# Patient Record
Sex: Male | Born: 1935 | Race: White | Hispanic: No | Smoking: Former smoker
Health system: Southern US, Community
[De-identification: ages and names within clinical notes are randomized; demographics above are authoritative.]

## PROBLEM LIST (undated history)

## (undated) DIAGNOSIS — G43909 Migraine, unspecified, not intractable, without status migrainosus: Secondary | ICD-10-CM

## (undated) DIAGNOSIS — J449 Chronic obstructive pulmonary disease, unspecified: Secondary | ICD-10-CM

## (undated) DIAGNOSIS — E785 Hyperlipidemia, unspecified: Secondary | ICD-10-CM

## (undated) DIAGNOSIS — R55 Syncope and collapse: Secondary | ICD-10-CM

## (undated) DIAGNOSIS — H9190 Unspecified hearing loss, unspecified ear: Secondary | ICD-10-CM

## (undated) DIAGNOSIS — I10 Essential (primary) hypertension: Secondary | ICD-10-CM

## (undated) DIAGNOSIS — J189 Pneumonia, unspecified organism: Secondary | ICD-10-CM

## (undated) DIAGNOSIS — R5383 Other fatigue: Secondary | ICD-10-CM

## (undated) DIAGNOSIS — F32A Depression, unspecified: Secondary | ICD-10-CM

## (undated) DIAGNOSIS — F329 Major depressive disorder, single episode, unspecified: Secondary | ICD-10-CM

## (undated) DIAGNOSIS — R002 Palpitations: Secondary | ICD-10-CM

## (undated) DIAGNOSIS — Z8719 Personal history of other diseases of the digestive system: Secondary | ICD-10-CM

## (undated) DIAGNOSIS — M549 Dorsalgia, unspecified: Secondary | ICD-10-CM

## (undated) DIAGNOSIS — I4819 Other persistent atrial fibrillation: Secondary | ICD-10-CM

## (undated) DIAGNOSIS — R569 Unspecified convulsions: Secondary | ICD-10-CM

## (undated) HISTORY — DX: Personal history of other diseases of the digestive system: Z87.19

## (undated) HISTORY — PX: OTHER SURGICAL HISTORY: SHX169

## (undated) HISTORY — PX: CATARACT EXTRACTION: SUR2

## (undated) HISTORY — PX: APPENDECTOMY: SHX54

## (undated) HISTORY — PX: HERNIA REPAIR: SHX51

## (undated) HISTORY — DX: Other persistent atrial fibrillation: I48.19

## (undated) HISTORY — PX: BACK SURGERY: SHX140

## (undated) HISTORY — DX: Other fatigue: R53.83

---

## 1974-01-08 DIAGNOSIS — Z8719 Personal history of other diseases of the digestive system: Secondary | ICD-10-CM

## 1974-01-08 HISTORY — DX: Personal history of other diseases of the digestive system: Z87.19

## 1999-10-12 ENCOUNTER — Encounter: Payer: Self-pay | Admitting: Emergency Medicine

## 1999-10-12 ENCOUNTER — Emergency Department (HOSPITAL_COMMUNITY): Admission: EM | Admit: 1999-10-12 | Discharge: 1999-10-12 | Payer: Self-pay | Admitting: Emergency Medicine

## 2002-01-22 ENCOUNTER — Emergency Department (HOSPITAL_COMMUNITY): Admission: EM | Admit: 2002-01-22 | Discharge: 2002-01-22 | Payer: Self-pay | Admitting: Emergency Medicine

## 2006-02-26 ENCOUNTER — Emergency Department (HOSPITAL_COMMUNITY): Admission: EM | Admit: 2006-02-26 | Discharge: 2006-02-26 | Payer: Self-pay | Admitting: Emergency Medicine

## 2006-06-22 ENCOUNTER — Emergency Department (HOSPITAL_COMMUNITY): Admission: EM | Admit: 2006-06-22 | Discharge: 2006-06-22 | Payer: Self-pay | Admitting: Emergency Medicine

## 2007-09-09 ENCOUNTER — Emergency Department: Payer: Self-pay | Admitting: Emergency Medicine

## 2009-05-15 ENCOUNTER — Emergency Department (HOSPITAL_COMMUNITY): Admission: EM | Admit: 2009-05-15 | Discharge: 2009-05-15 | Payer: Self-pay | Admitting: Emergency Medicine

## 2009-05-17 ENCOUNTER — Inpatient Hospital Stay (HOSPITAL_COMMUNITY): Admission: EM | Admit: 2009-05-17 | Discharge: 2009-05-19 | Payer: Self-pay | Admitting: Emergency Medicine

## 2010-03-28 LAB — CBC
HCT: 34.9 % — ABNORMAL LOW (ref 39.0–52.0)
Hemoglobin: 13.8 g/dL (ref 13.0–17.0)
Hemoglobin: 11.7 g/dL — ABNORMAL LOW (ref 13.0–17.0)
MCHC: 33.5 g/dL (ref 30.0–36.0)
MCHC: 33.8 g/dL (ref 30.0–36.0)
MCV: 90.1 fL (ref 78.0–100.0)
MCV: 90.5 fL (ref 78.0–100.0)
MCV: 90.8 fL (ref 78.0–100.0)
Platelets: 140 K/uL — ABNORMAL LOW (ref 150–400)
Platelets: 156 10*3/uL (ref 150–400)
Platelets: 157 10*3/uL (ref 150–400)
RBC: 3.86 MIL/uL — ABNORMAL LOW (ref 4.22–5.81)
RDW: 13.6 % (ref 11.5–15.5)
RDW: 13.7 % (ref 11.5–15.5)
RDW: 14.1 % (ref 11.5–15.5)
RDW: 14.4 % (ref 11.5–15.5)
WBC: 6.2 K/uL (ref 4.0–10.5)

## 2010-03-28 LAB — DIFFERENTIAL
Basophils Absolute: 0 10*3/uL (ref 0.0–0.1)
Basophils Absolute: 0 K/uL (ref 0.0–0.1)
Basophils Relative: 0 % (ref 0–1)
Basophils Relative: 0 % (ref 0–1)
Eosinophils Absolute: 0 10*3/uL (ref 0.0–0.7)
Eosinophils Absolute: 0 10*3/uL (ref 0.0–0.7)
Eosinophils Absolute: 0 K/uL (ref 0.0–0.7)
Eosinophils Relative: 0 % (ref 0–5)
Eosinophils Relative: 0 % (ref 0–5)
Lymphocytes Relative: 11 % — ABNORMAL LOW (ref 12–46)
Lymphocytes Relative: 12 % (ref 12–46)
Lymphocytes Relative: 17 % (ref 12–46)
Lymphs Abs: 0.4 10*3/uL — ABNORMAL LOW (ref 0.7–4.0)
Lymphs Abs: 1 K/uL (ref 0.7–4.0)
Monocytes Absolute: 0.5 K/uL (ref 0.1–1.0)
Monocytes Relative: 9 % (ref 3–12)
Monocytes Relative: 6 % (ref 3–12)
Monocytes Relative: 7 % (ref 3–12)
Neutro Abs: 4.7 K/uL (ref 1.7–7.7)
Neutrophils Relative %: 80 % — ABNORMAL HIGH (ref 43–77)
Neutrophils Relative %: 76 % (ref 43–77)

## 2010-03-28 LAB — BLOOD GAS, ARTERIAL
Acid-base deficit: 1.7 mmol/L (ref 0.0–2.0)
Drawn by: 103701
O2 Content: 2 L/min
O2 Saturation: 97.6 %
pO2, Arterial: 107 mmHg — ABNORMAL HIGH (ref 80.0–100.0)

## 2010-03-28 LAB — BASIC METABOLIC PANEL WITH GFR
BUN: 15 mg/dL (ref 6–23)
CO2: 24 meq/L (ref 19–32)
Calcium: 8 mg/dL — ABNORMAL LOW (ref 8.4–10.5)
Chloride: 102 meq/L (ref 96–112)
Creatinine, Ser: 1.05 mg/dL (ref 0.4–1.5)
GFR calc non Af Amer: >60 mL/min
Glucose, Bld: 104 mg/dL — ABNORMAL HIGH (ref 70–99)
Potassium: 3.9 meq/L (ref 3.5–5.1)
Sodium: 135 meq/L (ref 135–145)

## 2010-03-28 LAB — CSF CELL COUNT WITH DIFFERENTIAL
RBC Count, CSF: 39 /mm3 — ABNORMAL HIGH
RBC Count, CSF: 43 /mm3 — ABNORMAL HIGH
Tube #: 4
WBC, CSF: 0 /mm3 (ref 0–5)
WBC, CSF: 1 /mm3 (ref 0–5)

## 2010-03-28 LAB — URINALYSIS, ROUTINE W REFLEX MICROSCOPIC
Glucose, UA: 100 mg/dL — AB
Hgb urine dipstick: NEGATIVE
Hgb urine dipstick: NEGATIVE
Ketones, ur: NEGATIVE mg/dL
Leukocytes, UA: NEGATIVE
Nitrite: NEGATIVE
Protein, ur: NEGATIVE mg/dL
Specific Gravity, Urine: 1.015 (ref 1.005–1.030)
Urobilinogen, UA: 1 mg/dL (ref 0.0–1.0)
Urobilinogen, UA: 0.2 mg/dL (ref 0.0–1.0)

## 2010-03-28 LAB — TOXOPLASMA ANTIBODY-IGG, CSF: Toxoplasma Gondii IgG: <0.9

## 2010-03-28 LAB — PHENYTOIN LEVEL, TOTAL
Phenytoin Lvl: 10.3 ug/mL (ref 10.0–20.0)
Phenytoin Lvl: 15.5 ug/mL (ref 10.0–20.0)

## 2010-03-28 LAB — URINE CULTURE
Colony Count: NO GROWTH
Colony Count: NO GROWTH
Culture: NO GROWTH
Culture: NO GROWTH
Culture: NO GROWTH

## 2010-03-28 LAB — CULTURE, BLOOD (ROUTINE X 2)
Culture: NO GROWTH
Culture: NO GROWTH

## 2010-03-28 LAB — COMPREHENSIVE METABOLIC PANEL
ALT: 17 U/L (ref 0–53)
AST: 26 U/L (ref 0–37)
AST: 98 U/L — ABNORMAL HIGH (ref 0–37)
Albumin: 3 g/dL — ABNORMAL LOW (ref 3.5–5.2)
Alkaline Phosphatase: 113 U/L (ref 39–117)
Alkaline Phosphatase: 128 U/L — ABNORMAL HIGH (ref 39–117)
BUN: 9 mg/dL (ref 6–23)
BUN: 12 mg/dL (ref 6–23)
Calcium: 8.5 mg/dL (ref 8.4–10.5)
Creatinine, Ser: 1.12 mg/dL (ref 0.4–1.5)
Creatinine, Ser: 1.14 mg/dL (ref 0.4–1.5)
GFR calc Af Amer: >60 mL/min (ref >60)
GFR calc non Af Amer: >60 mL/min (ref >60)
Glucose, Bld: 122 mg/dL — ABNORMAL HIGH (ref 70–99)
Potassium: 3.6 mEq/L (ref 3.5–5.1)
Potassium: 3.7 mEq/L (ref 3.5–5.1)
Total Bilirubin: 0.3 mg/dL (ref 0.3–1.2)
Total Protein: 7.3 g/dL (ref 6.0–8.3)
Total Protein: 6.1 g/dL (ref 6.0–8.3)

## 2010-03-28 LAB — CRYPTOCOCCAL ANTIGEN, CSF: Crypto Ag: NEGATIVE

## 2010-03-28 LAB — LYME DISEASE DNA BY PCR(BORRELIA BURG): Lyme Disease(B.burgdorferi)PCR: NOT DETECTED

## 2010-03-28 LAB — HEPATITIS PANEL, ACUTE
HCV Ab: NEGATIVE
Hep A IgM: NEGATIVE
Hep B C IgM: NEGATIVE
Hepatitis B Surface Ag: NEGATIVE

## 2010-03-28 LAB — LACTIC ACID, PLASMA: Lactic Acid, Venous: 1.5 mmol/L (ref 0.5–2.2)

## 2010-03-28 LAB — PROTEIN AND GLUCOSE, CSF
Glucose, CSF: 90 mg/dL — ABNORMAL HIGH (ref 43–76)
Total  Protein, CSF: 24 mg/dL (ref 15–45)

## 2010-03-28 LAB — GRAM STAIN: Gram Stain: NONE SEEN

## 2010-03-28 LAB — ROCKY MTN SPOTTED FVR AB, IGM-BLOOD: RMSF IgM: 0.21 IV (ref 0.00–0.89)

## 2010-03-28 LAB — BASIC METABOLIC PANEL: GFR calc Af Amer: >60 mL/min (ref >60)

## 2010-10-26 LAB — POCT I-STAT CREATININE: Operator id: 151321

## 2010-10-26 LAB — I-STAT 8, (EC8 V) (CONVERTED LAB)
Bicarbonate: 23.3
Glucose, Bld: 135 — ABNORMAL HIGH
Potassium: 3.6
TCO2: 24
pCO2, Ven: 33.3 — ABNORMAL LOW
pH, Ven: 7.452 — ABNORMAL HIGH

## 2011-01-18 ENCOUNTER — Emergency Department (HOSPITAL_COMMUNITY)
Admission: EM | Admit: 2011-01-18 | Discharge: 2011-01-18 | Disposition: A | Discharge status: Home / Self Care | Payer: Medicare HMO | Attending: Emergency Medicine | Admitting: Emergency Medicine

## 2011-01-18 ENCOUNTER — Encounter (HOSPITAL_COMMUNITY): Payer: Self-pay | Admitting: Emergency Medicine

## 2011-01-18 DIAGNOSIS — I10 Essential (primary) hypertension: Secondary | ICD-10-CM | POA: Insufficient documentation

## 2011-01-18 DIAGNOSIS — Z79899 Other long term (current) drug therapy: Secondary | ICD-10-CM | POA: Insufficient documentation

## 2011-01-18 DIAGNOSIS — R569 Unspecified convulsions: Secondary | ICD-10-CM

## 2011-01-18 DIAGNOSIS — R10819 Abdominal tenderness, unspecified site: Secondary | ICD-10-CM | POA: Insufficient documentation

## 2011-01-18 DIAGNOSIS — G40909 Epilepsy, unspecified, not intractable, without status epilepticus: Secondary | ICD-10-CM | POA: Insufficient documentation

## 2011-01-18 DIAGNOSIS — Z9889 Other specified postprocedural states: Secondary | ICD-10-CM | POA: Insufficient documentation

## 2011-01-18 HISTORY — DX: Essential (primary) hypertension: I10

## 2011-01-18 HISTORY — DX: Chronic obstructive pulmonary disease, unspecified: J44.9

## 2011-01-18 HISTORY — DX: Hyperlipidemia, unspecified: E78.5

## 2011-01-18 HISTORY — DX: Unspecified convulsions: R56.9

## 2011-01-18 LAB — GLUCOSE, CAPILLARY

## 2011-01-18 LAB — PHENYTOIN LEVEL, TOTAL: Phenytoin Lvl: 4.4 ug/mL — ABNORMAL LOW (ref 10.0–20.0)

## 2011-01-18 LAB — CBC
Hemoglobin: 14 g/dL (ref 13.0–17.0)
MCH: 30.4 pg (ref 26.0–34.0)
MCV: 88.9 fL (ref 78.0–100.0)
RBC: 4.6 MIL/uL (ref 4.22–5.81)

## 2011-01-18 LAB — COMPREHENSIVE METABOLIC PANEL
Alkaline Phosphatase: 75 U/L (ref 39–117)
BUN: 12 mg/dL (ref 6–23)
GFR calc Af Amer: 70 mL/min — ABNORMAL LOW (ref >90)
Glucose, Bld: 154 mg/dL — ABNORMAL HIGH (ref 70–99)
Potassium: 3.5 mEq/L (ref 3.5–5.1)
Total Protein: 7.2 g/dL (ref 6.0–8.3)

## 2011-01-18 LAB — DIFFERENTIAL
Eosinophils Absolute: 0 10*3/uL (ref 0.0–0.7)
Eosinophils Relative: 0 % (ref 0–5)
Lymphs Abs: 1 10*3/uL (ref 0.7–4.0)
Monocytes Relative: 7 % (ref 3–12)
Neutrophils Relative %: 83 % — ABNORMAL HIGH (ref 43–77)

## 2011-01-18 MED ORDER — PHENYTOIN SODIUM EXTENDED 100 MG PO CAPS
300.0000 mg | ORAL_CAPSULE | Freq: Once | ORAL | Status: AC
  Administered 2011-01-18: 300 mg via ORAL
  Filled 2011-01-18: qty 3

## 2011-01-18 MED ORDER — ONDANSETRON HCL 4 MG PO TABS: 4.0000 mg | ORAL_TABLET | Freq: Four times a day (QID) | ORAL | Status: AC

## 2011-01-18 NOTE — ED Notes (Signed)
Per ems, that seizure was witnessed by family.  Patient gets stiff with whole body with no convulsions and lastest a minute

## 2011-01-18 NOTE — ED Provider Notes (Addendum)
History     CSN: [id number]  Arrival date & time 01/18/11  4098   First MD Initiated Contact with Patient 01/18/11 1942      Chief Complaint  Patient presents with  . Seizures    (Consider location/radiation/quality/duration/timing/severity/associated sxs/prior treatment) HPI Patient presented to the emergency room after a witnessed seizure by his family. Patient was recently in the hospital and had an elective hernia repair at another medical facility. He had done well with that and was discharged home. Patient has known history of seizure disorder for which she takes Dilantin. The patient was taking some is Phenergan for nausea so that he can take is Percocet tablet. Family noticed that he had his typical aura prior to seizures and then he fell to the ground and had a tonic seizure lasting about a minute. Patient has now recovered and is back to his normal self. He denies any headache. He denies any injuries from the seizure. He has mild tenderness associated with this hernia but he otherwise is feeling well now. Past Medical History  Diagnosis Date  . COPD (chronic obstructive pulmonary disease)   . Hyperlipemia   . Seizure   . Hypertension     Past Surgical History  Procedure Date  . Hernia repair     History reviewed. No pertinent family history.  History  Substance Use Topics  . Smoking status: Former Games developer  . Smokeless tobacco: Former Neurosurgeon  . Alcohol Use: No      Review of Systems  All other systems reviewed and are negative.    Allergies  Aspirin and Codeine  Home Medications   Current Outpatient Rx  Name Route Sig Dispense Refill  . DIAZEPAM 5 MG PO TABS Oral Take 5 mg by mouth every 8 (eight) hours as needed. ANXIETY    . DOCUSATE SODIUM 100 MG PO CAPS Oral Take 100 mg by mouth 2 (two) times daily.    [name] EZETIMIBE-SIMVASTATIN 10-80 MG PO TABS Oral Take 1 tablet by mouth at bedtime.    [name] HYDROCHLOROTHIAZIDE 25 MG PO TABS Oral Take 25 mg by mouth daily.     [name] MIRTAZAPINE 15 MG PO TABS Oral Take 15 mg by mouth at bedtime.    . OXYCODONE HCL 5 MG PO TABS Oral Take 5 mg by mouth every 4 (four) hours as needed. PAIN    . PHENYTOIN SODIUM EXTENDED 100 MG PO CAPS Oral Take 100 mg by mouth daily.    [name] PREGABALIN 75 MG PO CAPS Oral Take 75 mg by mouth 2 (two) times daily.    [name] TIOTROPIUM BROMIDE MONOHYDRATE 18 MCG IN CAPS Inhalation Place 18 mcg into inhaler and inhale daily.    [name] ONDANSETRON HCL 4 MG PO TABS Oral Take 1 tablet (4 mg total) by mouth every 6 (six) hours. 12 tablet 0    BP 151/65  Pulse 83  Temp(Src) 99.8 F (37.7 C) (Oral)  Resp 20  SpO2 96%  Physical Exam  Nursing note and vitals reviewed. Constitutional: He appears well-developed and well-nourished. No distress.  HENT:  Head: Normocephalic and atraumatic.  Right Ear: External ear normal.  Left Ear: External ear normal.  Eyes: Conjunctivae are normal. Right eye exhibits no discharge. Left eye exhibits no discharge. No scleral icterus.  Neck: Neck supple. No tracheal deviation present.  Cardiovascular: Normal rate, regular rhythm and intact distal pulses.   Pulmonary/Chest: Effort normal and breath sounds normal. No stridor. No respiratory distress. He has no wheezes. He has no  rales.  Abdominal: Soft. Bowel sounds are normal. He exhibits no distension. There is tenderness (tenderness around his right inguinal incision, no erythema no discharge). There is no rebound and no guarding.  Musculoskeletal: He exhibits no edema and no tenderness.  Neurological: He is alert. He has normal strength. No sensory deficit. Cranial nerve deficit:  no gross defecits noted. He exhibits normal muscle tone. He displays no seizure activity. Coordination normal.  Skin: Skin is warm and dry. No rash noted.  Psychiatric: He has a normal mood and affect.    ED Course  Procedures (including critical care time)  Medications  hydrochlorothiazide (HYDRODIURIL) 25 MG tablet (not administered)    ezetimibe-simvastatin (VYTORIN) 10-80 MG per tablet (not administered)  phenytoin (DILANTIN) 100 MG ER capsule (not administered)  oxyCODONE (OXY IR/ROXICODONE) 5 MG immediate release tablet (not administered)  pregabalin (LYRICA) 75 MG capsule (not administered)  mirtazapine (REMERON) 15 MG tablet (not administered)  diazepam (VALIUM) 5 MG tablet (not administered)  docusate sodium (COLACE) 100 MG capsule (not administered)  tiotropium (SPIRIVA) 18 MCG inhalation capsule (not administered)  ondansetron (ZOFRAN) 4 MG tablet (not administered)  phenytoin (DILANTIN) ER capsule 300 mg (not administered)    Labs Reviewed  CBC - Abnormal; Notable for the following:    WBC 11.0 (*)    All other components within normal limits  DIFFERENTIAL - Abnormal; Notable for the following:    Neutrophils Relative 83 (*)    Neutro Abs 9.1 (*)    Lymphocytes Relative 10 (*)    All other components within normal limits  COMPREHENSIVE METABOLIC PANEL - Abnormal; Notable for the following:    Glucose, Bld 154 (*)    Albumin 3.3 (*)    GFR calc non Af Amer 61 (*)    GFR calc Af Amer 70 (*)    All other components within normal limits  PHENYTOIN LEVEL, TOTAL - Abnormal; Notable for the following:    Phenytoin Lvl 4.4 (*)    All other components within normal limits  GLUCOSE, CAPILLARY - Abnormal; Notable for the following:    Glucose-Capillary 124 (*)    All other components within normal limits  POCT CBG MONITORING   No results found.   1. Seizure       MDM  Patient has known seizure disorder. His Dilantin level is slightly subtherapeutic. I will give additional dose of Dilantin here in the emergency department. Patient is feeling better now and would like to go home. His family is here with them and they will monitor him closely.  I will also have him discontinue his Phenergan and provided a prescription for Zofran the Phenergan could have been a contributing factor to  seizure.        Celene Kras, MD 01/18/11 1610  Celene Kras, MD 01/18/11 [phone]

## 2011-01-18 NOTE — ED Notes (Signed)
[id number]<AV> Expected date:01/18/11<BR> Expected time: 6:58 PM<BR> Means of arrival:Ambulance<BR> Comments:<BR> EMS 70 GC - seizure

## 2011-01-18 NOTE — ED Notes (Signed)
Per ems, the patient took at home prior to  Seizure - oxycodone and phenergan and then took a valium after the seizure this evening.

## 2011-01-18 NOTE — ED Notes (Signed)
Patient stable upon discharge.  Patient stable at this time

## 2011-05-17 ENCOUNTER — Emergency Department (HOSPITAL_COMMUNITY): Payer: Medicare HMO

## 2011-05-17 ENCOUNTER — Emergency Department (HOSPITAL_COMMUNITY)
Admission: EM | Admit: 2011-05-17 | Discharge: 2011-05-17 | Disposition: A | Discharge status: Home / Self Care | Payer: Medicare HMO | Attending: Emergency Medicine | Admitting: Emergency Medicine

## 2011-05-17 ENCOUNTER — Encounter (HOSPITAL_COMMUNITY): Payer: Self-pay | Admitting: Emergency Medicine

## 2011-05-17 DIAGNOSIS — E876 Hypokalemia: Secondary | ICD-10-CM | POA: Insufficient documentation

## 2011-05-17 DIAGNOSIS — R569 Unspecified convulsions: Secondary | ICD-10-CM

## 2011-05-17 DIAGNOSIS — Z79899 Other long term (current) drug therapy: Secondary | ICD-10-CM | POA: Insufficient documentation

## 2011-05-17 DIAGNOSIS — R112 Nausea with vomiting, unspecified: Secondary | ICD-10-CM | POA: Insufficient documentation

## 2011-05-17 DIAGNOSIS — E785 Hyperlipidemia, unspecified: Secondary | ICD-10-CM | POA: Insufficient documentation

## 2011-05-17 DIAGNOSIS — R51 Headache: Secondary | ICD-10-CM | POA: Insufficient documentation

## 2011-05-17 DIAGNOSIS — G40909 Epilepsy, unspecified, not intractable, without status epilepticus: Secondary | ICD-10-CM | POA: Insufficient documentation

## 2011-05-17 DIAGNOSIS — I10 Essential (primary) hypertension: Secondary | ICD-10-CM | POA: Insufficient documentation

## 2011-05-17 LAB — BASIC METABOLIC PANEL
BUN: 20 mg/dL (ref 6–23)
CO2: 24 mEq/L (ref 19–32)
Chloride: 101 mEq/L (ref 96–112)
Creatinine, Ser: 1.16 mg/dL (ref 0.50–1.35)
Glucose, Bld: 113 mg/dL — ABNORMAL HIGH (ref 70–99)

## 2011-05-17 LAB — CBC
HCT: 42.5 % (ref 39.0–52.0)
MCH: 30.9 pg (ref 26.0–34.0)
MCHC: 34.8 g/dL (ref 30.0–36.0)
MCV: 88.7 fL (ref 78.0–100.0)
RDW: 13.6 % (ref 11.5–15.5)

## 2011-05-17 MED ORDER — LOPERAMIDE HCL 2 MG PO CAPS
2.0000 mg | ORAL_CAPSULE | Freq: Once | ORAL | Status: AC
  Administered 2011-05-17: 2 mg via ORAL
  Filled 2011-05-17: qty 1

## 2011-05-17 MED ORDER — ONDANSETRON HCL 4 MG/2ML IJ SOLN
INTRAMUSCULAR | Status: AC
  Administered 2011-05-17: 02:00:00
  Filled 2011-05-17: qty 2

## 2011-05-17 MED ORDER — POTASSIUM CHLORIDE 20 MEQ/15ML (10%) PO LIQD
ORAL | Status: AC
  Administered 2011-05-17: 40 meq
  Filled 2011-05-17: qty 30

## 2011-05-17 MED ORDER — POTASSIUM CHLORIDE CRYS ER 20 MEQ PO TBCR: 40.0000 meq | EXTENDED_RELEASE_TABLET | Freq: Once | ORAL | Status: DC

## 2011-05-17 MED ORDER — PHENYTOIN SODIUM EXTENDED 100 MG PO CAPS
300.0000 mg | ORAL_CAPSULE | Freq: Once | ORAL | Status: AC
  Administered 2011-05-17: 300 mg via ORAL
  Filled 2011-05-17: qty 3

## 2011-05-17 MED ORDER — SODIUM CHLORIDE 0.9 % IV BOLUS (SEPSIS)
1000.0000 mL | Freq: Once | INTRAVENOUS | Status: AC
  Administered 2011-05-17: 1000 mL via INTRAVENOUS

## 2011-05-17 MED ORDER — LORAZEPAM 2 MG/ML IJ SOLN
2.0000 mg | Freq: Once | INTRAMUSCULAR | Status: AC
  Administered 2011-05-17: 2 mg via INTRAVENOUS

## 2011-05-17 MED ORDER — LORAZEPAM 2 MG/ML IJ SOLN
2.0000 mg | Freq: Once | INTRAMUSCULAR | Status: DC
  Filled 2011-05-17: qty 1

## 2011-05-17 NOTE — ED Notes (Signed)
[id number]<AV> Expected date:05/17/11<BR> Expected time: 1:50 AM<BR> Means of arrival:Ambulance<BR> Comments:<BR> Vomiting, seizures

## 2011-05-17 NOTE — ED Notes (Signed)
Pt was in bed, woke up feeling bad and had 3 seizures. Seizures were reportedly full body for approx. 10 sec each. Pt was not post ictyl. N/V/D on scene. 4mg  of Zofran given in truck. 250cc NSL. LFA 20g IV. Hx of seizures and same like activity per wife. CBG 133.

## 2011-05-17 NOTE — Discharge Instructions (Signed)

## 2011-05-17 NOTE — ED Notes (Signed)
Pt states his eyes have pressure behind them and his head hurts which is not normal for him. They also stated that sometimes when he "over does it" or is sick before hand he is prone to seizing.

## 2011-05-17 NOTE — ED Provider Notes (Signed)
History     CSN: [id number]  Arrival date & time 05/17/11  0217   First MD Initiated Contact with Patient 05/17/11 [phone]      Chief Complaint  Patient presents with  . Seizures  . Emesis  . Diarrhea    (Consider location/radiation/quality/duration/timing/severity/associated sxs/prior treatment) HPI The patient presents with concerns over ongoing seizure activity.  He notes that he has been in his usual state of health.  Approximately 4 hours ago the patient woke up with nausea, vomiting.  He subsequently experienced the first of 6 seizures.  Typically the patient's program does consist of nausea, but he's capable of taking preemptive benzodiazepine.  Types with case, the patient was by mouth intolerance.  Seizures have been witnessed by multiple family members.  Per EMS the patient had seizure activity in route.  On arrival the patient had another seizure-like activity.  On my exam the patient denies confusion, disorientation, does endorse new, atypical pain in his head.  The pain is focally about the posterior orbital areas, throbbing, worse with any motion.  No concurrent nausea with head motion. Notably, the patient has been in his usual state of health, compliant with all medications, and he has no other ongoing medical concerns. Past Medical History  Diagnosis Date  . COPD (chronic obstructive pulmonary disease)   . Hyperlipemia   . Seizure   . Hypertension     Past Surgical History  Procedure Date  . Hernia repair     History reviewed. No pertinent family history.  History  Substance Use Topics  . Smoking status: Former Games developer  . Smokeless tobacco: Former Neurosurgeon  . Alcohol Use: No      Review of Systems  Gastrointestinal: Positive for vomiting. Negative for diarrhea.    Allergies  Aspirin; Codeine; and Oxycodone  Home Medications   Current Outpatient Rx  Name Route Sig Dispense Refill  . AMLODIPINE BESYLATE 10 MG PO TABS Oral Take 10 mg by mouth daily.    [name]  DIAZEPAM 5 MG PO TABS Oral Take 5 mg by mouth every 8 (eight) hours as needed. ANXIETY    . DOCUSATE SODIUM 100 MG PO CAPS Oral Take 100 mg by mouth 2 (two) times daily as needed. FOR STOOL SOFTNER    . EZETIMIBE-SIMVASTATIN 10-80 MG PO TABS Oral Take 1 tablet by mouth every evening.     [name] HYDROCHLOROTHIAZIDE 25 MG PO TABS Oral Take 25 mg by mouth daily.    [name] MIRTAZAPINE 15 MG PO TABS Oral Take 15 mg by mouth at bedtime.    [name] PREGABALIN 75 MG PO CAPS Oral Take 75 mg by mouth 2 (two) times daily.    [name] TIOTROPIUM BROMIDE MONOHYDRATE 18 MCG IN CAPS Inhalation Place 18 mcg into inhaler and inhale daily.      BP 134/65  Pulse 80  Temp(Src) 97.5 F (36.4 C) (Oral)  Resp 14  SpO2 100%  Physical Exam  Nursing note and vitals reviewed. Constitutional: He is oriented to person, place, and time. He appears well-developed. No distress.  HENT:  Head: Normocephalic and atraumatic.  Eyes: Conjunctivae and EOM are normal.  Cardiovascular: Normal rate and regular rhythm.   Pulmonary/Chest: Effort normal. No stridor. No respiratory distress.  Abdominal: He exhibits no distension.  Musculoskeletal: He exhibits no edema.  Neurological: He is alert and oriented to person, place, and time.  Skin: Skin is warm and dry.  Psychiatric: He has a normal mood and affect.    ED Course  Procedures (  including critical care time)  Labs Reviewed  CBC - Abnormal; Notable for the following:    WBC 14.6 (*)    All other components within normal limits  PHENYTOIN LEVEL, TOTAL  BASIC METABOLIC PANEL   No results found.   No diagnosis found.  Cardiac 80 sinus rhythm normal Pulse oximetry 100% [place] abnormal  6:50 AM The patient notes that he is feeling significantly improved.  No further seizure activity. MDM  This elderly male with history of seizures now presents following multiple witnessed seizures, and several episodes of vomiting and diarrhea.  On my initial exam the patient is not post ictal, is  comfortable, though tired appearing.  The patient's labs are notable for demonstration of hypokalemia and a subtherapeutic Dilantin level.  I discussed all findings with the patient and his family.  Following provision of potassium supplements, the patient noted he felt significantly better.  The patient was provided Ativan and Dilantin loading dose.  Given the otherwise reassuring evaluation, the patient's coaptation family members, he was discharged in stable condition with explicit instructions to take his medication as directed, time I will recheck properly and to follow up with his primary care physician as soon as possible.     Gerhard Munch, MD 05/17/11 [phone]

## 2011-05-17 NOTE — ED Notes (Signed)
Patient transported to CT 

## 2013-11-23 ENCOUNTER — Emergency Department: Payer: Self-pay | Admitting: Emergency Medicine

## 2013-11-23 LAB — BASIC METABOLIC PANEL
ANION GAP: 8 (ref 7–16)
BUN: 17 mg/dL (ref 7–18)
Calcium, Total: 8.4 mg/dL — ABNORMAL LOW (ref 8.5–10.1)
Chloride: 101 mmol/L (ref 98–107)
Co2: 32 mmol/L (ref 21–32)
Creatinine: 1.16 mg/dL (ref 0.60–1.30)
EGFR (African American): >60
GLUCOSE: 94 mg/dL (ref 65–99)
Osmolality: 283 (ref 275–301)
Potassium: 3.2 mmol/L — ABNORMAL LOW (ref 3.5–5.1)
Sodium: 141 mmol/L (ref 136–145)

## 2013-11-23 LAB — CBC
HCT: 45.4 % (ref 40.0–52.0)
HGB: 15.3 g/dL (ref 13.0–18.0)
MCH: 31.8 pg (ref 26.0–34.0)
MCHC: 33.7 g/dL (ref 32.0–36.0)
MCV: 95 fL (ref 80–100)
Platelet: 298 10*3/uL (ref 150–440)
RBC: 4.8 10*6/uL (ref 4.40–5.90)
RDW: 14.9 % — AB (ref 11.5–14.5)
WBC: 7.1 10*3/uL (ref 3.8–10.6)

## 2014-04-12 ENCOUNTER — Encounter (HOSPITAL_COMMUNITY): Payer: Self-pay | Admitting: Emergency Medicine

## 2014-04-12 ENCOUNTER — Emergency Department (HOSPITAL_COMMUNITY): Payer: Commercial Managed Care - HMO

## 2014-04-12 ENCOUNTER — Emergency Department (HOSPITAL_COMMUNITY)
Admission: EM | Admit: 2014-04-12 | Discharge: 2014-04-13 | Disposition: A | Discharge status: Home / Self Care | Payer: Commercial Managed Care - HMO | Attending: Emergency Medicine | Admitting: Emergency Medicine

## 2014-04-12 DIAGNOSIS — S20212A Contusion of left front wall of thorax, initial encounter: Secondary | ICD-10-CM

## 2014-04-12 DIAGNOSIS — G40909 Epilepsy, unspecified, not intractable, without status epilepticus: Secondary | ICD-10-CM | POA: Insufficient documentation

## 2014-04-12 DIAGNOSIS — S3991XA Unspecified injury of abdomen, initial encounter: Secondary | ICD-10-CM | POA: Diagnosis not present

## 2014-04-12 DIAGNOSIS — S21102A Unspecified open wound of left front wall of thorax without penetration into thoracic cavity, initial encounter: Secondary | ICD-10-CM

## 2014-04-12 DIAGNOSIS — E785 Hyperlipidemia, unspecified: Secondary | ICD-10-CM | POA: Insufficient documentation

## 2014-04-12 DIAGNOSIS — Z79899 Other long term (current) drug therapy: Secondary | ICD-10-CM | POA: Diagnosis not present

## 2014-04-12 DIAGNOSIS — Z87891 Personal history of nicotine dependence: Secondary | ICD-10-CM | POA: Insufficient documentation

## 2014-04-12 DIAGNOSIS — W208XXA Other cause of strike by thrown, projected or falling object, initial encounter: Secondary | ICD-10-CM | POA: Diagnosis not present

## 2014-04-12 DIAGNOSIS — J441 Chronic obstructive pulmonary disease with (acute) exacerbation: Secondary | ICD-10-CM | POA: Diagnosis not present

## 2014-04-12 DIAGNOSIS — Y9289 Other specified places as the place of occurrence of the external cause: Secondary | ICD-10-CM | POA: Diagnosis not present

## 2014-04-12 DIAGNOSIS — Y9389 Activity, other specified: Secondary | ICD-10-CM | POA: Diagnosis not present

## 2014-04-12 DIAGNOSIS — Y998 Other external cause status: Secondary | ICD-10-CM | POA: Diagnosis not present

## 2014-04-12 DIAGNOSIS — S29001A Unspecified injury of muscle and tendon of front wall of thorax, initial encounter: Secondary | ICD-10-CM | POA: Diagnosis present

## 2014-04-12 DIAGNOSIS — I10 Essential (primary) hypertension: Secondary | ICD-10-CM | POA: Insufficient documentation

## 2014-04-12 LAB — CBC WITH DIFFERENTIAL/PLATELET
Basophils Absolute: 0.1 K/uL (ref 0.0–0.1)
Basophils Relative: 2 % — ABNORMAL HIGH (ref 0–1)
Eosinophils Absolute: 0.1 K/uL (ref 0.0–0.7)
Eosinophils Relative: 1 % (ref 0–5)
HCT: 38.6 % — ABNORMAL LOW (ref 39.0–52.0)
Hemoglobin: 13.4 g/dL (ref 13.0–17.0)
Lymphocytes Relative: 34 % (ref 12–46)
Lymphs Abs: 1.7 K/uL (ref 0.7–4.0)
MCH: 31.4 pg (ref 26.0–34.0)
MCHC: 34.7 g/dL (ref 30.0–36.0)
MCV: 90.4 fL (ref 78.0–100.0)
Monocytes Absolute: 0.5 K/uL (ref 0.1–1.0)
Monocytes Relative: 10 % (ref 3–12)
Neutro Abs: 2.6 K/uL (ref 1.7–7.7)
Neutrophils Relative %: 53 % (ref 43–77)
Platelets: 268 K/uL (ref 150–400)
RBC: 4.27 MIL/uL (ref 4.22–5.81)
RDW: 13.6 % (ref 11.5–15.5)
WBC: 4.9 K/uL (ref 4.0–10.5)

## 2014-04-12 LAB — I-STAT CHEM 8, ED
BUN: 19 mg/dL (ref 6–23)
Calcium, Ion: 1.11 mmol/L — ABNORMAL LOW (ref 1.13–1.30)
Chloride: 98 mmol/L (ref 96–112)
Creatinine, Ser: 1.2 mg/dL (ref 0.50–1.35)
Glucose, Bld: 125 mg/dL — ABNORMAL HIGH (ref 70–99)
HCT: 42 % (ref 39.0–52.0)
Hemoglobin: 14.3 g/dL (ref 13.0–17.0)
Potassium: 3.3 mmol/L — ABNORMAL LOW (ref 3.5–5.1)
Sodium: 139 mmol/L (ref 135–145)
TCO2: 24 mmol/L (ref 0–100)

## 2014-04-12 MED ORDER — FENTANYL CITRATE 0.05 MG/ML IJ SOLN
50.0000 ug | Freq: Once | INTRAMUSCULAR | Status: AC
  Administered 2014-04-12: 50 ug via INTRAVENOUS
  Filled 2014-04-12: qty 2

## 2014-04-12 MED ORDER — IOHEXOL 300 MG/ML  SOLN: 25.0000 mL | INTRAMUSCULAR | Status: AC

## 2014-04-12 NOTE — ED Provider Notes (Signed)
CSN: [id number]     Arrival date & time 04/12/14  2031 History   First MD Initiated Contact with Patient 04/12/14 2036     Chief Complaint  Patient presents with  . Abdominal Injury     (Consider location/radiation/quality/duration/timing/severity/associated sxs/prior Treatment) HPI Zachary Peterson is a 81 y.o. male with history of COPD, seizure, hypertension, states he was piling was earlier today when top of the pile of rolled down and hit him on the back. Patient states approximately 200 pounds of what hit him on the back. He states it knocked him down he fell forward onto laws ovoid, injuring his left ribs. Patient states that he is having severe pain in the left upper abdomen and left lower ribs. States he is having pain with breathing. Denies any nausea or vomiting. Denies head injury. Denies any trouble urinating. Denies any pain in the pelvis or his legs. Patient is ambulatory.  Past Medical History  Diagnosis Date  . COPD (chronic obstructive pulmonary disease)   . Hyperlipemia   . Seizure   . Hypertension    Past Surgical History  Procedure Laterality Date  . Hernia repair     History reviewed. No pertinent family history. History  Substance Use Topics  . Smoking status: Former Games developer  . Smokeless tobacco: Former Neurosurgeon  . Alcohol Use: No    Review of Systems  Respiratory: Positive for chest tightness and shortness of breath.   Cardiovascular: Positive for chest pain.  Gastrointestinal: Positive for abdominal pain. Negative for nausea and vomiting.  Musculoskeletal: Positive for back pain.  All other systems reviewed and are negative.     Allergies  Aspirin; Codeine; and Oxycodone  Home Medications   Prior to Admission medications   Medication Sig Start Date End Date Taking? Authorizing Provider  amLODipine (NORVASC) 10 MG tablet Take 10 mg by mouth daily.    Historical Provider, MD  diazepam (VALIUM) 5 MG tablet Take 5 mg by mouth every 8 (eight) hours as  needed. ANXIETY    Historical Provider, MD  docusate sodium (COLACE) 100 MG capsule Take 100 mg by mouth 2 (two) times daily as needed. FOR STOOL SOFTNER    Historical Provider, MD  ezetimibe-simvastatin (VYTORIN) 10-80 MG per tablet Take 1 tablet by mouth every evening.     Historical Provider, MD  hydrochlorothiazide (HYDRODIURIL) 25 MG tablet Take 25 mg by mouth daily.    Historical Provider, MD  mirtazapine (REMERON) 15 MG tablet Take 15 mg by mouth at bedtime.    Historical Provider, MD  pregabalin (LYRICA) 75 MG capsule Take 75 mg by mouth 2 (two) times daily.    Historical Provider, MD  tiotropium (SPIRIVA) 18 MCG inhalation capsule Place 18 mcg into inhaler and inhale daily.    Historical Provider, MD   BP 119/63 mmHg  Pulse 76  Temp(Src) 98.5 F (36.9 C)  Resp 15  SpO2 95% Physical Exam  Constitutional: He is oriented to person, place, and time. He appears well-developed and well-nourished. No distress.  HENT:  Head: Normocephalic and atraumatic.  Eyes: Conjunctivae are normal.  Neck: Neck supple.  No midline tenderness to palpation  Cardiovascular: Normal rate, regular rhythm and normal heart sounds.   Pulmonary/Chest: Effort normal. No respiratory distress. He has no wheezes. He has no rales. He exhibits tenderness.  Tender to palpation of the left lower ribs over anterior chest.  Abdominal: Soft. Bowel sounds are normal. He exhibits no distension. There is no tenderness. There is no rebound.  Tenderness to the left upper quadrant, no guarding, no bruising.  Musculoskeletal: He exhibits no edema.  Neurological: He is alert and oriented to person, place, and time. No cranial nerve deficit. Coordination normal.  Skin: Skin is warm and dry.  Nursing note and vitals reviewed.   ED Course  Procedures (including critical care time) Labs Review Labs Reviewed  CBC WITH DIFFERENTIAL/PLATELET - Abnormal; Notable for the following:    HCT 38.6 (*)    Basophils Relative 2 (*)     All other components within normal limits  I-STAT CHEM 8, ED - Abnormal; Notable for the following:    Potassium 3.3 (*)    Glucose, Bld 125 (*)    Calcium, Ion 1.11 (*)    All other components within normal limits    Imaging Review Ct Chest W Contrast  04/13/2014   CLINICAL DATA:  Abdominal injury. Patient was chopping wood outside is home and a stack of wood fell over and landed on the patient. Left chest and abdomen pain.  EXAM: CT CHEST, ABDOMEN, AND PELVIS WITH CONTRAST  TECHNIQUE: Multidetector CT imaging of the chest, abdomen and pelvis was performed following the standard protocol during bolus administration of intravenous contrast.  CONTRAST:  100mL OMNIPAQUE IOHEXOL 300 MG/ML  SOLN  COMPARISON:  None.  FINDINGS: CT CHEST FINDINGS  Normal heart size. Normal caliber thoracic aorta. Great vessel origins are patent. Calcification in the aorta and Coronary arteries. Central pulmonary arteries are well opacified without evidence of significant central pulmonary embolus. Esophagus is decompressed. Small esophageal hiatal hernia. No significant lymphadenopathy in the chest.  Emphysematous changes and scattered fibrosis in the lungs. No focal airspace disease or consolidation. Few scattered calcified granulomas. Mild scarring in the lung apices. No pneumothorax. No pleural effusions. Airways appear patent. There is a nodule in the superior segment of the right lower lung measuring 4 mm in diameter. If the patient is at high risk for bronchogenic carcinoma, follow-up chest CT at 1 year is recommended. If the patient is at low risk, no follow-up is needed. This recommendation follows the consensus statement: Guidelines for Management of Small Pulmonary Nodules Detected on CT Scans: A Statement from the Fleischner Society as published in Radiology 2005; 237:395-400.  CT ABDOMEN AND PELVIS FINDINGS  Mild diffuse fatty infiltration in the liver. Cholelithiasis with multiple stones in the gallbladder. No  gallbladder wall thickening or edema. No bile duct dilatation. The spleen, pancreas, adrenal glands, kidneys, inferior vena cava, and retroperitoneal lymph nodes are unremarkable. Calcification of abdominal aorta without aneurysm. Stomach is not abnormally distended. The second-third portion of the duodenum demonstrates heterogeneous appearance with suggestion of irregular wall thickening. Appearance may indicate duodenal hematoma. Small bowel are unremarkable. Stool-filled colon without abnormal distention or wall thickening. No free or loculated pelvic fluid collections. No abnormal mesenteric fluid collections.  Pelvis: Bladder is distended without wall thickening. Prostate gland is diffusely enlarged with calcification. No free or loculated pelvic fluid collections. No pelvic mass or lymphadenopathy. Appendix is not identified. Postoperative changes consistent with hernia repairs.  Bones: Normal alignment of the thoracic and lumbar spine. Degenerative changes in the lumbar region with degenerative disc disease at L2-3, L3-4, and L4-5 levels. Schmorl's noted L3 superior endplate. Posterior elements appear intact. No acute displaced rib fractures identified. Sternum is nondepressed. Sacrum, pelvis, and hips appear intact.  IMPRESSION: Chest: No acute posttraumatic changes. Emphysematous changes in the lungs. 4 mm nodule in the superior segment right lower lung.  Abdomen: Diffuse fatty infiltration of the  liver. Distension and heterogeneous appearance of the second/ third portion the duodenum suggesting possible duodenal hematoma. No bowel obstruction or mesenteric injury. No evidence of solid organ injury or bowel perforation.   Electronically Signed   By: Burman Nieves M.D.   On: 04/13/2014 00:57   Ct Abdomen Pelvis W Contrast  04/13/2014   CLINICAL DATA:  Abdominal injury. Patient was chopping wood outside is home and a stack of wood fell over and landed on the patient. Left chest and abdomen pain.  EXAM:  CT CHEST, ABDOMEN, AND PELVIS WITH CONTRAST  TECHNIQUE: Multidetector CT imaging of the chest, abdomen and pelvis was performed following the standard protocol during bolus administration of intravenous contrast.  CONTRAST:  OMNIPAQUE IOHEXOL 300 MG/ML  SOLN  COMPARISON:  None.  FINDINGS: CT CHEST FINDINGS  Normal heart size. Normal caliber thoracic aorta. Great vessel origins are patent. Calcification in the aorta and Coronary arteries. Central pulmonary arteries are well opacified without evidence of significant central pulmonary embolus. Esophagus is decompressed. Small esophageal hiatal hernia. No significant lymphadenopathy in the chest.  Emphysematous changes and scattered fibrosis in the lungs. No focal airspace disease or consolidation. Few scattered calcified granulomas. Mild scarring in the lung apices. No pneumothorax. No pleural effusions. Airways appear patent. There is a nodule in the superior segment of the right lower lung measuring 4 mm in diameter. If the patient is at high risk for bronchogenic carcinoma, follow-up chest CT at 1 year is recommended. If the patient is at low risk, no follow-up is needed. This recommendation follows the consensus statement: Guidelines for Management of Small Pulmonary Nodules Detected on CT Scans: A Statement from the Fleischner Society as published in Radiology 2005; 237:395-400.  CT ABDOMEN AND PELVIS FINDINGS  Mild diffuse fatty infiltration in the liver. Cholelithiasis with multiple stones in the gallbladder. No gallbladder wall thickening or edema. No bile duct dilatation. The spleen, pancreas, adrenal glands, kidneys, inferior vena cava, and retroperitoneal lymph nodes are unremarkable. Calcification of abdominal aorta without aneurysm. Stomach is not abnormally distended. The second-third portion of the duodenum demonstrates heterogeneous appearance with suggestion of irregular wall thickening. Appearance may indicate duodenal hematoma. Small bowel are  unremarkable. Stool-filled colon without abnormal distention or wall thickening. No free or loculated pelvic fluid collections. No abnormal mesenteric fluid collections.  Pelvis: Bladder is distended without wall thickening. Prostate gland is diffusely enlarged with calcification. No free or loculated pelvic fluid collections. No pelvic mass or lymphadenopathy. Appendix is not identified. Postoperative changes consistent with hernia repairs.  Bones: Normal alignment of the thoracic and lumbar spine. Degenerative changes in the lumbar region with degenerative disc disease at L2-3, L3-4, and L4-5 levels. Schmorl's noted L3 superior endplate. Posterior elements appear intact. No acute displaced rib fractures identified. Sternum is nondepressed. Sacrum, pelvis, and hips appear intact.  IMPRESSION: Chest: No acute posttraumatic changes. Emphysematous changes in the lungs. 4 mm nodule in the superior segment right lower lung.  Abdomen: Diffuse fatty infiltration of the liver. Distension and heterogeneous appearance of the second/ third portion the duodenum suggesting possible duodenal hematoma. No bowel obstruction or mesenteric injury. No evidence of solid organ injury or bowel perforation.   Electronically Signed   By: Burman Nieves M.D.   On: 04/13/2014 00:57   Dg Chest Portable 1 View  04/12/2014   CLINICAL DATA:  Fall with left-sided chest pain.  Abdominal injury.  EXAM: PORTABLE CHEST - 1 VIEW  COMPARISON:  05/17/2009  FINDINGS: Increased markings at the left base,  suspect mild atelectasis. There is hyperinflation and apical emphysematous change with symmetric biapical pleural thickening. No appreciable fracture. When accounting for skin folds on the left, no pneumothorax. Normal heart size and aortic contours.  IMPRESSION: 1. Mild atelectasis at the left base. 2. COPD.   Electronically Signed   By: Marnee Spring M.D.   On: 04/12/2014 21:35     EKG Interpretation   Date/Time:  Monday April 12 2014  20:44:24 EDT Ventricular Rate:  81 PR Interval:  201 QRS Duration: 108 QT Interval:  401 QTC Calculation: 465 R Axis:   49 Text Interpretation:  Sinus rhythm RSR' in V1 or V2, right VCD or RVH No  significant change since last tracing Confirmed by HARRISON  MD, FORREST  ([id number]) on 04/12/2014 9:03:53 PM      MDM   Final diagnoses:  Rib contusion, left, initial encounter     patient is here after he was hit in the back left of hilum with and after landing on a ball and hitting left ribs. Patient complaining of severe pain to the left lower ribs. Mild tenderness to the left upper abdomen and exam. Patient received 100 mcg of fentanyl per ems, feeling better.   10:30 PM Delay in CT, due to how busy ED is. Pt and family informed. Pt's labs unremarkable. VS normal. Pt stable. Hungry.   2:05 AM Pt's CT showing possible deudenal hematoma. Pt reassessed. No abdominal tenderness on exam at this time. Discussed with Dr. Derrell Lolling, on call for trauma, who advised if pt not having any vomiting, and no severe abdominal pain with given mechanism of injury, most likely not hematoma. I offered pt and his family admission for observation and pain management however pt refused wanting to go home. He was able to ambulate in the hallway with minimal assist although somewhat drowsy. Pt's family at bedside, agree with plan to d/c and will take him home where he will be with his wife. Return precautions carefully discussed including to return immediately if worsening abdominal pain, shortness of breath, nausea, vomiting. Home with norco, zofran. Close follow up with pcp.   Filed Vitals:   04/12/14 2345 04/13/14 0000 04/13/14 0145 04/13/14 0151  BP: 123/62 109/89 126/65   Pulse: 64 63  75  Temp:      Resp:    12  SpO2: 96% 98%  93%     Jaynie Crumble, PA-C 04/13/14 0211  Purvis Sheffield, MD 04/14/14 0005

## 2014-04-12 NOTE — ED Notes (Signed)
Per EMS: Pt from home attempted to get wood from wood pile and the pile fell over onto him.  Pt tried to step to side and fell, c/o left upper quadrant pain and having a harder time breathing.  Pt reports left leg is tender.  Lung sounds clear, EMS clears [facility]. Pt alert and oriented.

## 2014-04-12 NOTE — ED Notes (Signed)
Called CT to notify that pt was ready to go to Ct.

## 2014-04-13 ENCOUNTER — Encounter (HOSPITAL_COMMUNITY): Payer: Self-pay

## 2014-04-13 ENCOUNTER — Emergency Department (HOSPITAL_COMMUNITY): Payer: Commercial Managed Care - HMO

## 2014-04-13 MED ORDER — HYDROCODONE-ACETAMINOPHEN 5-325 MG PO TABS: 1.0000 | ORAL_TABLET | Freq: Four times a day (QID) | ORAL | Status: DC | PRN

## 2014-04-13 MED ORDER — ONDANSETRON 8 MG PO TBDP: 8.0000 mg | ORAL_TABLET | Freq: Three times a day (TID) | ORAL | Status: AC | PRN

## 2014-04-13 MED ORDER — FENTANYL CITRATE 0.05 MG/ML IJ SOLN
50.0000 ug | Freq: Once | INTRAMUSCULAR | Status: AC
  Administered 2014-04-13: 50 ug via INTRAVENOUS
  Filled 2014-04-13: qty 2

## 2014-04-13 MED ORDER — IOHEXOL 300 MG/ML  SOLN
100.0000 mL | Freq: Once | INTRAMUSCULAR | Status: AC | PRN
  Administered 2014-04-13: 100 mL via INTRAVENOUS

## 2014-04-13 NOTE — Discharge Instructions (Signed)
Take tylenol for pain, norco as prescribed as needed for severe pain. Do not take both at the same time since norco contains tylenol. Take zofran for nausea. Ice your ribs. Use spirometer every 2-3 hours throughout the day. If you develop any worsening abdominal pain, nausea, vomiting, any new concerning symptom, return to ER.    Blunt Chest Trauma Blunt chest trauma is an injury caused by a blow to the chest. These chest injuries can be very painful. Blunt chest trauma often results in bruised or broken (fractured) ribs. Most cases of bruised and fractured ribs from blunt chest traumas get better after 1 to 3 weeks of rest and pain medicine. Often, the soft tissue in the chest wall is also injured, causing pain and bruising. Internal organs, such as the heart and lungs, may also be injured. Blunt chest trauma can lead to serious medical problems. This injury requires immediate medical care. CAUSES   Motor vehicle collisions.  Falls.  Physical violence.  Sports injuries. SYMPTOMS   Chest pain. The pain may be worse when you move or breathe deeply.  Shortness of breath.  Lightheadedness.  Bruising.  Tenderness.  Swelling. DIAGNOSIS  Your caregiver will do a physical exam. X-rays may be taken to look for fractures. However, minor rib fractures may not show up on X-rays until a few days after the injury. If a more serious injury is suspected, further imaging tests may be done. This may include ultrasounds, computed tomography (CT) scans, or magnetic resonance imaging (MRI). TREATMENT  Treatment depends on the severity of your injury. Your caregiver may prescribe pain medicines and deep breathing exercises. HOME CARE INSTRUCTIONS  Limit your activities until you can move around without much pain.  Do not do any strenuous work until your injury is healed.  Put ice on the injured area.  Put ice in a plastic bag.  Place a towel between your skin and the bag.  Leave the ice on for  15-20 minutes, 03-04 times a day.  You may wear a rib belt as directed by your caregiver to reduce pain.  Practice deep breathing as directed by your caregiver to keep your lungs clear.  Only take over-the-counter or prescription medicines for pain, fever, or discomfort as directed by your caregiver. SEEK IMMEDIATE MEDICAL CARE IF:   You have increasing pain or shortness of breath.  You cough up blood.  You have nausea, vomiting, or abdominal pain.  You have a fever.  You feel dizzy, weak, or you faint. MAKE SURE YOU:  Understand these instructions.  Will watch your condition.  Will get help right away if you are not doing well or get worse. Document Released: 02/02/2004 Document Revised: 03/19/2011 Document Reviewed: 10/11/2010 [facility] Patient Information 2015 [place], [place]. This information is not intended to replace advice given to you by your health care provider. Make sure you discuss any questions you have with your health care provider.

## 2014-04-13 NOTE — ED Notes (Signed)
Pt taken to CT.

## 2014-04-13 NOTE — ED Notes (Signed)
Pt made aware to return if symptoms worsen or if any life threatening symptoms occur.   

## 2015-12-22 ENCOUNTER — Ambulatory Visit: Payer: Medicare HMO | Attending: Unknown Physician Specialty | Admitting: Physical Therapy

## 2015-12-22 ENCOUNTER — Encounter: Payer: Self-pay | Admitting: Physical Therapy

## 2015-12-22 DIAGNOSIS — M6281 Muscle weakness (generalized): Secondary | ICD-10-CM | POA: Diagnosis present

## 2015-12-22 DIAGNOSIS — M5440 Lumbago with sciatica, unspecified side: Secondary | ICD-10-CM | POA: Insufficient documentation

## 2015-12-22 DIAGNOSIS — R262 Difficulty in walking, not elsewhere classified: Secondary | ICD-10-CM

## 2015-12-22 NOTE — Therapy (Signed)
[facility] [address] [place], [place], [address] Phone: [phone]   Fax:  [phone]  Physical Therapy Evaluation  Patient Details  Name: Zachary RavelSamuel D Peterson MRN: [id number] Date of Birth: [birth year] Referring Provider: Linus SalmonsMILLER, JOSEPH K  Encounter Date: 12/22/2015      PT End of Session - 12/22/15 1120    Visit Number 1   Number of Visits 17   Date for PT Re-Evaluation 02/16/16   Authorization Type g codes   Authorization Time Period 1/10   PT Start Time 1100   PT Stop Time 1200   PT Time Calculation (min) 60 min   Activity Tolerance Patient limited by pain   Behavior During Therapy [facility] for tasks assessed/performed      Past Medical History:  Diagnosis Date  . COPD (chronic obstructive pulmonary disease) (HCC)   . Hyperlipemia   . Hypertension   . Seizure [facility](HCC)     Past Surgical History:  Procedure Laterality Date  . HERNIA REPAIR      There were no vitals filed for this visit.       Subjective Assessment - 12/22/15 1106    Subjective Patient is having back pain for 3 weeks. He does not know what he did to make it hurt. Now the pain is going down his right leg to his ankle   Patient is accompained by: Family member   Pertinent History back surgery 1980's, , high blood pressure, seizures, RA,chronic low backpain   Limitations Sitting;Lifting;Standing;Walking   How long can you sit comfortably? all day   How long can you stand comfortably? a few minutes   How long can you walk comfortably? 5 minutes   Diagnostic tests x ray   Patient Stated Goals to walk better   Currently in Pain? Yes   Pain Score 10-Worst pain ever   Pain Location Back   Pain Orientation Right   Pain Descriptors / Indicators Constant   Pain Type Chronic pain   Pain Radiating Towards RLE ankle   Pain Onset 1 to 4 weeks ago   Pain Frequency Constant   Aggravating Factors  movement, walking, standing   Effect of Pain on Daily  Activities unable to perform walking   Multiple Pain Sites Yes            [facility] Assessment - 12/22/15 0001      Assessment   Medical Diagnosis Lumbar Radiculopathy   Referring Provider Royston SinnerMILLER, JOSEPH K   Onset Date/Surgical Date 12/21/15   Hand Dominance Right   Prior Therapy 1 year ago     Precautions   Precautions None     Restrictions   Weight Bearing Restrictions No     Balance Screen   Has the patient fallen in the past 6 months No   Has the patient had a decrease in activity level because of a fear of falling?  Yes   Is the patient reluctant to leave their home because of a fear of falling?  No     Home Environment   Living Environment Private residence   Available Help at Discharge Family   Type of Home House   Home Access Stairs to enter   Entrance Stairs-Number of Steps 6   Entrance Stairs-Rails Can reach both   Home Layout One level   Home Equipment [place] - single point     Prior Function   Level of Independence Needs assistance with ADLs;Independent with household mobility without  device   Vocation Retired   Leisure outside work     Copy[employer]ognition   Overall Cognitive Status Within [employer] OneFunctional Limits for tasks assessed   Attention Focused       PAIN:10/10 to low back and RLE down to RLE  POSTURE: flexed posture   PROM/AROM:  STRENGTH:  Graded on a 0-5 scale Muscle Group Left Right                          Hip Flex 2/5 2/5  Hip Abd 2/5 2/5  Hip Add 2/5 2/5  Hip Ext 0/5 0/5  Hip IR/ER    Knee Flex 3/5 3/5  Knee Ext 3/5 3/5  Ankle DF    Ankle PF     SENSATION: numbness in RLE  SPECIAL TESTS:= spring test L1- L5 , + RLE prone flex test, + crossed SLR   FUNCTIONAL MOBILITY: guarded and poor mobility   BALANCE:unable to tandem stand and unable to single leg stand  GAIT: Poor posture with flexed trunk and slow gait speed  OUTCOME MEASURES: TEST Outcome Interpretation  5 times sit<>stand 47.35sec >60 yo, >15 sec indicates increased  risk for falls  10 meter walk test    .56            m/s <1.0 m/s indicates increased risk for falls; limited community ambulator                    Patient was recommended to continue MH to decrease pain.                      PT Education - 12/22/15 1118    Education provided Yes   Education Details plan of care   Person(s) Educated Patient   Methods Explanation   Comprehension Verbalized understanding             PT Long Term Goals - 12/22/15 1124      PT LONG TERM GOAL #1   Title Patient will be independent in home exercise program to improve strength/mobility for better functional independence with ADLs.   Time 8   Period Weeks   Status New     PT LONG TERM GOAL #2   Title Patient (> seconds indicating an increased LE strength and improved balance.82 years old) will complete five times sit to stand test in < 15    Time 8   Period Weeks   Status New     PT LONG TERM GOAL #3   Title Patient will be able to perform household work/ chores without increase in symptoms.   Time 8   Period Weeks   Status New     PT LONG TERM GOAL #4   Title Patient will report a worst pain of 3/10 on VAS in             to improve tolerance with ADLs and reduced symptoms with activities.    Time 8   Period Weeks   Status New               Plan - 12/22/15 1121    Clinical Impression Statement Patient has low back pain and RLE pain down to his right ankle. He has difficulty waking, standing, sitting and mobility with transfers due to back pain 10/10. He has tenderness to palpation in paraspinal muscles, and right glut muscle   Rehab Potential Fair   Clinical Impairments Affecting Rehab Potential  age, back surgery x 3, steps   PT Frequency 2x / week   PT Duration 8 weeks   PT Treatment/Interventions Moist Heat;Electrical Stimulation;Gait training;Therapeutic activities;Therapeutic exercise;Manual techniques;Dry needling   PT Next Visit Plan manual therapy,  e-stim, heat   Consulted and Agree with Plan of Care Patient;Family member/caregiver      Patient will benefit from skilled therapeutic intervention in order to improve the following deficits and impairments:  Abnormal gait, Pain, Impaired flexibility, Decreased strength, Decreased mobility, Difficulty walking, Impaired sensation, Decreased range of motion  Visit Diagnosis: Acute right-sided low back pain with sciatica, sciatica laterality unspecified  Difficulty in walking, not elsewhere classified  Muscle weakness (generalized)      G-Codes - January 01, 2016 1127    Functional Assessment Tool Used 5 x sit to stand, 10 MW   Functional Limitation Mobility: Walking and moving around   Mobility: Walking and Moving Around Current Status [phone]) At least 80 percent but less than 100 percent impaired, limited or restricted   Mobility: Walking and Moving Around Goal Status [phone]) At least 60 percent but less than 80 percent impaired, limited or restricted       Problem List There are no active problems to display for this patient.  Ezekiel Ina, PT, DPT [place], [place] Bruns S 2016/01/01, 12:03 PM  [place] [facility] MAIN [facility] SERVICES [address] [place], [place], [address] Phone: [phone]   Fax:  [phone]  Name: Zachary Peterson MRN: [id number] Date of Birth: 1931/11/19

## 2015-12-26 ENCOUNTER — Ambulatory Visit: Payer: Medicare HMO

## 2015-12-26 DIAGNOSIS — M5440 Lumbago with sciatica, unspecified side: Secondary | ICD-10-CM | POA: Diagnosis not present

## 2015-12-26 DIAGNOSIS — M6281 Muscle weakness (generalized): Secondary | ICD-10-CM

## 2015-12-26 DIAGNOSIS — R262 Difficulty in walking, not elsewhere classified: Secondary | ICD-10-CM

## 2015-12-26 NOTE — Therapy (Signed)
[place] [facility] [address] [place], [place], [address] Phone: [phone]   Fax:  [phone]  Physical Therapy Treatment  Patient Details  Name: Zachary Peterson MRN: [id number] Date of Birth: 05/17/1931 Referring Provider: Linus SalmonsMILLER, JOSEPH K  Encounter Date: 12/26/2015      PT End of Session - 12/26/15 0959    Visit Number 2   Number of Visits 17   Date for PT Re-Evaluation 02/16/16   Authorization Type g codes   Authorization Time Period 2/10   PT Start Time 0900   PT Stop Time 0948   PT Time Calculation (min) 48 min   Activity Tolerance Patient limited by pain   Behavior During Therapy [facility] for tasks assessed/performed      Past Medical History:  Diagnosis Date  . COPD (chronic obstructive pulmonary disease) (HCC)   . Hyperlipemia   . Hypertension   . Seizure [facility](HCC)     Past Surgical History:  Procedure Laterality Date  . HERNIA REPAIR      There were no vitals filed for this visit.      Subjective Assessment - 12/26/15 0954    Subjective Patient reports he had decreased low back pain yesterday but increased reports increased today. Patient states the pain is intense in his ankle currently.    Patient is accompained by: Family member   Pertinent History back surgery 1980's, , high blood pressure, seizures, RA,chronic low backpain   Limitations Sitting;Lifting;Standing;Walking   How long can you sit comfortably? all day   How long can you stand comfortably? a few minutes   How long can you walk comfortably? 5 minutes   Diagnostic tests x ray   Patient Stated Goals to walk better   Currently in Pain? Yes   Pain Score 10-Worst pain ever   Pain Location Back   Pain Orientation Right   Pain Descriptors / Indicators Constant   Pain Type Chronic pain   Pain Onset 1 to 4 weeks ago   Pain Frequency Constant      TREATMENT: Manual Therapy: STM to the R glute max, piriformis, and glute med area to decrease  muscle spasms and TrP in the area. Grade I-II mobs to L3-S1 to decrease lumbar pain and spasms (Increased pain along L5 segment versus the remaining segments) - 2 x 30 secs each segment  Therapeutic Exercise: Prone lying on pillow under pelvis first 25 min of therapy session - progressed to no pillow under hips to further improve lumbar extension (patient demonstrates no increase in pain afterwards) Prone on elbows - 2 x 8 min while performing manual therapy Hip extension with knee bent and manual assistance from PT - 2 x  10 on R LE only Hip IR/ER in prone with manual assistance from PT - 2 min x 2  Education: Educated heavily on importance of avoiding flexion based postures, performing walking throughout the day (at least 5 min at a time), and spending time on stomach to improve lumbar extension and subsequent decrease in pain.         PT Education - 12/26/15 0958    Education provided Yes   Education Details Educated on neurological physiology/anatomy   Person(s) Educated Patient   Methods Explanation   Comprehension Verbalized understanding             PT Long Term Goals - 12/22/15 1124      PT LONG TERM GOAL #1   Title Patient will be independent  in home exercise program to improve strength/mobility for better functional independence with ADLs.   Time 8   Period Weeks   Status New     PT LONG TERM GOAL #2   Title Patient (> seconds indicating an increased LE strength and improved balance.82 years old) will complete five times sit to stand test in < 15    Time 8   Period Weeks   Status New     PT LONG TERM GOAL #3   Title Patient will be able to perform household work/ chores without increase in symptoms.   Time 8   Period Weeks   Status New     PT LONG TERM GOAL #4   Title Patient will report a worst pain of 3/10 on VAS in             to improve tolerance with ADLs and reduced symptoms with activities.    Time 8   Period Weeks   Status New                Plan - 12/26/15 0959    Clinical Impression Statement Patient demonstated decrease in ankle/glute/back pain after performing exercises in prone indicating improved lumbar AROM and motor control. Performed mobility based exercises to improve lumbar extension to decrease pain. Patient continues to have pain at end of sesion but is significantly decreased. Patient demonstrates improved ease with ambulation at end of session. Patient will benefit from further skilled therapy focused on improving back pain and strengthening to decrease fall risk.    Rehab Potential Fair   Clinical Impairments Affecting Rehab Potential age, back surgery x 3, steps   PT Frequency 2x / week   PT Duration 8 weeks   PT Treatment/Interventions Moist Heat;Electrical Stimulation;Gait training;Therapeutic activities;Therapeutic exercise;Manual techniques;Dry needling   PT Next Visit Plan manual therapy, e-stim, heat   Consulted and Agree with Plan of Care Patient;Family member/caregiver      Patient will benefit from skilled therapeutic intervention in order to improve the following deficits and impairments:  Abnormal gait, Pain, Impaired flexibility, Decreased strength, Decreased mobility, Difficulty walking, Impaired sensation, Decreased range of motion  Visit Diagnosis: Acute right-sided low back pain with sciatica, sciatica laterality unspecified  Difficulty in walking, not elsewhere classified  Muscle weakness (generalized)     Problem List There are no active problems to display for this patient.   Myrene GalasWesley Pete Schnitzer, PT DPT 12/26/2015, 10:12 AM  [place] [facility] MA[facility] [address] [place], [place], [address] Phone: [phone]   Fax:  [phone]  Name: Zachary Peterson MRN: [id number] Date of Birth: 07/11/1931

## 2015-12-28 ENCOUNTER — Encounter: Payer: Self-pay | Admitting: Physical Therapy

## 2015-12-28 ENCOUNTER — Ambulatory Visit: Payer: Medicare HMO | Admitting: Physical Therapy

## 2015-12-28 VITALS — BP 148/63 | HR 93

## 2015-12-28 DIAGNOSIS — M6281 Muscle weakness (generalized): Secondary | ICD-10-CM

## 2015-12-28 DIAGNOSIS — R262 Difficulty in walking, not elsewhere classified: Secondary | ICD-10-CM

## 2015-12-28 DIAGNOSIS — M5440 Lumbago with sciatica, unspecified side: Secondary | ICD-10-CM

## 2015-12-28 NOTE — Therapy (Signed)
[facility] [address] , [place], [address] Phone: [phone]   Fax:  [phone]  Physical Therapy Treatment  Patient Details  Name: Zachary Peterson MRN: [id number] Date of Birth: 1931-03-15 Referring Provider: Linus SalmonsMILLER, JOSEPH K  Encounter Date: 12/28/2015      PT End of Session - 12/28/15 1430    Visit Number 3   Number of Visits 17   Date for PT Re-Evaluation 02/16/16   Authorization Type g codes   Authorization Time Period 3/10   PT Start Time 1431   PT Stop Time 1515   PT Time Calculation (min) 44 min   Activity Tolerance Patient limited by pain   Behavior During Therapy [facility] for tasks assessed/performed      Past Medical History:  Diagnosis Date  . COPD (chronic obstructive pulmonary disease) (HCC)   . Hyperlipemia   . Hypertension   . Seizure [facility](HCC)     Past Surgical History:  Procedure Laterality Date  . HERNIA REPAIR      Vitals:   12/28/15 1435  BP: (!) 148/63  Pulse: 93  SpO2: 97%        Subjective Assessment - 12/28/15 1435    Subjective Pt reports he had some relief the day after his last session but is having pain down his RLE down to his R ankle today.  He has been trying to be more conscious of avoiding flexion activities.     Patient is accompained by: Family member   Pertinent History back surgery 1980's, , high blood pressure, seizures, RA,chronic low backpain   Limitations Sitting;Lifting;Standing;Walking   How long can you sit comfortably? all day   How long can you stand comfortably? a few minutes   How long can you walk comfortably? 5 minutes   Diagnostic tests x ray   Patient Stated Goals to walk better   Currently in Pain? Yes   Pain Score 10-Worst pain ever   Pain Location Leg   Pain Orientation Right   Pain Descriptors / Indicators Aching;Constant   Pain Type Chronic pain   Pain Onset 1 to 4 weeks ago   Multiple Pain Sites No        TREATMENT    Therapeutic Activity prior to Therapeutic Exercise and Manual Therapy:  Taught pt log roll for in and OOB which pt demonstrated with min guard assist and cues for technique. (5 mins)   Manual Therapy:  Pt prone. STM to the R glute max, piriformis, glute med, Bil lumbar paraspinals area to decrease muscle spasms and TrP in the area. Grade I-II mobs to L3-S1 30 sec each segment to decrease lumbar pain and spasms (Increased pain along L5 segment versus the remaining segments)   Therapeutic Exercise:  Pt in prone position from start of session today which pt was able to tolerate.  Prone on elbows x4 mins (pain decreased to 5/10 and pain only down to R knee with this exercise compared to "at least a 10" and down to the R ankle at the start of the session.  +Bil Slump Test but more symptomatic on RLE. Sciatic nerve glide in sitting, pt with upright posture and gentle DF/PF x10  Seated clams with RTB around knees 3x10  Manually resisted seated R hip IR/ER x10 each side  Sit<>stand x10 without use of UEs, pt demonstrated good eccentric control.               PT Education - 12/28/15 1430  Education provided Yes   Education Details Exercise technique, pertinent pathophysiology/anatomy   Person(s) Educated Patient   Methods Demonstration;Tactile cues;Verbal cues;Explanation   Comprehension Verbalized understanding;Returned demonstration;Verbal cues required;Tactile cues required;Need further instruction             PT Long Term Goals - 12/22/15 1124      PT LONG TERM GOAL #1   Title Patient will be independent in home exercise program to improve strength/mobility for better functional independence with ADLs.   Time 8   Period Weeks   Status New     PT LONG TERM GOAL #2   Title Patient (> seconds indicating an increased LE strength and improved balance.82 years old) will complete five times sit to stand test in < 15    Time 8   Period Weeks   Status New     PT LONG TERM  GOAL #3   Title Patient will be able to perform household work/ chores without increase in symptoms.   Time 8   Period Weeks   Status New     PT LONG TERM GOAL #4   Title Patient will report a worst pain of 3/10 on VAS in             to improve tolerance with ADLs and reduced symptoms with activities.    Time 8   Period Weeks   Status New               Plan - 12/28/15 1506    Clinical Impression Statement Pt presents with "more than 10/10" pain down RLE.  Following prone extension exercise and STM pt's pain reduces to 5/10 and only radiates down to knee as compared to down to ankle at start of session.  Following therapeutic exercise, pain down to 3/10.  Pt was educated in log roll technique to protect his back with in/OOB at home.  Pt will benefit from continued skilled PT interventions for improved mobility, strength, balance, and reduced pain.   Rehab Potential Fair   Clinical Impairments Affecting Rehab Potential age, back surgery x 3, steps   PT Frequency 2x / week   PT Duration 8 weeks   PT Treatment/Interventions Moist Heat;Electrical Stimulation;Gait training;Therapeutic activities;Therapeutic exercise;Manual techniques;Dry needling   PT Next Visit Plan manual therapy, e-stim, heat   Consulted and Agree with Plan of Care Patient;Family member/caregiver      Patient will benefit from skilled therapeutic intervention in order to improve the following deficits and impairments:  Abnormal gait, Pain, Impaired flexibility, Decreased strength, Decreased mobility, Difficulty walking, Impaired sensation, Decreased range of motion  Visit Diagnosis: Acute right-sided low back pain with sciatica, sciatica laterality unspecified  Difficulty in walking, not elsewhere classified  Muscle weakness (generalized)     Problem List There are no active problems to display for this patient.   Encarnacion ChuAshley Abashian PT, DPT 12/28/2015, 3:19 PM  [place] [facility] [address] [place], [place], [address] Phone: [phone]   Fax:  [phone]  Name: Zachary Peterson MRN: [id number] Date of Birth: 24-Dec-1931

## 2016-01-04 ENCOUNTER — Encounter: Payer: Self-pay | Admitting: Physical Therapy

## 2016-01-04 ENCOUNTER — Ambulatory Visit: Payer: Medicare HMO

## 2016-01-04 VITALS — BP 154/76 | HR 85

## 2016-01-04 DIAGNOSIS — R262 Difficulty in walking, not elsewhere classified: Secondary | ICD-10-CM

## 2016-01-04 DIAGNOSIS — M5440 Lumbago with sciatica, unspecified side: Secondary | ICD-10-CM

## 2016-01-04 DIAGNOSIS — M6281 Muscle weakness (generalized): Secondary | ICD-10-CM

## 2016-01-04 NOTE — Therapy (Signed)
[place] [facility] [address] [place], [place], [address] Phone: [phone]   Fax:  [phone]  Physical Therapy Treatment  Patient Details  Name: Zachary RavelSamuel D Gopal MRN: [id number] Date of Birth: [birth year] Referring Provider: Linus SalmonsMILLER, JOSEPH K  Encounter Date: 01/04/2016      PT End of Session - 01/04/16 1429    Visit Number 4   Number of Visits 17   Date for PT Re-Evaluation 02/16/16   Authorization Type g codes   Authorization Time Period 4/10   PT Start Time 1400   PT Stop Time 1430   PT Time Calculation (min) 30 min   Activity Tolerance Patient limited by pain   Behavior During Therapy [facility] for tasks assessed/performed      Past Medical History:  Diagnosis Date  . COPD (chronic obstructive pulmonary disease) (HCC)   . Hyperlipemia   . Hypertension   . Seizure [facility](HCC)     Past Surgical History:  Procedure Laterality Date  . HERNIA REPAIR      Vitals:   01/04/16 1405  BP: (!) 154/76  Pulse: 85  SpO2: 100%        Subjective Assessment - 01/04/16 1358    Subjective Pt reports that he is not having any improvement in his back pain. He states that he had approximately 1 hour of relief after last therapy session but otherwise his back pain is the same. He states that he finds it difficult to ambulate at home due to the pain. Reports that laying on his stomach makes his back pain worse with increasind pain down the RLE.   Patient is accompained by: Family member   Pertinent History back surgery 1980's, , high blood pressure, seizures, RA,chronic low backpain   Limitations Sitting;Lifting;Standing;Walking   How long can you sit comfortably? all day   How long can you stand comfortably? a few minutes   How long can you walk comfortably? 5 minutes   Diagnostic tests x ray   Patient Stated Goals to walk better   Currently in Pain? Yes   Pain Score 10-Worst pain ever   Pain Location Leg   Pain Orientation Right    Pain Descriptors / Indicators Aching   Pain Type Chronic pain   Pain Radiating Towards RLE to the ankle   Pain Onset 1 to 4 weeks ago   Pain Frequency Constant   Aggravating Factors  walking, laying on stomach, movement   Effect of Pain on Daily Activities Unable to walk   Multiple Pain Sites No            TREATMENT   Manual Therapy:  Long axis R hip distraction with belt 20s hold, 20 second rest x 5; R hip inferior mobilizations at 90 hip flexion, belt assist, grade III, 30s/bout x 4 bouts; R sciatic nerve glides in SLR position with cervical flexion/extension and ankle DF/PF x 10; L sidelying STM to the R glute max, piriformis, glute med,  MHP applied to R lateral and posterior hip x 5 minutes in L sidelying position (no charge);                      PT Education - 01/04/16 1429    Education provided Yes   Education Details Plan of care   Person(s) Educated Patient   Methods Explanation   Comprehension Verbalized understanding             PT Long Term Goals -  12/22/15 1124      PT LONG TERM GOAL #1   Title Patient will be independent in home exercise program to improve strength/mobility for better functional independence with ADLs.   Time 8   Period Weeks   Status New     PT LONG TERM GOAL #2   Title Patient (> seconds indicating an increased LE strength and improved balance.82 years old) will complete five times sit to stand test in < 15    Time 8   Period Weeks   Status New     PT LONG TERM GOAL #3   Title Patient will be able to perform household work/ chores without increase in symptoms.   Time 8   Period Weeks   Status New     PT LONG TERM GOAL #4   Title Patient will report a worst pain of 3/10 on VAS in             to improve tolerance with ADLs and reduced symptoms with activities.    Time 8   Period Weeks   Status New               Plan - 01/04/16 1429    Clinical Impression Statement Pt presents with very  high levels of pain. Reports that prone positioning peripheralizes his symptoms. However during prior sessions pt demonstrates centralization of symptoms with prone activities. Todays session focused on pain-relieving measures. Pt reports reduction of pain from 10/10 initially to 5/10 at end of session. Pt encouraged to continue HEP. If he cannot find more sustained pain-relieving benefit from therapy after a few more sessions he may be appropriate for discharge.    Rehab Potential Fair   Clinical Impairments Affecting Rehab Potential age, back surgery x 3, steps   PT Frequency 2x / week   PT Duration 8 weeks   PT Treatment/Interventions Moist Heat;Electrical Stimulation;Gait training;Therapeutic activities;Therapeutic exercise;Manual techniques;Dry needling   PT Next Visit Plan manual therapy, e-stim, heat   PT Home Exercise Plan Continue as prescribed   Consulted and Agree with Plan of Care Patient;Family member/caregiver      Patient will benefit from skilled therapeutic intervention in order to improve the following deficits and impairments:  Abnormal gait, Pain, Impaired flexibility, Decreased strength, Decreased mobility, Difficulty walking, Impaired sensation, Decreased range of motion  Visit Diagnosis: Acute right-sided low back pain with sciatica, sciatica laterality unspecified  Difficulty in walking, not elsewhere classified  Muscle weakness (generalized)     Problem List There are no active problems to display for this patient.  Lynnea MaizesJason D Adelina Collard PT, DPT   Camielle Sizer 01/04/2016, 4:12 PM  [place] [facility] [address] [place], [place], [address] Phone: [phone]   Fax:  [phone]  Name: Zachary RavelSamuel D Ly MRN: [id number] Date of Birth: 1931/06/14

## 2016-01-10 ENCOUNTER — Ambulatory Visit: Payer: Medicare HMO | Attending: Unknown Physician Specialty | Admitting: Physical Therapy

## 2016-01-10 ENCOUNTER — Encounter: Payer: Self-pay | Admitting: Physical Therapy

## 2016-01-10 DIAGNOSIS — M6281 Muscle weakness (generalized): Secondary | ICD-10-CM | POA: Diagnosis present

## 2016-01-10 DIAGNOSIS — R262 Difficulty in walking, not elsewhere classified: Secondary | ICD-10-CM | POA: Insufficient documentation

## 2016-01-10 DIAGNOSIS — M5440 Lumbago with sciatica, unspecified side: Secondary | ICD-10-CM

## 2016-01-10 NOTE — Therapy (Addendum)
[place] MAIN [facility] SERVICES [address] [place], [place], [address] Phone: [phone]   Fax:  [phone]  Physical Therapy Treatment/DC Summary  Patient Details  Name: Zachary Peterson MRN: [id number] Date of Birth: 08/07/1931 Referring Provider: Jacques Navy  Encounter Date: 01/10/2016      PT End of Session - 01/10/16 1131    Visit Number 5   Number of Visits 17   Date for PT Re-Evaluation February 28, 2016   Authorization Type g codes   Authorization Time Period 4/10   PT Start Time 1130   PT Stop Time 1215   PT Time Calculation (min) 45 min   Activity Tolerance Patient limited by pain   Behavior During Therapy [facility] for tasks assessed/performed      Past Medical History:  Diagnosis Date  . COPD (chronic obstructive pulmonary disease) ([place])   . Hyperlipemia   . Hypertension   . Seizure [facility])     Past Surgical History:  Procedure Laterality Date  . HERNIA REPAIR      There were no vitals filed for this visit.      Subjective Assessment - 01/10/16 1132    Subjective Pt reports that he felt better for 3-4 minutes after last therapy session but otherwise his back pain is the same. Patient went to a chiropracter and she said she could not help him. Patient says that he is having 6/10 pain in his right leg and right ankle.    Patient is accompained by: Family member   Pertinent History back surgery 1980's, , high blood pressure, seizures, RA,chronic low backpain   Limitations Sitting;Lifting;Standing;Walking   How long can you sit comfortably? all day   How long can you stand comfortably? a few minutes   How long can you walk comfortably? 5 minutes   Diagnostic tests x ray   Patient Stated Goals to walk better   Currently in Pain? Yes   Pain Score 6    Pain Location Ankle   Pain Orientation Right   Pain Descriptors / Indicators Aching   Pain Type Chronic pain   Pain Onset 1 to 4 weeks ago   Pain Frequency Constant   Aggravating Factors  walking,    Effect of Pain on Daily Activities unable to walk      Therapeutic exercise: Prone press up x 6 reps hooklying marching x 15 x 2 hooklying ER/abd with RTB x 15 hooklying TA x 10 sec x 10 Nu-step x 5 mins with MH MH during exercises in prone and supine . Patient reports pain 6/10 prior to therapy and  6 /10 following therapy. Patient is not able to decrease his pain with functional mobility or with transfers or ambulation. Discussed DC  from PT due to high pain levels and no changes following therapy during this session or other sessions. Patient was recommended to go back to the MD due to his high pain levels not being able to change from skilled PT interventions.                            PT Education - 01/10/16 1148    Education provided Yes   Education Details use of heat and ice   Person(s) Educated Patient   Methods Explanation   Comprehension Verbalized understanding             PT Long Term Goals - 01/10/16 1211      PT  LONG TERM GOAL #1   Title Patient will be independent in home exercise program to improve strength/mobility for better functional independence with ADLs.   Time 8   Period Weeks   Status Not Met     PT LONG TERM GOAL #2   Title Patient (> seconds indicating an increased LE strength and improved balance.83 years old) will complete five times sit to stand test in < 15    Time 8   Period Weeks   Status Not Met     PT LONG TERM GOAL #3   Title Patient will be able to perform household work/ chores without increase in symptoms.   Time 8   Period Weeks   Status Not Met     PT LONG TERM GOAL #4   Title Patient will report a worst pain of 3/10 on VAS in  low back and leg to improve tolerance with ADLs and reduced symptoms with activities.    Time 8   Period Weeks   Status Partially Met               Plan - 01-18-16 1150    Clinical Impression Statement Patient presents with 6/10  right leg and right ankle pain today. He is able to perform 6 prone press ups, hooklying marching, hooklying hip abd/ER with no reports of relief during therapy.    Rehab Potential Fair   Clinical Impairments Affecting Rehab Potential age, back surgery x 3, steps   PT Frequency 2x / week   PT Duration 8 weeks   PT Treatment/Interventions Moist Heat;Electrical Stimulation;Gait training;Therapeutic activities;Therapeutic exercise;Manual techniques;Dry needling   PT Next Visit Plan manual therapy, e-stim, heat   PT Home Exercise Plan Continue as prescribed   Consulted and Agree with Plan of Care Patient;Family member/caregiver      Patient will benefit from skilled therapeutic intervention in order to improve the following deficits and impairments:  Abnormal gait, Pain, Impaired flexibility, Decreased strength, Decreased mobility, Difficulty walking, Impaired sensation, Decreased range of motion  Visit Diagnosis: Acute right-sided low back pain with sciatica, sciatica laterality unspecified  Difficulty in walking, not elsewhere classified  Muscle weakness (generalized)       G-Codes - Jan 18, 2016 1211    Functional Assessment Tool Used clinical judgement   Functional Limitation Mobility: Walking and moving around   Mobility: Walking and Moving Around Current Status [phone]) At least 80 percent but less than 100 percent impaired, limited or restricted   Mobility: Walking and Moving Around Goal Status [phone]) At least 80 percent but less than 100 percent impaired, limited or restricted   Mobility: Walking and Moving Around Discharge Status [phone]) At least 80 percent but less than 100 percent impaired, limited or restricted      Problem List There are no active problems to display for this patient.  Alanson Puls, PT, DPT [place], Minette Headland S 01/18/2016, 5:40 PM  [place] MAIN [facility] SERVICES [address] [place], [place], [address] Phone:  [phone]   Fax:  [phone]  Name: JATIN NAUMANN MRN: [id number] Date of Birth: 1931/10/04

## 2016-01-12 ENCOUNTER — Encounter: Payer: Commercial Managed Care - HMO | Admitting: Physical Therapy

## 2016-01-17 ENCOUNTER — Encounter: Payer: Commercial Managed Care - HMO | Admitting: Physical Therapy

## 2016-01-18 ENCOUNTER — Other Ambulatory Visit: Payer: Self-pay | Admitting: Unknown Physician Specialty

## 2016-01-18 DIAGNOSIS — M5416 Radiculopathy, lumbar region: Secondary | ICD-10-CM

## 2016-01-19 ENCOUNTER — Ambulatory Visit: Payer: Medicare HMO

## 2016-01-24 ENCOUNTER — Encounter: Payer: Commercial Managed Care - HMO | Admitting: Physical Therapy

## 2016-01-26 ENCOUNTER — Encounter: Payer: Commercial Managed Care - HMO | Admitting: Physical Therapy

## 2016-01-26 ENCOUNTER — Ambulatory Visit: Payer: Medicare HMO

## 2016-01-30 ENCOUNTER — Encounter: Payer: Commercial Managed Care - HMO | Admitting: Physical Therapy

## 2016-02-01 ENCOUNTER — Encounter: Payer: Commercial Managed Care - HMO | Admitting: Physical Therapy

## 2016-02-04 ENCOUNTER — Ambulatory Visit
Admission: RE | Admit: 2016-02-04 | Discharge: 2016-02-04 | Disposition: A | Discharge status: Home / Self Care | Payer: Medicare HMO | Source: Ambulatory Visit | Attending: Unknown Physician Specialty | Admitting: Unknown Physician Specialty

## 2016-02-04 DIAGNOSIS — M5416 Radiculopathy, lumbar region: Secondary | ICD-10-CM | POA: Insufficient documentation

## 2016-02-04 DIAGNOSIS — M48061 Spinal stenosis, lumbar region without neurogenic claudication: Secondary | ICD-10-CM | POA: Diagnosis not present

## 2016-02-04 DIAGNOSIS — G031 Chronic meningitis: Secondary | ICD-10-CM | POA: Diagnosis not present

## 2016-02-04 DIAGNOSIS — M2578 Osteophyte, vertebrae: Secondary | ICD-10-CM | POA: Insufficient documentation

## 2016-02-04 DIAGNOSIS — M5126 Other intervertebral disc displacement, lumbar region: Secondary | ICD-10-CM | POA: Diagnosis not present

## 2016-02-04 DIAGNOSIS — M4807 Spinal stenosis, lumbosacral region: Secondary | ICD-10-CM | POA: Diagnosis not present

## 2016-02-04 DIAGNOSIS — M5136 Other intervertebral disc degeneration, lumbar region: Secondary | ICD-10-CM | POA: Diagnosis not present

## 2016-02-04 DIAGNOSIS — M5127 Other intervertebral disc displacement, lumbosacral region: Secondary | ICD-10-CM | POA: Diagnosis not present

## 2016-02-04 DIAGNOSIS — R600 Localized edema: Secondary | ICD-10-CM | POA: Diagnosis not present

## 2016-02-04 DIAGNOSIS — M8938 Hypertrophy of bone, other site: Secondary | ICD-10-CM | POA: Diagnosis not present

## 2016-02-04 LAB — POCT I-STAT CREATININE: Creatinine, Ser: 1.1 mg/dL (ref 0.61–1.24)

## 2016-02-04 MED ORDER — GADOBENATE DIMEGLUMINE 529 MG/ML IV SOLN
13.0000 mL | Freq: Once | INTRAVENOUS | Status: AC | PRN
  Administered 2016-02-04: 13 mL via INTRAVENOUS

## 2016-02-06 ENCOUNTER — Encounter: Payer: Commercial Managed Care - HMO | Admitting: Physical Therapy

## 2016-02-08 ENCOUNTER — Ambulatory Visit: Payer: Commercial Managed Care - HMO | Admitting: Physical Therapy

## 2016-02-20 ENCOUNTER — Inpatient Hospital Stay
Admission: EM | Admit: 2016-02-20 | Discharge: 2016-02-24 | DRG: 101 | Disposition: A | Discharge status: Home Health | Payer: Medicare HMO | Attending: Internal Medicine | Admitting: Internal Medicine

## 2016-02-20 DIAGNOSIS — E876 Hypokalemia: Secondary | ICD-10-CM | POA: Diagnosis present

## 2016-02-20 DIAGNOSIS — F329 Major depressive disorder, single episode, unspecified: Secondary | ICD-10-CM | POA: Diagnosis present

## 2016-02-20 DIAGNOSIS — N179 Acute kidney failure, unspecified: Secondary | ICD-10-CM | POA: Diagnosis present

## 2016-02-20 DIAGNOSIS — M6281 Muscle weakness (generalized): Secondary | ICD-10-CM

## 2016-02-20 DIAGNOSIS — E785 Hyperlipidemia, unspecified: Secondary | ICD-10-CM | POA: Diagnosis present

## 2016-02-20 DIAGNOSIS — I481 Persistent atrial fibrillation: Secondary | ICD-10-CM | POA: Diagnosis present

## 2016-02-20 DIAGNOSIS — G40409 Other generalized epilepsy and epileptic syndromes, not intractable, without status epilepticus: Principal | ICD-10-CM | POA: Diagnosis present

## 2016-02-20 DIAGNOSIS — I2489 Other forms of acute ischemic heart disease: Secondary | ICD-10-CM

## 2016-02-20 DIAGNOSIS — Z87891 Personal history of nicotine dependence: Secondary | ICD-10-CM | POA: Diagnosis not present

## 2016-02-20 DIAGNOSIS — K219 Gastro-esophageal reflux disease without esophagitis: Secondary | ICD-10-CM | POA: Diagnosis present

## 2016-02-20 DIAGNOSIS — R197 Diarrhea, unspecified: Secondary | ICD-10-CM

## 2016-02-20 DIAGNOSIS — I248 Other forms of acute ischemic heart disease: Secondary | ICD-10-CM | POA: Diagnosis present

## 2016-02-20 DIAGNOSIS — I10 Essential (primary) hypertension: Secondary | ICD-10-CM | POA: Diagnosis present

## 2016-02-20 DIAGNOSIS — A09 Infectious gastroenteritis and colitis, unspecified: Secondary | ICD-10-CM | POA: Diagnosis not present

## 2016-02-20 DIAGNOSIS — I4891 Unspecified atrial fibrillation: Secondary | ICD-10-CM | POA: Diagnosis present

## 2016-02-20 DIAGNOSIS — R262 Difficulty in walking, not elsewhere classified: Secondary | ICD-10-CM

## 2016-02-20 DIAGNOSIS — Z7951 Long term (current) use of inhaled steroids: Secondary | ICD-10-CM

## 2016-02-20 DIAGNOSIS — R569 Unspecified convulsions: Secondary | ICD-10-CM

## 2016-02-20 DIAGNOSIS — J449 Chronic obstructive pulmonary disease, unspecified: Secondary | ICD-10-CM | POA: Diagnosis present

## 2016-02-20 DIAGNOSIS — R7989 Other specified abnormal findings of blood chemistry: Secondary | ICD-10-CM

## 2016-02-20 DIAGNOSIS — R55 Syncope and collapse: Secondary | ICD-10-CM

## 2016-02-20 DIAGNOSIS — I959 Hypotension, unspecified: Secondary | ICD-10-CM | POA: Diagnosis present

## 2016-02-20 DIAGNOSIS — R778 Other specified abnormalities of plasma proteins: Secondary | ICD-10-CM

## 2016-02-20 DIAGNOSIS — A0811 Acute gastroenteropathy due to Norwalk agent: Secondary | ICD-10-CM | POA: Diagnosis present

## 2016-02-20 DIAGNOSIS — R079 Chest pain, unspecified: Secondary | ICD-10-CM | POA: Diagnosis not present

## 2016-02-20 DIAGNOSIS — A084 Viral intestinal infection, unspecified: Secondary | ICD-10-CM | POA: Diagnosis present

## 2016-02-20 DIAGNOSIS — M545 Low back pain: Secondary | ICD-10-CM | POA: Diagnosis present

## 2016-02-20 DIAGNOSIS — Z79899 Other long term (current) drug therapy: Secondary | ICD-10-CM | POA: Diagnosis not present

## 2016-02-20 HISTORY — DX: Depression, unspecified: F32.A

## 2016-02-20 HISTORY — DX: Migraine, unspecified, not intractable, without status migrainosus: G43.909

## 2016-02-20 HISTORY — DX: Palpitations: R00.2

## 2016-02-20 HISTORY — DX: Major depressive disorder, single episode, unspecified: F32.9

## 2016-02-20 HISTORY — DX: Unspecified hearing loss, unspecified ear: H91.90

## 2016-02-20 HISTORY — DX: Syncope and collapse: R55

## 2016-02-20 HISTORY — DX: Dorsalgia, unspecified: M54.9

## 2016-02-20 LAB — CBC WITH DIFFERENTIAL/PLATELET
BASOS ABS: 0 10*3/uL (ref 0–0.1)
Basophils Relative: 0 %
Eosinophils Absolute: 0 10*3/uL (ref 0–0.7)
Eosinophils Relative: 0 %
HEMATOCRIT: 43.7 % (ref 40.0–52.0)
Hemoglobin: 15.1 g/dL (ref 13.0–18.0)
Lymphocytes Relative: 2 %
Lymphs Abs: 0.2 10*3/uL — ABNORMAL LOW (ref 1.0–3.6)
MCH: 32.2 pg (ref 26.0–34.0)
MCHC: 34.4 g/dL (ref 32.0–36.0)
MCV: 93.5 fL (ref 80.0–100.0)
MONO ABS: 0.3 10*3/uL (ref 0.2–1.0)
MONOS PCT: 3 %
NEUTROS PCT: 95 %
Neutro Abs: 10.2 10*3/uL — ABNORMAL HIGH (ref 1.4–6.5)
Platelets: 243 10*3/uL (ref 150–440)
RBC: 4.68 MIL/uL (ref 4.40–5.90)
RDW: 14 % (ref 11.5–14.5)
WBC: 10.7 10*3/uL — AB (ref 3.8–10.6)

## 2016-02-20 LAB — BASIC METABOLIC PANEL
ANION GAP: 11 (ref 5–15)
BUN: 23 mg/dL — ABNORMAL HIGH (ref 6–20)
CHLORIDE: 99 mmol/L — AB (ref 101–111)
CO2: 27 mmol/L (ref 22–32)
Calcium: 8.5 mg/dL — ABNORMAL LOW (ref 8.9–10.3)
Creatinine, Ser: 1.47 mg/dL — ABNORMAL HIGH (ref 0.61–1.24)
GFR calc Af Amer: 49 mL/min — ABNORMAL LOW (ref >60)
GFR calc non Af Amer: 43 mL/min — ABNORMAL LOW (ref >60)
Glucose, Bld: 128 mg/dL — ABNORMAL HIGH (ref 65–99)
Potassium: 3.4 mmol/L — ABNORMAL LOW (ref 3.5–5.1)
Sodium: 137 mmol/L (ref 135–145)

## 2016-02-20 LAB — TROPONIN I: Troponin I: 0.7 ng/mL (ref <0.03)

## 2016-02-20 LAB — PROTIME-INR
INR: 1.18
PROTHROMBIN TIME: 15.1 s (ref 11.4–15.2)

## 2016-02-20 LAB — PHENYTOIN LEVEL, TOTAL: Phenytoin Lvl: 11.9 ug/mL (ref 10.0–20.0)

## 2016-02-20 LAB — APTT: aPTT: 155 seconds — ABNORMAL HIGH (ref 24–36)

## 2016-02-20 LAB — TSH: TSH: 1.34 u[IU]/mL (ref 0.350–4.500)

## 2016-02-20 LAB — MAGNESIUM: Magnesium: 2.1 mg/dL (ref 1.7–2.4)

## 2016-02-20 MED ORDER — MIRTAZAPINE 15 MG PO TABS
15.0000 mg | ORAL_TABLET | Freq: Every day | ORAL | Status: DC
  Administered 2016-02-20 – 2016-02-23 (×4): 15 mg via ORAL
  Filled 2016-02-20 (×4): qty 1

## 2016-02-20 MED ORDER — CALCIUM CARBONATE ANTACID 500 MG PO CHEW
400.0000 mg | CHEWABLE_TABLET | Freq: Once | ORAL | Status: AC
  Administered 2016-02-20: 400 mg via ORAL
  Filled 2016-02-20: qty 2

## 2016-02-20 MED ORDER — TIOTROPIUM BROMIDE MONOHYDRATE 18 MCG IN CAPS
18.0000 ug | ORAL_CAPSULE | Freq: Every day | RESPIRATORY_TRACT | Status: DC
  Administered 2016-02-20 – 2016-02-24 (×5): 18 ug via RESPIRATORY_TRACT
  Filled 2016-02-20 (×2): qty 5

## 2016-02-20 MED ORDER — AMIODARONE LOAD VIA INFUSION
150.0000 mg | Freq: Once | INTRAVENOUS | Status: AC
  Administered 2016-02-20: 150 mg via INTRAVENOUS
  Filled 2016-02-20: qty 83.34

## 2016-02-20 MED ORDER — PHENYTOIN SODIUM EXTENDED 100 MG PO CAPS
100.0000 mg | ORAL_CAPSULE | Freq: Three times a day (TID) | ORAL | Status: DC
  Administered 2016-02-20 – 2016-02-24 (×11): 100 mg via ORAL
  Filled 2016-02-20 (×11): qty 1

## 2016-02-20 MED ORDER — SODIUM CHLORIDE 0.9 % IV BOLUS (SEPSIS)
1000.0000 mL | INTRAVENOUS | Status: AC
  Administered 2016-02-20: 1000 mL via INTRAVENOUS

## 2016-02-20 MED ORDER — DIGOXIN 0.25 MG/ML IJ SOLN
0.2500 mg | Freq: Once | INTRAMUSCULAR | Status: AC
  Administered 2016-02-20: 0.25 mg via INTRAVENOUS
  Filled 2016-02-20: qty 2

## 2016-02-20 MED ORDER — SODIUM CHLORIDE 0.9% FLUSH
3.0000 mL | Freq: Two times a day (BID) | INTRAVENOUS | Status: DC
  Administered 2016-02-21 – 2016-02-24 (×7): 3 mL via INTRAVENOUS

## 2016-02-20 MED ORDER — AMIODARONE HCL IN DEXTROSE 360-4.14 MG/200ML-% IV SOLN
60.0000 mg/h | INTRAVENOUS | Status: DC
  Administered 2016-02-20 (×2): 60 mg/h via INTRAVENOUS
  Filled 2016-02-20: qty 200

## 2016-02-20 MED ORDER — HEPARIN SODIUM (PORCINE) 5000 UNIT/ML IJ SOLN
4000.0000 [IU] | Freq: Once | INTRAMUSCULAR | Status: AC
  Administered 2016-02-20: 4000 [IU] via INTRAVENOUS
  Filled 2016-02-20: qty 1

## 2016-02-20 MED ORDER — IBUPROFEN 400 MG PO TABS: 200.0000 mg | ORAL_TABLET | Freq: Four times a day (QID) | ORAL | Status: DC | PRN

## 2016-02-20 MED ORDER — PREGABALIN 25 MG PO CAPS
75.0000 mg | ORAL_CAPSULE | Freq: Two times a day (BID) | ORAL | Status: DC
  Administered 2016-02-20 – 2016-02-24 (×8): 75 mg via ORAL
  Filled 2016-02-20 (×8): qty 1

## 2016-02-20 MED ORDER — DIAZEPAM 5 MG PO TABS
5.0000 mg | ORAL_TABLET | Freq: Two times a day (BID) | ORAL | Status: DC
  Administered 2016-02-20 – 2016-02-24 (×8): 5 mg via ORAL
  Filled 2016-02-20 (×8): qty 1

## 2016-02-20 MED ORDER — SODIUM CHLORIDE 0.9 % IV SOLN
Freq: Once | INTRAVENOUS | Status: AC
  Administered 2016-02-20: 20:00:00 via INTRAVENOUS

## 2016-02-20 MED ORDER — HEPARIN (PORCINE) IN NACL 100-0.45 UNIT/ML-% IJ SOLN
950.0000 [IU]/h | INTRAMUSCULAR | Status: DC
  Administered 2016-02-20: 800 [IU]/h via INTRAVENOUS
  Administered 2016-02-21: 950 [IU]/h via INTRAVENOUS
  Filled 2016-02-20 (×2): qty 250

## 2016-02-20 MED ORDER — ASPIRIN 81 MG PO CHEW
324.0000 mg | CHEWABLE_TABLET | Freq: Once | ORAL | Status: AC
  Administered 2016-02-20: 324 mg via ORAL
  Filled 2016-02-20: qty 4

## 2016-02-20 MED ORDER — ACETAMINOPHEN 325 MG PO TABS: 650.0000 mg | ORAL_TABLET | Freq: Four times a day (QID) | ORAL | Status: DC | PRN

## 2016-02-20 MED ORDER — ONDANSETRON 8 MG PO TBDP
8.0000 mg | ORAL_TABLET | Freq: Three times a day (TID) | ORAL | Status: DC | PRN
  Filled 2016-02-20: qty 1

## 2016-02-20 MED ORDER — ONDANSETRON HCL 4 MG/2ML IJ SOLN
4.0000 mg | Freq: Four times a day (QID) | INTRAMUSCULAR | Status: DC | PRN
  Administered 2016-02-21: 4 mg via INTRAVENOUS
  Filled 2016-02-20: qty 2

## 2016-02-20 MED ORDER — AMIODARONE HCL IN DEXTROSE 360-4.14 MG/200ML-% IV SOLN
30.0000 mg/h | INTRAVENOUS | Status: DC
  Administered 2016-02-21: 30 mg/h via INTRAVENOUS
  Filled 2016-02-20 (×4): qty 200

## 2016-02-20 MED ORDER — SODIUM CHLORIDE 0.9 % IV BOLUS (SEPSIS)
500.0000 mL | INTRAVENOUS | Status: AC
  Administered 2016-02-20: 500 mL via INTRAVENOUS

## 2016-02-20 MED ORDER — POLYVINYL ALCOHOL 1.4 % OP SOLN
1.0000 [drp] | Freq: Three times a day (TID) | OPHTHALMIC | Status: DC | PRN
  Filled 2016-02-20: qty 15

## 2016-02-20 MED ORDER — ATORVASTATIN CALCIUM 20 MG PO TABS
40.0000 mg | ORAL_TABLET | Freq: Every day | ORAL | Status: DC
  Administered 2016-02-21 – 2016-02-23 (×3): 40 mg via ORAL
  Filled 2016-02-20 (×3): qty 2

## 2016-02-20 MED ORDER — POTASSIUM CHLORIDE IN NACL 40-0.9 MEQ/L-% IV SOLN
INTRAVENOUS | Status: DC
  Administered 2016-02-20 – 2016-02-21 (×3): 100 mL/h via INTRAVENOUS
  Administered 2016-02-22: 125 mL/h via INTRAVENOUS
  Filled 2016-02-20 (×6): qty 1000

## 2016-02-20 MED ORDER — HYDROCODONE-ACETAMINOPHEN 5-325 MG PO TABS: 1.0000 | ORAL_TABLET | Freq: Four times a day (QID) | ORAL | Status: DC | PRN

## 2016-02-20 MED ORDER — EZETIMIBE-SIMVASTATIN 10-80 MG PO TABS
1.0000 | ORAL_TABLET | Freq: Every evening | ORAL | Status: DC
  Filled 2016-02-20: qty 1

## 2016-02-20 MED ORDER — ACETAMINOPHEN 650 MG RE SUPP: 650.0000 mg | Freq: Four times a day (QID) | RECTAL | Status: DC | PRN

## 2016-02-20 MED ORDER — ONDANSETRON HCL 4 MG PO TABS: 4.0000 mg | ORAL_TABLET | Freq: Four times a day (QID) | ORAL | Status: DC | PRN

## 2016-02-20 MED ORDER — ASPIRIN EC 325 MG PO TBEC: 325.0000 mg | DELAYED_RELEASE_TABLET | Freq: Every day | ORAL | Status: DC

## 2016-02-20 MED ORDER — ONDANSETRON HCL 4 MG/2ML IJ SOLN
4.0000 mg | INTRAMUSCULAR | Status: AC
  Administered 2016-02-20: 4 mg via INTRAVENOUS
  Filled 2016-02-20: qty 2

## 2016-02-20 MED ORDER — PHENYTOIN SODIUM EXTENDED 100 MG PO CAPS
200.0000 mg | ORAL_CAPSULE | ORAL | Status: AC
  Administered 2016-02-20: 200 mg via ORAL
  Filled 2016-02-20: qty 2

## 2016-02-20 NOTE — ED Notes (Signed)
Iv meds gvien. Pt alert.  No chest pain or sob.  sinustach on monitor.  Family with pt   Iv fluids infusing.

## 2016-02-20 NOTE — ED Notes (Signed)
Resumed care from amber rn.  Pt alert.  Sinus tach on monitor.  Pt alert.  Denies pain.  Pt eating crackers and ginger ale.  siderails up x 2.

## 2016-02-20 NOTE — ED Triage Notes (Signed)
Pt arrived via ems for c/o seizure - pt has history of seizures and today while standing in the kitchen fell and had a "grand mal" seizure - hit head causing abrasion to front of forehead and right side of head - pt is A&O x4 but incontinent which is normal for this pt after having a seizure - pt running afib on monitor with history of the same 

## 2016-02-20 NOTE — ED Notes (Signed)
Report called to floor nurse alyss rn

## 2016-02-20 NOTE — ED Provider Notes (Addendum)
[facility] Emergency Department Provider Note  ____________________________________________   First MD Initiated Contact with Patient 02/20/16 1536     (approximate)  I have reviewed the triage vital signs and the nursing notes.   HISTORY  Chief Complaint Seizures    HPI Zachary Peterson is a 83 y.o. male with a history of seizure disorder on Dilantin who presents by EMS for a seizure in the setting of acute onset diarrhea and vomiting.  The patient reports that he has 2 family members (his wife and his grandson) who have been ill recently with vomiting and diarrhea.  His wife just got over it several days ago.  He felt in his normal state of health until this morning when he was having some severe acute onset cramping and he had to run to the bathroom and had a large volume loose stool.  He had 4 more of them over the course of the morning and then he had a seizure which was reportedly generalized and consistent with his usual generalized tonic-clonic seizures.  It was brief although he did hit the front of his forehead on an object in the bedroom.  He was incontinent of urine but that is normal after his seizures and he has had a seizure disorder for more than 30 years.  He did not take his dose of Dilantin this morning (which is typically Dilantin ER 200 mg by mouth).  He states he feels better now on his stomach is no longer hurting.  He is, however, tachycardic in the 120s and atrial fibrillation which is also chronic for him.  He does not take any anticoagulation.He is alert and oriented at this time and in no acute distress and just complaining of feeling dry and like he needs something to drink.  His symptoms were acute in onset, severe, and nothing in particular made it better nor worse.   Past Medical History:  Diagnosis Date  . Atrial fibrillation (HCC)   . COPD (chronic obstructive pulmonary disease) (HCC)   . Depression   . Hearing loss   .  Hyperlipemia   . Hypertension   . Migraine   . Seizure [facility])     Patient Active Problem List   Diagnosis Date Noted  . Non-ST elevation MI (NSTEMI) (HCC) 02/20/2016    Past Surgical History:  Procedure Laterality Date  . APPENDECTOMY    . BACK SURGERY    . CATARACT EXTRACTION    . hemmoridectomy    . HERNIA REPAIR      Prior to Admission medications   Medication Sig Start Date End Date Taking? Authorizing Provider  amLODipine (NORVASC) 10 MG tablet Take 10 mg by mouth daily.   Yes Historical Provider, MD  diazepam (VALIUM) 5 MG tablet Take 5 mg by mouth 2 (two) times daily. ANXIETY   Yes Historical Provider, MD  DILANTIN 100 MG ER capsule Take 100 mg by mouth 3 (three) times daily. 04/12/14  Yes Historical Provider, MD  ezetimibe-simvastatin (VYTORIN) 10-80 MG per tablet Take 1 tablet by mouth every evening.    Yes Historical Provider, MD  fluticasone (FLONASE) 50 MCG/ACT nasal spray Place 1 spray into both nostrils daily.   Yes Historical Provider, MD  hydrochlorothiazide (HYDRODIURIL) 25 MG tablet Take 25 mg by mouth daily.   Yes Historical Provider, MD  hydroxypropyl methylcellulose / hypromellose (ISOPTO TEARS / GONIOVISC) 2.5 % ophthalmic solution Place 1 drop into both eyes 3 (three) times daily as needed for dry eyes.  Yes Historical Provider, MD  ibuprofen (ADVIL,MOTRIN) 200 MG tablet Take 200 mg by mouth every 6 (six) hours as needed for headache or moderate pain.    Yes Historical Provider, MD  mirtazapine (REMERON) 15 MG tablet Take 15 mg by mouth at bedtime.   Yes Historical Provider, MD  ondansetron (ZOFRAN ODT) 8 MG disintegrating tablet Take 1 tablet (8 mg total) by mouth every 8 (eight) hours as needed for nausea or vomiting. 04/13/14  Yes Tatyana Kirichenko, PA-C  pregabalin (LYRICA) 75 MG capsule Take 75 mg by mouth 2 (two) times daily.   Yes Historical Provider, MD  tiotropium (SPIRIVA) 18 MCG inhalation capsule Place 18 mcg into inhaler and inhale daily.   Yes  Historical Provider, MD  HYDROcodone-acetaminophen (NORCO) 5-325 MG per tablet Take 1 tablet by mouth every 6 (six) hours as needed for moderate pain. 04/13/14   Tatyana Kirichenko, PA-C    Allergies Aspirin; Codeine; and Oxycodone  Family History  Problem Relation Age of Onset  . Heart disease Mother   . Hypertension Father     Social History Social History  Substance Use Topics  . Smoking status: Former Games developer  . Smokeless tobacco: Former Neurosurgeon  . Alcohol use No    Review of Systems Constitutional: No fever/chills Eyes: No visual changes. ENT: No sore throat. Cardiovascular: Denies chest pain. Respiratory: Denies shortness of breath. Gastrointestinal: Acute onset copious loose diarrhea 5 episodes, one episode of emesis after his seizure, and some abdominal cramping which has improved. Genitourinary: Negative for dysuria. Musculoskeletal: Negative for back pain. Skin: Negative for rash. Neurological: Negative for headaches, focal weakness or numbness.  Generalized tonic-clonic seizure similar to prior episodes.  10-point ROS otherwise negative.  ____________________________________________   PHYSICAL EXAM:  VITAL SIGNS: ED Triage Vitals  Enc Vitals Group     BP 02/20/16 1506 119/63     Pulse Rate 02/20/16 1506 (!) 115     Resp 02/20/16 1508 18     Temp 02/20/16 1511 97.6 F (36.4 C)     Temp Source 02/20/16 1511 Oral     SpO2 02/20/16 1506 96 %     Weight 02/20/16 1511 149 lb (67.6 kg)     Height 02/20/16 1511 5\' 10"  (1.778 m)     Head Circumference --      Peak Flow --      Pain Score 02/20/16 1512 0     Pain Loc --      Pain Edu? --      Excl. in GC? --     Constitutional: Alert and oriented. Well appearing and in no acute distress. Eyes: Conjunctivae are normal. PERRL. EOMI. Head: Superficial laceration and contusion to the front of his forehead but very minimal and not requiring intervention Nose: No congestion/rhinnorhea. Mouth/Throat: Mucous  membranes are moist. Neck: No stridor.  No meningeal signs.  No cervical spine tenderness to palpation. Cardiovascular: Normal rate, regular rhythm. Good peripheral circulation. Grossly normal heart sounds. Respiratory: Normal respiratory effort.  No retractions. Lungs CTAB. Gastrointestinal: Soft and nontender. No distention.  Musculoskeletal: No lower extremity tenderness nor edema. No gross deformities of extremities. Neurologic:  Normal speech and language. No gross focal neurologic deficits are appreciated.  Skin:  Skin is warm, dry and intact. No rash noted. Psychiatric: Mood and affect are normal. Speech and behavior are normal.  ____________________________________________   LABS (all labs ordered are listed, but only abnormal results are displayed)  Labs Reviewed  BASIC METABOLIC PANEL - Abnormal; Notable for the  following:       Result Value   Potassium 3.4 (*)    Chloride 99 (*)    Glucose, Bld 128 (*)    BUN 23 (*)    Creatinine, Ser 1.47 (*)    Calcium 8.5 (*)    GFR calc non Af Amer 43 (*)    GFR calc Af Amer 49 (*)    All other components within normal limits  CBC WITH DIFFERENTIAL/PLATELET - Abnormal; Notable for the following:    WBC 10.7 (*)    Neutro Abs 10.2 (*)    Lymphs Abs 0.2 (*)    All other components within normal limits  TROPONIN I - Abnormal; Notable for the following:    Troponin I 0.70 (*)    All other components within normal limits  PHENYTOIN LEVEL, TOTAL  MAGNESIUM  HEPARIN LEVEL (UNFRACTIONATED)  CBC  PROTIME-INR  APTT   ____________________________________________  EKG  ED ECG REPORT #1 I, Nicholas Ossa, the attending physician, personally viewed and interpreted this ECG.  Date: 02/20/2016 EKG Time: 18:03 Rate: 130 Rhythm: sinus tachycardia, RSR' QRS Axis: normal Intervals: prolonged QTc at 611 ms ST/T Wave abnormalities: Non-specific ST segment / T-wave changes, but no evidence of acute ischemia.  Abnormalities most notable  in inferior leads Conduction Disturbances: none Narrative Interpretation: Similar to prior EKG from 04/12/2014, but with some ST changes in inferior leads which may indicated acute ischemia.  Does not meet STEMI criteria.  ED ECG REPORT I, Tamlyn Sides, the attending physician, personally viewed and interpreted this ECG.  Date: 02/20/2016 EKG Time: 18:26 Rate: 127 Rhythm: normal sinus rhythm QRS Axis: normal Intervals: prolonged QTc at 527 ms ST/T Wave abnormalities: Non-specific ST segment / T-wave changes, but no evidence of acute ischemia. Conduction Disturbances: none Narrative Interpretation: does not meet STEMI criteria   ____________________________________________  RADIOLOGY   No results found.  ____________________________________________   PROCEDURES  Procedure(s) performed:   .Critical Care Performed by: Loleta Rose Authorized by: Loleta Rose   Critical care provider statement:    Critical care time (minutes):  45   Critical care time was exclusive of:  Separately billable procedures and treating other patients   Critical care was necessary to treat or prevent imminent or life-threatening deterioration of the following conditions:  Circulatory failure and cardiac failure   Critical care was time spent personally by me on the following activities:  Development of treatment plan with patient or surrogate, discussions with consultants, evaluation of patient's response to treatment, examination of patient, obtaining history from patient or surrogate, ordering and performing treatments and interventions, ordering and review of laboratory studies, ordering and review of radiographic studies, pulse oximetry, re-evaluation of patient's condition and review of old charts      Critical Care performed: Yes, see critical care procedure note(s) ____________________________________________   INITIAL IMPRESSION / ASSESSMENT AND PLAN / ED COURSE  Pertinent labs &  imaging results that were available during my care of the patient were reviewed by me and considered in my medical decision making (see chart for details).  Well-appearing in spite of his acute GI illness and seizure.  He is alert and oriented and in no acute distress.  I will allow him to eat and drink and given 1 L of fluids given his tachycardia and his GI volume losses.  I will check a phenytoin level, electrolytes, and monitor him.  He most likely has a viral gastroenteritis which lowered his seizure threshold.  I am giving him his usual  Dilantin dose as long as he can tolerate oral intake and we will reassess.   Clinical Course as of Feb 19 2018  Mon Feb 20, 2016  1751 The patient says that he feels better and wants to go home.  In the last almost 3 hours he has not had anymore vomiting or diarrhea.  He was able to tolerate by mouth intake.  Unfortunately his heart rate remains at about 130.  I am giving another 500mL bolus (no hx of CHF) and will reassess.  Discussed with family.  He is adamant that he feels fine and wants to go home, but I am concerned about his heart rate.  Ordering EKG.  [CF]  1849 EKGs were non-specific but somewhat concerning mostly in the inferior leads.  I added on a troponin and the initial trop was 0.7.  Still no chest pain/SOB.  BP down to about 95/65 now, but patient is still asymptomatic.  Will admit for further management after starting heparin and giving ASA.  Discussed with family.  [CF]  [id number] Discussed with Dr. Allena KatzPatel the hospitalist who will admit.  PAging Dr. Kirke CorinArida (on call for cath lab) to review EKG, just in case.  [CF]  1902 Dr. Kirke CorinArida reviewed the EKG and agrees that this is not a STEMI.  He agrees with my plan for admission, heparin, aspirin, and cycling the enzymes.  [CF]  2018 Reportedly the patient's granddaughter who is a nurse at another facility was very upset on the phone and did not feel like the patient was being adequately treated.  I went back to  the room and spent an extended period of time talking to the family who was present at bedside and explained again all of our thought processes, treatment modalities, the fact that Dr. Kirke CorinArida had already been consulted by phone, the concept of demand ischemia versus ACS as a cause of troponin elevation, etc.  The family members who are present or more comfortable at that point and the hospitalist is aware as well.  [CF]    Clinical Course User Index [CF] Loleta Roseory Branko Steeves, MD    ____________________________________________  FINAL CLINICAL IMPRESSION(S) / ED DIAGNOSES  Final diagnoses:  Diarrhea, unspecified type  Seizure (HCC)  Demand ischemia of myocardium (HCC)  Elevated troponin I level  NSTEMI   MEDICATIONS GIVEN DURING THIS VISIT:  Medications  heparin ADULT infusion 100 units/mL (25000 units/25150mL sodium chloride 0.45%) (800 Units/hr Intravenous New Bag/Given 02/20/16 1932)  atorvastatin (LIPITOR) tablet 40 mg (not administered)  aspirin EC tablet 325 mg (not administered)  amiodarone (NEXTERONE) 1.8 mg/mL load via infusion 150 mg (150 mg Intravenous Bolus from Bag 02/20/16 2004)    Followed by  amiodarone (NEXTERONE PREMIX) 360-4.14 MG/200ML-% (1.8 mg/mL) IV infusion (60 mg/hr Intravenous New Bag/Given 02/20/16 2004)    Followed by  amiodarone (NEXTERONE PREMIX) 360-4.14 MG/200ML-% (1.8 mg/mL) IV infusion (not administered)  sodium chloride 0.9 % bolus 1,000 mL (0 mLs Intravenous Stopped 02/20/16 1704)  phenytoin (DILANTIN) ER capsule 200 mg (200 mg Oral Given 02/20/16 1606)  sodium chloride 0.9 % bolus 500 mL (0 mLs Intravenous Stopped 02/20/16 2010)  ondansetron (ZOFRAN) injection 4 mg (4 mg Intravenous Given 02/20/16 1812)  heparin injection 4,000 Units (4,000 Units Intravenous Given 02/20/16 1857)  aspirin chewable tablet 324 mg (324 mg Oral Given 02/20/16 1858)  0.9 %  sodium chloride infusion ( Intravenous New Bag/Given 02/20/16 2007)  digoxin (LANOXIN) 0.25 MG/ML injection 0.25 mg  (0.25 mg Intravenous Given 02/20/16 2003)  NEW OUTPATIENT MEDICATIONS STARTED DURING THIS VISIT:  New Prescriptions   No medications on file    Modified Medications   No medications on file    Discontinued Medications   PREDNISONE (DELTASONE) 5 MG TABLET    Take 5 mg by mouth daily.     Note:  This document was prepared using Dragon voice recognition software and may include unintentional dictation errors.    Loleta Rose, MD 02/20/16 Mikle Bosworth    Loleta Rose, MD 02/20/16 2020

## 2016-02-20 NOTE — ED Notes (Signed)
primedoc in with pt and family 

## 2016-02-20 NOTE — Consult Note (Signed)
ANTICOAGULATION CONSULT NOTE - Initial Consult  Pharmacy Consult for heparin drip Indication: chest pain/ACS  Allergies  Allergen Reactions  . Aspirin Other (See Comments)    CAUSES ULCERS  . Codeine Nausea And Vomiting  . Oxycodone Nausea And Vomiting    SEIZURES    Patient Measurements: Height: 5\' 10"  (177.8 cm) Weight: 149 lb (67.6 kg) IBW/kg (Calculated) : 73 Heparin Dosing Weight: 67.6kg  Vital Signs: Temp: 97.6 F (36.4 C) (02/12 1511) Temp Source: Oral (02/12 1511) BP: 97/48 (02/12 1900) Pulse Rate: 132 (02/12 1900)  Labs:  Recent Labs  02/20/16 1518  HGB 15.1  HCT 43.7  PLT 243  CREATININE 1.47*  TROPONINI 0.70*    Estimated Creatinine Clearance: 37 mL/min (by C-G formula based on SCr of 1.47 mg/dL (H)).   Medical History: Past Medical History:  Diagnosis Date  . COPD (chronic obstructive pulmonary disease) (HCC)   . Hyperlipemia   . Hypertension   . Seizure (HCC)     Medications:  Scheduled:    Assessment: Pt is a 83 year old male with a PMH of seizures, Afib not on anticoagulation who presents after having a seizure after acute onset of vomiting and diarrhea. Pt first troponin resulted at 0.70. Pharmacy consulted to dose heparin drip.  Goal of Therapy:  Heparin level 0.3-0.7 units/ml Monitor platelets by anticoagulation protocol: Yes   Plan:  Give 4000 units bolus x 1 Start heparin infusion at 800 units/hr Check anti-Xa level in 8 hours and daily while on heparin Continue to monitor H&H and platelets  Melissa D Maccia, Pharm.D, BCPS Clinical Pharmacist  02/20/2016,7:06 PM

## 2016-02-20 NOTE — ED Notes (Signed)
Repeat ekg done.  No chest pain or sob.  Iv fluids infusing.  Pt alert.  Family with pt. Skin warm and dry

## 2016-02-20 NOTE — ED Notes (Signed)
Pt arrived via ems for c/o seizure - pt has history of seizures and today while standing in the kitchen fell and had a "grand mal" seizure - hit head causing abrasion to front of forehead and right side of head - pt is A&O x4 but incontinent which is normal for this pt after having a seizure - pt running afib on monitor with history of the same

## 2016-02-20 NOTE — ED Notes (Signed)
Lab called with troponin of 0.70  Dr York Ceriseforbach aware.

## 2016-02-20 NOTE — Progress Notes (Signed)
Patient's nurse in the ER states that she talk to her granddaughter who apparently is a Engineer, civil (consulting)nurse at [employer]. She demands the patient be placed in the stepdown unit. She also demands at the cardiologist's comment evaluate her grandfather right away. Patient currently does not meet criteria for stepdown unit. He does have elevated heart rate borderline blood pressure he will be started on amiodarone drip to slow down his heart rate. I have explained to the family was at bedside that if his condition worsens we would not hesitate to transfer him to the ICU. I also discussed the case with the cardiologist Dr. Kirke CorinArida who states that this is not a stemi and doesn't emergently need to evaluate the patient unless his condition changes. We will let him know. He will evaluate the patient first thing in the morning.

## 2016-02-20 NOTE — H&P (Signed)
[employer] [facility] - [place] [facility]   PATIENT NAME: Zachary Peterson    MR#:  [id number]  DATE OF BIRTH:  July 26, 1931  DATE OF ADMISSION:  02/20/2016  PRIMARY CARE PHYSICIAN: Ignacia Palma., MD   REQUESTING/REFERRING PHYSICIAN: [place]  CHIEF COMPLAINT:   Chief Complaint  Patient presents with  . Seizures    HISTORY OF PRESENT ILLNESS: Zachary Peterson  is a 83 y.o. male with a known history of  Seizure disorder, COPD, depression, essential hypertension, hyperlipidemia, history of migraines as well as remote history of atrial fibrillation with has had issues with his back hurting and has not been able to ambulate much. Who started feeling nauseous and throwing up since this morning. Patient subsequently had a episode of seizure witnessed by family. He was brought to the emergency room. Patient on arrival was noted to be tachycardic. He was given IV fluid boluses heart rate continues to be elevated his nausea vomiting and diarrhea have resolved since. However his heart rate continues to be elevated. Patient also noticed to have troponin was elevated at 0.70. He is denying any chest pains or palpitations. No shortness of breath. States that his abdomen feels weird. PAST MEDICAL HISTORY:   Past Medical History:  Diagnosis Date  . Atrial fibrillation (HCC)   . COPD (chronic obstructive pulmonary disease) (HCC)   . Depression   . Hearing loss   . Hyperlipemia   . Hypertension   . Migraine   . Seizure (HCC)     PAST SURGICAL HISTORY:  Past Surgical History:  Procedure Laterality Date  . APPENDECTOMY    . BACK SURGERY    . CATARACT EXTRACTION    . hemmoridectomy    . HERNIA REPAIR      SOCIAL HISTORY:  Social History  Substance Use Topics  . Smoking status: Former Games developer  . Smokeless tobacco: Former Neurosurgeon  . Alcohol use No    FAMILY HISTORY:  Family History  Problem Relation Age of Onset  . Heart disease Mother   . Hypertension Father     DRUG ALLERGIES:   Allergies  Allergen Reactions  . Aspirin Other (See Comments)    CAUSES ULCERS  . Codeine Nausea And Vomiting  . Oxycodone Nausea And Vomiting    SEIZURES    REVIEW OF SYSTEMS:   CONSTITUTIONAL: No fever, fatigue or weakness.  EYES: No blurred or double vision.  EARS, NOSE, AND THROAT: No tinnitus or ear pain.  RESPIRATORY: No cough, shortness of breath, wheezing or hemoptysis.  CARDIOVASCULAR: No chest pain, orthopnea, edema.  GASTROINTESTINAL:Positive nausea, vomiting, and diarrhea earlier or abdominal pain.  GENITOURINARY: No dysuria, hematuria.  ENDOCRINE: No polyuria, nocturia,  HEMATOLOGY: No anemia, easy bruising or bleeding SKIN: No rash or lesion. MUSCULOSKELETAL: No joint pain or arthritis.   NEUROLOGIC: No tingling, numbness, weakness.  PSYCHIATRY: No anxiety or depression.   MEDICATIONS AT HOME:  Prior to Admission medications   Medication Sig Start Date End Date Taking? Authorizing Provider  amLODipine (NORVASC) 10 MG tablet Take 10 mg by mouth daily.   Yes Historical Provider, MD  diazepam (VALIUM) 5 MG tablet Take 5 mg by mouth 2 (two) times daily. ANXIETY   Yes Historical Provider, MD  DILANTIN 100 MG ER capsule Take 100 mg by mouth 3 (three) times daily. 04/12/14  Yes Historical Provider, MD  ezetimibe-simvastatin (VYTORIN) 10-80 MG per tablet Take 1 tablet by mouth every evening.    Yes Historical Provider, MD  fluticasone (FLONASE) 50 MCG/ACT nasal  spray Place 1 spray into both nostrils daily.   Yes Historical Provider, MD  hydrochlorothiazide (HYDRODIURIL) 25 MG tablet Take 25 mg by mouth daily.   Yes Historical Provider, MD  hydroxypropyl methylcellulose / hypromellose (ISOPTO TEARS / GONIOVISC) 2.5 % ophthalmic solution Place 1 drop into both eyes 3 (three) times daily as needed for dry eyes.   Yes Historical Provider, MD  ibuprofen (ADVIL,MOTRIN) 200 MG tablet Take 200 mg by mouth every 6 (six) hours as needed for headache or moderate pain.    Yes Historical  Provider, MD  mirtazapine (REMERON) 15 MG tablet Take 15 mg by mouth at bedtime.   Yes Historical Provider, MD  ondansetron (ZOFRAN ODT) 8 MG disintegrating tablet Take 1 tablet (8 mg total) by mouth every 8 (eight) hours as needed for nausea or vomiting. 04/13/14  Yes Tatyana Kirichenko, PA-C  pregabalin (LYRICA) 75 MG capsule Take 75 mg by mouth 2 (two) times daily.   Yes Historical Provider, MD  tiotropium (SPIRIVA) 18 MCG inhalation capsule Place 18 mcg into inhaler and inhale daily.   Yes Historical Provider, MD  HYDROcodone-acetaminophen (NORCO) 5-325 MG per tablet Take 1 tablet by mouth every 6 (six) hours as needed for moderate pain. 04/13/14   Tatyana Kirichenko, PA-C      PHYSICAL EXAMINATION:   VITAL SIGNS: Blood pressure (!) 97/48, pulse (!) 132, temperature 97.6 F (36.4 C), temperature source Oral, resp. rate 20, height 5\' 10"  (1.778 m), weight 149 lb (67.6 kg), SpO2 (!) 88 %.  GENERAL:  83 y.o.-year-old patient lying in the bed with no acute distress.  EYES: Pupils equal, round, reactive to light and accommodation. No scleral icterus. Extraocular muscles intact.  HEENT: Head atraumatic, normocephalic. Oropharynx and nasopharynx clear.  NECK:  Supple, no jugular venous distention. No thyroid enlargement, no tenderness.  LUNGS: Normal breath sounds bilaterally, no wheezing, rales,rhonchi or crepitation. No use of accessory muscles of respiration.  CARDIOVASCULAR: Tachycardic normal. No murmurs, rubs, or gallops.  ABDOMEN: Soft, nontender, nondistended. Bowel sounds present. No organomegaly or mass.  EXTREMITIES: No pedal edema, cyanosis, or clubbing.  NEUROLOGIC: Cranial nerves II through XII are intact. Muscle strength 5/5 in all extremities. Sensation intact. Gait not checked.  PSYCHIATRIC: The patient is alert and oriented x 3.  SKIN: No obvious rash, lesion, or ulcer.   LABORATORY PANEL:   CBC  Recent Labs Lab 02/20/16 1518  WBC 10.7*  HGB 15.1  HCT 43.7  PLT 243   MCV 93.5  MCH 32.2  MCHC 34.4  RDW 14.0  LYMPHSABS 0.2*  MONOABS 0.3  EOSABS 0.0  BASOSABS 0.0   ------------------------------------------------------------------------------------------------------------------  Chemistries   Recent Labs Lab 02/20/16 1518 02/20/16 1857  NA 137  --   K 3.4*  --   CL 99*  --   CO2 27  --   GLUCOSE 128*  --   BUN 23*  --   CREATININE 1.47*  --   CALCIUM 8.5*  --   MG  --  2.1   ------------------------------------------------------------------------------------------------------------------ estimated creatinine clearance is 37 mL/min (by C-G formula based on SCr of 1.47 mg/dL (H)). ------------------------------------------------------------------------------------------------------------------ No results for input(s): TSH, T4TOTAL, T3FREE, THYROIDAB in the last 72 hours.  Invalid input(s): FREET3   Coagulation profile No results for input(s): INR, PROTIME in the last 168 hours. ------------------------------------------------------------------------------------------------------------------- No results for input(s): DDIMER in the last 72 hours. -------------------------------------------------------------------------------------------------------------------  Cardiac Enzymes  Recent Labs Lab 02/20/16 1518  TROPONINI 0.70*   ------------------------------------------------------------------------------------------------------------------ Invalid input(s): POCBNP  ---------------------------------------------------------------------------------------------------------------  Urinalysis  Component Value Date/Time   COLORURINE YELLOW 05/17/2009 1257   APPEARANCEUR CLEAR 05/17/2009 1257   LABSPEC 1.015 05/17/2009 1257   PHURINE 6.0 05/17/2009 1257   GLUCOSEU NEGATIVE 05/17/2009 1257   HGBUR NEGATIVE 05/17/2009 1257   BILIRUBINUR NEGATIVE 05/17/2009 1257   KETONESUR NEGATIVE 05/17/2009 1257   PROTEINUR 30 (A) 05/17/2009  1257   UROBILINOGEN 0.2 05/17/2009 1257   NITRITE NEGATIVE 05/17/2009 1257   LEUKOCYTESUR NEGATIVE 05/17/2009 1257     RADIOLOGY: No results found.  EKG: Orders placed or performed during the hospital encounter of 02/20/16  . ED EKG  . ED EKG  . EKG 12-Lead  . EKG 12-Lead  . EKG 12-Lead  . EKG 12-Lead  . EKG 12-Lead  . EKG 12-Lead    IMPRESSION AND PLAN: Patient is a 83 year old white male presenting with gastroenteritis but now noted to have a troponin of 0.70  1. Suspected non-ST MI Emergency room physician has already spoken to Dr. Kirke CorinArida of cardiology he recommends treating patient with IV heparin they will see the patient in the morning Obtain echocardiogram of the heart  2. Elevated heart rate with history of atrial fibrillation appears to be very regular however could be atrial fibrillation I will give patient a dose of digoxin and for now I will start patient on amiodarone drip  3. Seizure disorder with seizure Dilantin is currently therapeutic I will give him extra dose of Dilantin tonight  4. Hypokalemia we'll replace potassium and his fluids  5. Hypotension will aggressively hydrate him. Hold blood pressure medications  All the records are reviewed and case discussed with ED provider. Management plans discussed with the patient, family and they are in agreement.  CODE STATUS: Code Status History    This patient does not have a recorded code status. Please follow your organizational policy for patients in this situation.       TOTAL TIME TAKING CARE OF THIS PATIENT:55 minutes.    Auburn BilberryPATEL, Mavery Milling M.D on 02/20/2016 at 7:26 PM  Between 7am to 6pm - Pager - [phone]  After 6pm go to [email or web address] - password [email or web address] [facility]  [facility]  Office  [phone]  CC: Primary care physician; Ignacia PalmaBECK,MARK C., MD

## 2016-02-20 NOTE — ED Notes (Addendum)
meds given with heart rate of 123.  No chest pain or sob.  Skin warm and dry.  2nd iv started.  md at bedside to talk with family about concerns.  Family had concerns about cardiologist not seeing pt tonight in er and pt going to telemetry floor instead of ccu.   Dr Jeanene Erbforback and primedoc aware.  Dr York Ceriseforbach spoke with pt and family about those concerns.

## 2016-02-21 ENCOUNTER — Inpatient Hospital Stay (HOSPITAL_COMMUNITY)
Admit: 2016-02-21 | Discharge: 2016-02-21 | Disposition: A | Discharge status: Home / Self Care | Payer: Medicare HMO | Attending: Internal Medicine | Admitting: Internal Medicine

## 2016-02-21 ENCOUNTER — Encounter: Payer: Self-pay | Admitting: Physician Assistant

## 2016-02-21 DIAGNOSIS — R079 Chest pain, unspecified: Secondary | ICD-10-CM

## 2016-02-21 DIAGNOSIS — I10 Essential (primary) hypertension: Secondary | ICD-10-CM

## 2016-02-21 DIAGNOSIS — I4891 Unspecified atrial fibrillation: Secondary | ICD-10-CM

## 2016-02-21 DIAGNOSIS — I248 Other forms of acute ischemic heart disease: Secondary | ICD-10-CM

## 2016-02-21 DIAGNOSIS — E876 Hypokalemia: Secondary | ICD-10-CM

## 2016-02-21 DIAGNOSIS — R569 Unspecified convulsions: Secondary | ICD-10-CM

## 2016-02-21 DIAGNOSIS — A084 Viral intestinal infection, unspecified: Secondary | ICD-10-CM | POA: Diagnosis present

## 2016-02-21 LAB — BASIC METABOLIC PANEL
ANION GAP: 6 (ref 5–15)
BUN: 15 mg/dL (ref 6–20)
CHLORIDE: 105 mmol/L (ref 101–111)
CO2: 26 mmol/L (ref 22–32)
Calcium: 7.6 mg/dL — ABNORMAL LOW (ref 8.9–10.3)
Creatinine, Ser: 1.21 mg/dL (ref 0.61–1.24)
GFR calc non Af Amer: 54 mL/min — ABNORMAL LOW (ref >60)
Glucose, Bld: 102 mg/dL — ABNORMAL HIGH (ref 65–99)
POTASSIUM: 3.2 mmol/L — AB (ref 3.5–5.1)
Sodium: 137 mmol/L (ref 135–145)

## 2016-02-21 LAB — GASTROINTESTINAL PANEL BY PCR, STOOL (REPLACES STOOL CULTURE)
ADENOVIRUS F40/41: NOT DETECTED
Astrovirus: NOT DETECTED
CRYPTOSPORIDIUM: NOT DETECTED
Campylobacter species: NOT DETECTED
Cyclospora cayetanensis: NOT DETECTED
ENTEROAGGREGATIVE E COLI (EAEC): NOT DETECTED
ENTEROPATHOGENIC E COLI (EPEC): NOT DETECTED
Entamoeba histolytica: NOT DETECTED
Enterotoxigenic E coli (ETEC): NOT DETECTED
GIARDIA LAMBLIA: NOT DETECTED
NOROVIRUS GI/GII: DETECTED — AB
Plesimonas shigelloides: NOT DETECTED
ROTAVIRUS A: NOT DETECTED
SALMONELLA SPECIES: NOT DETECTED
SHIGELLA/ENTEROINVASIVE E COLI (EIEC): NOT DETECTED
Sapovirus (I, II, IV, and V): NOT DETECTED
Shiga like toxin producing E coli (STEC): NOT DETECTED
Vibrio cholerae: NOT DETECTED
Vibrio species: NOT DETECTED
YERSINIA ENTEROCOLITICA: NOT DETECTED

## 2016-02-21 LAB — URINALYSIS, COMPLETE (UACMP) WITH MICROSCOPIC
BILIRUBIN URINE: NEGATIVE
Bacteria, UA: NONE SEEN
Glucose, UA: NEGATIVE mg/dL
Ketones, ur: NEGATIVE mg/dL
LEUKOCYTES UA: NEGATIVE
NITRITE: NEGATIVE
PH: 5 (ref 5.0–8.0)
Protein, ur: NEGATIVE mg/dL
RBC / HPF: NONE SEEN RBC/hpf (ref 0–5)
SPECIFIC GRAVITY, URINE: 1.008 (ref 1.005–1.030)
Squamous Epithelial / LPF: NONE SEEN
WBC, UA: NONE SEEN WBC/hpf (ref 0–5)

## 2016-02-21 LAB — ECHOCARDIOGRAM COMPLETE
Height: 67 in
WEIGHTICAEL: 2305.6 [oz_av]

## 2016-02-21 LAB — CBC
HCT: 39.1 % — ABNORMAL LOW (ref 40.0–52.0)
Hemoglobin: 13.7 g/dL (ref 13.0–18.0)
MCH: 32.5 pg (ref 26.0–34.0)
MCHC: 35 g/dL (ref 32.0–36.0)
MCV: 93 fL (ref 80.0–100.0)
Platelets: 211 10*3/uL (ref 150–440)
RBC: 4.2 MIL/uL — ABNORMAL LOW (ref 4.40–5.90)
RDW: 14.2 % (ref 11.5–14.5)
WBC: 7 10*3/uL (ref 3.8–10.6)

## 2016-02-21 LAB — DIGOXIN LEVEL: DIGOXIN LVL: 0.4 ng/mL — AB (ref 0.8–2.0)

## 2016-02-21 LAB — TROPONIN I
TROPONIN I: 0.03 ng/mL — AB (ref <0.03)
Troponin I: 0.03 ng/mL (ref <0.03)
Troponin I: 0.04 ng/mL (ref <0.03)

## 2016-02-21 LAB — C DIFFICILE QUICK SCREEN W PCR REFLEX
C DIFFICILE (CDIFF) INTERP: NOT DETECTED
C Diff antigen: NEGATIVE
C Diff toxin: NEGATIVE

## 2016-02-21 LAB — HEPARIN LEVEL (UNFRACTIONATED)
HEPARIN UNFRACTIONATED: 0.34 [IU]/mL (ref 0.30–0.70)
Heparin Unfractionated: 0.24 IU/mL — ABNORMAL LOW (ref 0.30–0.70)
Heparin Unfractionated: 0.36 IU/mL (ref 0.30–0.70)

## 2016-02-21 LAB — MAGNESIUM: Magnesium: 1.8 mg/dL (ref 1.7–2.4)

## 2016-02-21 MED ORDER — CALCIUM CARBONATE ANTACID 500 MG PO CHEW
1.0000 | CHEWABLE_TABLET | Freq: Three times a day (TID) | ORAL | Status: DC | PRN
  Filled 2016-02-21: qty 1

## 2016-02-21 MED ORDER — CALCIUM CARBONATE ANTACID 500 MG PO CHEW: 1.0000 | CHEWABLE_TABLET | Freq: Three times a day (TID) | ORAL | Status: DC

## 2016-02-21 MED ORDER — AMIODARONE HCL 200 MG PO TABS
400.0000 mg | ORAL_TABLET | Freq: Two times a day (BID) | ORAL | Status: DC
  Administered 2016-02-21: 400 mg via ORAL
  Filled 2016-02-21: qty 2

## 2016-02-21 MED ORDER — LOPERAMIDE HCL 2 MG PO CAPS
2.0000 mg | ORAL_CAPSULE | ORAL | Status: DC | PRN
  Administered 2016-02-21 – 2016-02-22 (×3): 2 mg via ORAL
  Filled 2016-02-21 (×3): qty 1

## 2016-02-21 MED ORDER — ASPIRIN EC 81 MG PO TBEC
81.0000 mg | DELAYED_RELEASE_TABLET | Freq: Every day | ORAL | Status: DC
  Administered 2016-02-21 – 2016-02-24 (×4): 81 mg via ORAL
  Filled 2016-02-21 (×4): qty 1

## 2016-02-21 MED ORDER — METOPROLOL TARTRATE 25 MG PO TABS
12.5000 mg | ORAL_TABLET | Freq: Two times a day (BID) | ORAL | Status: DC
  Filled 2016-02-21: qty 1

## 2016-02-21 MED ORDER — AMIODARONE HCL 200 MG PO TABS: 400.0000 mg | ORAL_TABLET | Freq: Two times a day (BID) | ORAL | Status: DC

## 2016-02-21 MED ORDER — HEPARIN BOLUS VIA INFUSION
1000.0000 [IU] | Freq: Once | INTRAVENOUS | Status: AC
  Administered 2016-02-21: 1000 [IU] via INTRAVENOUS
  Filled 2016-02-21: qty 1000

## 2016-02-21 MED ORDER — METOPROLOL TARTRATE 25 MG PO TABS
12.5000 mg | ORAL_TABLET | Freq: Four times a day (QID) | ORAL | Status: DC
  Administered 2016-02-21 (×2): 12.5 mg via ORAL
  Filled 2016-02-21 (×3): qty 1

## 2016-02-21 MED ORDER — POTASSIUM CHLORIDE CRYS ER 20 MEQ PO TBCR
40.0000 meq | EXTENDED_RELEASE_TABLET | ORAL | Status: AC
  Administered 2016-02-21 (×2): 40 meq via ORAL
  Filled 2016-02-21 (×2): qty 2

## 2016-02-21 NOTE — Evaluation (Signed)
Physical Therapy Evaluation Patient Details Name: Zachary Peterson MRN: [id number] DOB: 10-21-1931 Today's Date: 02/21/2016   History of Present Illness  Pt is a in 83 year old man with history of seizure disorder, found to be in atrial fibrillation with rapid ventricular response with mild troponin elevation in the setting of seizure, diarrhea, and dysuria/urinary frequency. EKG also demonstrates atrial fibrillation with rapid ventricular response, RSR' in V1, and nonspecific ST/T changes. Patient's minimal troponin elevation with lack of chest pain and shortness of breath likely represents supply-demand mismatch in the setting of his tachycardia and acute illness.   Pt/family also report pt has a history of back problems with resulting pain and RLE weakness and has a surgical consult within the next week or so.     Clinical Impression  Pt presents with deficits in transfers, gait, balance, and activity tolerance.  Pt Ind with bed mobility tasks.  Pt CGA with transfers but steady and confident.  Pt fatigued quickly during amb but was able to amb 2 x 25' with RW and CGA with SpO2 dropping from 96% at baseline to 95% and HR increasing from 95 bpm at baseline to 115 bpm.  Pt will benefit from PT services to address above deficits for decreased caregiver assistance upon discharge.      Follow Up Recommendations Home health PT    Equipment Recommendations  Rolling walker with 5" wheels    Recommendations for Other Services       Precautions / Restrictions Precautions Precautions: Fall Restrictions Weight Bearing Restrictions: No      Mobility  Bed Mobility Overal bed mobility: Independent                Transfers Overall transfer level: Needs assistance   Transfers: Sit to/from Stand Sit to Stand: Min guard         General transfer comment: Pt steady with transfers without LOB, CGA secondary to recent seizure activity  Ambulation/Gait Ambulation/Gait assistance: Min  guard Ambulation Distance (Feet): 25 Feet Assistive device: Rolling walker (2 wheeled) Gait Pattern/deviations: Decreased step length - right;Decreased step length - left;Trunk flexed   Gait velocity interpretation: Below normal speed for age/gender    Stairs            Wheelchair Mobility    Modified Rankin (Stroke Patients Only)       Balance Overall balance assessment: Needs assistance Sitting-balance support: No upper extremity supported Sitting balance-Leahy Scale: Good     Standing balance support: Bilateral upper extremity supported Standing balance-Leahy Scale: Good Standing balance comment: Static balance training with feet apart and together with combinations of eyes open/closed and head still/head turns with min instability with eyes closed.  Dynamic standing balance training with feet apart with reaching outside BOS with good stability.                             Pertinent Vitals/Pain Pain Assessment: No/denies pain    Home Living Family/patient expects to be discharged to:: Private residence Living Arrangements: Spouse/significant other Available Help at Discharge: Family;Available 24 hours/day Type of Home: House Home Access: Stairs to enter Entrance Stairs-Rails: Right;Left   Home Layout: One level Home Equipment: Cane - single point      Prior Function Level of Independence: Independent         Comments: Pt Ind with amb without AD in home and with SPC in community, Ind with ADLs     Hand Dominance  Dominant Hand: Right    Extremity/Trunk Assessment        Lower Extremity Assessment Lower Extremity Assessment: Generalized weakness       Communication   Communication: No difficulties  Cognition Arousal/Alertness: Awake/alert Behavior During Therapy: WFL for tasks assessed/performed Overall Cognitive Status: Within Functional Limits for tasks assessed                      General Comments       Exercises Other Exercises Other Exercises: Static balance training with feet apart and together with combinations of eyes open/closed and head still/head turns. Other Exercises: Marching in place 2 x 10 Other Exercises: Dynamic standing balance training with feet apart and reaching outside BOS   Assessment/Plan    PT Assessment Patient needs continued PT services  PT Problem List Decreased activity tolerance;Decreased balance          PT Treatment Interventions DME instruction;Gait training;Stair training;Functional mobility training;Neuromuscular re-education;Balance training;Therapeutic exercise;Therapeutic activities;Patient/family education    PT Goals (Current goals can be found in the Care Plan section)  Acute Rehab PT Goals Patient Stated Goal: To be able to walk outside PT Goal Formulation: With patient Time For Goal Achievement: 03/05/16 Potential to Achieve Goals: Good    Frequency Min 2X/week   Barriers to discharge        Co-evaluation               End of Session Equipment Utilized During Treatment: Gait belt Activity Tolerance: Patient tolerated treatment well Patient left: in bed;with bed alarm set;with family/visitor present;with call bell/phone within reach           Time: [id number] PT Time Calculation (min) (ACUTE ONLY): 30 min   Charges:   PT Evaluation $PT Eval Low Complexity: 1 Procedure PT Treatments $Therapeutic Exercise: 8-22 mins   PT G Codes:        Elly Modena. Scott Kaidyn Javid PT, DPT 02/21/16, 3:43 PM

## 2016-02-21 NOTE — Care Management Important Message (Signed)
Important Message  Patient Details  Name: Derrick RavelSamuel D Goedecke MRN: [id number] Date of Birth: 1931-04-11   Medicare Important Message Given:  Yes  Initial signed IM printed from Epic and given to patient.   Eber HongGreene, Tamirah George R, RN 02/21/2016, 2:26 PM

## 2016-02-21 NOTE — Plan of Care (Signed)
Problem: Safety: Goal: Ability to remain free from injury will improve Outcome: Progressing Bed alarm on when family not present. Side rails up x2.

## 2016-02-21 NOTE — Consult Note (Signed)
ANTICOAGULATION CONSULT NOTE - FOLLOW UP  Pharmacy Consult for heparin drip Indication: chest pain/ACS  Allergies  Allergen Reactions  . Aspirin Other (See Comments)    CAUSES ULCERS  . Codeine Nausea And Vomiting  . Oxycodone Nausea And Vomiting    SEIZURES    Patient Measurements: Height: 5\' 7"  (170.2 cm) Weight: 144 lb 1.6 oz (65.4 kg) IBW/kg (Calculated) : 66.1 Heparin Dosing Weight: 67.6kg  Vital Signs: Temp: 98 F (36.7 C) (02/13 1119) Temp Source: Oral (02/13 1119) BP: 118/75 (02/13 1119) Pulse Rate: 135 (02/13 1119)  Labs:  Recent Labs  02/20/16 1518 02/20/16 2013 02/20/16 2306 02/21/16 0340 02/21/16 1047 02/21/16 1314  HGB 15.1  --   --  13.7  --   --   HCT 43.7  --   --  39.1*  --   --   PLT 243  --   --  211  --   --   APTT  --  155*  --   --   --   --   LABPROT  --  15.1  --   --   --   --   INR  --  1.18  --   --   --   --   HEPARINUNFRC  --   --   --  0.24*  --  0.34  CREATININE 1.47*  --   --  1.21  --   --   TROPONINI 0.70*  --  0.03* 0.04* 0.03*  --     Estimated Creatinine Clearance: 43.5 mL/min (by C-G formula based on SCr of 1.21 mg/dL).   Medical History: Past Medical History:  Diagnosis Date  . Back pain   . COPD (chronic obstructive pulmonary disease) (HCC)   . Depression   . Hearing loss   . Hyperlipemia   . Hypertension   . Migraine   . Palpitations   . Seizure (HCC)   . Syncope    a. related to seizure disorder    Medications:  Scheduled:  . aspirin EC  81 mg Oral Daily  . atorvastatin  40 mg Oral q1800  . diazepam  5 mg Oral BID  . metoprolol tartrate  12.5 mg Oral QID  . mirtazapine  15 mg Oral QHS  . phenytoin  100 mg Oral TID  . potassium chloride  40 mEq Oral Q4H  . pregabalin  75 mg Oral BID  . sodium chloride flush  3 mL Intravenous Q12H  . tiotropium  18 mcg Inhalation Daily    Assessment: Pt is a 83 year old male with a PMH of seizures, Afib not on anticoagulation who presents after having a seizure  after acute onset of vomiting and diarrhea. Pt first troponin resulted at 0.70. Pharmacy consulted to dose heparin drip.  Goal of Therapy:  Heparin level 0.3-0.7 units/ml Monitor platelets by anticoagulation protocol: Yes   Plan:  Give 4000 units bolus x 1 Start heparin infusion at 800 units/hr Check anti-Xa level in 8 hours and daily while on heparin Continue to monitor H&H and platelets   2/13 0340 HL subtherapeutic x 1. 1000 units IV x 1 bolus and increase rate to 950 units/hr. Recheck HL in 8 hours.  2/13 Heparin level @ 0.34. Will continue heparin gtt and will recheck HL @ 21:00.   Demetrius Charityeldrin D. Vivek Grealish, PharmD  Clinical Pharmacist 02/21/2016,2:52 PM

## 2016-02-21 NOTE — Consult Note (Signed)
Cardiology Consultation Note  Patient ID: Zachary Peterson, MRN: [id number], DOB/AGE: 1931/07/04 83 y.o. Admit date: 02/20/2016   Date of Consult: 02/21/2016 Primary Physician: Ignacia Palma., MD Primary Cardiologist: New to [facility] - consult by End Requesting Physician: Dr. Allena Katz, MD  Chief Complaint: Seizure Reason for Consult: New onset Afib with RVR  HPI: 83 y.o. male with h/o seizure disorder with syncope, prior GI bleeding 2/2 ulcer, palpitations with patient and family reported PAF though without prior documented evidence of arrhythmia noted in Epic, HTN, HLD, migraine disorder, COPD 2/2 prior tobacco abuse, and chronic back pain who presented to [facility] on 2/12 s/p seizure and was noted to be in new onset Afib with RVR.  Patient and his granddaughter report a prior history of Afib though he has not seen a cardiologist for this. He reports seeing a cardiologist many years ago in [facility] for an echo, otherwise no prior cardiac evaluations. Prior to his seizure on 2/12 he last had a seizure 1 year ago. Over the past couple of days the patient has been dealing with viral gastroenteritis with nausea, vomiting, and watery diarrhea. He had a large watery BM on 2/12 followed by a witnessed seizure upon returning back to his family. No preceding chest pain, SOB, palpitations, or dizziness. Upon the patient's arrival to [facility] they were found to have an initial troponin of 0.70-->0.03-->0.04, potassium 3.4-->3.2 s/p IV repletion, WBC 10.7-->7.0, hgb 15.1, plt 243, Mg++ 2.1, TSH normal. No CT head or CXR was performed in the ED. He was noted to be in new onset Afib with RVR with heart rates in the 130's bpm. He was started on IV amiodarone and given a digoxin load. Heart rates improved to the low 100's with intermittent episodes into the 120's bpm. He has remained in Afib and is currently asymptomatic. He reports to me never having chest pain or SOB with the above. Echo is pending.   Past Medical History:    Diagnosis Date  . Back pain   . COPD (chronic obstructive pulmonary disease) (HCC)   . Depression   . Hearing loss   . Hyperlipemia   . Hypertension   . Migraine   . Palpitations   . Seizure (HCC)   . Syncope    a. related to seizure disorder      Most Recent Cardiac Studies: none   Surgical History:  Past Surgical History:  Procedure Laterality Date  . APPENDECTOMY    . BACK SURGERY    . CATARACT EXTRACTION    . hemmoridectomy    . HERNIA REPAIR       Home Meds: Prior to Admission medications   Medication Sig Start Date End Date Taking? Authorizing Provider  amLODipine (NORVASC) 10 MG tablet Take 10 mg by mouth daily.   Yes Historical Provider, MD  diazepam (VALIUM) 5 MG tablet Take 5 mg by mouth 2 (two) times daily. ANXIETY   Yes Historical Provider, MD  DILANTIN 100 MG ER capsule Take 100 mg by mouth 3 (three) times daily. 04/12/14  Yes Historical Provider, MD  ezetimibe-simvastatin (VYTORIN) 10-80 MG per tablet Take 1 tablet by mouth every evening.    Yes Historical Provider, MD  fluticasone (FLONASE) 50 MCG/ACT nasal spray Place 1 spray into both nostrils daily.   Yes Historical Provider, MD  hydrochlorothiazide (HYDRODIURIL) 25 MG tablet Take 25 mg by mouth daily.   Yes Historical Provider, MD  hydroxypropyl methylcellulose / hypromellose (ISOPTO TEARS / GONIOVISC) 2.5 % ophthalmic solution Place  1 drop into both eyes 3 (three) times daily as needed for dry eyes.   Yes Historical Provider, MD  ibuprofen (ADVIL,MOTRIN) 200 MG tablet Take 200 mg by mouth every 6 (six) hours as needed for headache or moderate pain.    Yes Historical Provider, MD  mirtazapine (REMERON) 15 MG tablet Take 15 mg by mouth at bedtime.   Yes Historical Provider, MD  ondansetron (ZOFRAN ODT) 8 MG disintegrating tablet Take 1 tablet (8 mg total) by mouth every 8 (eight) hours as needed for nausea or vomiting. 04/13/14  Yes Tatyana Kirichenko, PA-C  pregabalin (LYRICA) 75 MG capsule Take 75 mg by mouth  2 (two) times daily.   Yes Historical Provider, MD  tiotropium (SPIRIVA) 18 MCG inhalation capsule Place 18 mcg into inhaler and inhale daily.   Yes Historical Provider, MD  HYDROcodone-acetaminophen (NORCO) 5-325 MG per tablet Take 1 tablet by mouth every 6 (six) hours as needed for moderate pain. 04/13/14   Jaynie Crumble, PA-C    Inpatient Medications:  . aspirin EC  81 mg Oral Daily  . atorvastatin  40 mg Oral q1800  . calcium carbonate  1 tablet Oral TID  . diazepam  5 mg Oral BID  . metoprolol tartrate  12.5 mg Oral QID  . mirtazapine  15 mg Oral QHS  . phenytoin  100 mg Oral TID  . potassium chloride  40 mEq Oral Q4H  . pregabalin  75 mg Oral BID  . sodium chloride flush  3 mL Intravenous Q12H  . tiotropium  18 mcg Inhalation Daily   . 0.9 % NaCl with KCl 40 mEq / L 100 mL/hr (02/21/16 0940)  . heparin 950 Units/hr (02/21/16 0525)    Allergies:  Allergies  Allergen Reactions  . Aspirin Other (See Comments)    CAUSES ULCERS  . Codeine Nausea And Vomiting  . Oxycodone Nausea And Vomiting    SEIZURES    Social History   Social History  . Marital status: Married    Spouse name: N/A  . Number of children: N/A  . Years of education: N/A   Occupational History  . Not on file.   Social History Main Topics  . Smoking status: Former Games developer  . Smokeless tobacco: Former Neurosurgeon  . Alcohol use No  . Drug use: No  . Sexual activity: No   Other Topics Concern  . Not on file   Social History Narrative  . No narrative on file     Family History  Problem Relation Age of Onset  . Heart disease Mother   . Hypertension Father      Review of Systems: Review of Systems  Constitutional: Positive for malaise/fatigue. Negative for chills, diaphoresis, fever and weight loss.  HENT: Negative for congestion.   Eyes: Negative for discharge and redness.  Respiratory: Negative for cough, hemoptysis, sputum production, shortness of breath and wheezing.   Cardiovascular:  Negative for chest pain, palpitations, orthopnea, claudication, leg swelling and PND.  Gastrointestinal: Positive for abdominal pain, diarrhea, nausea and vomiting. Negative for blood in stool, constipation, heartburn and melena.  Genitourinary: Negative for hematuria.  Musculoskeletal: Negative for falls and myalgias.  Skin: Negative for rash.  Neurological: Positive for seizures and weakness. Negative for dizziness, tingling, tremors, sensory change, speech change, focal weakness, loss of consciousness and headaches.  Endo/Heme/Allergies: Does not bruise/bleed easily.  Psychiatric/Behavioral: Negative for substance abuse. The patient is not nervous/anxious.   All other systems reviewed and are negative.   Labs:  Recent  Labs  02/20/16 1518 02/20/16 2306 02/21/16 0340  TROPONINI 0.70* 0.03* 0.04*   Lab Results  Component Value Date   WBC 7.0 02/21/2016   HGB 13.7 02/21/2016   HCT 39.1 (L) 02/21/2016   MCV 93.0 02/21/2016   PLT 211 02/21/2016     Recent Labs Lab 02/21/16 0340  NA 137  K 3.2*  CL 105  CO2 26  BUN 15  CREATININE 1.21  CALCIUM 7.6*  GLUCOSE 102*   No results found for: CHOL, HDL, LDLCALC, TRIG No results found for: DDIMER  Radiology/Studies:  None performed at time of admission.   EKG: Interpreted by me showed: Afib with RVR, 105 bpm, poor R wave progression, nonspecific st/t changes Telemetry: Interpreted by me showed: Afib with RVR, low 100's to 120's bpm  Weights: Filed Weights   02/20/16 1511 02/20/16 2108  Weight: 149 lb (67.6 kg) 144 lb 1.6 oz (65.4 kg)     Physical Exam: Blood pressure 136/71, pulse (!) 101, temperature 98.6 F (37 C), temperature source Oral, resp. rate 18, height 5\' 7"  (1.702 m), weight 144 lb 1.6 oz (65.4 kg), SpO2 91 %. Body mass index is 22.57 kg/m. General: Frail appearing, in no acute distress. Head: Normocephalic, atraumatic, sclera non-icteric, no xanthomas, nares are without discharge.  Neck: Negative for  carotid bruits. JVD not elevated. Lungs: Clear bilaterally to auscultation without wheezes, rales, or rhonchi. Breathing is unlabored. Heart: Tachycardic, irregularly irregular with S1 S2. No murmurs, rubs, or gallops appreciated. Abdomen: Soft, non-tender, non-distended with normoactive bowel sounds. No hepatomegaly. No rebound/guarding. No obvious abdominal masses. Msk:  Strength and tone appear normal for age. Extremities: No clubbing or cyanosis. No edema. Distal pedal pulses are 2+ and equal bilaterally. Neuro: Alert and oriented X 3. No facial asymmetry. No focal deficit. Moves all extremities spontaneously. Psych:  Responds to questions appropriately with a normal affect.    Assessment and Plan:  Principal Problem:   Seizure (HCC) Active Problems:   Atrial fibrillation with RVR (HCC)   Viral gastroenteritis   Hypokalemia   Demand ischemia (HCC)   Essential hypertension    1. New onset Afib with RVR: -Patient and his granddaughter report a prior history of Afib that he has never seen a cardiologist for. There are no prior EKG and rhythm strips in Epic or Care Everywhere to review this -Remains with modest rate control in the low 100's bpm -Add Lopressor 12.5 mg qid for added rate control -Discontinue IV amiodarone infusion as his tachycardia is persisting -PO amiodarone -IV heparin per pharmacy -Titrate rate control to a goal of < 100 bpm -Unlikely a long term, full-dose anticoagulation candidate given prior history of GI with ulcer and seizure disorder complicated by syncope -CHADS2VASc at least 3 (HTN, age x 2) -Replete potassium to a goal of 4.0 -Magnesium and TSH ok -Check echo -No CXR -Given reported prior episodes of Afib would benefit from outpatient cardiac monitoring   2. Seizure disorder: -Known associated syncope -Consider CT head -[facility]Per IM  3. Hypokalemia: -Likely in the setting of his recent viral gastroenteritis  -Replete to 4.0  4.  Elevated troponin: -No symptoms of angina -Likely supply demand ischemia in the setting of seizure, Afib with RVR, and hypokalemia  -This is not a NSTEMI -Echo as above  5. Viral gastroenteritis: -Appears resolved  -Per IM  6. HTN: -Stable to soft    Signed, Eula ListenRyan Bethanie Bloxom, PA-C [facility] Pager: [phone] 02/21/2016, 11:10 AM

## 2016-02-21 NOTE — Progress Notes (Signed)
OT Cancellation Note  Patient Details Name: Zachary RavelSamuel D Salvo MRN: [id number] DOB: 05-04-1931   Cancelled Treatment:    Reason Eval/Treat Not Completed: Patient declined, no reason specified. Order received, chart reviewed. Upon entry into pt room, pt's family requesting additional blanket. OT retrieved blanket and pt then politely declining due to fatigue. Will re-attempt OT evaluation tomorrow morning as pt is available.   Eliezer BottomJamie L Stiller, OTR/L 02/21/2016, 3:35 PM

## 2016-02-21 NOTE — Progress Notes (Addendum)
Paged cardiology about patient's BP, currently 94/56. Metoprolol is scheduled four times a day currently. MD, Duke Salviaandolph stated to change metoprolol to bid instead. Patient is asymptomatic with BP. Heart rate currently in the 80's afib.

## 2016-02-21 NOTE — Progress Notes (Signed)
[employer] - [place] at [facility]   PATIENT NAME: Zachary Peterson    MR#:  [id number]  DATE OF BIRTH:  [birth year]  SUBJECTIVE:  CHIEF COMPLAINT:   Chief Complaint  Patient presents with  . Seizures   - admitted with seizure, new onset afib with rvr- off amiodarone drip -  Heat rte is better controlled.Change to oral amiodarone.  REVIEW OF SYSTEMS:  Review of Systems  Constitutional: Negative for chills and fever.  HENT: Positive for hearing loss. Negative for ear discharge, ear pain and nosebleeds.   Eyes: Negative for blurred vision.  Respiratory: Negative for cough, shortness of breath and wheezing.   Cardiovascular: Positive for palpitations. Negative for chest pain and leg swelling.  Gastrointestinal: Negative for abdominal pain, constipation, diarrhea, nausea and vomiting.  Genitourinary: Positive for dysuria.  Musculoskeletal: Positive for back pain and myalgias.  Neurological: Positive for seizures and weakness. Negative for dizziness, speech change, focal weakness and headaches.    DRUG ALLERGIES:   Allergies  Allergen Reactions  . Aspirin Other (See Comments)    CAUSES ULCERS  . Codeine Nausea And Vomiting  . Oxycodone Nausea And Vomiting    SEIZURES    VITALS:  Blood pressure 118/75, pulse (!) 135, temperature 98 F (36.7 C), temperature source Oral, resp. rate 18, height 5\' 7"  (1.702 m), weight 65.4 kg (144 lb 1.6 oz), SpO2 95 %.  PHYSICAL EXAMINATION:  Physical Exam  GENERAL:  83 y.o.-year-old patient lying in the bed with no acute distress.  EYES: Pupils equal, round, reactive to light and accommodation. No scleral icterus. Extraocular muscles intact.  HEENT: Head atraumatic, normocephalic. Oropharynx and nasopharynx clear.  NECK:  Supple, no jugular venous distention. No thyroid enlargement, no tenderness.  LUNGS: Normal breath sounds bilaterally, no wheezing, rales,rhonchi or crepitation. No use of accessory muscles of respiration.   Decreased bibasilar breath sounds CARDIOVASCULAR: S1, S2  Rapid rate and irregular rhythm. No murmurs, rubs, or gallops.  ABDOMEN: Soft, nontender, nondistended. Bowel sounds present. No organomegaly or mass.  EXTREMITIES: No pedal edema, cyanosis, or clubbing.  NEUROLOGIC: Cranial nerves II through XII are intact. Muscle strength 5/5 in all extremities. Sensation intact. Gait not checked.  Globally weak appearing PSYCHIATRIC: The patient is alert and oriented x 3.  SKIN: No obvious rash, lesion, or ulcer.    LABORATORY PANEL:   CBC  Recent Labs Lab 02/21/16 0340  WBC 7.0  HGB 13.7  HCT 39.1*  PLT 211   ------------------------------------------------------------------------------------------------------------------  Chemistries   Recent Labs Lab 02/20/16 1857 02/21/16 0340  NA  --  137  K  --  3.2*  CL  --  105  CO2  --  26  GLUCOSE  --  102*  BUN  --  15  CREATININE  --  1.21  CALCIUM  --  7.6*  MG 2.1  --    ------------------------------------------------------------------------------------------------------------------  Cardiac Enzymes  Recent Labs Lab 02/21/16 1047  TROPONINI 0.03*   ------------------------------------------------------------------------------------------------------------------  RADIOLOGY:  No results found.  EKG:   Orders placed or performed during the hospital encounter of 02/20/16  . EKG 12-Lead  . EKG 12-Lead  . EKG 12-Lead  . EKG 12-Lead  . EKG 12-Lead  . EKG 12-Lead  . EKG 12-Lead  . EKG 12-Lead  . EKG 12-Lead  . EKG 12-Lead    ASSESSMENT AND PLAN:    83 year old male with past medial history significant for seizure disorder, GERD with prior history of  peptic ulcers, remote history  of atrial fibrillation, COPD, Hypertension admitted after a seizure episode and gastroenteritis symptoms. Noted to be in Afib with rvr  #1 Afib with rvr- remote history, new onset now - likely triggered by seizure and  gastroenteritis - replace potassium, check mag level - Appreciate cards consult - on oral amiodarone now, off the amiodarone drip - started on low dose metoprolol - on Heparin now for anticoagulation, f/u ECHO - not a candidate for long term anticoagulation due to bleeding peptic ulcers and seizure diosrder  #2 Elevated troponin- likely demand ischemia from afib and elevated heart rate - decreased, f/u ECHO - on heparin drip  #3 Hypokalemia- replaced, check mag level  #4 Dysuria- check UA and culture  #5 [facility]ure disorder- [facility], continue phenytoin, also uses valium prn for aura  #6 Low back pain-scheduled to f/u with orthopedic surgery next week MRI done as outpt with significant disc disease - PT consult requested  #7 DVT prophylaxis- on heparin gtt for now    All the records are reviewed and case discussed with Care Management/Social Workerr. Management plans discussed with the patient, family and they are in agreement.  CODE STATUS: Full Code  TOTAL TIME TAKING CARE OF THIS PATIENT: 37 minutes.   POSSIBLE D/C IN 1-2 DAYS, DEPENDING ON CLINICAL CONDITION.   Eshaan Titzer M.D on 02/21/2016 at 1:20 PM  Between 7am to 6pm - Pager - [phone]  After 6pm go to [email or web address] - password [email or web address]  [facility] [place] [facility]  Office  [phone]  CC: Primary care physician; Ignacia PalmaBECK,MARK C., MD

## 2016-02-21 NOTE — Plan of Care (Signed)
Problem: Cardiac: Goal: Ability to achieve and maintain adequate cardiopulmonary perfusion will improve Outcome: Progressing Atrial fib continues with controlled rates noted on telemetry.

## 2016-02-21 NOTE — Progress Notes (Signed)
Patient's GI panel negative for C-diff but positive for Norovirus. Notified MD Willis and IV fluid rate updated; no new orders received. Nursing staff will continue to monitor for any changes in patient status. Lamonte RicherKara A Evona Westra, RN

## 2016-02-21 NOTE — Progress Notes (Signed)
ANTICOAGULATION CONSULT NOTE - Initial Consult  Pharmacy Consult for Heparin  Indication: chest pain/ACS  Allergies  Allergen Reactions  . Aspirin Other (See Comments)    CAUSES ULCERS  . Codeine Nausea And Vomiting  . Oxycodone Nausea And Vomiting    SEIZURES    Patient Measurements: Height: 5\' 7"  (170.2 cm) Weight: 144 lb 1.6 oz (65.4 kg) IBW/kg (Calculated) : 66.1 Heparin Dosing Weight: 67.6 kg   Vital Signs: Temp: 98.5 F (36.9 C) (02/13 1942) Temp Source: Oral (02/13 1942) BP: 98/56 (02/13 1942) Pulse Rate: 92 (02/13 1942)  Labs:  Recent Labs  02/20/16 1518 02/20/16 2013 02/20/16 2306 02/21/16 0340 02/21/16 1047 02/21/16 1314 02/21/16 2123  HGB 15.1  --   --  13.7  --   --   --   HCT 43.7  --   --  39.1*  --   --   --   PLT 243  --   --  211  --   --   --   APTT  --  155*  --   --   --   --   --   LABPROT  --  15.1  --   --   --   --   --   INR  --  1.18  --   --   --   --   --   HEPARINUNFRC  --   --   --  0.24*  --  0.34 0.36  CREATININE 1.47*  --   --  1.21  --   --   --   TROPONINI 0.70*  --  0.03* 0.04* 0.03*  --   --     Estimated Creatinine Clearance: 43.5 mL/min (by C-G formula based on SCr of 1.21 mg/dL).   Medical History: Past Medical History:  Diagnosis Date  . Back pain   . COPD (chronic obstructive pulmonary disease) (HCC)   . Depression   . Hearing loss   . Hyperlipemia   . Hypertension   . Migraine   . Palpitations   . Seizure (HCC)   . Syncope    a. related to seizure disorder    Medications:  Prescriptions Prior to Admission  Medication Sig Dispense Refill Last Dose  . amLODipine (NORVASC) 10 MG tablet Take 10 mg by mouth daily.   02/20/2016 at Unknown time  . diazepam (VALIUM) 5 MG tablet Take 5 mg by mouth 2 (two) times daily. ANXIETY   prn at prn  . DILANTIN 100 MG ER capsule Take 100 mg by mouth 3 (three) times daily.   02/19/2016 at Unknown time  . ezetimibe-simvastatin (VYTORIN) 10-80 MG per tablet Take 1 tablet by  mouth every evening.    02/19/2016 at Unknown time  . fluticasone (FLONASE) 50 MCG/ACT nasal spray Place 1 spray into both nostrils daily.   02/20/2016 at Unknown time  . hydrochlorothiazide (HYDRODIURIL) 25 MG tablet Take 25 mg by mouth daily.   02/20/2016 at Unknown time  . hydroxypropyl methylcellulose / hypromellose (ISOPTO TEARS / GONIOVISC) 2.5 % ophthalmic solution Place 1 drop into both eyes 3 (three) times daily as needed for dry eyes.   prn at prn  . ibuprofen (ADVIL,MOTRIN) 200 MG tablet Take 200 mg by mouth every 6 (six) hours as needed for headache or moderate pain.    prn at prn  . mirtazapine (REMERON) 15 MG tablet Take 15 mg by mouth at bedtime.   02/19/2016 at Unknown time  . ondansetron [facility](ZOFRAN  ODT) 8 MG disintegrating tablet Take 1 tablet (8 mg total) by mouth every 8 (eight) hours as needed for nausea or vomiting. 15 tablet 0 prn at prn  . pregabalin (LYRICA) 75 MG capsule Take 75 mg by mouth 2 (two) times daily.   02/20/2016 at Unknown time  . tiotropium (SPIRIVA) 18 MCG inhalation capsule Place 18 mcg into inhaler and inhale daily.   02/20/2016 at Unknown time  . HYDROcodone-acetaminophen (NORCO) 5-325 MG per tablet Take 1 tablet by mouth every 6 (six) hours as needed for moderate pain. 15 tablet 0 prn at prn    Assessment: Pharmacy consulted to dose heparin in this 83 year old male with ACS.  CrCl = 43.5 ml/min  Goal of Therapy:  Heparin level 0.3-0.7 units/ml Monitor platelets by anticoagulation protocol: Yes   Plan:  2/13 0340 HL subtherapeutic x 1. 1000 units IV x 1 bolus and increase rate to 950 units/hr. Recheck HL in 8 hours.  2/13 Heparin level @ 0.34. Will continue heparin gtt and will recheck HL @ 21:00.   2/13: HL @ 21:23 = 0.36 Will continue this pt on current rate of 950 units/hr and recheck HL on 2/14 with AM labs.     Neiva Maenza D 02/21/2016,10:00 PM

## 2016-02-21 NOTE — Progress Notes (Signed)
*  PRELIMINARY RESULTS* Echocardiogram 2D Echocardiogram has been performed.  Cristela BlueHege, Jhordan Kinter 02/21/2016, 2:14 PM

## 2016-02-21 NOTE — Progress Notes (Signed)
Patient's family reports that patient has had two episodes of diarrhea today. Patient had diarrhea before his seizure when he came in, but has not had the diarrhea for 24 hours, but it has started again. MD ordered to check cdiff and gi panel, and if they are negative can call for some immodium.

## 2016-02-21 NOTE — Consult Note (Signed)
ANTICOAGULATION CONSULT NOTE - Initial Consult  Pharmacy Consult for heparin drip Indication: chest pain/ACS  Allergies  Allergen Reactions  . Aspirin Other (See Comments)    CAUSES ULCERS  . Codeine Nausea And Vomiting  . Oxycodone Nausea And Vomiting    SEIZURES    Patient Measurements: Height: 5\' 7"  (170.2 cm) Weight: 144 lb 1.6 oz (65.4 kg) IBW/kg (Calculated) : 66.1 Heparin Dosing Weight: 67.6kg  Vital Signs: Temp: 98.6 F (37 C) (02/13 0348) Temp Source: Oral (02/13 0348) BP: 101/51 (02/13 0348) Pulse Rate: 91 (02/13 0348)  Labs:  Recent Labs  02/20/16 1518 02/20/16 2013 02/20/16 2306 02/21/16 0340  HGB 15.1  --   --  13.7  HCT 43.7  --   --  39.1*  PLT 243  --   --  211  APTT  --  155*  --   --   LABPROT  --  15.1  --   --   INR  --  1.18  --   --   HEPARINUNFRC  --   --   --  0.24*  CREATININE 1.47*  --   --  1.21  TROPONINI 0.70*  --  0.03* 0.04*    Estimated Creatinine Clearance: 43.5 mL/min (by C-G formula based on SCr of 1.21 mg/dL).   Medical History: Past Medical History:  Diagnosis Date  . Atrial fibrillation (HCC)   . COPD (chronic obstructive pulmonary disease) (HCC)   . Depression   . Hearing loss   . Hyperlipemia   . Hypertension   . Migraine   . Seizure (HCC)     Medications:  Scheduled:  . aspirin EC  325 mg Oral Daily  . atorvastatin  40 mg Oral q1800  . diazepam  5 mg Oral BID  . heparin  1,000 Units Intravenous Once  . mirtazapine  15 mg Oral QHS  . phenytoin  100 mg Oral TID  . pregabalin  75 mg Oral BID  . sodium chloride flush  3 mL Intravenous Q12H  . tiotropium  18 mcg Inhalation Daily    Assessment: Pt is a 83 year old male with a PMH of seizures, Afib not on anticoagulation who presents after having a seizure after acute onset of vomiting and diarrhea. Pt first troponin resulted at 0.70. Pharmacy consulted to dose heparin drip.  Goal of Therapy:  Heparin level 0.3-0.7 units/ml Monitor platelets by  anticoagulation protocol: Yes   Plan:  Give 4000 units bolus x 1 Start heparin infusion at 800 units/hr Check anti-Xa level in 8 hours and daily while on heparin Continue to monitor H&H and platelets   2/13 0340 HL subtherapeutic x 1. 1000 units IV x 1 bolus and increase rate to 950 units/hr. Recheck HL in 8 hours.  Jax Kentner A. [place], [place]arm.D., BCPS Clinical Pharmacist 02/21/2016,5:15 AM

## 2016-02-21 NOTE — Care Management Note (Signed)
Case Management Note  Patient Details  Name: Derrick RavelSamuel D Vetsch MRN: [id number] Date of Birth: 02-05-1931            Action/Plan: Presents from home for seizure.  Found to be in atrial fib and placed on amiodarone infusion.  Independent in all adls, is experiencing some difficulty with ambulation,  denies issues accessing medical care, obtaining medications or with transportation.  Current with  PCP.  Current 02 requirement is acute.  Discussed need to wean / home 02 assessment and PT consult during progression.  Lives with his wife and uses cane or walker at times.  Has chronic back issue that is affecting activity.  Anticipate discharge home.  PT and OT consults pending   Expected Discharge Date:                  Expected Discharge Plan:     In-House Referral:     Discharge planning Services  CM Consult  Post Acute Care Choice:    Choice offered to:     DME Arranged:    DME Agency:     HH Arranged:    HH Agency:     Status of Service:  In process, will continue to follow  If discussed at Long Length of Stay Meetings, dates discussed:    Additional Comments:  Eber HongGreene, Yemaya Barnier R, RN 02/21/2016, 9:46 AM

## 2016-02-22 DIAGNOSIS — A084 Viral intestinal infection, unspecified: Secondary | ICD-10-CM

## 2016-02-22 DIAGNOSIS — R197 Diarrhea, unspecified: Secondary | ICD-10-CM

## 2016-02-22 LAB — BASIC METABOLIC PANEL
ANION GAP: 5 (ref 5–15)
BUN: 11 mg/dL (ref 6–20)
CALCIUM: 7.8 mg/dL — AB (ref 8.9–10.3)
CO2: 25 mmol/L (ref 22–32)
Chloride: 111 mmol/L (ref 101–111)
Creatinine, Ser: 0.97 mg/dL (ref 0.61–1.24)
GLUCOSE: 89 mg/dL (ref 65–99)
POTASSIUM: 4 mmol/L (ref 3.5–5.1)
SODIUM: 141 mmol/L (ref 135–145)

## 2016-02-22 LAB — URINE CULTURE: Culture: NO GROWTH

## 2016-02-22 LAB — MAGNESIUM: MAGNESIUM: 1.9 mg/dL (ref 1.7–2.4)

## 2016-02-22 LAB — HEPARIN LEVEL (UNFRACTIONATED): Heparin Unfractionated: <0.1 IU/mL — ABNORMAL LOW (ref 0.30–0.70)

## 2016-02-22 MED ORDER — DILTIAZEM HCL 30 MG PO TABS
30.0000 mg | ORAL_TABLET | Freq: Three times a day (TID) | ORAL | Status: DC
  Administered 2016-02-22 – 2016-02-23 (×3): 30 mg via ORAL
  Filled 2016-02-22 (×3): qty 1

## 2016-02-22 MED ORDER — DIGOXIN 0.25 MG/ML IJ SOLN
0.2500 mg | Freq: Once | INTRAMUSCULAR | Status: AC
  Administered 2016-02-22: 0.25 mg via INTRAVENOUS
  Filled 2016-02-22: qty 1

## 2016-02-22 MED ORDER — METOPROLOL TARTRATE 25 MG PO TABS
25.0000 mg | ORAL_TABLET | Freq: Two times a day (BID) | ORAL | Status: DC
  Administered 2016-02-22 – 2016-02-24 (×5): 25 mg via ORAL
  Filled 2016-02-22 (×5): qty 1

## 2016-02-22 MED ORDER — SODIUM CHLORIDE 0.9 % IV SOLN
INTRAVENOUS | Status: AC
  Administered 2016-02-22: 11:00:00 via INTRAVENOUS

## 2016-02-22 NOTE — Progress Notes (Signed)
Occupational Therapy Treatment Patient Details Name: Zachary RavelSamuel D Peterson MRN: [id number] DOB: 12-Jun-1931 Today's Date: 02/22/2016    History of present illness Pt. is an 83 y.o. male who was [facility] with Seizure Disorder, A-Fib with rapid ventricular response, Diarrhea, and urinary frequency.    OT comments  Pt. Is an 83 y.o. Male who [facility] with Seizure Disorder. Pt. Presents with weakness, decreased activity tolerance, and limited functional mobility which hinders his ability to complete ADL and IADL functioning. Pt. Could benefit from skilled OT services for ADL training, A/E training, adaptive utensils/weights, proximal stability for tremor control strategies, functional mobility, and pt. Education about home modification, and DME. Pt. Plans to return home with wife. Pt. Wife to assist with IADL tasks as needed upon discharge.     Follow Up Recommendations  No OT follow up    Equipment Recommendations       Recommendations for Other Services      Precautions / Restrictions Precautions Precautions: Fall Restrictions Weight Bearing Restrictions: No                                             ADL Overall ADL's : Needs assistance/impaired Eating/Feeding: Set up   Grooming: Set up               Lower Body Dressing: Min guard               Functional mobility during ADLs: Min guard        Vision                     Perception     Praxis      Cognition   Behavior During Therapy: WFL for tasks assessed/performed Overall Cognitive Status: Within Functional Limits for tasks assessed                       Extremity/Trunk Assessment  Upper Extremity Assessment Upper Extremity Assessment: Generalized weakness (Tremors)            Exercises     Shoulder Instructions       General Comments      Pertinent Vitals/ Pain       Pain Assessment: No/denies pain  Home Living Family/patient expects  to be discharged to:: Private residence Living Arrangements: Spouse/significant other Available Help at Discharge: Family;Available 24 hours/day Type of Home: House Home Access: Stairs to enter   Entrance Stairs-Rails: Right;Left Home Layout: One level     Bathroom Shower/Tub: Tub/shower unit         Home Equipment: Cane - single point          Prior Functioning/Environment Level of Independence: Independent        Comments: Independent with ADLs, and IADLs, uses a pillbox for medication management.   Frequency  Min 1X/week        Progress Toward Goals  OT Goals(current goals can now be found in the care plan section)     Acute Rehab OT Goals Patient Stated Goal: to return home, regain independence OT Goal Formulation: With patient Potential to Achieve Goals: Good  Plan      Co-evaluation                 End of Session     Activity Tolerance Patient tolerated treatment well  Patient Left in bed;with call bell/phone within reach;with bed alarm set   Nurse Communication          Time: 1610-9604 OT Time Calculation (min): 17 min  Charges: OT General Charges $OT Visit: 1 Procedure OT Evaluation $OT Eval Moderate Complexity: 1 Procedure  Olegario Messier, MS, OTR/L 02/22/2016, 3:07 PM

## 2016-02-22 NOTE — Care Management (Signed)
Patient is declining home health physical therapy.  Feels his deficits "or reason they think I need it" is due to his chronic bulgding disc problems that will be addressed by neurosurgeon when he recovers from this episode of illness.

## 2016-02-22 NOTE — Progress Notes (Signed)
[employer] [facility] [place] [facility]   PATIENT NAME: Zachary Peterson    MR#:  [id number]  DATE OF BIRTH:  [birth year]  SUBJECTIVE:  CHIEF COMPLAINT:   Chief Complaint  Patient presents with  . Seizures   - remains in afib, rate better controlled, on heparin drip - diarrhea yesterday- positive for norovirus, now diarrhea and GI symptoms resolved,  REVIEW OF SYSTEMS:  Review of Systems  Constitutional: Negative for chills and fever.  HENT: Positive for hearing loss. Negative for ear discharge, ear pain and nosebleeds.   Eyes: Negative for blurred vision.  Respiratory: Negative for cough, shortness of breath and wheezing.   Cardiovascular: Negative for chest pain, palpitations and leg swelling.  Gastrointestinal: Negative for abdominal pain, constipation, diarrhea, nausea and vomiting.  Genitourinary: Negative for dysuria.  Musculoskeletal: Positive for back pain. Negative for myalgias.  Neurological: Negative for dizziness, speech change, focal weakness, seizures, weakness and headaches.    DRUG ALLERGIES:   Allergies  Allergen Reactions  . Aspirin Other (See Comments)    CAUSES ULCERS  . Codeine Nausea And Vomiting  . Oxycodone Nausea And Vomiting    SEIZURES    VITALS:  Blood pressure 104/66, pulse 91, temperature 98.5 F (36.9 C), temperature source Oral, resp. rate 19, height 5\' 7"  (1.702 m), weight 65.4 kg (144 lb 1.6 oz), SpO2 96 %.  PHYSICAL EXAMINATION:  Physical Exam  GENERAL:  83 y.o.-year-old patient lying in the bed with no acute distress.  EYES: Pupils equal, round, reactive to light and accommodation. No scleral icterus. Extraocular muscles intact.  HEENT: Head atraumatic, normocephalic. Oropharynx and nasopharynx clear.  NECK:  Supple, no jugular venous distention. No thyroid enlargement, no tenderness.  LUNGS: Normal breath sounds bilaterally, no wheezing, rales,rhonchi or crepitation. No use of accessory muscles of respiration.   Decreased bibasilar breath sounds CARDIOVASCULAR: S1, S2  Normal rate and irregular rhythm. No murmurs, rubs, or gallops.  ABDOMEN: Soft, nontender, nondistended. Bowel sounds present. No organomegaly or mass.  EXTREMITIES: No pedal edema, cyanosis, or clubbing.  NEUROLOGIC: Cranial nerves II through XII are intact. Muscle strength 5/5 in all extremities. Sensation intact. Gait not checked.  Globally weak appearing PSYCHIATRIC: The patient is alert and oriented x 3.  SKIN: No obvious rash, lesion, or ulcer.    LABORATORY PANEL:   CBC  Recent Labs Lab 02/21/16 0340  WBC 7.0  HGB 13.7  HCT 39.1*  PLT 211   ------------------------------------------------------------------------------------------------------------------  Chemistries   Recent Labs Lab 02/22/16 0353  NA 141  K 4.0  CL 111  CO2 25  GLUCOSE 89  BUN 11  CREATININE 0.97  CALCIUM 7.8*  MG 1.9   ------------------------------------------------------------------------------------------------------------------  Cardiac Enzymes  Recent Labs Lab 02/21/16 1047  TROPONINI 0.03*   ------------------------------------------------------------------------------------------------------------------  RADIOLOGY:  No results found.  EKG:   Orders placed or performed during the hospital encounter of 02/20/16  . EKG 12-Lead  . EKG 12-Lead  . EKG 12-Lead  . EKG 12-Lead  . EKG 12-Lead  . EKG 12-Lead  . EKG 12-Lead  . EKG 12-Lead  . EKG 12-Lead  . EKG 12-Lead    ASSESSMENT AND PLAN:    83 year old male with past medial history significant for seizure disorder, GERD with prior history of  peptic ulcers, remote history of atrial fibrillation, COPD, Hypertension admitted after a seizure episode and gastroenteritis symptoms. Noted to be in Afib with rvr  #1 Afib with rvr- remote history, new onset now, likely has paroxysmal Afib -  likely triggered by seizure and gastroenteritis - Appreciate cards consult - off  the amiodarone drip, no need for oral amiodarone as duration of afib not known, and he remains in the rhythm - on low dose metoprolol- adjust dose - on Heparin now for anticoagulation, ECHO normal- d/c heparin today - not a candidate for long term anticoagulation due to bleeding peptic ulcers and seizure diosrder  #2 Elevated troponin- likely demand ischemia from afib and elevated heart rate - ECHO normal with normal EF 55% - on heparin drip  #3 Hypokalemia- replaced, Mag level improved as well  #4 Dysuria- resolved with fluids, UA and cultures negative  #5 Seizure disorder- followed by [facility] neurology, continue phenytoin, also uses valium prn for aura  #6 Low back pain-scheduled to f/u with orthopedic surgery next week MRI done as outpt with significant disc disease - PT consult requested  #7 Acute norovirus gastroenteritis-diarrhea resolving. Continue IV fluid support until later today  #8 DVT prophylaxis- on heparin gtt for now   Anticipate discharge tomorrow  All the records are reviewed and case discussed with Care Management/Social Workerr. Management plans discussed with the patient, family and they are in agreement.  CODE STATUS: Full Code  TOTAL TIME TAKING CARE OF THIS PATIENT: 34 minutes.   POSSIBLE D/C TOMORROW, DEPENDING ON CLINICAL CONDITION.   Enid Baas M.D on 02/22/2016 at 12:38 PM  Between 7am to 6pm - Pager - [phone]  After 6pm go to [email or web address] - password [email or web address]  [facility] [place] [facility]  Office  [phone]  CC: Primary care physician; Ignacia Palma., MD

## 2016-02-22 NOTE — Progress Notes (Signed)
Patient Name: Zachary Peterson Date of Encounter: 02/22/2016  Primary Cardiologist: New to [facility] - consult by End  Hospital Problem List     Principal Problem:   Seizure [facility]) Active Problems:   Atrial fibrillation with RVR (HCC)   Viral gastroenteritis   Hypokalemia   Demand ischemia (HCC)   Essential hypertension     Subjective   Still with diarrhea. Stool studies positive for Norovirus. Rough night with IV coming out. No chest pain or SOB. No palpitations. Remains in Afib rate controlled at rest and with RVR with movement. Echo showed normal EF.   Inpatient Medications    Scheduled Meds: . aspirin EC  81 mg Oral Daily  . atorvastatin  40 mg Oral q1800  . diazepam  5 mg Oral BID  . metoprolol tartrate  12.5 mg Oral BID  . mirtazapine  15 mg Oral QHS  . phenytoin  100 mg Oral TID  . pregabalin  75 mg Oral BID  . sodium chloride flush  3 mL Intravenous Q12H  . tiotropium  18 mcg Inhalation Daily   Continuous Infusions: . 0.9 % NaCl with KCl 40 mEq / L 125 mL/hr (02/22/16 0453)  . heparin 950 Units/hr (02/22/16 0453)   PRN Meds: acetaminophen **OR** acetaminophen, calcium carbonate, HYDROcodone-acetaminophen, ibuprofen, loperamide, ondansetron **OR** ondansetron (ZOFRAN) IV, ondansetron, polyvinyl alcohol   Vital Signs    Vitals:   02/21/16 1532 02/21/16 1819 02/21/16 1942 02/22/16 0403  BP:  (!) 94/56 (!) 98/56 120/77  Pulse: 95 88 92 (!) 121  Resp:   (!) 1 16  Temp:  99.2 F (37.3 C) 98.5 F (36.9 C) 98.1 F (36.7 C)  TempSrc:  Oral Oral Oral  SpO2: 96%  97% 97%  Weight:      Height:        Intake/Output Summary (Last 24 hours) at 02/22/16 0746 Last data filed at 02/21/16 1830  Gross per 24 hour  Intake          1259.25 ml  Output              500 ml  Net           759.25 ml   Filed Weights   02/20/16 1511 02/20/16 2108  Weight: 149 lb (67.6 kg) 144 lb 1.6 oz (65.4 kg)    Physical Exam    GEN: Frail appearing, in no acute distress.  HEENT:  Grossly normal.  Neck: Supple, no JVD, carotid bruits, or masses. Cardiac: Irregularly irregular, no murmurs, rubs, or gallops. No clubbing, cyanosis, edema.  Radials/DP/PT 2+ and equal bilaterally.  Respiratory:  Respirations regular and unlabored, clear to auscultation bilaterally. GI: Soft, nontender, nondistended, BS + x 4. MS: no deformity or atrophy. Skin: warm and dry, no rash. Neuro:  Strength and sensation are intact. Psych: AAOx3.  Normal affect.  Labs    CBC  Recent Labs  02/20/16 1518 02/21/16 0340  WBC 10.7* 7.0  NEUTROABS 10.2*  --   HGB 15.1 13.7  HCT 43.7 39.1*  MCV 93.5 93.0  PLT 243 211   Basic Metabolic Panel  Recent Labs  02/21/16 0340 02/21/16 1047 02/22/16 0353  NA 137  --  141  K 3.2*  --  4.0  CL 105  --  111  CO2 26  --  25  GLUCOSE 102*  --  89  BUN 15  --  11  CREATININE 1.21  --  0.97  CALCIUM 7.6*  --  7.8*  MG  --  1.8 1.9   Liver Function Tests No results for input(s): AST, ALT, ALKPHOS, BILITOT, PROT, ALBUMIN in the last 72 hours. No results for input(s): LIPASE, AMYLASE in the last 72 hours. Cardiac Enzymes  Recent Labs  02/20/16 2306 02/21/16 0340 02/21/16 1047  TROPONINI 0.03* 0.04* 0.03*   BNP Invalid input(s): POCBNP D-Dimer No results for input(s): DDIMER in the last 72 hours. Hemoglobin A1C No results for input(s): HGBA1C in the last 72 hours. Fasting Lipid Panel No results for input(s): CHOL, HDL, LDLCALC, TRIG, CHOLHDL, LDLDIRECT in the last 72 hours. Thyroid Function Tests  Recent Labs  02/20/16 1518  TSH 1.340    Telemetry    Afib, 90's to 120's bpm - Personally Reviewed  ECG    n/a - Personally Reviewed  Radiology    No results found.  Cardiac Studies   Echo 02/21/16: Study Conclusions  - Left ventricle: The cavity size was normal. Wall thickness was   normal. Systolic function was normal. The estimated ejection   fraction was in the range of 55% to 65%. - Mitral valve: There was mild  regurgitation. - Right ventricle: The cavity size was at the upper limits of   normal. Systolic function was normal. - Pulmonary arteries: Pulmonary artery systolic pressure is likely   at least mildly elevated (RVSP = 30 mmHg plus central venous   pressure).  Patient Profile     83 y.o. male with history of seizure disorder with syncope, prior GI bleeding 2/2 ulcer, palpitations with patient and family reported PAF though without prior documented evidence of arrhythmia noted in Epic, HTN, HLD, migraine disorder, COPD 2/2 prior tobacco abuse, and chronic back pain who [facility] s/p seizure and was noted to be in new onset Afib with RVR.  Assessment & Plan    1. New onset Afib with RVR: -Patient and his granddaughter report a prior history of Afib that he has never seen a cardiologist for. There are no prior EKG and rhythm strips in Epic or Care Everywhere to review this -Remains with modest rate control in the low 90's bpm at rest and tachycardic into the 120's with movement -Lopressor 12.5 mg qid with hypotension-->changed to 12.5 mg bid -Increase Lopressor to 25 mg bid for added rate control -Amiodarone held 2/2 uncertain duration in Afib -IV heparin per pharmacy -Titrate rate control to a goal of < 110 bpm -Unlikely a long term, full-dose anticoagulation candidate given prior history of GI with ulcer and seizure disorder complicated by syncope -CHADS2VASc at least 3 (HTN, age x 2) -Potassium repleted -Magnesium and TSH ok -Echo with normal EF -No CXR -Given reported prior episodes of Afib would benefit from outpatient cardiac monitoring   2. Seizure disorder: -Known associated syncope -Consider CT head -[facility]Per IM  3. Hypokalemia: -Likely in the setting of his recent viral gastroenteritis  -Replete to 4.0  4. Elevated troponin: -No symptoms of angina -Likely supply demand ischemia in the setting of seizure, Afib with RVR, and hypokalemia    -This is not a NSTEMI -Echo as above  5. Viral gastroenteritis/Norovirus: -Still with diarrhea -Per IM  6. HTN: -Stable to soft   Signed, Carola FrostRyan Dunn, PA-C [facility] Pager: [phone] 02/22/2016, 7:46 AM   Attending Note Patient seen and examined, agree with detailed note above,  Patient presentation and plan discussed on rounds.   Heart rate continues to run fast, ventricular rate 120 Appears otherwise comfortable on exam  Normal ejection fraction on echocardiogram Tolerating low-dose metoprolol twice a day though some hypotension Still having diarrhea  On clinical exam lungs relatively clear to auscultation, heart sounds rapid irregular, no murmurs appreciated, abdomen soft nontender, no significant lower extremity edema  --- Atrial fibrillation, persistent with rapid rate Recommend we continue low-dose beta blocker twice a day, give digoxin 0.25 mg IV push 1 and consider low-dose 0.125 on subsequent days, Placed order for diltiazem 30 mg every 8 hours with hold parameters If unable to slow his rate, may need to restart amiodarone, could use oral -Last resort would be TEE cardioversion    Greater than 50% was spent in counseling and coordination of care with patient Total encounter time 25 minutes or more   Signed: Dossie Arbour  M.D., Ph.D. [facility]

## 2016-02-22 NOTE — Progress Notes (Signed)
OT Cancellation Note  Patient Details Name: Derrick RavelSamuel D Hino MRN: [id number] DOB: December 09, 1931   Cancelled Treatment:    Reason Eval/Treat Not Completed: Patient at procedure or test/ unavailable (MD with pt. Will attempt at a later time.)  Olegario MessierElaine Jamica Woodyard, MS, OTR/L 02/22/2016, 11:03 AM

## 2016-02-22 NOTE — Progress Notes (Signed)
ANTICOAGULATION CONSULT NOTE - Initial Consult  Pharmacy Consult for Heparin  Indication: chest pain/ACS  Allergies  Allergen Reactions  . Aspirin Other (See Comments)    CAUSES ULCERS  . Codeine Nausea And Vomiting  . Oxycodone Nausea And Vomiting    SEIZURES    Patient Measurements: Height: 5\' 7"  (170.2 cm) Weight: 144 lb 1.6 oz (65.4 kg) IBW/kg (Calculated) : 66.1 Heparin Dosing Weight: 67.6 kg   Vital Signs: Temp: 98.1 F (36.7 C) (02/14 0403) Temp Source: Oral (02/14 0403) BP: 120/77 (02/14 0403) Pulse Rate: 121 (02/14 0403)  Labs:  Recent Labs  02/20/16 1518 02/20/16 2013 02/20/16 2306  02/21/16 0340 02/21/16 1047 02/21/16 1314 02/21/16 2123 02/22/16 0353  HGB 15.1  --   --   --  13.7  --   --   --   --   HCT 43.7  --   --   --  39.1*  --   --   --   --   PLT 243  --   --   --  211  --   --   --   --   APTT  --  155*  --   --   --   --   --   --   --   LABPROT  --  15.1  --   --   --   --   --   --   --   INR  --  1.18  --   --   --   --   --   --   --   HEPARINUNFRC  --   --   --   < > 0.24*  --  0.34 0.36 <0.10*  CREATININE 1.47*  --   --   --  1.21  --   --   --  0.97  TROPONINI 0.70*  --  0.03*  --  0.04* 0.03*  --   --   --   < > = values in this interval not displayed.  Estimated Creatinine Clearance: 54.3 mL/min (by C-G formula based on SCr of 0.97 mg/dL).   Medical History: Past Medical History:  Diagnosis Date  . Back pain   . COPD (chronic obstructive pulmonary disease) (HCC)   . Depression   . Hearing loss   . Hyperlipemia   . Hypertension   . Migraine   . Palpitations   . Seizure (HCC)   . Syncope    a. related to seizure disorder    Medications:  Prescriptions Prior to Admission  Medication Sig Dispense Refill Last Dose  . amLODipine (NORVASC) 10 MG tablet Take 10 mg by mouth daily.   02/20/2016 at Unknown time  . diazepam (VALIUM) 5 MG tablet Take 5 mg by mouth 2 (two) times daily. ANXIETY   prn at prn  . DILANTIN 100 MG ER  capsule Take 100 mg by mouth 3 (three) times daily.   02/19/2016 at Unknown time  . ezetimibe-simvastatin (VYTORIN) 10-80 MG per tablet Take 1 tablet by mouth every evening.    02/19/2016 at Unknown time  . fluticasone (FLONASE) 50 MCG/ACT nasal spray Place 1 spray into both nostrils daily.   02/20/2016 at Unknown time  . hydrochlorothiazide (HYDRODIURIL) 25 MG tablet Take 25 mg by mouth daily.   02/20/2016 at Unknown time  . hydroxypropyl methylcellulose / hypromellose (ISOPTO TEARS / GONIOVISC) 2.5 % ophthalmic solution Place 1 drop into both eyes 3 (three) times daily as needed  for dry eyes.   prn at prn  . ibuprofen (ADVIL,MOTRIN) 200 MG tablet Take 200 mg by mouth every 6 (six) hours as needed for headache or moderate pain.    prn at prn  . mirtazapine (REMERON) 15 MG tablet Take 15 mg by mouth at bedtime.   02/19/2016 at Unknown time  . ondansetron (ZOFRAN ODT) 8 MG disintegrating tablet Take 1 tablet (8 mg total) by mouth every 8 (eight) hours as needed for nausea or vomiting. 15 tablet 0 prn at prn  . pregabalin (LYRICA) 75 MG capsule Take 75 mg by mouth 2 (two) times daily.   02/20/2016 at Unknown time  . tiotropium (SPIRIVA) 18 MCG inhalation capsule Place 18 mcg into inhaler and inhale daily.   02/20/2016 at Unknown time  . HYDROcodone-acetaminophen (NORCO) 5-325 MG per tablet Take 1 tablet by mouth every 6 (six) hours as needed for moderate pain. 15 tablet 0 prn at prn    Assessment: Pharmacy consulted to dose heparin in this 83 year old male with ACS.  CrCl = 43.5 ml/min  Goal of Therapy:  Heparin level 0.3-0.7 units/ml Monitor platelets by anticoagulation protocol: Yes   Plan:  2/13 0340 HL subtherapeutic x 1. 1000 units IV x 1 bolus and increase rate to 950 units/hr. Recheck HL in 8 hours.  2/13 Heparin level @ 0.34. Will continue heparin gtt and will recheck HL @ 21:00.   2/13: HL @ 21:23 = 0.36 Will continue this pt on current rate of 950 units/hr and recheck HL on 2/14 with AM  labs.   2/14 0353 HL subtherapeutic. According to documentation in [facility], IV access was lost from approximately 0250 to 0453. Heparin not been infusing for at least an hour when level was drawn. Drip was restarted at 0453, will not increase or bolus at this time. Recheck HL 8 hours after restarting infusion (13:00 on 02/22/16).   Carola Frost, Pharm.D., BCPS Clinical Pharmacist 02/22/2016,6:34 AM

## 2016-02-23 ENCOUNTER — Inpatient Hospital Stay: Payer: Medicare HMO

## 2016-02-23 DIAGNOSIS — R262 Difficulty in walking, not elsewhere classified: Secondary | ICD-10-CM

## 2016-02-23 DIAGNOSIS — M6281 Muscle weakness (generalized): Secondary | ICD-10-CM

## 2016-02-23 DIAGNOSIS — A09 Infectious gastroenteritis and colitis, unspecified: Secondary | ICD-10-CM

## 2016-02-23 LAB — CBC
HCT: 33.1 % — ABNORMAL LOW (ref 40.0–52.0)
HEMOGLOBIN: 11.4 g/dL — AB (ref 13.0–18.0)
MCH: 32.5 pg (ref 26.0–34.0)
MCHC: 34.5 g/dL (ref 32.0–36.0)
MCV: 94 fL (ref 80.0–100.0)
PLATELETS: 197 10*3/uL (ref 150–440)
RBC: 3.52 MIL/uL — AB (ref 4.40–5.90)
RDW: 14.7 % — ABNORMAL HIGH (ref 11.5–14.5)
WBC: 5.2 10*3/uL (ref 3.8–10.6)

## 2016-02-23 LAB — BASIC METABOLIC PANEL
ANION GAP: 4 — AB (ref 5–15)
BUN: 10 mg/dL (ref 6–20)
CHLORIDE: 111 mmol/L (ref 101–111)
CO2: 25 mmol/L (ref 22–32)
Calcium: 7.3 mg/dL — ABNORMAL LOW (ref 8.9–10.3)
Creatinine, Ser: 0.94 mg/dL (ref 0.61–1.24)
GFR calc Af Amer: >60 mL/min (ref >60)
Glucose, Bld: 93 mg/dL (ref 65–99)
POTASSIUM: 4 mmol/L (ref 3.5–5.1)
SODIUM: 140 mmol/L (ref 135–145)

## 2016-02-23 MED ORDER — DILTIAZEM HCL ER COATED BEADS 120 MG PO CP24
120.0000 mg | ORAL_CAPSULE | Freq: Every day | ORAL | Status: DC
  Administered 2016-02-23 – 2016-02-24 (×2): 120 mg via ORAL
  Filled 2016-02-23 (×2): qty 1

## 2016-02-23 MED ORDER — ENOXAPARIN SODIUM 40 MG/0.4ML ~~LOC~~ SOLN
40.0000 mg | SUBCUTANEOUS | Status: DC
Start: 2016-02-23 — End: 2016-02-24
  Administered 2016-02-23: 40 mg via SUBCUTANEOUS
  Filled 2016-02-23: qty 0.4

## 2016-02-23 NOTE — Progress Notes (Signed)
Patient and family are frustrated that he is not being discharged today.  Explained that medications are being changed with the intent of getting him back in SR prior to discharge.  He converts to SR, then later returns to A-fib.

## 2016-02-23 NOTE — Care Management (Signed)
patient concerns about continued stay and his hospital bill.  Discussed the concerns regarding heart rate and that he meets medical necessity for continued stay.  Wife says out of hearing of patient that she thinks patient has some dementia.  he gets argumentative and a little confused at home sometimes.  Discussed home health RN and teleheath with patient and his wife.  At present, they are in agreement.  [facility] is not able to staff the [place] area at present. [facility] has telehealth- checking with that agency

## 2016-02-23 NOTE — Progress Notes (Signed)
[employer] [facility] [facility] [place] [facility]   PATIENT NAME: Zachary IvanoffSamuel Moncrieffe    MR#:  [id number]  DATE OF BIRTH:  11/14/1931  SUBJECTIVE:  CHIEF COMPLAINT:   Chief Complaint  Patient presents with  . Seizures   - remains in afib, rate better controlled, off heparin drip, - diarrhea improving, generalized weakness. Wife and daughter bedside  REVIEW OF SYSTEMS:  Review of Systems  Constitutional: Negative for chills and fever.  HENT: Positive for hearing loss. Negative for ear discharge, ear pain and nosebleeds.   Eyes: Negative for blurred vision.  Respiratory: Negative for cough, shortness of breath and wheezing.   Cardiovascular: Negative for chest pain, palpitations and leg swelling.  Gastrointestinal: Negative for abdominal pain, constipation, diarrhea, nausea and vomiting.  Genitourinary: Negative for dysuria.  Musculoskeletal: Positive for back pain. Negative for myalgias.  Neurological: Negative for dizziness, speech change, focal weakness, seizures, weakness and headaches.    DRUG ALLERGIES:   Allergies  Allergen Reactions  . Aspirin Other (See Comments)    CAUSES ULCERS  . Codeine Nausea And Vomiting  . Oxycodone Nausea And Vomiting    SEIZURES    VITALS:  Blood pressure (!) 98/50, pulse 95, temperature 99.4 F (37.4 C), temperature source Oral, resp. rate 18, height 5\' 7"  (1.702 m), weight 65.4 kg (144 lb 1.6 oz), SpO2 95 %.  PHYSICAL EXAMINATION:  Physical Exam  GENERAL:  83 y.o.-year-old patient lying in the bed with no acute distress.  EYES: Pupils equal, round, reactive to light and accommodation. No scleral icterus. Extraocular muscles intact.  HEENT: Head atraumatic, normocephalic. Oropharynx and nasopharynx clear.  NECK:  Supple, no jugular venous distention. No thyroid enlargement, no tenderness.  LUNGS: Normal breath sounds bilaterally, no wheezing, rales,rhonchi or crepitation. No use of accessory muscles of respiration.  Decreased  bibasilar breath sounds CARDIOVASCULAR: Irregularly irregular  Normal rate and irregular rhythm. No murmurs, rubs, or gallops.  ABDOMEN: Soft, nontender, nondistended. Bowel sounds present. No organomegaly or mass.  EXTREMITIES: No pedal edema, cyanosis, or clubbing.  NEUROLOGIC: Cranial nerves II through XII are intact. Muscle strength 5/5 in all extremities. Sensation intact. Gait not checked.  Globally weak appearing PSYCHIATRIC: The patient is alert and oriented x 3.  SKIN: No obvious rash, lesion, or ulcer.    LABORATORY PANEL:   CBC  Recent Labs Lab 02/23/16 0328  WBC 5.2  HGB 11.4*  HCT 33.1*  PLT 197   ------------------------------------------------------------------------------------------------------------------  Chemistries   Recent Labs Lab 02/22/16 0353 02/23/16 0328  NA 141 140  K 4.0 4.0  CL 111 111  CO2 25 25  GLUCOSE 89 93  BUN 11 10  CREATININE 0.97 0.94  CALCIUM 7.8* 7.3*  MG 1.9  --    ------------------------------------------------------------------------------------------------------------------  Cardiac Enzymes  Recent Labs Lab 02/21/16 1047  TROPONINI 0.03*   ------------------------------------------------------------------------------------------------------------------  RADIOLOGY:  No results found.  EKG:   Orders placed or performed during the hospital encounter of 02/20/16  . EKG 12-Lead  . EKG 12-Lead  . EKG 12-Lead  . EKG 12-Lead  . EKG 12-Lead  . EKG 12-Lead  . EKG 12-Lead  . EKG 12-Lead  . EKG 12-Lead  . EKG 12-Lead    ASSESSMENT AND PLAN:    83 year old male with past medial history significant for seizure disorder, GERD with prior history of  peptic ulcers, remote history of atrial fibrillation, COPD, Hypertension admitted after a seizure episode and gastroenteritis symptoms. Noted to be in Afib with rvr  #1 Afib  with rvr- remote history, new onset now, likely has paroxysmal Afib - likely triggered by  seizure and gastroenteritis - Appreciate cards consult - off the amiodarone drip, no need for oral amiodarone as duration of afib not known, and he remains in the rhythm -Change to short-acting to long-acting Cardizem 120 milligrams once daily -Metoprolol 25 mg by mouth twice a day - on low dose metoprolol- adjust dose - on Heparin now for anticoagulation, ECHO normal- d/c heparin today - not a candidate for long term anticoagulation due to bleeding peptic ulcers and seizure diosrder  #2 Elevated troponin- likely demand ischemia from afib and elevated heart rate - ECHO normal with normal EF 55% - off heparin drip secondary to peptic ulcer disease and history of seizures cardiology might consider starting him on anticoagulants in the near future  #3 Hypokalemia- replaced, Mag level improved as well  #4 Dysuria- resolved with fluids, UA and cultures negative  #5 Seizure disorder- followed by [facility] neurology, continue phenytoin, also uses valium prn for aura Outpatient follow-up with neurology at [facility] CT head ordered as patient syncopized probably from seizures  #6 Low back pain-scheduled to f/u with orthopedic surgery next week MRI done as outpt with significant disc disease - PTrecommending home health PT and a 5 inch wheel  rolling walker  #7 Acute norovirus gastroenteritis-diarrhea resolving. Discontinue IV fluids as patient is tolerating diet   #8 DVT prophylaxis- started the patient on Lovenox. Discontinued heparin drip  Anticipate discharge tomorrow  All the records are reviewed and case discussed with Care Management/Social Workerr. Management plans discussed with the patient, family and they are in agreement.  CODE STATUS: Full Code  TOTAL TIME TAKING CARE OF THIS PATIENT: 34 minutes.   POSSIBLE D/C TOMORROW, DEPENDING ON CLINICAL CONDITION.   Ramonita Lab M.D on 02/23/2016 at 1:32 PM  Between 7am to 6pm - Pager - [phone]  After 6pm go to [email or web address] - password  [employer]  [facility] [place] [facility]  Office  [phone]  CC: Primary care physician; Ignacia Palma., MD

## 2016-02-23 NOTE — Progress Notes (Signed)
A&O. Up with assist. Having diarrhea, Positive for norovirus.

## 2016-02-23 NOTE — Progress Notes (Addendum)
Physical Therapy Treatment Patient Details Name: Derrick RavelSamuel D Flanagan MRN: [id number] DOB: 06-03-1931 Today's Date: 02/23/2016    History of Present Illness Pt. is an 83 y.o. male who was admitted to [facility] with Seizure Disorder, A-Fib with rapid ventricular response, Diarrhea, and urinary frequency.     PT Comments    Pt initially declining therapy. Stated and daughter confirmed that he had recently walked around room with her and just returned to bed.  Agreed with encouragement to participate in mobility skills with therapy.  Bed mobility without assist.  Pt was able to stand and ambulate 80' in room with walker and occasional LOB's that required min guard/min assist at times to correct.    Pt voiced that he did not plan to use a walker upon discharge and daughter stated they live in an older home and that she felt it would get "caught up" and make it more difficult for him to get around.  Discussed general safety and increased risk for falls.  Daughter stated they walk occasionally during the day in his room together and that she takes bed alarm off when she is here.  Reviewed safety with pt and daughter again before leaving the room.    Follow Up Recommendations  Home health PT     Equipment Recommendations  Rolling walker with 5" wheels    Recommendations for Other Services       Precautions / Restrictions Precautions Precautions: Fall Restrictions Weight Bearing Restrictions: No    Mobility  Bed Mobility Overal bed mobility: Independent                Transfers Overall transfer level: Needs assistance Equipment used: Rolling walker (2 wheeled) Transfers: Sit to/from Stand Sit to Stand: Min guard            Ambulation/Gait Ambulation/Gait assistance: Min guard Ambulation Distance (Feet): 80 Feet Assistive device: Rolling walker (2 wheeled) Gait Pattern/deviations: Decreased step length - right;Decreased step length - left;Trunk flexed     General Gait  Details: in room with multiple turns and navigating close areas.    Stairs            Wheelchair Mobility    Modified Rankin (Stroke Patients Only)       Balance Overall balance assessment: Needs assistance Sitting-balance support: No upper extremity supported Sitting balance-Leahy Scale: Good     Standing balance support: Bilateral upper extremity supported Standing balance-Leahy Scale: Good                      Cognition Arousal/Alertness: Awake/alert Behavior During Therapy: WFL for tasks assessed/performed Overall Cognitive Status: Within Functional Limits for tasks assessed                 General Comments: initially refuses but agreed with daughters encouragement.      Exercises      General Comments        Pertinent Vitals/Pain Pain Assessment: No/denies pain    Home Living                      Prior Function            PT Goals (current goals can now be found in the care plan section) Progress towards PT goals: Progressing toward goals    Frequency    Min 2X/week      PT Plan Current plan remains appropriate    Co-evaluation  End of Session Equipment Utilized During Treatment: Gait belt Activity Tolerance: Patient tolerated treatment well Patient left: in bed;with call bell/phone within reach;with family/visitor present     Time: 11:29-11:44 PT Charges  15 minutes   Charges:  Gait 0-23 minutes                    G Codes:      Danielle Dess 2016/03/04, 11:50 AM

## 2016-02-23 NOTE — Progress Notes (Signed)
Patient Name: Zachary Peterson Date of Encounter: 02/23/2016  Primary Cardiologist: New to [facility] - consult by End  Hospital Problem List     Principal Problem:   Seizure [facility]) Active Problems:   Atrial fibrillation with RVR (HCC)   Viral gastroenteritis   Hypokalemia   Demand ischemia of myocardium (HCC)   Essential hypertension   Diarrhea     Subjective   Still with diarrhea overnight. No chest pain, SOB, or palpitations. Disgruntled this morning over missed sleep. Heart rate better controlled in the 80's to 90's bpm in Afib.   Inpatient Medications    Scheduled Meds: . aspirin EC  81 mg Oral Daily  . atorvastatin  40 mg Oral q1800  . diazepam  5 mg Oral BID  . diltiazem  30 mg Oral Q8H  . metoprolol tartrate  25 mg Oral BID  . mirtazapine  15 mg Oral QHS  . phenytoin  100 mg Oral TID  . pregabalin  75 mg Oral BID  . sodium chloride flush  3 mL Intravenous Q12H  . tiotropium  18 mcg Inhalation Daily   Continuous Infusions:  PRN Meds: acetaminophen **OR** acetaminophen, calcium carbonate, HYDROcodone-acetaminophen, ibuprofen, loperamide, ondansetron **OR** ondansetron (ZOFRAN) IV, ondansetron, polyvinyl alcohol   Vital Signs    Vitals:   02/22/16 0403 02/22/16 1109 02/22/16 2006 02/23/16 0340  BP: 120/77 104/66 (!) 130/56 (!) 101/52  Pulse: (!) 121 91 93 95  Resp: 16 19 16 18   Temp: 98.1 F (36.7 C) 98.5 F (36.9 C) 98.3 F (36.8 C) 98.4 F (36.9 C)  TempSrc: Oral Oral Oral Oral  SpO2: 97% 96% 98% 94%  Weight:      Height:        Intake/Output Summary (Last 24 hours) at 02/23/16 0745 Last data filed at 02/23/16 0320  Gross per 24 hour  Intake                0 ml  Output              200 ml  Net             -200 ml   Filed Weights   02/20/16 1511 02/20/16 2108  Weight: 149 lb (67.6 kg) 144 lb 1.6 oz (65.4 kg)    Physical Exam    GEN: Frail appearing, in no acute distress.  HEENT: Grossly normal.  Neck: Supple, no JVD, carotid bruits, or  masses. Cardiac: Irregularly irregular, no murmurs, rubs, or gallops. No clubbing, cyanosis, edema.  Radials/DP/PT 2+ and equal bilaterally.  Respiratory:  Respirations regular and unlabored, clear to auscultation bilaterally. GI: Soft, nontender, nondistended, BS + x 4. MS: no deformity or atrophy. Skin: warm and dry, no rash. Neuro:  Strength and sensation are intact. Psych: AAOx3.  Normal affect.  Labs    CBC  Recent Labs  02/20/16 1518 02/21/16 0340 02/23/16 0328  WBC 10.7* 7.0 5.2  NEUTROABS 10.2*  --   --   HGB 15.1 13.7 11.4*  HCT 43.7 39.1* 33.1*  MCV 93.5 93.0 94.0  PLT 243 211 197   Basic Metabolic Panel  Recent Labs  02/21/16 1047 02/22/16 0353 02/23/16 0328  NA  --  141 140  K  --  4.0 4.0  CL  --  111 111  CO2  --  25 25  GLUCOSE  --  89 93  BUN  --  11 10  CREATININE  --  0.97 0.94  CALCIUM  --  7.8* 7.3*  MG 1.8 1.9  --    Liver Function Tests No results for input(s): AST, ALT, ALKPHOS, BILITOT, PROT, ALBUMIN in the last 72 hours. No results for input(s): LIPASE, AMYLASE in the last 72 hours. Cardiac Enzymes  Recent Labs  02/20/16 2306 02/21/16 0340 02/21/16 1047  TROPONINI 0.03* 0.04* 0.03*   BNP Invalid input(s): POCBNP D-Dimer No results for input(s): DDIMER in the last 72 hours. Hemoglobin A1C No results for input(s): HGBA1C in the last 72 hours. Fasting Lipid Panel No results for input(s): CHOL, HDL, LDLCALC, TRIG, CHOLHDL, LDLDIRECT in the last 72 hours. Thyroid Function Tests  Recent Labs  02/20/16 1518  TSH 1.340    Telemetry    Rate controlled Afib, 80's to 90's bpm - Personally Reviewed  ECG    n/a - Personally Reviewed  Radiology    No results found.  Cardiac Studies   Echo 02/21/16: Study Conclusions  - Left ventricle: The cavity size was normal. Wall thickness was normal. Systolic function was normal. The estimated ejection fraction was in the range of 55% to 65%. - Mitral valve: There was mild  regurgitation. - Right ventricle: The cavity size was at the upper limits of normal. Systolic function was normal. - Pulmonary arteries: Pulmonary artery systolic pressure is likely at least mildly elevated (RVSP = 30 mmHg plus central venous pressure).  Patient Profile     83 y.o. male with history of seizure disorder with syncope, prior GI bleeding 2/2 ulcer, palpitations with patient and family reported PAF though without prior documented evidence of arrhythmia noted in Epic, HTN, HLD, migraine disorder, COPD 2/2 prior tobacco abuse, and chronic back pain who [facility] s/p seizure and was noted to be in new onset Afib with RVR.  Assessment & Plan    1. New onset Afib with RVR: -Patient and his granddaughter report a prior history of Afib that he has never seen a cardiologist for. There are no prior EKG and rhythm strips in Epic or Care Everywhere to review this -Remains in Afib with improved heart rate control in the 80's to 90's bpm -Consolidate short-acting diltiazem to long-acting Cardizem  -Continue Lopressor 25 mg bid -Amiodarone held 2/2 uncertain duration in Afib -IV heparin discontinued 2/14 -Unlikely a long term, full-dose anticoagulation candidate given prior history of GI with ulcer and seizure disorder complicated by syncope -CHADS2VASc at least 3 (HTN, age x 2) -Potassium repleted -Magnesium and TSH ok -Echo with normal EF -No CXR -Given reported prior episodes of Afib would benefit from outpatient cardiac monitoring   2. Seizure disorder: -Known associated syncope -Consider CT head -[facility] -Per IM  3. Hypokalemia: -Repleted  4. Elevated troponin: -No symptoms of angina -Likely supply demand ischemia in the setting of seizure, Afib with RVR, and hypokalemia  -This is not a NSTEMI -Echo as above  5. Viral gastroenteritis/Norovirus: -Still with diarrhea overnight -Per IM  6. HTN: -Stable to soft   Signed, Carola FrostRyan Dunn,  PA-C [facility] Pager: [phone] 02/23/2016, 7:45 AM   Attending Note Patient seen and examined, agree with detailed note above,  Patient presentation and plan discussed on rounds.   Patient reports continued diarrhea Having cough, weakness, has not been out of bed Medication changes this morning by our team Changed to diltiazem long-acting medication, continued on metoprolol Improved rate control on this regimen so far Discussed anticoagulation options with family, They report with his recent seizure he hit his head when he fell  On physical exam heart sounds regular no murmurs appreciated, lungs with mildly decreased breath sounds throughout, scant rales, abdomen soft nontender, no significant lower extremity edema  Lab work reviewed showing stable renal function, hematocrit 33 C. difficile negative  ----Atrial fibrillation, persistent Recent medication changes would long-acting diltiazem and metoprolol Not a good candidate for anticoagulation, risk and benefit of anticoagulation discussed with the family, they are in agreement to hold anticoagulation at this time given unsteady gait, frequent falls, recent seizure with trauma to his head We'll need to follow-up in clinic to determine if his stability improves and becomes a candidate for anticoagulation- -Currently he has refused to work with physical therapy, has been in bedEntire time Family wondering if he can leave today  We have recommended he stay overnight  Greater than 50% was spent in counseling and coordination of care with patient Total encounter time 25 minutes or more   Signed: Dossie Arbour  M.D., Ph.D. [facility]

## 2016-02-23 NOTE — Plan of Care (Signed)
Problem: Activity: Goal: Ability to tolerate increased activity will improve Outcome: Progressing Get up to the bathroom with a walker. Is stable and has no problem getting in and out of bed.

## 2016-02-24 ENCOUNTER — Encounter: Payer: Self-pay | Admitting: Nurse Practitioner

## 2016-02-24 LAB — CBC
HCT: 38.2 % — ABNORMAL LOW (ref 40.0–52.0)
HEMOGLOBIN: 13.1 g/dL (ref 13.0–18.0)
MCH: 32.2 pg (ref 26.0–34.0)
MCHC: 34.3 g/dL (ref 32.0–36.0)
MCV: 94.1 fL (ref 80.0–100.0)
PLATELETS: 205 10*3/uL (ref 150–440)
RBC: 4.06 MIL/uL — ABNORMAL LOW (ref 4.40–5.90)
RDW: 14.4 % (ref 11.5–14.5)
WBC: 6.6 10*3/uL (ref 3.8–10.6)

## 2016-02-24 MED ORDER — PANTOPRAZOLE SODIUM 20 MG PO TBEC: 20.0000 mg | DELAYED_RELEASE_TABLET | Freq: Every day | ORAL | Status: DC

## 2016-02-24 MED ORDER — METOPROLOL TARTRATE 25 MG PO TABS
25.0000 mg | ORAL_TABLET | Freq: Two times a day (BID) | ORAL | 0 refills | Status: DC
Start: 2016-02-24 — End: 2016-03-02

## 2016-02-24 MED ORDER — CALCIUM CARBONATE ANTACID 500 MG PO CHEW: 1.0000 | CHEWABLE_TABLET | Freq: Three times a day (TID) | ORAL | Status: AC | PRN

## 2016-02-24 MED ORDER — PANTOPRAZOLE SODIUM 40 MG PO TBEC: 40.0000 mg | DELAYED_RELEASE_TABLET | Freq: Every day | ORAL | Status: DC

## 2016-02-24 MED ORDER — DILTIAZEM HCL ER COATED BEADS 120 MG PO CP24
120.0000 mg | ORAL_CAPSULE | Freq: Every day | ORAL | 0 refills | Status: DC
Start: 2016-02-25 — End: 2016-03-02

## 2016-02-24 MED ORDER — ASPIRIN 81 MG PO TBEC: 81.0000 mg | DELAYED_RELEASE_TABLET | Freq: Every day | ORAL | Status: DC

## 2016-02-24 NOTE — Progress Notes (Signed)
Progress Note  Patient Name: Zachary RavelSamuel D Teagle Date of Encounter: 02/24/2016  Primary Cardiologist: Wallace Cullens. End, MD   Subjective   Feels well. Eager to go home.  No c/p, palpitations, dyspnea.  Inpatient Medications    Scheduled Meds: . aspirin EC  81 mg Oral Daily  . atorvastatin  40 mg Oral q1800  . diazepam  5 mg Oral BID  . diltiazem  120 mg Oral Daily  . enoxaparin (LOVENOX) injection  40 mg Subcutaneous Q24H  . metoprolol tartrate  25 mg Oral BID  . mirtazapine  15 mg Oral QHS  . phenytoin  100 mg Oral TID  . pregabalin  75 mg Oral BID  . sodium chloride flush  3 mL Intravenous Q12H  . tiotropium  18 mcg Inhalation Daily   Continuous Infusions:  PRN Meds: acetaminophen **OR** acetaminophen, calcium carbonate, HYDROcodone-acetaminophen, ibuprofen, loperamide, ondansetron **OR** ondansetron (ZOFRAN) IV, ondansetron, polyvinyl alcohol   Vital Signs    Vitals:   02/23/16 1320 02/23/16 2102 02/24/16 0458 02/24/16 0745  BP: (!) 98/50 (!) 104/54 115/60 (!) 115/58  Pulse: 95 97 95 (!) 105  Resp: 18 16 14 16   Temp: 99.4 F (37.4 C) 98.4 F (36.9 C) 98.1 F (36.7 C) 98 F (36.7 C)  TempSrc: Oral Oral  Oral  SpO2: 95% 94% 95% 93%  Weight:      Height:        Intake/Output Summary (Last 24 hours) at 02/24/16 1008 Last data filed at 02/24/16 0504  Gross per 24 hour  Intake                0 ml  Output              850 ml  Net             -850 ml   Filed Weights   02/20/16 1511 02/20/16 2108  Weight: 149 lb (67.6 kg) 144 lb 1.6 oz (65.4 kg)    Physical Exam   GEN: Well nourished, well developed, in no acute distress.  HEENT: Grossly normal.  Neck: Supple, no JVD, carotid bruits, or masses. Cardiac: IR, IR, no murmurs, rubs, or gallops. No clubbing, cyanosis, edema.  Radials/DP/PT 2+ and equal bilaterally.  Respiratory:  Respirations regular and unlabored, clear to auscultation bilaterally. GI: Soft, nontender, nondistended, BS + x 4. MS: no deformity or  atrophy. Skin: warm and dry, no rash. Neuro:  Strength and sensation are intact. Psych: AAOx3.  Normal affect.  Labs    Chemistry Recent Labs Lab 02/21/16 0340 02/22/16 0353 02/23/16 0328  NA 137 141 140  K 3.2* 4.0 4.0  CL 105 111 111  CO2 26 25 25   GLUCOSE 102* 89 93  BUN 15 11 10   CREATININE 1.21 0.97 0.94  CALCIUM 7.6* 7.8* 7.3*  GFRNONAA 54* >60 >60  GFRAA >60 >60 >60  ANIONGAP 6 5 4*     Hematology Recent Labs Lab 02/21/16 0340 02/23/16 0328 02/24/16 0452  WBC 7.0 5.2 6.6  RBC 4.20* 3.52* 4.06*  HGB 13.7 11.4* 13.1  HCT 39.1* 33.1* 38.2*  MCV 93.0 94.0 94.1  MCH 32.5 32.5 32.2  MCHC 35.0 34.5 34.3  RDW 14.2 14.7* 14.4  PLT 211 197 205    Cardiac Enzymes Recent Labs Lab 02/20/16 1518 02/20/16 2306 02/21/16 0340 02/21/16 1047  TROPONINI 0.70* 0.03* 0.04* 0.03*     Radiology    Ct Head Wo Contrast  Result Date: 02/23/2016 CLINICAL DATA:  Seizure.  Syncope  EXAM: CT HEAD WITHOUT CONTRAST TECHNIQUE: Contiguous axial images were obtained from the base of the skull through the vertex without intravenous contrast. COMPARISON:  May 17, 2011 FINDINGS: Brain: Moderate diffuse atrophy is stable. There is no intracranial mass, hemorrhage, extra-axial fluid collection, or midline shift. There is patchy small vessel disease in the centra semiovale bilaterally, stable. No new gray-white compartment lesions are evident. No acute infarct present. Vascular: No hyperdense vessel. There is calcification in the carotid siphon regions bilaterally. Skull: The bony calvarium appears intact. A small soft tissue opacity is noted over the right parietal bone measuring approximately 6 mm, stable. Sinuses/Orbits: There is mucosal thickening in several ethmoid air cells bilaterally. Visualized paranasal sinuses elsewhere are clear. There is mild rightward deviation of the nasal septum. Orbits appear symmetric bilaterally. Other: Visualized mastoids are clear. IMPRESSION: Atrophy with  periventricular small vessel disease, stable. No mass, hemorrhage, or extra-axial fluid collection. No acute infarct. Foci of arterial vascular calcification noted. Mild ethmoid sinus disease present. Deviated nasal septum. Electronically Signed   By: Bretta Bang III M.D.   On: 02/23/2016 14:11    Telemetry    Afib, 80's to 90's - Personally Reviewed  Cardiac Studies   2D Echocardiogram 2.13.2018  Study Conclusions   - Left ventricle: The cavity size was normal. Wall thickness was   normal. Systolic function was normal. The estimated ejection   fraction was in the range of 55% to 65%. - Mitral valve: There was mild regurgitation. - Right ventricle: The cavity size was at the upper limits of   normal. Systolic function was normal. - Pulmonary arteries: Pulmonary artery systolic pressure is likely   at least mildly elevated (RVSP = 30 mmHg plus central venous   pressure).   Patient Profile     83 y.o. 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  Assessment & Plan    1.  Afib RVR:  Pt presented 2/12 following Sz and was noted to be AF RVR.  ? H/o PAF per family - never seen by cards, no prior ECG's available.  CHA2DS2VASc = 3.  Echo w/ nl EF.  Due to h/o Sz d/o with falls and recent fall with minor head trauma prior to admission, GIB and PUD, he is not felt to be an ideal OAC candidate.  In that setting, we will cont rate control, as TEE/DCCV would require at least 4 wks of OAC.  HRs remain 80's to 90's and he is asymptomatic.  Cont  blocker and dilt.  BP soft, thus can't titrate further.  Plan outpt f/u and future consideration of rhythm mgmt and OAC.   Alternative might include Watchman, though again, he would need to be able to tolerate 45 days of warfarin followed by 6 mos of asa/plavix.  2.  Sz D/O:  No recurrence.  Per IM.  Followed by neuro @ [facility].  3.  Elevated troponin:  No chest pain.  Echo w/ nl EF.  Likely demand ischemia in the setting of Sz/Afib RVR. Cont conservative Rx.  Asa, statin,  blocker.  Could consider outpt stress testing once recoverred from GI illness.  4.  HTN:  BP relatively soft on  blocker and dilt.  5.  Viral gastroenteritis/Norovirus:  No diarrhea overnight.  Per IM.  6.  Hypokalemia:  K nl this AM.  Signed, Nicolasa Ducking, NP  02/24/2016, 10:08 AM      Attending Note Patient seen and examined, agree with detailed note above,  Patient presentation and plan discussed on rounds.   Patient reports no diarrhea in 2 days Ambulating with his walker Scheduled to receive physical therapy at home Tolerating long-acting diltiazem, metoprolol, adequate rate control Atrial fibrillation with rate in the 80s Systolic pressure 115 over 60s  On clinical exam no JVD, mildly decreased breath sounds throughout, poor inspiratory effort, heart rate irregularly irregular, adequate rate, no murmurs appreciated, abdomen soft nontender, no significant lower extremity edema  --- Atrial fibrillation, persistent Not a good candidate at this time for anticoagulation given recent seizure, head trauma,  unsteady gait, history of GI bleeding We'll need to follow him closely, readdress this in outpatient clinic Would continue diltiazem and metoprolol for rate control  Greater than 50% was spent in counseling and coordination of care with patient Total encounter time 25 minutes or more   Signed: Dossie Arbour  M.D., Ph.D. [facility]

## 2016-02-24 NOTE — Progress Notes (Signed)
Occupational Therapy Treatment Patient Details Name: Derrick RavelSamuel D Worrell MRN: [id number] DOB: [birth year] Today's Date: 02/24/2016    History of present illness Pt. is an 83 y.o. male who was [facility] with Seizure Disorder, A-Fib, with rapid ventricular response, Diarrhea, and urinary frequency.   OT comments  Pt. Is an 83 y.o. Male who [facility] with Seizure Disorder, A-Fib, and rapid ventricular response, diarrhea, and urinary frequency. Pt. was provided with education about energy conservation, work simplification techniques in, and around home, and a visual handout was provided. Pt. continues to plan to return home upon discharge with wife.   Follow Up Recommendations  No OT follow up    Equipment Recommendations       Recommendations for Other Services      Precautions / Restrictions                                               ADL                                     General ADL Comments: Pt. education was provided about energy conservation/work simplification techniques, home set-up, and anticipated home needs. Pt. was provided with a visual handout.      Vision                     Perception     Praxis      Cognition   Behavior During Therapy: WFL for tasks assessed/performed Overall Cognitive Status: Within Functional Limits for tasks assessed                       Extremity/Trunk Assessment               Exercises     Shoulder Instructions       General Comments      Pertinent Vitals/ Pain       Pain Assessment: 0-10 Pain Score: 0-No pain  Home Living                                          Prior Functioning/Environment              Frequency  Min 1X/week        Progress Toward Goals  OT Goals(current goals can now be found in the care plan section)  Progress towards OT goals: Progressing toward goals  Acute Rehab OT Goals Patient  Stated Goal: to return home, regain independence OT Goal Formulation: With patient Potential to Achieve Goals: Good  Plan Discharge plan remains appropriate    Co-evaluation                 End of Session     Activity Tolerance Patient tolerated treatment well   Patient Left with call bell/phone within reach;in bed;with bed alarm set   Nurse Communication          Time: [id number] OT Time Calculation (min): 8 min  Charges: OT General Charges $OT Visit: 1 Procedure OT Treatments $Self Care/Home Management : 8-22 mins  Olegario MessierElaine Nayla Dias, MS, OTR/L 02/24/2016, 2:18 PM

## 2016-02-24 NOTE — Progress Notes (Signed)
A&O but forgetful. Up with assist. No complaints. Possible d/c today. Still afib on tele, rate controlled.

## 2016-02-24 NOTE — Progress Notes (Signed)
PT Cancellation Note  Patient Details Name: Zachary RavelSamuel D Peterson MRN: [id number] DOB: 1931-08-03   Cancelled Treatment:    Reason Eval/Treat Not Completed: Patient declined, no reason specified. Patient reports he just got back to bed from walking around and would prefer to stay in bed as he will be going home soon, declines any OOB mobility. PT will re-attempt at later time/date.   [name] PT, DPT, CSCS    02/24/2016, 12:40 PM

## 2016-02-24 NOTE — Discharge Summary (Signed)
[facility] - [place] at [facility]   PATIENT NAME: Zachary Peterson    MR#:  [id number]  DATE OF BIRTH:  09-Aug-1931  DATE OF ADMISSION:  02/20/2016 ADMITTING PHYSICIAN: Auburn Bilberry, MD  DATE OF DISCHARGE: 02/24/16 PRIMARY CARE PHYSICIAN: Ignacia Palma., MD    ADMISSION DIAGNOSIS:  Seizure (HCC) [R56.9] Elevated troponin I level [R74.8] Demand ischemia of myocardium (HCC) [I24.8] Diarrhea, unspecified type [R19.7]  DISCHARGE DIAGNOSIS:  Principal Problem:   Seizure (HCC) Active Problems:   Atrial fibrillation with RVR (HCC)   Viral gastroenteritis   Hypokalemia   Demand ischemia of myocardium (HCC)   Essential hypertension   Diarrhea   Difficulty in walking, not elsewhere classified   Muscle weakness (generalized)   SECONDARY DIAGNOSIS:   Past Medical History:  Diagnosis Date  . Back pain   . COPD (chronic obstructive pulmonary disease) (HCC)   . Depression   . Hearing loss   . Hyperlipemia   . Hypertension   . Migraine   . PAF (paroxysmal atrial fibrillation) (HCC)    a. 02/2016 Echo: EF 66-65%, mild MR;  b. CHA2DS2VASc = 3. No OAC 2/2 h/o seizures and falls-->therefor initial attempt @ rate control (02/2016).  . Palpitations   . Seizure (HCC)   . Syncope    a. related to seizure disorder    HOSPITAL COURSE:  HISTORY OF PRESENT ILLNESS: Zachary Peterson  is a 83 y.o. male with a known history of  Seizure disorder, COPD, depression, essential hypertension, hyperlipidemia, history of migraines as well as remote history of atrial fibrillation with has had issues with his back hurting and has not been able to ambulate much. Who started feeling nauseous and throwing up since this morning. Patient subsequently had a episode of seizure witnessed by family. He was brought to the emergency room. Patient on arrival was noted to be tachycardic. He was given IV fluid boluses heart rate continues to be elevated his nausea vomiting and diarrhea have resolved  since. However his heart rate continues to be elevated. Patient also noticed to have troponin was elevated at 0.70. He is denying any chest pains or palpitations. No shortness of breath. States that his abdomen feels weird.  Hospital course  #1 Afib with rvr- remote history, new onset now, likely has paroxysmal Afib - likely triggered by seizure and gastroenteritis - Appreciate cards consult - off the amiodarone drip, no need for oral amiodarone as duration of afib not known, and he remains in the rhythm -Change to short-acting to long-acting Cardizem 120 milligrams once daily -Metoprolol 25 mg by mouth twice a day - on Heparin now for anticoagulation, ECHO normal- d/c heparin today - not a candidate for long term anticoagulation due to bleeding peptic ulcers and seizure diosrder -Discontinued patient's home medication amlodipine as he is on Cardizem 120 mg  #2 Elevated troponin- likely demand ischemia from afib and elevated heart rate - ECHO normal with normal EF 55% - off heparin drip secondary to peptic ulcer disease and history of seizures cardiology might consider starting him on anticoagulants in the near future  #3 Hypokalemia- replaced, Mag level improved as well  #4 Dysuria- resolved with fluids, UA and cultures negative  #5 Seizure disorder- followed by [facility] neurology, continue phenytoin, also uses valium prn for aura Outpatient follow-up with neurology at [facility] CT head ordered as patient syncopized probably from seizures  #6 Low back pain-scheduled to f/u with orthopedic surgery next week MRI done as outpt with significant disc disease -  PTrecommending home health PT and a 5 inch wheel  rolling walker  #7 Acute norovirus gastroenteritis-diarrhea resolving. Discontinue IV fluids as patient is tolerating diet   #8 DVT prophylaxis- started the patient on Lovenox. Discontinued heparin drip    DISCHARGE CONDITIONS:   FAIR   CONSULTS OBTAINED:  Treatment Team:   Yvonne Kendall, MD   PROCEDURES NONE   DRUG ALLERGIES:   Allergies  Allergen Reactions  . Aspirin Other (See Comments)    CAUSES ULCERS  . Codeine Nausea And Vomiting  . Oxycodone Nausea And Vomiting    SEIZURES    DISCHARGE MEDICATIONS:   Current Discharge Medication List    START taking these medications   Details  aspirin EC 81 MG EC tablet Take 1 tablet (81 mg total) by mouth daily.    calcium carbonate (TUMS - DOSED IN MG ELEMENTAL CALCIUM) 500 MG chewable tablet Chew 1 tablet (200 mg of elemental calcium total) by mouth 3 (three) times daily with meals as needed for indigestion or heartburn.    diltiazem (CARDIZEM CD) 120 MG 24 hr capsule Take 1 capsule (120 mg total) by mouth daily. Qty: 30 capsule, Refills: 0    metoprolol tartrate (LOPRESSOR) 25 MG tablet Take 1 tablet (25 mg total) by mouth 2 (two) times daily. Qty: 60 tablet, Refills: 0    pantoprazole (PROTONIX) 20 MG tablet Take 1 tablet (20 mg total) by mouth daily.      CONTINUE these medications which have NOT CHANGED   Details  diazepam (VALIUM) 5 MG tablet Take 5 mg by mouth 2 (two) times daily. ANXIETY    DILANTIN 100 MG ER capsule Take 100 mg by mouth 3 (three) times daily.    ezetimibe-simvastatin (VYTORIN) 10-80 MG per tablet Take 1 tablet by mouth every evening.     fluticasone (FLONASE) 50 MCG/ACT nasal spray Place 1 spray into both nostrils daily.    hydrochlorothiazide (HYDRODIURIL) 25 MG tablet Take 25 mg by mouth daily.    hydroxypropyl methylcellulose / hypromellose (ISOPTO TEARS / GONIOVISC) 2.5 % ophthalmic solution Place 1 drop into both eyes 3 (three) times daily as needed for dry eyes.    ibuprofen (ADVIL,MOTRIN) 200 MG tablet Take 200 mg by mouth every 6 (six) hours as needed for headache or moderate pain.     mirtazapine (REMERON) 15 MG tablet Take 15 mg by mouth at bedtime.    ondansetron (ZOFRAN ODT) 8 MG disintegrating tablet Take 1 tablet (8 mg total) by mouth every 8  (eight) hours as needed for nausea or vomiting. Qty: 15 tablet, Refills: 0    pregabalin (LYRICA) 75 MG capsule Take 75 mg by mouth 2 (two) times daily.    tiotropium (SPIRIVA) 18 MCG inhalation capsule Place 18 mcg into inhaler and inhale daily.    HYDROcodone-acetaminophen (NORCO) 5-325 MG per tablet Take 1 tablet by mouth every 6 (six) hours as needed for moderate pain. Qty: 15 tablet, Refills: 0      STOP taking these medications     amLODipine (NORVASC) 10 MG tablet          DISCHARGE INSTRUCTIONS:   Follow-up with primary care physician in a week Follow-up with cardiology Dr. Lewie Loron in a week Follow-up with neurology-primary as recommended  DIET:  Cardiac diet  DISCHARGE CONDITION:  Stable  ACTIVITY:  Activity as tolerated  OXYGEN:  Home Oxygen: No.   Oxygen Delivery: room air  DISCHARGE LOCATION:  home   If you experience worsening of your admission symptoms,  develop shortness of breath, life threatening emergency, suicidal or homicidal thoughts you must seek medical attention immediately by calling 911 or calling your MD immediately  if symptoms less severe.  You Must read complete instructions/literature along with all the possible adverse reactions/side effects for all the Medicines you take and that have been prescribed to you. Take any new Medicines after you have completely understood and accpet all the possible adverse reactions/side effects.   Please note  You were cared for by a hospitalist during your hospital stay. If you have any questions about your discharge medications or the care you received while you were in the hospital after you are discharged, you can call the unit and asked to speak with the hospitalist on call if the hospitalist that took care of you is not available. Once you are discharged, your primary care physician will handle any further medical issues. Please note that NO REFILLS for any discharge medications will be authorized once  you are discharged, as it is imperative that you return to your primary care physician (or establish a relationship with a primary care physician if you do not have one) for your aftercare needs so that they can reassess your need for medications and monitor your lab values.     Today  Chief Complaint  Patient presents with  . Seizures   Patient is doing fine. Diarrhea resolved. PT recommended home health PT. Wife at bedside  ROS:  CONSTITUTIONAL: Denies fevers, chills. Denies any fatigue, weakness.  EYES: Denies blurry vision, double vision, eye pain. EARS, NOSE, THROAT: Denies tinnitus, ear pain, hearing loss. RESPIRATORY: Denies cough, wheeze, shortness of breath.  CARDIOVASCULAR: Denies chest pain, palpitations, edema.  GASTROINTESTINAL: Denies nausea, vomiting, diarrhea, abdominal pain. Denies bright red blood per rectum. GENITOURINARY: Denies dysuria, hematuria. ENDOCRINE: Denies nocturia or thyroid problems. HEMATOLOGIC AND LYMPHATIC: Denies easy bruising or bleeding. SKIN: Denies rash or lesion. MUSCULOSKELETAL: Denies pain in neck, back, shoulder, knees, hips or arthritic symptoms.  NEUROLOGIC: Denies paralysis, paresthesias.  PSYCHIATRIC: Denies anxiety or depressive symptoms.   VITAL SIGNS:  Blood pressure 115/60, pulse (!) 106, temperature 97.5 F (36.4 C), temperature source Oral, resp. rate 12, height 5\' 7"  (1.702 m), weight 65.4 kg (144 lb 1.6 oz), SpO2 96 %.  I/O:    Intake/Output Summary (Last 24 hours) at 02/24/16 1539 Last data filed at 02/24/16 0504  Gross per 24 hour  Intake                0 ml  Output              850 ml  Net             -850 ml    PHYSICAL EXAMINATION:  GENERAL:  83 y.o.-year-old patient lying in the bed with no acute distress.  EYES: Pupils equal, round, reactive to light and accommodation. No scleral icterus. Extraocular muscles intact.  HEENT: Head atraumatic, normocephalic. Oropharynx and nasopharynx clear.  NECK:  Supple, no  jugular venous distention. No thyroid enlargement, no tenderness.  LUNGS: Normal breath sounds bilaterally, no wheezing, rales,rhonchi or crepitation. No use of accessory muscles of respiration.  CARDIOVASCULAR: S1, S2 normal. No murmurs, rubs, or gallops.  ABDOMEN: Soft, non-tender, non-distended. Bowel sounds present. No organomegaly or mass.  EXTREMITIES: No pedal edema, cyanosis, or clubbing.  NEUROLOGIC: Cranial nerves II through XII are intact. Muscle strength 5/5 in all extremities. Sensation intact. Gait not checked.  PSYCHIATRIC: The patient is alert and oriented x 3.  SKIN: No obvious rash, lesion, or ulcer.   DATA REVIEW:   CBC  Recent Labs Lab 02/24/16 0452  WBC 6.6  HGB 13.1  HCT 38.2*  PLT 205    Chemistries   Recent Labs Lab 02/22/16 0353 02/23/16 0328  NA 141 140  K 4.0 4.0  CL 111 111  CO2 25 25  GLUCOSE 89 93  BUN 11 10  CREATININE 0.97 0.94  CALCIUM 7.8* 7.3*  MG 1.9  --     Cardiac Enzymes  Recent Labs Lab 02/21/16 1047  TROPONINI 0.03*    Microbiology Results  Results for orders placed or performed during the hospital encounter of 02/20/16  Urine culture     Status: None   Collection Time: 02/21/16 11:15 AM  Result Value Ref Range Status   Specimen Description URINE, RANDOM  Final   Special Requests NONE  Final   Culture   Final    NO GROWTH Performed at [facility], [address] [place], [place] [address]    Report Status 02/22/2016 FINAL  Final  C difficile quick scan w PCR reflex     Status: None   Collection Time: 02/21/16  8:03 PM  Result Value Ref Range Status   C Diff antigen NEGATIVE NEGATIVE Final   C Diff toxin NEGATIVE NEGATIVE Final   C Diff interpretation No C. difficile detected.  Final  Gastrointestinal Panel by PCR , Stool     Status: Abnormal   Collection Time: 02/21/16  8:03 PM  Result Value Ref Range Status   Campylobacter species NOT DETECTED NOT DETECTED Final   Plesimonas shigelloides NOT  DETECTED NOT DETECTED Final   Salmonella species NOT DETECTED NOT DETECTED Final   Yersinia enterocolitica NOT DETECTED NOT DETECTED Final   Vibrio species NOT DETECTED NOT DETECTED Final   Vibrio cholerae NOT DETECTED NOT DETECTED Final   Enteroaggregative E coli (EAEC) NOT DETECTED NOT DETECTED Final   Enteropathogenic E coli (EPEC) NOT DETECTED NOT DETECTED Final   Enterotoxigenic E coli (ETEC) NOT DETECTED NOT DETECTED Final   Shiga like toxin producing E coli (STEC) NOT DETECTED NOT DETECTED Final   Shigella/Enteroinvasive E coli (EIEC) NOT DETECTED NOT DETECTED Final   Cryptosporidium NOT DETECTED NOT DETECTED Final   Cyclospora cayetanensis NOT DETECTED NOT DETECTED Final   Entamoeba histolytica NOT DETECTED NOT DETECTED Final   Giardia lamblia NOT DETECTED NOT DETECTED Final   Adenovirus F40/41 NOT DETECTED NOT DETECTED Final   Astrovirus NOT DETECTED NOT DETECTED Final   Norovirus GI/GII DETECTED (A) NOT DETECTED Final    Comment: RESULT CALLED TO, READ BACK BY AND VERIFIED WITH: KARA CAMPBELL 02/21/16 @ 2200  MLK    Rotavirus A NOT DETECTED NOT DETECTED Final   Sapovirus (I, II, IV, and V) NOT DETECTED NOT DETECTED Final    RADIOLOGY:  Ct Head Wo Contrast  Result Date: 02/23/2016 CLINICAL DATA:  Seizure.  Syncope EXAM: CT HEAD WITHOUT CONTRAST TECHNIQUE: Contiguous axial images were obtained from the base of the skull through the vertex without intravenous contrast. COMPARISON:  May 17, 2011 FINDINGS: Brain: Moderate diffuse atrophy is stable. There is no intracranial mass, hemorrhage, extra-axial fluid collection, or midline shift. There is patchy small vessel disease in the centra semiovale bilaterally, stable. No new gray-white compartment lesions are evident. No acute infarct present. Vascular: No hyperdense vessel. There is calcification in the carotid siphon regions bilaterally. Skull: The bony calvarium appears intact. A small soft tissue opacity is noted over  the right  parietal bone measuring approximately 6 mm, stable. Sinuses/Orbits: There is mucosal thickening in several ethmoid air cells bilaterally. Visualized paranasal sinuses elsewhere are clear. There is mild rightward deviation of the nasal septum. Orbits appear symmetric bilaterally. Other: Visualized mastoids are clear. IMPRESSION: Atrophy with periventricular small vessel disease, stable. No mass, hemorrhage, or extra-axial fluid collection. No acute infarct. Foci of arterial vascular calcification noted. Mild ethmoid sinus disease present. Deviated nasal septum. Electronically Signed   By: Bretta Bang III M.D.   On: 02/23/2016 14:11    EKG:   Orders placed or performed during the hospital encounter of 02/20/16  . EKG 12-Lead  . EKG 12-Lead  . EKG 12-Lead  . EKG 12-Lead  . EKG 12-Lead  . EKG 12-Lead  . EKG 12-Lead  . EKG 12-Lead  . EKG 12-Lead  . EKG 12-Lead      Management plans discussed with the patient, family and they are in agreement.  CODE STATUS:     Code Status Orders        Start     Ordered   02/20/16 2115  Full code  Continuous     02/20/16 2115    Code Status History    Date Active Date Inactive Code Status Order ID Comments User Context   This patient has a current code status but no historical code status.      TOTAL TIME TAKING CARE OF THIS PATIENT: 45  minutes.   Note: This dictation was prepared with Dragon dictation along with smaller phrase technology. Any transcriptional errors that result from this process are unintentional.   @M[facility] @  on 02/24/2016 at 3:39 PM  Between 7am to 6pm - Pager - [phone]  After 6pm go to [email or web address] - password [email or web address] [facility]  [facility]  Office  [phone]  CC: Primary care physician; Ignacia Palma., MD

## 2016-02-24 NOTE — Care Management Important Message (Signed)
Important Message  Patient Details  Name: Zachary RavelSamuel D Peterson MRN: [id number] Date of Birth: 1931/05/08   Medicare Important Message Given:  Yes    Eber HongGreene, Anaria Kroner R, RN 02/24/2016, 8:58 AM

## 2016-02-24 NOTE — Progress Notes (Signed)
IV and tele removed from patient. Discharge instructions given to patient along with hard copy prescriptions. Patient and family verbalized understanding. No distress at this time. Wife and son at bedside and will be transporting patient home.

## 2016-02-24 NOTE — Discharge Instructions (Signed)
Follow-up with primary care physician in a week Follow-up with cardiology Dr. Lewie LoronGolan in a week Follow-up with neurology-primary as recommended

## 2016-03-02 ENCOUNTER — Ambulatory Visit (INDEPENDENT_AMBULATORY_CARE_PROVIDER_SITE_OTHER): Payer: Medicare HMO | Admitting: Nurse Practitioner

## 2016-03-02 ENCOUNTER — Encounter: Payer: Self-pay | Admitting: Nurse Practitioner

## 2016-03-02 VITALS — BP 124/74 | HR 94 | Ht 67.0 in | Wt 153.2 lb

## 2016-03-02 DIAGNOSIS — I10 Essential (primary) hypertension: Secondary | ICD-10-CM | POA: Diagnosis not present

## 2016-03-02 DIAGNOSIS — I4819 Other persistent atrial fibrillation: Secondary | ICD-10-CM

## 2016-03-02 DIAGNOSIS — I481 Persistent atrial fibrillation: Secondary | ICD-10-CM

## 2016-03-02 MED ORDER — DILTIAZEM HCL ER COATED BEADS 240 MG PO CP24: 240.0000 mg | ORAL_CAPSULE | Freq: Every day | ORAL | 3 refills | Status: DC

## 2016-03-02 NOTE — Progress Notes (Signed)
Office Visit    Patient Name: Zachary Peterson Date of Encounter: 03/02/2016  Primary Care Provider:  Ignacia Palma., MD Primary Cardiologist:  Wallace Cullens, MD   Chief Complaint    83 year old male with a prior history of seizure disorder, hypertension, hyperlipidemia, and paroxysmal atrial fibrillation, who was recently admitted for diarrhea, norovirus, seizure, and rapid afib, who presents for f/u.  Past Medical History    Past Medical History:  Diagnosis Date  . Back pain   . COPD (chronic obstructive pulmonary disease) (HCC)   . Depression   . Hearing loss   . History of GI bleed 1976  . Hyperlipemia   . Hypertension   . Migraine   . PAF (paroxysmal atrial fibrillation) (HCC)    a. 02/2016 Echo: EF 66-65%, mild MR;  b. CHA2DS2VASc = 3. No OAC 2/2 h/o seizures and falls-->therefor initial attempt @ rate control (02/2016).  . Palpitations   . Seizure (HCC)   . Syncope    a. related to seizure disorder   Past Surgical History:  Procedure Laterality Date  . APPENDECTOMY    . BACK SURGERY    . CATARACT EXTRACTION    . hemmoridectomy    . HERNIA REPAIR      Allergies  Allergies  Allergen Reactions  . Aspirin Other (See Comments)    CAUSES ULCERS  . Codeine Nausea And Vomiting  . Oxycodone Nausea And Vomiting    SEIZURES    History of Present Illness    83 year old male with the above complex past medical history including seizure disorder, hypertension, hyperlipidemia, COPD, peptic ulcer disease with remote GI bleed, syncope, and paroxysmal atrial fibrillation. He says that he was first diagnosed with an irregular heartbeat age 76. He was initially managed with quinidine. He says later in life, he wore a monitor and had extra beats up to about 10 times per minute. This sounds more like he was having either PACs or PVCs. He was never on oral anticoagulation.  He was recently admitted to [facility] about 10 days ago with watery diarrhea followed by seizure on the  day of admission. In the emergency department, he was noted to be in atrial fibrillation. Troponin was mildly elevated. He was initially placed on amiodarone, but this was discontinued as it was unclear how long he had been in atrial fibrillation. Rate was managed with diltiazem and metoprolol. CHA2DS2VASc is 3 however, he was not felt to be a good candidate for anticoagulation secondary to falls, seizures, and prior history of GI bleed in 1976. Echocardiogram was performed and showed normal LV function with mild mitral regurgitation. His stool tested positive for norovirus. Following recovery, he was discharged home on oral metoprolol and diltiazem. Since his discharge, he has had some fatigue, saying he doesn't really feel like doing anything.  This had apparently started prior to his hospitalization in the setting of low back and left hip pain but is worse since hospitalization. He has not been having a chest pain, palpitations, PND, orthopnea, dizziness, syncope, or early satiety. He does have mild lower extremity swelling. He is not aware that he remains in atrial flutter/fibrillation.  Home Medications    Prior to Admission medications   Medication Sig Start Date End Date Taking? Authorizing Provider  aspirin EC 81 MG EC tablet Take 1 tablet (81 mg total) by mouth daily. 02/25/16  Yes Ramonita Lab, MD  calcium carbonate (TUMS - DOSED IN MG ELEMENTAL CALCIUM) 500 MG chewable tablet Chew 1  tablet (200 mg of elemental calcium total) by mouth 3 (three) times daily with meals as needed for indigestion or heartburn. 02/24/16  Yes Ramonita Lab, MD  diazepam (VALIUM) 5 MG tablet Take 5 mg by mouth 2 (two) times daily. ANXIETY   Yes Historical Provider, MD  DILANTIN 100 MG ER capsule Take 100 mg by mouth 3 (three) times daily. 04/12/14  Yes Historical Provider, MD  ezetimibe-simvastatin (VYTORIN) 10-80 MG per tablet Take 1 tablet by mouth every evening.    Yes Historical Provider, MD  fluticasone (FLONASE) 50  MCG/ACT nasal spray Place 1 spray into both nostrils daily.   Yes Historical Provider, MD  hydrochlorothiazide (HYDRODIURIL) 25 MG tablet Take 25 mg by mouth daily.   Yes Historical Provider, MD  HYDROcodone-acetaminophen (NORCO) 5-325 MG per tablet Take 1 tablet by mouth every 6 (six) hours as needed for moderate pain. 04/13/14  Yes Tatyana Kirichenko, PA-C  hydroxypropyl methylcellulose / hypromellose (ISOPTO TEARS / GONIOVISC) 2.5 % ophthalmic solution Place 1 drop into both eyes 3 (three) times daily as needed for dry eyes.   Yes Historical Provider, MD  ibuprofen (ADVIL,MOTRIN) 200 MG tablet Take 200 mg by mouth every 6 (six) hours as needed for headache or moderate pain.    Yes Historical Provider, MD  mirtazapine (REMERON) 15 MG tablet Take 15 mg by mouth at bedtime.   Yes Historical Provider, MD  ondansetron (ZOFRAN ODT) 8 MG disintegrating tablet Take 1 tablet (8 mg total) by mouth every 8 (eight) hours as needed for nausea or vomiting. 04/13/14  Yes Tatyana Kirichenko, PA-C  pantoprazole (PROTONIX) 20 MG tablet Take 1 tablet (20 mg total) by mouth daily. 02/24/16  Yes Ramonita Lab, MD  pregabalin (LYRICA) 75 MG capsule Take 75 mg by mouth 2 (two) times daily.   Yes Historical Provider, MD  tiotropium (SPIRIVA) 18 MCG inhalation capsule Place 18 mcg into inhaler and inhale daily.   Yes Historical Provider, MD  diltiazem (CARDIZEM CD) 240 MG 24 hr capsule Take 1 capsule (240 mg total) by mouth daily. 03/02/16 05/31/16  Ok Anis, NP    Review of Systems    As above, he is had lower energy levels. He denies chest pain, palpitations, PND, orthopnea, dizziness, syncope, or early satiety. He does have occasional, mild ankle edema.  All other systems reviewed and are otherwise negative except as noted above.  Physical Exam    VS:  BP 124/74 (BP Location: Left Arm, Patient Position: Sitting, Cuff Size: Normal)   Pulse 94   Ht 5\' 7"  (1.702 m)   Wt 153 lb 4 oz (69.5 kg)   BMI 24.00 kg/m  ,  BMI Body mass index is 24 kg/m. GEN: Well nourished, well developed, in no acute distress.  HEENT: normal.  Neck: Supple, no JVD, carotid bruits, or masses. Cardiac: Irregularly irregular, no murmurs, rubs, or gallops. No clubbing, cyanosis, edema.  Radials/DP/PT 1+ and equal bilaterally.  Respiratory:  Respirations regular and unlabored, clear to auscultation bilaterally. GI: Soft, nontender, nondistended, BS + x 4. MS: no deformity or atrophy. Skin: warm and dry, no rash. Neuro:  Strength and sensation are intact. Psych: Normal affect.  Accessory Clinical Findings    ECG - Atrial flutter with variable conduction, 80, right ventricular conduction delay, no acute changes.  Assessment & Plan    1.  Persistent atrial flutter/fibrillation: Patient reports prior history of irregular heartbeat, though it is not clear that he was ever firmly diagnosed with atrial fibrillation. We have an  ECG from his primary care provider's office from August 2017 which shows sinus rhythm. He has never been on anticoagulation in the past. He recently presented to [facility] with norovirus and seizure and was noted to be in A. fib with RVR. He has been well rate controlled on diltiazem and metoprolol. Rate is 80-90 today. He has had fatigue since his discharge without overt heart failure symptoms or chest pain. His daughter is here today and questions whether or not metoprolol may be playing a role. It certainly may be and as result, we will discontinue this and titrate his diltiazem to 240 mg daily. We talked at length about his fatigue and how atrial fibrillation may cause similar symptoms. We will see how he does off of beta blocker. We also discussed the role of oral anticoagulation in the management of atrial fibrillation. His daughter has concerns about his stroke risk. The previously opted to manage conservatively without oral anticoagulation in the setting of prior history of seizures, falls, and remote GI  bleed. If he were to remain fatigued despite discontinuation of beta blocker therapy, we could give consideration to trying to restore sinus rhythm, however this would require at least short-term oral anticoagulation. At this point, neither patient nor family are necessarily opposed to this. I recommended that they talk it over with family again (one dtr is here today, she wants to talk it over with other dtr), as family had previously decided a more conservative approach. I also pointed out that stroke risk persists even with a rhythm management approach. His daughter pointed out that he has only had one seizure in the past 2 years and his GI bleed was in 1976. I will arrange for follow-up with Dr. Okey DupreEnd in 2 weeks to reevaluate his symptoms and determine if oral anticoagulation and potentially rhythm management is appropriate at that point.  2. Essential hypertension: Blood pressure is stable on beta blocker and diltiazem. As above, I'm discontinuing beta blocker in favor of doubling his diltiazem dose.   3. Seizure disorder: No recurrence of seizures since discharge. This is followed by neurology.  4.  Dispo:  F/u in 2 wks.  Nicolasa Duckinghristopher Berge, NP 03/02/2016, 3:48 PM

## 2016-03-02 NOTE — Patient Instructions (Signed)
Medication Instructions:  Your physician has recommended you make the following change in your medication:  1. STOP Metoprolol 2. START Diltiazem 240 mg Once a day   Labwork: None today  Testing/Procedures: None today  Follow-Up: Your physician recommends that you schedule a follow-up appointment in: 2 weeks with Dr. Okey DupreEnd.   It was a pleasure seeing you today here in the office. Please do not hesitate to give us a call back if you have any further questions. [phone]  [place] CellarPamela A. RN, BSN

## 2016-03-05 ENCOUNTER — Telehealth: Payer: Self-pay | Admitting: Internal Medicine

## 2016-03-05 NOTE — Telephone Encounter (Signed)
We can try to have Mr. Hilgert see me at 8 AM on Wednesday (2/28). If his symptoms or heart rate worsen in the meantime, he should go to the ED for further evaluation. Thanks.

## 2016-03-05 NOTE — Telephone Encounter (Signed)
Pt daughter calling since patient changed his medications around he's not feeling well His BP 125/73 hr 107 This morning  Pt just states he is not feeling well, not like normal Please advise. . The changes were to stop Metoprolol and to start Diltiazem 240 mg Once a day

## 2016-03-05 NOTE — Telephone Encounter (Signed)
Called and spoke with patient again. He took his BP and it was 127/69, HR 104. He and his family are concerned with his HR being above 100. Patient denies any symptoms besides not having any "get up and go." Advised to keep upcoming appt on 03/21/16 with Dr End.  Routing to Dr End for advice.

## 2016-03-05 NOTE — Telephone Encounter (Signed)
Left message with Teressa LowerDenise Davis to call back.  Called and s/w patient. He says he doesn't have his "get up and go." Switched from metoprolol to diltiazem on OV with Ward Givenshris Berge, NP on 03/02/16. I asked if it had been since he stopped the metoprolol and started the Diltiazem and he stated "No I've felt like this since before the hospital." He stated his HR usually runs in around 94-96, but today it was 107. BP today 125/73. Denies palpitations, chest pain, SOB, or dizziness. Verified that he is taking diltiazem 240 mg by mouth daily and he agreed. Patient has appt on 03/21/16 with Dr End. Advised to call us if he develops any new or worsening symptoms.  Patient gave me verbal consent to speak with his daughter, Teressa LowerDenise Davis, and he verbalized understanding that he would need to sign DPR so that we can talk with his family if that's ok with him.

## 2016-03-05 NOTE — Telephone Encounter (Signed)
S/w with patient and appt scheduled for 03/07/16 at 1100. Patient verbalized understanding to proceed to ED for evaluation if symptoms worsen or heart rate worsens.  Also, called and notified Angelique BlonderDenise, patient's daughter, of recommendations and appt date and time. She verbalized understanding. We discussed that patient is hard of hearing and I wanted to make sure the information was received by his family as well. She was very Adult nurseappreciative.

## 2016-03-05 NOTE — Telephone Encounter (Signed)
Received incoming call from Zachary Peterson, ok per dpr. She says she has received information secondhand from her mother about the patient. That pulse was 107 today, and apparently 102 yesterday. Also, that patient does not have as much energy as he normally does.

## 2016-03-07 ENCOUNTER — Ambulatory Visit (INDEPENDENT_AMBULATORY_CARE_PROVIDER_SITE_OTHER): Payer: Medicare HMO | Admitting: Internal Medicine

## 2016-03-07 ENCOUNTER — Encounter: Payer: Self-pay | Admitting: Internal Medicine

## 2016-03-07 VITALS — BP 140/62 | HR 79 | Ht 67.0 in | Wt 151.0 lb

## 2016-03-07 DIAGNOSIS — I4819 Other persistent atrial fibrillation: Secondary | ICD-10-CM

## 2016-03-07 DIAGNOSIS — I10 Essential (primary) hypertension: Secondary | ICD-10-CM

## 2016-03-07 DIAGNOSIS — I481 Persistent atrial fibrillation: Secondary | ICD-10-CM | POA: Diagnosis not present

## 2016-03-07 MED ORDER — WARFARIN SODIUM 2.5 MG PO TABS: 2.5000 mg | ORAL_TABLET | Freq: Every day | ORAL | 0 refills | Status: DC

## 2016-03-07 MED ORDER — DILTIAZEM HCL ER COATED BEADS 300 MG PO CP24: 300.0000 mg | ORAL_CAPSULE | Freq: Every day | ORAL | 3 refills | Status: DC

## 2016-03-07 NOTE — Progress Notes (Signed)
Office Visit    Patient Name: Zachary Peterson Date of Encounter: 03/07/16  Primary Care Provider:  Ignacia Palma., MD Primary Cardiologist:  Wallace Cullens, MD   Chief Complaint    83 year old male with a prior history of seizure disorder, hypertension, hyperlipidemia, and paroxysmal atrial fibrillation, who was recently admitted for diarrhea, norovirus, seizure, and rapid afib, who presents for f/u.  Past Medical History    Past Medical History:  Diagnosis Date  . Back pain   . COPD (chronic obstructive pulmonary disease) (HCC)   . Depression   . Hearing loss   . History of GI bleed 1976  . Hyperlipemia   . Hypertension   . Migraine   . PAF (paroxysmal atrial fibrillation) (HCC)    a. 02/2016 Echo: EF 66-65%, mild MR;  b. CHA2DS2VASc = 3. No OAC 2/2 h/o seizures and falls-->therefor initial attempt @ rate control (02/2016).  . Palpitations   . Seizure (HCC)   . Syncope    a. related to seizure disorder   Past Surgical History:  Procedure Laterality Date  . APPENDECTOMY    . BACK SURGERY    . CATARACT EXTRACTION    . hemmoridectomy    . HERNIA REPAIR      Allergies  Allergies  Allergen Reactions  . Aspirin Other (See Comments)    CAUSES ULCERS  . Codeine Nausea And Vomiting  . Oxycodone Nausea And Vomiting    SEIZURES    History of Present Illness    83 year old male with the above complex past medical history including seizure disorder, hypertension, hyperlipidemia, COPD, peptic ulcer disease with remote GI bleed, syncope, and paroxysmal atrial fibrillation. He says that he was first diagnosed with an irregular heartbeat age 66. He was initially managed with quinidine. He says later in life, he wore a monitor and had extra beats up to about 10 times per minute. This sounds more like he was having either PACs or PVCs. He was never on oral anticoagulation.  He was recently admitted to [facility] about 10 days ago with watery diarrhea followed by seizure on the day  of admission. In the emergency department, he was noted to be in atrial fibrillation. Troponin was mildly elevated. He was initially placed on amiodarone, but this was discontinued as it was unclear how long he had been in atrial fibrillation. Rate was managed with diltiazem and metoprolol. CHA2DS2VASc is 3 however, he was not felt to be a good candidate for anticoagulation secondary to falls, seizures, and prior history of GI bleed in 1976. Echocardiogram was performed and showed normal LV function with mild mitral regurgitation. His stool tested positive for norovirus. Following recovery, he was discharged home on oral metoprolol and diltiazem. He was seen in clinic last week by Ward Givens, NP, at which time metoprolol was discontinued and diltiazem increased due to fatigue and suboptimal rate control.  The patient presents today for an urgent visit due to continued fatigue. He and his family also reported heart rates at rest up to 110 bpm. He denies palpitations but has increased exertional dyspnea ball beyond his baseline. He has not had any chest pain. His legs are slightly swollen last week, but that has since resolved since resumption of his chronic diuretic therapy. He is currently on low-dose aspirin without bleeding. He has not had any falls. His wife notes apneic spells at night. The patient has never undergone a sleep study.  Home Medications    Prior to Admission medications  Medication Sig Start Date Zachary Peterson Date Taking? Authorizing Provider  aspirin EC 81 MG EC tablet Take 1 tablet (81 mg total) by mouth daily. 02/25/16  Yes Ramonita Lab, MD  calcium carbonate (TUMS - DOSED IN MG ELEMENTAL CALCIUM) 500 MG chewable tablet Chew 1 tablet (200 mg of elemental calcium total) by mouth 3 (three) times daily with meals as needed for indigestion or heartburn. 02/24/16  Yes Ramonita Lab, MD  diazepam (VALIUM) 5 MG tablet Take 5 mg by mouth 2 (two) times daily. ANXIETY   Yes Historical Provider, MD    DILANTIN 100 MG ER capsule Take 100 mg by mouth 3 (three) times daily. 04/12/14  Yes Historical Provider, MD  ezetimibe-simvastatin (VYTORIN) 10-80 MG per tablet Take 1 tablet by mouth every evening.    Yes Historical Provider, MD  fluticasone (FLONASE) 50 MCG/ACT nasal spray Place 1 spray into both nostrils daily.   Yes Historical Provider, MD  hydrochlorothiazide (HYDRODIURIL) 25 MG tablet Take 25 mg by mouth daily.   Yes Historical Provider, MD  HYDROcodone-acetaminophen (NORCO) 5-325 MG per tablet Take 1 tablet by mouth every 6 (six) hours as needed for moderate pain. 04/13/14  Yes Tatyana Kirichenko, PA-C  hydroxypropyl methylcellulose / hypromellose (ISOPTO TEARS / GONIOVISC) 2.5 % ophthalmic solution Place 1 drop into both eyes 3 (three) times daily as needed for dry eyes.   Yes Historical Provider, MD  ibuprofen (ADVIL,MOTRIN) 200 MG tablet Take 200 mg by mouth every 6 (six) hours as needed for headache or moderate pain.    Yes Historical Provider, MD  mirtazapine (REMERON) 15 MG tablet Take 15 mg by mouth at bedtime.   Yes Historical Provider, MD  ondansetron (ZOFRAN ODT) 8 MG disintegrating tablet Take 1 tablet (8 mg total) by mouth every 8 (eight) hours as needed for nausea or vomiting. 04/13/14  Yes Tatyana Kirichenko, PA-C  pantoprazole (PROTONIX) 20 MG tablet Take 1 tablet (20 mg total) by mouth daily. 02/24/16  Yes Ramonita Lab, MD  pregabalin (LYRICA) 75 MG capsule Take 75 mg by mouth 2 (two) times daily.   Yes Historical Provider, MD  tiotropium (SPIRIVA) 18 MCG inhalation capsule Place 18 mcg into inhaler and inhale daily.   Yes Historical Provider, MD  diltiazem (CARDIZEM CD) 240 MG 24 hr capsule Take 1 capsule (240 mg total) by mouth daily. 03/02/16 05/31/16  Ok Anis, NP    Review of Systems    A 12 system review of systems was performed and was negative, except as noted in the history of present illness.  Physical Exam    VS:  BP 140/62 (BP Location: Left Arm, Patient  Position: Sitting, Cuff Size: Normal)   Pulse 79   Ht 5\' 7"  (1.702 m)   Wt 151 lb (68.5 kg)   BMI 23.65 kg/m  , BMI Body mass index is 23.65 kg/m. GEN: Well-developed, well-nourished man seated comfortably in the exam room. He is accompanied by his wife and daughter. HEENT: No conjunctival pallor or scleral icterus. OP clear with dentures in place..  Neck: Supple without lymphadenopathy, thyromegaly, JVD, or HJR. Cardiac: Irregularly irregular, no murmurs, rubs, or gallops. No clubbing, cyanosis, edema.  Radials/DP/PT 1+ and equal bilaterally.  Respiratory:  Respirations regular and unlabored, clear to auscultation bilaterally. GI: Soft, nontender, nondistended, BS + x 4. MS: no deformity or atrophy. Skin: warm and dry, no rash. Neuro:  Strength and sensation are intact. Psych: Patient has blunted affect.  Accessory Clinical Findings    ECG - Coarse atrial  fibrillation (less likely flutter with variable can duction. Heart rate decreased from previous tracing. Otherwise, no significant change.  Assessment & Plan    1.  Persistent atrial flutter/fibrillation: Rate control is improved today, with patient remaining in atrial fibrillation. Given that he remains white fatigued, I am concerned that he does not tolerate atrial fibrillation, even when heart rate is adequately controlled. We have agreed to increase diltiazem to 300 mg daily to see if better rate control will improve his symptoms. We will also send initiate warfarin given his CHADSVASC score of at least 3. He is not a candidate for a NOAC given his chronic Dilantin therapy. We will start with warfarin 2.5 mg daily and have him follow-up in the anticoagulation clinic next week. If his fatigue persists, we will plan for cardioversion once his INR has been therapeutic for 4 weeks. The patient is not interested in TEE-guided cardioversion. Given the patient's reported apneic episodes, evaluation for sleep apnea should also be considered. I  will defer this to the patient's PCP.  2. Essential hypertension: Blood pressure is borderline elevated today. We will increase diltiazem, as above.  3. Seizure disorder: No recurrence of seizures since discharge. Continue Dilantin and follow-up with neurology  4.  Dispo:  Return to clinic in 6 weeks  Yvonne Kendallhristopher Loriel Diehl, MD 03/08/2016, 5:29 PM

## 2016-03-07 NOTE — Patient Instructions (Signed)
Medication Instructions:  Your physician has recommended you make the following change in your medication:  1- INCREASE Diltiazem to 300 mg by mouth once a day. 2- START Warfarin 2.5 mg  (1 tablet) by mouth once a day at 6pm.   Labwork: none  Testing/Procedures: none  Follow-Up: Your physician recommends that you schedule a follow-up appointment WITH [facility] ON Wednesday, March 14, 2016.  Your physician recommends that you schedule a follow-up appointment in: 6 WEEKS WITH DR END.   If you need a refill on your cardiac medications before your next appointment, please call your pharmacy.   What You Need to Know About Warfarin Warfarin is a blood thinner (anticoagulant). Anticoagulants help to prevent the formation of blood clots. They also help to stop the growth of blood clots. Who should use warfarin? Warfarin is prescribed for people who are at risk for developing harmful blood clots, such as people who have:  Surgically implanted mechanical heart valves.  Irregular heart rhythms (atrial fibrillation).  Certain clotting disorders.  A history of harmful blood clotting in the past. This includes people who have had:  A stroke.  Blood clot in the lungs (pulmonary embolism, or PE).  Blood clot in the legs (deep vein thrombosis, or DVT).  An existing blood clot. How is warfarin taken?   Warfarin is a medicine that you take by mouth (orally). Warfarin tablets come in different strengths. Each tablet strength is a different color, with the amount of warfarin printed on the tablet. If you get a new prescription filled and the color of your tablet is different than usual, tell your pharmacist or health care provider immediately. What blood tests do I need while taking warfarin? The goal of warfarin therapy is to lessen the clotting tendency of blood, but not to prevent clotting completely. Your health care provider will monitor the anticoagulation effect of warfarin  closely and will adjust your dose as needed. Warfarin is a medicine that needs to be closely monitored, so it is very important to keep all lab visits and follow-up visits with your health care provider. While taking warfarin, you will need to have blood tests (prothrombin tests, or PT tests) regularly to measure your blood clotting time. This type of test can be done with a finger stick or a blood draw. What does the INR test result mean?  The PT test results will be reported as the International Normalized Ratio (INR). The INR tells your health care provider whether your dosage of warfarin needs to be changed. The longer it takes your blood to clot, the higher the INR. Your health care provider will tell you your target INR range. If your INR is not in your target range, your health care provider may adjust your dosage.  If your INR is above your target range, there is a risk of bleeding. Your dosage of warfarin may need to be decreased.  If your INR is below your target range, there is a risk of clotting. Your dosage of warfarin may need to be increased. How often is the INR test needed?   When you first start warfarin, you will usually have your INR checked every few days.  You may need to have INR tests done more than once a week until you are taking the correct dosage of warfarin.  After you have reached your target INR, your INR will be tested less often. However, you will need to have your INR checked at least once every 4-6 weeks for  the entire time you are taking warfarin. What are the side effects of warfarin? Too much warfarin can cause bleeding (hemorrhage) in any part of the body, such as:  Bleeding from the gums.  Unexplained bruises.  Bruises that get larger.  Blood in the urine.  Bloody or dark stools.  Bleeding in the brain (hemorrhagic stroke).  A nosebleed that is not easily stopped.  Coughing up blood.  Vomiting blood. Warfarin use may also cause:  Skin rash  or irritations  Nausea that does not go away.  Severe pain in the back or joints.  Painful toes that turn blue or purple (purple toe syndrome).  Painful ulcers that do not go away (skin necrosis). What are the signs and symptoms of a blood clot? Too little warfarin can increase the risk of blood clots in your legs, lungs, or arms. Signs and symptoms of a DVT in your leg or arm may include:  Pain or swelling in your leg or arm.  Skin that is red or warm to the touch on your arm or leg. Signs and symptoms of a pulmonary embolism may include:  Shortness of breath or difficulty breathing.  Chest pain.  Unexplained fever. What are the signs and symptoms of a stroke? If you are taking too much or too little warfarin, you can have a stroke. Signs and symptoms of a stroke may include:  Weakness or numbness of your face, arm, or leg, especially on one side of your body.  Confusion or trouble thinking clearly.  Difficulty seeing with one or both eyes.  Difficulty walking or moving your arms or legs.  Dizziness.  Loss of balance or coordination.  Trouble speaking, trouble understanding speech, or both (aphasia).  Sudden, severe headache with no known cause.  Partial or total loss of consciousness. What precautions do I need to take while using warfarin?   Take warfarin exactly as told by your health care provider. Doing this helps you avoid bleeding or blood clots that could result in serious injury, pain, or disability.  Take your medicine at the same time every day. If you forget to take your dose of warfarin, take it as soon as you remember that day. If you do not remember on that day, do not take an extra dose the next day.  Contact your health care provider if you miss or take an extra dose. Do not change your dosage on your own to make up for missed or extra doses.  Wear or carry identification that says that you are taking warfarin.  Make sure that all health care  providers, including your dentist, know you are taking warfarin.  If you need surgery, talk with your health care provider about whether you should stop taking warfarin before your surgery.  Avoid situations that cause bleeding. You may bleed more easily while taking warfarin. To limit bleeding, take the following actions:  Use a softer toothbrush.  Floss with waxed floss, not unwaxed floss.  Shave with an electric razor, not with a blade.  Limit your use of sharp objects.  Avoid potentially harmful activities, such as contact sports. What do I need to know about warfarin and pregnancy or breastfeeding?  Warfarin is not recommended during the first trimester of pregnancy due to an increased risk of birth defects. In certain situations, a woman may take warfarin after her first trimester of pregnancy.  If you are taking warfarin and you become pregnant or plan to become pregnant, contact your health care provider  right away.  If you plan to breastfeed while taking warfarin, talk with your health care provider first. What do I need to know about warfarin and alcohol or drug use?  Avoid drinking alcohol, or limit alcohol intake to no more than 1 drink a day for nonpregnant women and 2 drinks a day for men. One drink equals 12 oz of beer, 5 oz of wine, or 1 oz of hard liquor.  If you change the amount of alcohol that you drink, tell your health care provider. Your warfarin dosage may need to be changed.    Avoid street drugs while taking warfarin. The effects of street drugs on warfarin are not known. What do I need to know about warfarin and other medicines or supplements?  Many prescription and over-the-counter medicines can interfere with warfarin. Talk with your health care provider or your pharmacist before starting or stopping any new medicines. This includes over-the-counter vitamins, dietary supplements, herbal medicines, and pain medicines. Your warfarin dosage may need to be  adjusted.  Some common over-the-counter medicines that may increase the risk of bleeding while taking warfarin include:  Acetaminophen.    NSAIDs, such as ibuprofen or naproxen.  Vitamin E. What do I need to know about warfarin and my diet?  It is important to maintain a normal, balanced diet while taking warfarin. Avoid major changes in your diet. If you are going to change your diet, talk with your health care provider before making changes.  Your health care provider may recommend that you work with a diet and nutrition specialist (dietitian).  Vitamin K decreases the effect of warfarin, and it is found in many foods. Eat a consistent amount of foods that contain vitamin K. For example, you may decide to eat 2 vitamin K-containing foods each day. Most foods that are high in vitamin K are green and leafy. Common foods that contain high amounts of vitamin K include:  Kale, raw or cooked.  Spinach, raw or cooked.  Collards, raw or cooked.  Swiss chard, raw or cooked.  Mustard greens, raw or cooked.  Turnip greens, raw or cooked.  Parsley, raw.  Broccoli, cooked.  Noodles, eggs, and spinach, enriched.  Brussels sprouts, raw or cooked.  Beet greens, raw or cooked.  Endive, raw.  Cabbage, cooked.  Asparagus, cooked. Foods that contain moderate amounts of vitamin K include:  Broccoli, raw.  Cabbage, raw.  Bok choy, cooked.  Green leaf lettuce, raw  Prunes, stewed.  Rosita Fire.  Kiwi.  Edamame, cooked.  Romaine lettuce, raw.  Avocado.  Tuna, canned in oil.  Okra, cooked.  Black-eyed peas, cooked.  Green beans, cooked or raw.  Blueberries, raw.  Blackberries, raw.  Peas, cooked or raw. Contact a health care provider if:  You miss a dose.  You take an extra dose.  You plan to have any kind of surgery or procedure.  You are unable to take your medicine due to nausea, vomiting, or diarrhea.  You have any major changes in your diet or you  plan to make any major changes in your diet.  You start or stop any over-the-counter medicine, prescription medicine, or dietary supplement.  You become pregnant, plan to become pregnant, or think you may be pregnant.  You have menstrual periods that are heavier than usual.  You have unusual bruising. Get help right away if:  You develop symptoms of an allergic reaction, such as:  Swelling of the lips, face, tongue, mouth, or throat.  Rash.  Itching.  Itchy, red, swollen areas of skin (hives).  Trouble breathing.  Chest tightness.  You have:  Signs or symptoms of a stroke.  Signs or symptoms of a blood clot.  A fall or have an accident, especially if you hit your head.  Blood in your urine. Your urine may look reddish, pinkish, or tea-colored.  Blood in your stool. Your stool may be black or bright red.  Bleeding that does not stop after applying pressure to the area for 30 minutes.  Severe pain in your joints or back.  Purple or blue toes.  Skin ulcers that do not go away.  You vomit blood or cough up blood. The blood may be bright red, or it may look like coffee grounds. These symptoms may represent a serious problem that is an emergency. Do not wait to see if the symptoms will go away. Get medical help right away. Call your local emergency services (911 in the [place] Do not drive yourself to the hospital. Summary  Warfarin needs to be closely monitored with blood tests. It is very important to keep all lab visits and follow-up visits with your health care provider.  Make sure that you know your target INR range and your warfarin dosage.  Wear or carry identification that says that you are taking warfarin.  Take warfarin at the same time every day. Call your health care provider if you miss a dose or if you take an extra dose. Do not change the dosage of warfarin on your own.  Know the signs and symptoms of blood clots, bleeding, and a stroke. Know when to  get emergency medical help.  Tell all health care providers who care for you that you are taking warfarin.  Talk with your health care provider or your pharmacist before starting or stopping any new medicines.  Monitor how much vitamin K you eat every day. Try to eat the same amount every day. This information is not intended to replace advice given to you by your health care provider. Make sure you discuss any questions you have with your health care provider. Document Released: 12/25/2004 Document Revised: 09/06/2015 Document Reviewed: 03/23/2015 Elsevier Interactive Patient Education  2017 [employer].

## 2016-03-08 ENCOUNTER — Telehealth: Payer: Self-pay | Admitting: Internal Medicine

## 2016-03-08 ENCOUNTER — Encounter: Payer: Self-pay | Admitting: Internal Medicine

## 2016-03-08 NOTE — Telephone Encounter (Signed)
Patient is a coumadin patient .  Home health nurse calling to see if these should be done at home or if patient is to come to office for checks.   Please call to discuss.

## 2016-03-08 NOTE — Telephone Encounter (Signed)
S/w Tashera with [facility]. Advised for patient to keep new coumadin appt this Wednesday and then from there we could address management of coumadin checks. Will route to Elwin SleightErika Bynum, RN with coumadin clinic for advice.

## 2016-03-09 NOTE — Telephone Encounter (Signed)
Called [facility] spoke with Cherlyn [facility]ashera[facility], [facility].  Fax # is [phone].  Faxed order to check INR on 03/12/16 and call results to Coumadin clinic, [facility] to [facility], and have them continue to check [facility]  Dr End pt started on Warfarin 2.5mg  tablets daily on 03/07/16.

## 2016-03-13 ENCOUNTER — Telehealth: Payer: Self-pay | Admitting: *Deleted

## 2016-03-13 NOTE — Telephone Encounter (Signed)
INR not obtained yesterday by home health, order given to obtain INR tomorrow and fax to us.

## 2016-03-14 ENCOUNTER — Telehealth: Payer: Self-pay | Admitting: Internal Medicine

## 2016-03-14 ENCOUNTER — Other Ambulatory Visit: Payer: Self-pay | Admitting: *Deleted

## 2016-03-14 ENCOUNTER — Ambulatory Visit (INDEPENDENT_AMBULATORY_CARE_PROVIDER_SITE_OTHER): Payer: Medicare HMO | Admitting: Interventional Cardiology

## 2016-03-14 DIAGNOSIS — Z5181 Encounter for therapeutic drug level monitoring: Secondary | ICD-10-CM

## 2016-03-14 DIAGNOSIS — I4891 Unspecified atrial fibrillation: Secondary | ICD-10-CM

## 2016-03-14 LAB — POCT INR: INR: 1.3

## 2016-03-14 MED ORDER — WARFARIN SODIUM 2.5 MG PO TABS: 2.5000 mg | ORAL_TABLET | ORAL | 1 refills | Status: DC

## 2016-03-14 NOTE — Telephone Encounter (Signed)
S/w patient. He clarified that his oxygen level was 91% on his pulse oximeter. He has hx COPD and this is his normal O2 level. He has O2 at home if he needs it.  Will route encounter to Coumadin clinic for management of INR.

## 2016-03-14 NOTE — Telephone Encounter (Signed)
Home Nurse called in INR 1.3 and PT 15.6  61% oxygen.   If any orders are needed please fax at [phone]

## 2016-03-14 NOTE — Telephone Encounter (Signed)
INR result addressed, see anticoagulation note in Epic.

## 2016-03-21 ENCOUNTER — Ambulatory Visit (INDEPENDENT_AMBULATORY_CARE_PROVIDER_SITE_OTHER): Payer: Medicare HMO | Admitting: Cardiology

## 2016-03-21 ENCOUNTER — Ambulatory Visit: Payer: Medicare HMO | Admitting: Internal Medicine

## 2016-03-21 DIAGNOSIS — Z5181 Encounter for therapeutic drug level monitoring: Secondary | ICD-10-CM

## 2016-03-21 DIAGNOSIS — I4891 Unspecified atrial fibrillation: Secondary | ICD-10-CM

## 2016-03-21 LAB — POCT INR: INR: 2.6

## 2016-03-26 ENCOUNTER — Telehealth: Payer: Self-pay | Admitting: Internal Medicine

## 2016-03-26 NOTE — Telephone Encounter (Signed)
Pt daughter calling stating pt is new to [facility] Last appt was about 3 weeks Every time home health nurse comes its usually around 100-120 Pt is still very tired. Can't walk about 10 feet without getting SOB 2 nights ago by just changing clothes he fell from being so tired. She states this is not normal for patient at all.  Would like some advise on this Please call back  Thinks the medication is not helping Would like to try something different.

## 2016-03-26 NOTE — Telephone Encounter (Signed)
Returned call to patient's daughter, ok per DPR. She is concerned with her father being so fatigued.  He has no energy and sits in his recliner all day because he is so tired and seems depressed. 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 As noted in previous entry of encounter, patient gets SOB with exertion. Patient's heart rate ranges in the 90's and sometimes increasing to 100-120 when checked periodically by Home health RN. Daughter would like to see if there's some other medication adjustments that can be made as her father is opposed to having cardioversion at this time (which was discussed at last OV on 03/07/16). Patient was on metoprolol until 03/02/16, when it was discontinued for patient being so fatigued and started on Diltiazem. Daughter says that the fatigue has not improved despite being off the metoprolol and thinks metoprolol may have helped more than the Diltiazem.  Patient due to f/u with Dr End on 04/18/16 for OV. Will route to Dr End for advice.

## 2016-03-27 NOTE — Telephone Encounter (Signed)
S/w patient's daughter, Agustin CreeDarlene, and confirmed appt for tomorrow 03/27/16 at 3pm with Dr End.

## 2016-03-27 NOTE — Telephone Encounter (Signed)
I spoke with pt's daughter, who notes that [name] remains very fatigued and weak. She is concerned about his HR, which continues to be 100-120 bpm when checked at home. We will add [name] on to my schedule for tomorrow in order to reassess his HR and medications and discuss potential for DCCV in the future.  Yvonne Kendallhristopher Chole Driver, MD [facility] Pager: [phone]

## 2016-03-28 ENCOUNTER — Telehealth: Payer: Self-pay | Admitting: Internal Medicine

## 2016-03-28 ENCOUNTER — Other Ambulatory Visit: Payer: Self-pay | Admitting: Internal Medicine

## 2016-03-28 ENCOUNTER — Ambulatory Visit (INDEPENDENT_AMBULATORY_CARE_PROVIDER_SITE_OTHER): Payer: Medicare HMO

## 2016-03-28 ENCOUNTER — Other Ambulatory Visit
Admission: RE | Admit: 2016-03-28 | Discharge: 2016-03-28 | Disposition: A | Discharge status: Home / Self Care | Payer: Medicare HMO | Source: Ambulatory Visit | Attending: Internal Medicine | Admitting: Internal Medicine

## 2016-03-28 ENCOUNTER — Ambulatory Visit (INDEPENDENT_AMBULATORY_CARE_PROVIDER_SITE_OTHER): Payer: Medicare HMO | Admitting: Internal Medicine

## 2016-03-28 ENCOUNTER — Encounter: Payer: Self-pay | Admitting: Internal Medicine

## 2016-03-28 VITALS — BP 140/60 | HR 115 | Ht 67.0 in | Wt 148.5 lb

## 2016-03-28 DIAGNOSIS — R262 Difficulty in walking, not elsewhere classified: Secondary | ICD-10-CM | POA: Insufficient documentation

## 2016-03-28 DIAGNOSIS — R531 Weakness: Secondary | ICD-10-CM

## 2016-03-28 DIAGNOSIS — G40909 Epilepsy, unspecified, not intractable, without status epilepticus: Secondary | ICD-10-CM | POA: Diagnosis present

## 2016-03-28 DIAGNOSIS — I481 Persistent atrial fibrillation: Secondary | ICD-10-CM | POA: Diagnosis not present

## 2016-03-28 DIAGNOSIS — R55 Syncope and collapse: Secondary | ICD-10-CM

## 2016-03-28 DIAGNOSIS — I10 Essential (primary) hypertension: Secondary | ICD-10-CM | POA: Diagnosis not present

## 2016-03-28 DIAGNOSIS — I4819 Other persistent atrial fibrillation: Secondary | ICD-10-CM

## 2016-03-28 DIAGNOSIS — Z5181 Encounter for therapeutic drug level monitoring: Secondary | ICD-10-CM

## 2016-03-28 DIAGNOSIS — I4891 Unspecified atrial fibrillation: Secondary | ICD-10-CM

## 2016-03-28 LAB — COMPREHENSIVE METABOLIC PANEL
ALBUMIN: 4.3 g/dL (ref 3.5–5.0)
ALT: 14 U/L — ABNORMAL LOW (ref 17–63)
AST: 22 U/L (ref 15–41)
Alkaline Phosphatase: 140 U/L — ABNORMAL HIGH (ref 38–126)
Anion gap: 9 (ref 5–15)
BILIRUBIN TOTAL: 0.6 mg/dL (ref 0.3–1.2)
BUN: 19 mg/dL (ref 6–20)
CALCIUM: 9.2 mg/dL (ref 8.9–10.3)
CHLORIDE: 98 mmol/L — AB (ref 101–111)
CO2: 30 mmol/L (ref 22–32)
Creatinine, Ser: 1.2 mg/dL (ref 0.61–1.24)
GFR calc Af Amer: >60 mL/min (ref >60)
GFR calc non Af Amer: 54 mL/min — ABNORMAL LOW (ref >60)
Glucose, Bld: 100 mg/dL — ABNORMAL HIGH (ref 65–99)
POTASSIUM: 3.2 mmol/L — AB (ref 3.5–5.1)
Sodium: 137 mmol/L (ref 135–145)
TOTAL PROTEIN: 8.5 g/dL — AB (ref 6.5–8.1)

## 2016-03-28 LAB — CBC WITH DIFFERENTIAL/PLATELET
BASOS ABS: 0.1 10*3/uL (ref 0–0.1)
BASOS PCT: 1 %
Eosinophils Absolute: 0.1 10*3/uL (ref 0–0.7)
Eosinophils Relative: 2 %
HEMATOCRIT: 42.4 % (ref 40.0–52.0)
Hemoglobin: 14.6 g/dL (ref 13.0–18.0)
Lymphocytes Relative: 18 %
Lymphs Abs: 1.4 10*3/uL (ref 1.0–3.6)
MCH: 31.5 pg (ref 26.0–34.0)
MCHC: 34.4 g/dL (ref 32.0–36.0)
MCV: 91.5 fL (ref 80.0–100.0)
MONO ABS: 0.7 10*3/uL (ref 0.2–1.0)
Monocytes Relative: 9 %
NEUTROS ABS: 5.1 10*3/uL (ref 1.4–6.5)
Neutrophils Relative %: 70 %
PLATELETS: 278 10*3/uL (ref 150–440)
RBC: 4.63 MIL/uL (ref 4.40–5.90)
RDW: 14.4 % (ref 11.5–14.5)
WBC: 7.4 10*3/uL (ref 3.8–10.6)

## 2016-03-28 LAB — POCT INR: INR: 3.7

## 2016-03-28 LAB — PHENYTOIN LEVEL, TOTAL: PHENYTOIN LVL: 22.1 ug/mL — AB (ref 10.0–20.0)

## 2016-03-28 LAB — MAGNESIUM: MAGNESIUM: 2.1 mg/dL (ref 1.7–2.4)

## 2016-03-28 MED ORDER — POTASSIUM CHLORIDE ER 20 MEQ PO TBCR: EXTENDED_RELEASE_TABLET | ORAL | 0 refills | Status: DC

## 2016-03-28 MED ORDER — BISOPROLOL FUMARATE 5 MG PO TABS: 2.5000 mg | ORAL_TABLET | Freq: Every day | ORAL | 3 refills | Status: DC

## 2016-03-28 NOTE — Progress Notes (Signed)
Office Visit    Patient Name: Zachary RavelSamuel D Peterson Date of Encounter: 03/28/2016  Primary Care Provider:  Ignacia PalmaBECK,MARK C., MD Primary Cardiologist:  Yvonne Kendallhristopher Jamonte Curfman, MD   Chief Complaint    83 y.o. male with a prior history of seizure disorder, hypertension, hyperlipidemia, and paroxysmal atrial fibrillation, who presents for f/u of persistent atrial fibrillation.  Past Medical History    Past Medical History:  Diagnosis Date  . Back pain   . COPD (chronic obstructive pulmonary disease) (HCC)   . Depression   . Hearing loss   . History of GI bleed 1976  . Hyperlipemia   . Hypertension   . Migraine   . PAF (paroxysmal atrial fibrillation) (HCC)    a. 02/2016 Echo: EF 66-65%, mild MR;  b. CHA2DS2VASc = 3. No OAC 2/2 h/o seizures and falls-->therefor initial attempt @ rate control (02/2016).  . Palpitations   . Seizure (HCC)   . Syncope    a. related to seizure disorder   Past Surgical History:  Procedure Laterality Date  . APPENDECTOMY    . BACK SURGERY    . CATARACT EXTRACTION    . hemmoridectomy    . HERNIA REPAIR      Allergies  Allergies  Allergen Reactions  . Aspirin Other (See Comments)    CAUSES ULCERS  . Codeine Nausea And Vomiting  . Oxycodone Nausea And Vomiting    SEIZURES    History of Present Illness    83 y.o. male with the above complex past medical history including seizure disorder, hypertension, hyperlipidemia, COPD, peptic ulcer disease with remote GI bleed, syncope, and paroxysmal atrial fibrillation. He says that he was first diagnosed with an irregular heartbeat age 83. He was initially managed with quinidine. He says later in life, he wore a monitor and had extra beats up to about 10 times per minute. This sounds more like he was having either PACs or PVCs. He was never on oral anticoagulation.  He was recently admitted to [facility] last month with watery diarrhea followed by seizure on the day of admission. In the emergency department,  he was noted to be in atrial fibrillation. Troponin was mildly elevated. He was initially placed on amiodarone, but this was discontinued as it was unclear how long he had been in atrial fibrillation. Rate was managed with diltiazem and metoprolol. CHA2DS2VASc is 3 however, he was not felt to be a good candidate for anticoagulation secondary to falls, seizures, and prior history of GI bleed in 1976. Echocardiogram was performed and showed normal LV function with mild mitral regurgitation. His stool tested positive for norovirus. Following recovery, he was discharged home on oral metoprolol and diltiazem. The patient noted considerable fatigue after discharge, prompting discontinuation of metoprolol.  I saw Zachary Peterson for f/u about three weeks ago, at which time he remained fatigued and was concerned about suboptimal heart rate control with diltiazem alone. We agreed to increase diltiazem and add warfarin, with the plan of pursuing DCCV after 1 month of therapeutic anticoagulation. Unfortunately, Zachary Peterson has remained very fatigued. He has no energy and feels as though his legs are going to give out on him. He also falls asleep several times during the day. He notes that his heart rate remains elevated, usually 100-120 bpm. He also lost his balance and fell recently while closing his eyes. He scraped his arm but did not have any significant injuries. He bruises easily but denies significant bleeding.  Zachary Peterson denies palpitations, shortness  of breath, and edema. He noted a dull lower chest pain upon awakening this morning. It resolved spontaneously after a few minutes and has otherwise not been present.  Home Medications    Prior to Admission medications   Medication Sig Start Date Javelle Donigan Date Taking? Authorizing Provider  aspirin EC 81 MG EC tablet Take 1 tablet (81 mg total) by mouth daily. 02/25/16  Yes Ramonita Lab, MD  calcium carbonate (TUMS - DOSED IN MG ELEMENTAL CALCIUM) 500 MG chewable tablet Chew 1  tablet (200 mg of elemental calcium total) by mouth 3 (three) times daily with meals as needed for indigestion or heartburn. 02/24/16  Yes Ramonita Lab, MD  diazepam (VALIUM) 5 MG tablet Take 5 mg by mouth 2 (two) times daily. ANXIETY   Yes Historical Provider, MD  DILANTIN 100 MG ER capsule Take 100 mg by mouth 3 (three) times daily. 04/12/14  Yes Historical Provider, MD  ezetimibe-simvastatin (VYTORIN) 10-80 MG per tablet Take 1 tablet by mouth every evening.    Yes Historical Provider, MD  fluticasone (FLONASE) 50 MCG/ACT nasal spray Place 1 spray into both nostrils daily.   Yes Historical Provider, MD  hydrochlorothiazide (HYDRODIURIL) 25 MG tablet Take 25 mg by mouth daily.   Yes Historical Provider, MD  HYDROcodone-acetaminophen (NORCO) 5-325 MG per tablet Take 1 tablet by mouth every 6 (six) hours as needed for moderate pain. 04/13/14  Yes Tatyana Kirichenko, PA-C  hydroxypropyl methylcellulose / hypromellose (ISOPTO TEARS / GONIOVISC) 2.5 % ophthalmic solution Place 1 drop into both eyes 3 (three) times daily as needed for dry eyes.   Yes Historical Provider, MD  ibuprofen (ADVIL,MOTRIN) 200 MG tablet Take 200 mg by mouth every 6 (six) hours as needed for headache or moderate pain.    Yes Historical Provider, MD  mirtazapine (REMERON) 15 MG tablet Take 15 mg by mouth at bedtime.   Yes Historical Provider, MD  ondansetron (ZOFRAN ODT) 8 MG disintegrating tablet Take 1 tablet (8 mg total) by mouth every 8 (eight) hours as needed for nausea or vomiting. 04/13/14  Yes Tatyana Kirichenko, PA-C  pantoprazole (PROTONIX) 20 MG tablet Take 1 tablet (20 mg total) by mouth daily. 02/24/16  Yes Ramonita Lab, MD  pregabalin (LYRICA) 75 MG capsule Take 75 mg by mouth 2 (two) times daily.   Yes Historical Provider, MD  tiotropium (SPIRIVA) 18 MCG inhalation capsule Place 18 mcg into inhaler and inhale daily.   Yes Historical Provider, MD  diltiazem (CARDIZEM CD) 240 MG 24 hr capsule Take 1 capsule (240 mg total) by  mouth daily. 03/02/16 05/31/16  Ok Anis, NP    Review of Systems    A 12 system review of systems was performed and was negative, except as noted in the history of present illness.  Physical Exam    VS:  BP 140/60 (BP Location: Left Arm, Patient Position: Sitting, Cuff Size: Normal)   Pulse (!) 115   Ht 5\' 7"  (1.702 m)   Wt 148 lb 8 oz (67.4 kg)   BMI 23.26 kg/m  , BMI Body mass index is 23.26 kg/m. GEN: Well-developed, well-nourished man seated comfortably in the exam room. He is accompanied by his daughter. HEENT: No conjunctival pallor or scleral icterus. OP clear with dentures in place..  Neck: Supple without lymphadenopathy, thyromegaly, JVD, or HJR. Cardiac: Irregularly irregular and tachycardic, no murmurs, rubs, or gallops. No clubbing, cyanosis, edema.  Radials/DP/PT 1+ and equal bilaterally.  Respiratory:  Respirations regular and unlabored, clear to auscultation bilaterally.  GI: Soft, nontender, nondistended, BS + x 4. MS: no deformity or atrophy. Skin: warm and dry, no rash. Neuro:  4/5 hip flexion and knee extension bilaterally. Otherwise, 5/5 upper and lower extremity strength. CN III-XII intact. No significant nystagmus. Psych: Patient has blunted affect.  Accessory Clinical Findings    ECG - Coarse atrial fibrillation (less likely flutter) with ventricular rate of 115 bpm. Ventricular rate has increased since prior tracing on 03/07/16 (I have personally reviewed both tracings).  Assessment & Plan    1.  Persistent atrial flutter/fibrillation: Rate control is suboptimal today. Given that his fatigue did not improve at all with discontinuation of metoprolol, we have agreed to add a beta-blocker to improve HR control. We will start bisoprolol 2.5 mg daily; this can be doubled to 5 mg daily if his heart rate remains consistent > 100 bpm after 2 days. He should remain on warfarin, with plan to pursue DCCV once his INR has been therapeutic for 4 weeks.  2.  Essential hypertension: Blood pressure is borderline elevated today. As above, we will add bisoprolol 2.5 mg daily  3. Seizure disorder: No recurrence of seizures since discharge. We will check a phenytoin level today, as many of his symptoms could be explained by phenytoin toxicity.  4. Fatigue: This is likely multifactorial and could be related to atrial fibrillation. We will attempt better rate control as above. I am concerned that some other process may be contributing, including medication side effects. We will check a phenytoin level today (patient did not take his morning dose). Remeron and Lyrica could also be contributing. We will check a CMP and CBC as well to evaluate for other potential causes of his weakness. Though the patient had an episode of brief chest pain in bed this morning, he otherwise denies chest pain and shortness of breath. I therefore believe that obstructive CAD is less likely to be the cause of his symptoms. Underlying myopathic or neurologic condition will also need to be obtained if his symptoms do not improve with adequate HR control and ultimate restoration of sinus rhythm. We will also place a referral for home health PT evaluation.  5.  Follow-up:  Return to clinic as previously scheduled on 04/18/16.  Yvonne Kendall, MD 03/28/2016, 4:01 PM

## 2016-03-28 NOTE — Patient Instructions (Signed)
Medication Instructions:  Your physician has recommended you make the following change in your medication:  1- START Bisoprolol 2.5 mg (0.5 tablet) by mouth once a day.   Labwork: Your physician recommends that you return for lab work in: TODAY (CMP, CBC, Dilantin level, Magnesium). - [facility]. You will check in at the front desk to the right as you walk into the atrium. Valet Parking is offered if needed.    Testing/Procedures: none  Follow-Up: Your physician recommends that you schedule a follow-up appointment in: AS SCHEDULED WITH DR END.  You have been referred to HOME HEALTH PHYSICAL THERAPY EVALUATION.  Someone from home health will call you to schedule.   If you need a refill on your cardiac medications before your next appointment, please call your pharmacy.

## 2016-03-28 NOTE — Telephone Encounter (Signed)
I spoke with the patient's daughter, Agustin Cree, regarding results of today's labs. Notable findings included phenytoin level of 22.1, K 3.2, and creatinine of 1.2 (previously 0.9). Given Mr. Kerwin recent fatigue, lethargy, and imbalance, I recommend holding phenytoin and contacting the patient's neurologist tomorrow for further instructions. We will start potassium supplementation with KCl 40 mEq x 1, followed by 20 mEq daily. I will have his home health RN recheck a BMP next week with his INR check.  Yvonne Kendall, MD [facility] Pager: [phone]

## 2016-03-29 ENCOUNTER — Telehealth: Payer: Self-pay | Admitting: Internal Medicine

## 2016-03-29 NOTE — Addendum Note (Signed)
Addended by: Stann MainlandLARK, Antionetta Ator O on: 03/29/2016 04:14 PM   Modules accepted: Orders

## 2016-03-29 NOTE — Telephone Encounter (Signed)
Spoke with Cherlyn Robertsashera. She took verbal orders for BMP next Wednesday, 04/04/16, at the same time as PT/INR check as well as Home Health PT eval for weakness and falls. Cherlyn Robertsashera asked to have signed order from Dr End faxed to [phone]. Advised Dr End not in office until next Tuesday and will have him sign orders then and fax.  She stated that would be fine.

## 2016-03-29 NOTE — Telephone Encounter (Signed)
Notified patient's daughter, Agustin CreeDarlene, that [facility] and that we will fax order once signed by Dr End next time he is in the office.

## 2016-03-29 NOTE — Telephone Encounter (Signed)
Returned call to [employer] Wh[employer].  Advised for her to reach out to patient neurologist Dr Royston SinnerJoseph Miller for instructions on dilantin management. She verbalized understanding and will call there. She also confirmed [facility] and BMET with PT/INR next Wednesday.  I will fax signed order after Dr End signs it in the office next week.

## 2016-03-29 NOTE — Telephone Encounter (Signed)
Zachary Peterson calling from home health calling stating she is with patient at the moment and pt family is asking if she can draw a dilantin lab on patient  Seems it was low last time.   She is to come back  Wednesday to draw a INR and bmet   Please advise

## 2016-04-02 ENCOUNTER — Telehealth: Payer: Self-pay | Admitting: Internal Medicine

## 2016-04-02 MED ORDER — BISOPROLOL FUMARATE 5 MG PO TABS: 2.5000 mg | ORAL_TABLET | Freq: Two times a day (BID) | ORAL | 3 refills | Status: DC

## 2016-04-02 NOTE — Telephone Encounter (Signed)
Returned call to patient's grandaughter, advised her that she will need to be added to [facility] for discussion of [facility] care.  She was understanding. We reviewed the information in previous entry concerning patient's heart rate. She added that once he takes his morning dose of bisprolol 2.5mg  once a day, his heart rate comes back down to the 70's to 80's.  Patient's family wants to know if they should increase the bisoprolol to 5 mg daily as mentioned at last office visit or is it ok for heart rate to be 110 in the morning before taking his medication.  Routing to Dr End for advice.

## 2016-04-02 NOTE — Telephone Encounter (Signed)
Returned call to patient's daughter. She verbalized understanding. Rx changed on patient's profile. Will wait to send in refill at next office visit.

## 2016-04-02 NOTE — Telephone Encounter (Signed)
Zachary Peterson, pt grandaughter calling on behalf pt pt daughter We placed pt on a new betablocker for HR  We mentioned to them we can double it if it is high  during the day it stays around 80  As he wakes up it is usually around the 110's  Pt daughter is a bit hesitant to double it for only morning it is that high  It typically lowers during the day  Would like to know what is too high and what is to low for the patient Please advise.

## 2016-04-02 NOTE — Telephone Encounter (Signed)
Given that heart rate elevation is typically first thing in the morning (presumably before morning dose of bisoprolol), I think it would be reasonable to take bisoprolol 2.5 mg BID rather than 5 mg once daily. Hopefully, this will provide a more consistent heart rate response. Please let me know if any other questions or concerns arise.  Yvonne Kendallhristopher Rayane Gallardo, MD [facility] Pager: [phone]

## 2016-04-03 NOTE — Telephone Encounter (Signed)
Order for HHPT signed by Dr End and faxed to [facility].

## 2016-04-05 ENCOUNTER — Telehealth: Payer: Self-pay | Admitting: *Deleted

## 2016-04-05 LAB — PROTIME-INR: INR: 1.8 — AB (ref 0.9–1.1)

## 2016-04-05 NOTE — Telephone Encounter (Signed)
Zachary Peterson, [facility] called to let us know that labs would be drawn today and faxed/called to our office. Order is in place this was just FYI call.

## 2016-04-05 NOTE — Telephone Encounter (Signed)
Have not received lab work at this time. Waiting on BMP and pt/INR results. Will follow up.

## 2016-04-05 NOTE — Telephone Encounter (Signed)
Called [facility] to get results of labwork from yesterday. No answer and left a message to call me back.

## 2016-04-05 NOTE — Telephone Encounter (Signed)
S/w Zachary Peterson, clinician at [facility]. Patient's lab were not drawn yesterday but were drawn first thing this morning as STAT. Gave her our fax and office number. She will call or fax the results as soon as she gets them back.

## 2016-04-05 NOTE — Telephone Encounter (Signed)
Called [facility]. They have patient's lab work and will fax to office shortly.

## 2016-04-05 NOTE — Telephone Encounter (Signed)
[facility] who has been working with patient. No answer. Left message to call back to get the results of lab work from yesterday.

## 2016-04-06 ENCOUNTER — Telehealth: Payer: Self-pay | Admitting: Internal Medicine

## 2016-04-06 ENCOUNTER — Ambulatory Visit (INDEPENDENT_AMBULATORY_CARE_PROVIDER_SITE_OTHER): Payer: Medicare HMO | Admitting: Internal Medicine

## 2016-04-06 DIAGNOSIS — Z5181 Encounter for therapeutic drug level monitoring: Secondary | ICD-10-CM

## 2016-04-06 DIAGNOSIS — I4891 Unspecified atrial fibrillation: Secondary | ICD-10-CM

## 2016-04-06 NOTE — Telephone Encounter (Signed)
Pt daughter called, regarding coumadin doseage. Please call. Pt daughter also asks about home PT assessment. She has not heard from them. please call and advise

## 2016-04-06 NOTE — Telephone Encounter (Signed)
Spoke w/ [name].  She reports that home health drew labs yesterday and they have received results from neurology, but have not heard back about BMET.  She reports that she received a call about INR results this am, but was not given any instructions on dosing, as pt's INR was low. Advised her that the recommendations she was given were to take an extra 1/2 pill today which will bring his INR back to normal range.  She is appreciative of the recommendation. Advised her that we have not received results back on pt's BMET yet. She asks that [place] call her back on Monday to discuss PT referral, as she has not heard anything about this referral. Advised her that I will call [facility] and see if I can get labs so we can scan them in for review. Advised her that Dr. Okey Dupre is not in the office today, but we will call her w/ his recommendation after he reviews them.  Called [facility] @ [phone] and spoke to [place]. She reports that results of BMET are back and she will fax them now.

## 2016-04-06 NOTE — Telephone Encounter (Signed)
Telephoned daughter back and gave her the dose instructions for Coumadin. She states she had went to her father home and he could not remember exactly what dosage I gave him this AM. I did inform her that pt stated he did miss a pill on Monday and pt had informed her of the same. Thus, she took him an extra pill box today for his medications.

## 2016-04-09 ENCOUNTER — Telehealth: Payer: Self-pay | Admitting: Internal Medicine

## 2016-04-09 ENCOUNTER — Telehealth: Payer: Self-pay | Admitting: *Deleted

## 2016-04-09 NOTE — Telephone Encounter (Signed)
Follow up with Zachary Peterson at [facility].  She did receive the home health PT order this morning. We have not received the BMP results yet. She states she has been trying all morning and continues to get a busy signal. She tried again while on the phone with me. Our fax machine is working and receiving faxes as out fax went through to [facility]. Zachary Peterson will overnight the results via mail. She shared the following values from BMP from 04/05/16 that Potassium 4.6, BUN 14, Creatinine 1.32. She will also keep trying to fax the results to [place].

## 2016-04-09 NOTE — Telephone Encounter (Signed)
Have not received BMP from 04/05/16 from [facility]. S/w Toni Amend at [facility] who said she will fax the results over today. They did not receive HHPT order last week. [place]iel they were having fax issues. HHPT order refaxed today. Will follow up later to make sure they received it.

## 2016-04-09 NOTE — Telephone Encounter (Signed)
[name] WOULD LIKE TO GET RECENT LAB RESULTS. PLEASE CALL.

## 2016-04-09 NOTE — Telephone Encounter (Signed)
Returned call to [employer]. Let her know we are still in the process of obtaining the lab results from [facility]. She stated the neurologists office was having trouble faxing to [facility] last week as well.  According to daughter, patient's dilantin level has returned to normal.  Advised that [facility] is overnighting the results to [place] and we will be in touch once Dr End reviews if needed. She was very Adult nurse.

## 2016-04-10 NOTE — Telephone Encounter (Signed)
Thank you for forwarding these results. [name]'s potassium has normalized. His creatinine has increased slightly but remains at the upper [place] of his baseline (1.0-1.3). Please have him decrease potassium chloride to 20 mEq every other day. I encourage him to increase his water intake. I will defer management of his INR and Dilantin to the anticoagulation clinic and his neurologist, respectively.

## 2016-04-10 NOTE — Telephone Encounter (Signed)
S/w Courtney to let her know we still did not receive BMP with our mail today. Toni Amend says she's still been trying to fax all day and the fax will not go through. Transferred to Trish, RN the Development worker, international aid who gave me the results over the phone and read back and verified as follows:  Glucose 63 BUN 19 Creatinine 1.32 Non-African GFR 50 African GFR 58 Sodium 138 Potassium 4.6 Chloride 100 Carbon dioxide 29 Calcium 8.9  Dilantin 13 INR 1.5 PT 18.5  Will forward to Dr End for review and continue to look for results to be received.

## 2016-04-11 ENCOUNTER — Telehealth: Payer: Self-pay | Admitting: Internal Medicine

## 2016-04-11 NOTE — Telephone Encounter (Signed)
Pt wife is returning your call.  

## 2016-04-11 NOTE — Telephone Encounter (Signed)
No answer. Left message to call back on daughter, [name]'s,  phone number.  Called patient and spoke with him and his wife. He verbalized understanding to take Potassium 20 meq by mouth once a day and to increase fluid intake.

## 2016-04-11 NOTE — Telephone Encounter (Signed)
Returned call to [employer], ok per DPR. Gave her results of recent lab work and medication instructions as well. She was glad I had talked with patient about increasing fluid intake as well.

## 2016-04-12 ENCOUNTER — Ambulatory Visit (INDEPENDENT_AMBULATORY_CARE_PROVIDER_SITE_OTHER): Payer: Medicare HMO | Admitting: *Deleted

## 2016-04-12 ENCOUNTER — Telehealth: Payer: Self-pay | Admitting: Internal Medicine

## 2016-04-12 DIAGNOSIS — Z5181 Encounter for therapeutic drug level monitoring: Secondary | ICD-10-CM

## 2016-04-12 DIAGNOSIS — I4891 Unspecified atrial fibrillation: Secondary | ICD-10-CM

## 2016-04-12 LAB — POCT INR: INR: 2.2

## 2016-04-12 NOTE — Telephone Encounter (Signed)
Marrian Salvage home health nurse calling stating pt HR is dropping It was about 126 and now is 106 It was about 7 minutes in between those times Right now its 104-102 Would like to know what needs to be done He is sitting down Please advise

## 2016-04-12 NOTE — Telephone Encounter (Signed)
Tashiera with [facility] reports pt's HR 126 then 8 minutes later it was 106; now 102. She states she has orders to contact us if HR fluctuates.  Pt is alert and oriented and asymptomatic. He has taken BP medications. Pt has PAF, taking bisoprolol, cardizem, HCTZ, and coumadin.  Informed Tashiera that with afib, pt's HR can fluctuate. Pt is able to check HR w/pulse ox after home health leaves today. He will monitor HR and call if it is unstable or does not decrease with bisoprolol. Rozanna Boer will relay information to patient and is agreeable with plan.

## 2016-04-18 ENCOUNTER — Ambulatory Visit (INDEPENDENT_AMBULATORY_CARE_PROVIDER_SITE_OTHER): Payer: Medicare HMO

## 2016-04-18 ENCOUNTER — Ambulatory Visit (INDEPENDENT_AMBULATORY_CARE_PROVIDER_SITE_OTHER): Payer: Medicare HMO | Admitting: Internal Medicine

## 2016-04-18 ENCOUNTER — Encounter: Payer: Self-pay | Admitting: Internal Medicine

## 2016-04-18 VITALS — BP 120/62 | HR 97 | Ht 67.0 in | Wt 151.5 lb

## 2016-04-18 DIAGNOSIS — I4891 Unspecified atrial fibrillation: Secondary | ICD-10-CM | POA: Diagnosis not present

## 2016-04-18 DIAGNOSIS — I10 Essential (primary) hypertension: Secondary | ICD-10-CM | POA: Diagnosis not present

## 2016-04-18 DIAGNOSIS — E876 Hypokalemia: Secondary | ICD-10-CM | POA: Diagnosis not present

## 2016-04-18 DIAGNOSIS — M791 Myalgia, unspecified site: Secondary | ICD-10-CM

## 2016-04-18 DIAGNOSIS — I4819 Other persistent atrial fibrillation: Secondary | ICD-10-CM

## 2016-04-18 DIAGNOSIS — Z5181 Encounter for therapeutic drug level monitoring: Secondary | ICD-10-CM | POA: Diagnosis not present

## 2016-04-18 DIAGNOSIS — R569 Unspecified convulsions: Secondary | ICD-10-CM

## 2016-04-18 DIAGNOSIS — I481 Persistent atrial fibrillation: Secondary | ICD-10-CM | POA: Diagnosis not present

## 2016-04-18 LAB — POCT INR: INR: 2.3

## 2016-04-18 NOTE — Patient Instructions (Signed)
Medication Instructions:  Your physician recommends that you continue on your current medications as directed. Please refer to the Current Medication list given to you today.   Labwork: Your physician recommends that you return for lab work in: TODAY (BMP, CK).   Testing/Procedures: none  Follow-Up: Your physician recommends that you schedule a follow-up appointment in: 4 WEEKS WITH DR END.   If you need a refill on your cardiac medications before your next appointment, please call your pharmacy.

## 2016-04-18 NOTE — Progress Notes (Signed)
Office Visit    Patient Name: Zachary Peterson Date of Encounter: 03/07/16  Primary Care Provider:  Ignacia Palma., MD Primary Cardiologist:  Wallace Cullens, MD   Chief Complaint    83 y.o. man with a prior history of seizure disorder, hypertension, hyperlipidemia, and paroxysmal atrial fibrillation, who was recently admitted for diarrhea, norovirus, seizure, and rapid afib, who presents for f/u.  Past Medical History    Past Medical History:  Diagnosis Date  . Back pain   . COPD (chronic obstructive pulmonary disease) (HCC)   . Depression   . Hearing loss   . History of GI bleed 1976  . Hyperlipemia   . Hypertension   . Migraine   . PAF (paroxysmal atrial fibrillation) (HCC)    a. 02/2016 Echo: EF 66-65%, mild MR;  b. CHA2DS2VASc = 3. No OAC 2/2 h/o seizures and falls-->therefor initial attempt @ rate control (02/2016).  . Palpitations   . Seizure (HCC)   . Syncope    a. related to seizure disorder   Past Surgical History:  Procedure Laterality Date  . APPENDECTOMY    . BACK SURGERY    . CATARACT EXTRACTION    . hemmoridectomy    . HERNIA REPAIR      Allergies  Allergies  Allergen Reactions  . Aspirin Other (See Comments)    CAUSES ULCERS  . Codeine Nausea And Vomiting  . Oxycodone Nausea And Vomiting    SEIZURES    History of Present Illness    83 y.o. man with the above complex past medical history including seizure disorder, hypertension, hyperlipidemia, COPD, peptic ulcer disease with remote GI bleed, syncope, and paroxysmal atrial fibrillation. He says that he was first diagnosed with an irregular heartbeat age 89. He was initially managed with quinidine. He says later in life, he wore a monitor and had extra beats up to about 10 times per minute. This sounds more like he was having either PACs or PVCs. He was never on oral anticoagulation.  He was admitted to [facility] in 02/2016 with watery diarrhea followed by seizure on the day of admission. In the  emergency department, he was noted to be in atrial fibrillation. Troponin was mildly elevated. He was initially placed on amiodarone, but this was discontinued as it was unclear how long he had been in atrial fibrillation. Rate was managed with diltiazem and metoprolol. CHA2DS2VASc is 3 however, he was not felt to be a good candidate for anticoagulation secondary to falls, seizures, and prior history of GI bleed in 1976. Echocardiogram was performed and showed normal LV function with mild mitral regurgitation. His stool tested positive for norovirus. Following recovery, he was discharged home on oral metoprolol and diltiazem. I last saw him on 03/28/16, at which time he remained very fatigued with suboptimal HR control. We checked multiple labs, which were notable for an elevated phenytoin level. We also added bisoprolol to diltiazem for improved heart rate control.  Today, Zachary Peterson and his family reports some improvement since our last visit. Specifically, his energy and balance have gotten better, which they attributed to decreased Dilantin dose and resolution of phenytoin toxicity. His heart rate has also been better controlled at home, though it continues to fluctuate throughout the day. He is now taking bisoprolol 2.5 mg twice a day for more consistent rate control throughout the day. The patient's biggest complaint today is of aching in the proximal thighs. He believes this may be due to increased activity since our  last visit. He has not had any chest pain, shortness of breath, palpitations, or lightheadedness. He also denies edema. He has had a couple episodes of left abdominal and flank pain that he describes as feeling like he was punched. The pain lasts for a few seconds before resolving spontaneously. He has not had any nausea, vomiting, diarrhea, or bright red blood per rectum/melena. He remains on warfarin, with his most recent INR therapeutic.  Home Medications    Prior to Admission medications    Medication Sig Start Date Crista Nuon Date Taking? Authorizing Provider  aspirin EC 81 MG EC tablet Take 1 tablet (81 mg total) by mouth daily. 02/25/16  Yes Ramonita Lab, MD  calcium carbonate (TUMS - DOSED IN MG ELEMENTAL CALCIUM) 500 MG chewable tablet Chew 1 tablet (200 mg of elemental calcium total) by mouth 3 (three) times daily with meals as needed for indigestion or heartburn. 02/24/16  Yes Ramonita Lab, MD  diazepam (VALIUM) 5 MG tablet Take 5 mg by mouth 2 (two) times daily. ANXIETY   Yes Historical Provider, MD  DILANTIN 100 MG ER capsule Take 100 mg by mouth 3 (three) times daily. 04/12/14  Yes Historical Provider, MD  ezetimibe-simvastatin (VYTORIN) 10-80 MG per tablet Take 1 tablet by mouth every evening.    Yes Historical Provider, MD  fluticasone (FLONASE) 50 MCG/ACT nasal spray Place 1 spray into both nostrils daily.   Yes Historical Provider, MD  hydrochlorothiazide (HYDRODIURIL) 25 MG tablet Take 25 mg by mouth daily.   Yes Historical Provider, MD  HYDROcodone-acetaminophen (NORCO) 5-325 MG per tablet Take 1 tablet by mouth every 6 (six) hours as needed for moderate pain. 04/13/14  Yes Tatyana Kirichenko, PA-C  hydroxypropyl methylcellulose / hypromellose (ISOPTO TEARS / GONIOVISC) 2.5 % ophthalmic solution Place 1 drop into both eyes 3 (three) times daily as needed for dry eyes.   Yes Historical Provider, MD  ibuprofen (ADVIL,MOTRIN) 200 MG tablet Take 200 mg by mouth every 6 (six) hours as needed for headache or moderate pain.    Yes Historical Provider, MD  mirtazapine (REMERON) 15 MG tablet Take 15 mg by mouth at bedtime.   Yes Historical Provider, MD  ondansetron (ZOFRAN ODT) 8 MG disintegrating tablet Take 1 tablet (8 mg total) by mouth every 8 (eight) hours as needed for nausea or vomiting. 04/13/14  Yes Tatyana Kirichenko, PA-C  pantoprazole (PROTONIX) 20 MG tablet Take 1 tablet (20 mg total) by mouth daily. 02/24/16  Yes Ramonita Lab, MD  pregabalin (LYRICA) 75 MG capsule Take 75 mg by mouth  2 (two) times daily.   Yes Historical Provider, MD  tiotropium (SPIRIVA) 18 MCG inhalation capsule Place 18 mcg into inhaler and inhale daily.   Yes Historical Provider, MD  diltiazem (CARDIZEM CD) 240 MG 24 hr capsule Take 1 capsule (240 mg total) by mouth daily. 03/02/16 05/31/16  Ok Anis, NP    Review of Systems    A 12 system review of systems was performed and was negative, except as noted in the history of present illness.  Physical Exam    VS:  BP 120/62 (BP Location: Left Arm, Patient Position: Sitting, Cuff Size: Normal)   Pulse 97   Ht  (1.702 m)   Wt 151 lb 8 oz (68.7 kg)   BMI 23.73 kg/m  , BMI Body mass index is 23.73 kg/m. GEN: Well-developed, well-nourished man seated comfortably in the exam room. He is accompanied by his two daughters. HEENT: No conjunctival pallor or scleral icterus.  OP clear with dentures in place..  Neck: Supple without lymphadenopathy, thyromegaly, JVD, or HJR. Cardiac: Irregularly irregular, no murmurs, rubs, or gallops. No clubbing, cyanosis, edema.  Radial, posterior tibial, dorsalis pedis pulses 2+ bilaterally.  Respiratory:  Respirations regular and unlabored, clear to auscultation bilaterally. GI: Soft, nontender, nondistended, BS + x 4. MS: no deformity or atrophy. Skin: warm and dry, no rash. Neuro:  Strength and sensation are intact. Psych: Normal affect today.  Accessory Clinical Findings    ECG - Coarse atrial fibrillationVersus atrial flutter with variable block. Ventricular rate 97 bpm. Heart rate decreased from previous tracing. Otherwise, no significant change (I have personally reviewed both tracings).  Assessment & Plan    1.  Persistent atrial flutter/fibrillation: Heart rate control seems to be improving with twice a day dosing of bisoprolol as well as once daily diltiazem. Patient continues to have some fatigue, though I suspect a lot of this is also due to deconditioning. Overall, he and his family report  improvement since her last visit. We will continue his current medication regimen. I think he may benefit from a trial of restoration of sinus rhythm, though we will need to wait on cardioversion until his INR has been therapeutic for at least 4 weeks. He is not interested in TEE.  2. Essential hypertension: Blood pressure is normal today. We will not make any medication changes at this time. We will repeat a basic metabolic panel today to ensure stable renal function and improved potassium, given hypokalemia at his last visit.  3. Seizure disorder: No seizures since our last visit. Phenytoin has been adjusted by his neurologist, given recent supratherapeutic level. Balance issues have resolved, as they were likely due to phenytoin toxicity.  4. Myalgias Non specific and potential due to muscle soreness related to increased activity after prolonged deconditioning. We will check a CK today. Home health assessment for PT has been previously ordered and is still pending.  5.  Dispo:  Return to clinic in 4 weeks  Yvonne Kendall, MD 04/19/2016, 2:21 PM

## 2016-04-19 ENCOUNTER — Encounter: Payer: Self-pay | Admitting: Internal Medicine

## 2016-04-19 ENCOUNTER — Telehealth: Payer: Self-pay | Admitting: *Deleted

## 2016-04-19 DIAGNOSIS — R7989 Other specified abnormal findings of blood chemistry: Secondary | ICD-10-CM

## 2016-04-19 DIAGNOSIS — I4891 Unspecified atrial fibrillation: Secondary | ICD-10-CM

## 2016-04-19 LAB — BASIC METABOLIC PANEL
BUN/Creatinine Ratio: 14 (ref 10–24)
BUN: 20 mg/dL (ref 8–27)
CALCIUM: 9.2 mg/dL (ref 8.6–10.2)
CHLORIDE: 99 mmol/L (ref 96–106)
CO2: 24 mmol/L (ref 18–29)
Creatinine, Ser: 1.42 mg/dL — ABNORMAL HIGH (ref 0.76–1.27)
GFR calc non Af Amer: 46 mL/min/{1.73_m2} — ABNORMAL LOW (ref >59)
GFR, EST AFRICAN AMERICAN: 53 mL/min/{1.73_m2} — AB (ref >59)
Glucose: 89 mg/dL (ref 65–99)
Potassium: 4.8 mmol/L (ref 3.5–5.2)
Sodium: 138 mmol/L (ref 134–144)

## 2016-04-19 LAB — CK: Total CK: 135 U/L (ref 24–204)

## 2016-04-19 NOTE — Telephone Encounter (Signed)
Spoke with patient's daughter, ok per DPR. She verbalized understanding of results and instructions. Order for BMP to be drawn by external lab in about 2 weeks. Will fax order to patient's home health agency and contact them regarding lab draw and will follow up with order faxed already for physical therapy. It is after 5 pm at this time, I will follow up on next day I am in the office.

## 2016-04-19 NOTE — Telephone Encounter (Signed)
-----   Message from Yvonne Kendall, MD sent at 04/19/2016  1:31 PM EDT ----- Please let Zachary Peterson know that his kidney function has worsened slightly since our last visit. This may in part be due to medications, including hydrochlorothiazide. I recommend that he discontinue hydrochlorothiazide and potassium chloride at this time. It is okay for him to liberalize his water intake a little bit. He should have a repeat BMP in about 2 weeks (can be drawn by home health).

## 2016-04-24 NOTE — Telephone Encounter (Signed)
Spoke with Toni Amend at [facility]. She did receive the request of BMP on 05/02/16 and for Physical Therapy. She stated the PT had already been received and I let her know the daughter was eager for the patient to start it. She also said they will fax the results of BMP to our office once resulted a day or two after they are drawn.

## 2016-04-24 NOTE — Telephone Encounter (Signed)
Lab order and PT order faxed to [facility].

## 2016-04-25 ENCOUNTER — Ambulatory Visit (INDEPENDENT_AMBULATORY_CARE_PROVIDER_SITE_OTHER): Payer: Self-pay | Admitting: Internal Medicine

## 2016-04-25 DIAGNOSIS — Z5181 Encounter for therapeutic drug level monitoring: Secondary | ICD-10-CM

## 2016-04-25 DIAGNOSIS — I4891 Unspecified atrial fibrillation: Secondary | ICD-10-CM

## 2016-04-25 LAB — POCT INR: INR: 2.1

## 2016-04-30 ENCOUNTER — Telehealth: Payer: Self-pay | Admitting: Internal Medicine

## 2016-04-30 NOTE — Telephone Encounter (Signed)
Returned call to patient's daughter, Agustin Cree. She stated patient's ankles and lower legs are swollen more than usual.  The increase of swelling began over the past week since discontinuation of HCTZ on 04/19/16. Slightly more on the left than the right. Denies SOB, chest pain or dizziness. Instructed her to have patient elevate legs when possible.  HCTZ and potassium were stopped on 04/19/16 by Dr End after resulting lab work from 04/18/16. Patient due to have repeat BMP on Wednesday, 05/02/16. Home health to draw. Daughter said she could bring him to [facility] for lab work if needed before then.  Will route to Dr End for advice.

## 2016-04-30 NOTE — Telephone Encounter (Signed)
I agree with leg elevation and repeating labs on 4/25. Thanks.

## 2016-04-30 NOTE — Telephone Encounter (Signed)
Pt daughter states pt ankles and lower legs are swollen. More so the left more than the right. Pt c/o swelling: STAT is pt has developed SOB within 24 hours  1. How long have you been experiencing swelling? Since fluid medication  2. Where is the swelling located? Bilateral ankles and lower legs  3.  Are you currently taking a "fluid pill"?no  4.  Are you currently SOB? no  5.  Have you traveled recently?no

## 2016-04-30 NOTE — Telephone Encounter (Signed)
Returned call to [employer] and gave her Dr Serita Kyle recommendations. She verbalized understanding and will call with any changes or if it worsens.

## 2016-05-02 ENCOUNTER — Ambulatory Visit (INDEPENDENT_AMBULATORY_CARE_PROVIDER_SITE_OTHER): Payer: Medicare HMO | Admitting: Cardiology

## 2016-05-02 DIAGNOSIS — I4891 Unspecified atrial fibrillation: Secondary | ICD-10-CM

## 2016-05-02 DIAGNOSIS — Z5181 Encounter for therapeutic drug level monitoring: Secondary | ICD-10-CM

## 2016-05-02 LAB — POCT INR: INR: 2.2

## 2016-05-03 ENCOUNTER — Telehealth: Payer: Self-pay | Admitting: *Deleted

## 2016-05-03 NOTE — Telephone Encounter (Signed)
Have not received lab work at this time. Called and spoke with Toni Amend at [facility]. She stated [facility] most recent labs for patient are from 04/19/16. We discussed that [facility] did receive the order from me for him to have BMP on 05/02/16. She is not sure if they were drawn today or yesterday. She will call the nurse who saw the patient and call back in the morning with update on labs and if they were drawn or not. If they were not, then patient may need to [facility] or have [facility] go back out to patient's home.

## 2016-05-03 NOTE — Telephone Encounter (Signed)
Spoke with Rinaldo Cloud at [facility]. She said someone would fax the results to [place] shortly at [phone].

## 2016-05-04 NOTE — Telephone Encounter (Signed)
This encounter was created in error - please disregard.

## 2016-05-04 NOTE — Telephone Encounter (Signed)
Lab report received and routed to Dr. Okey Dupre for his review. Labs appeared to be stable.

## 2016-05-08 ENCOUNTER — Telehealth: Payer: Self-pay | Admitting: *Deleted

## 2016-05-08 NOTE — Telephone Encounter (Signed)
Results called to patient's daughter. She verbalized understanding. She said patient's ankles and feet are still swelling. Leaves a small indention in his ankle from his socks. She encourages him to elevate his feet but she does not think he does it as often as needed. She asked did he he need to go back on his HCTZ and potassium which was stopped on 04/19/16 due to abnormal lab work.  Spoke with patient and wife. He denies SOB, chest pain or palpitations. BP today 127/80, HR 94 (states this is what his pressures have been running.) Patient does have ankle and foot swelling. Patient instructed to elevate legs as much as possible. Will route to Dr End for advice.

## 2016-05-08 NOTE — Telephone Encounter (Signed)
I recommend continued leg elevation and compression stockings during the daytime, if possible. He should weigh himself daily and let us know if he begins to gain weight (more than 2 pounds in 24 hours or 5 pounds in a week). I agree with holding HCTZ and potassium at this time. Thanks.

## 2016-05-08 NOTE — Telephone Encounter (Signed)
Spoke with [name]. She wrote down instructions and verbalized understanding to weigh daily first thing in the morning as well as the rest of the plan. She will let patient and wife know instructions as well.

## 2016-05-09 ENCOUNTER — Ambulatory Visit (INDEPENDENT_AMBULATORY_CARE_PROVIDER_SITE_OTHER): Payer: Medicare HMO | Admitting: Cardiovascular Disease

## 2016-05-09 DIAGNOSIS — I4891 Unspecified atrial fibrillation: Secondary | ICD-10-CM

## 2016-05-09 DIAGNOSIS — Z5181 Encounter for therapeutic drug level monitoring: Secondary | ICD-10-CM

## 2016-05-09 LAB — POCT INR: INR: 2.2

## 2016-05-11 ENCOUNTER — Telehealth: Payer: Self-pay | Admitting: Internal Medicine

## 2016-05-11 MED ORDER — BISOPROLOL FUMARATE 5 MG PO TABS: 2.5000 mg | ORAL_TABLET | Freq: Two times a day (BID) | ORAL | 3 refills | Status: DC

## 2016-05-11 NOTE — Telephone Encounter (Signed)
Pt daughter would like to speak with someone regarding pt betablocker, and the doseage.

## 2016-05-11 NOTE — Telephone Encounter (Signed)
Patients daughter states that he needs a refill of his bisoprolol and that the drug store is saying that it is too early. Reviewed with patient instructions and patient is to take Bisoprolol 5 mg 1/2 tablet twice a day. Sent in prescription to pharmacy and instructed her to call back if she has any problems.

## 2016-05-14 ENCOUNTER — Telehealth: Payer: Self-pay | Admitting: Internal Medicine

## 2016-05-14 NOTE — Telephone Encounter (Signed)
Returned call to daughter. She wanted to make sure the bisoprolol Rx was sent it to pharmacy so she could get the refill. Rx was sent in on 05/11/16 to verified pharmacy. She verbalized understanding and will check with pharmacy and call us back if anything else is needed.

## 2016-05-14 NOTE — Telephone Encounter (Signed)
Pt daughter would like to discuss pt betablocker. Please call.

## 2016-05-16 ENCOUNTER — Ambulatory Visit (INDEPENDENT_AMBULATORY_CARE_PROVIDER_SITE_OTHER): Payer: Medicare HMO | Admitting: Pharmacist

## 2016-05-16 DIAGNOSIS — I4891 Unspecified atrial fibrillation: Secondary | ICD-10-CM

## 2016-05-16 DIAGNOSIS — Z5181 Encounter for therapeutic drug level monitoring: Secondary | ICD-10-CM

## 2016-05-16 LAB — POCT INR: INR: 2.4

## 2016-05-23 ENCOUNTER — Ambulatory Visit (INDEPENDENT_AMBULATORY_CARE_PROVIDER_SITE_OTHER): Payer: Medicare HMO | Admitting: Pharmacist

## 2016-05-23 DIAGNOSIS — I4891 Unspecified atrial fibrillation: Secondary | ICD-10-CM

## 2016-05-23 DIAGNOSIS — Z5181 Encounter for therapeutic drug level monitoring: Secondary | ICD-10-CM

## 2016-05-23 LAB — POCT INR: INR: 2

## 2016-05-28 ENCOUNTER — Telehealth: Payer: Self-pay | Admitting: Internal Medicine

## 2016-05-28 NOTE — Telephone Encounter (Signed)
Returned call to [employer]arl[employer], Zachary Peterson daughter, listed on [place]. Zachary Peterson said Zachary Peterson's wife called her today and said "Daddy has blisters on his feet."  Agustin CreeDarlene has not seen it for herself but said she was with Zachary Peterson Saturday and he did not have them at that time. Zachary Peterson's lower extremity swelling is "better" per Zachary Peterson and that is not the issue at this time.  Advised that Zachary Peterson should contact PCP or urgent care in this matter. Zachary Peterson said her dad is not very agreeable to seeing PCP most times but she will go and take a look at it later today and call us back if needed. Zachary Peterson has appt with Dr Okey DupreEnd 05/30/16.

## 2016-05-28 NOTE — Telephone Encounter (Signed)
Pt c/o swelling: STAT is pt has developed SOB within 24 hours  1. How long have you been experiencing swelling? For about a few months  2. Where is the swelling located? Feet and legs  3.  Are you currently taking a "fluid pill"? Took him off because his labs abnormal  placed on support socks  4.  Are you currently SOB? No    5.  Have you traveled recently? No  But he has blisters now on the bottom of his feet.

## 2016-05-30 ENCOUNTER — Encounter: Payer: Self-pay | Admitting: Internal Medicine

## 2016-05-30 ENCOUNTER — Telehealth: Payer: Self-pay | Admitting: *Deleted

## 2016-05-30 ENCOUNTER — Ambulatory Visit (INDEPENDENT_AMBULATORY_CARE_PROVIDER_SITE_OTHER): Payer: Medicare HMO | Admitting: Internal Medicine

## 2016-05-30 ENCOUNTER — Ambulatory Visit (INDEPENDENT_AMBULATORY_CARE_PROVIDER_SITE_OTHER): Payer: Medicare HMO

## 2016-05-30 VITALS — BP 128/78 | HR 82 | Ht 61.0 in | Wt 154.8 lb

## 2016-05-30 DIAGNOSIS — E785 Hyperlipidemia, unspecified: Secondary | ICD-10-CM

## 2016-05-30 DIAGNOSIS — I481 Persistent atrial fibrillation: Secondary | ICD-10-CM | POA: Diagnosis not present

## 2016-05-30 DIAGNOSIS — R600 Localized edema: Secondary | ICD-10-CM | POA: Diagnosis not present

## 2016-05-30 DIAGNOSIS — I4819 Other persistent atrial fibrillation: Secondary | ICD-10-CM

## 2016-05-30 DIAGNOSIS — I4891 Unspecified atrial fibrillation: Secondary | ICD-10-CM

## 2016-05-30 DIAGNOSIS — Z01818 Encounter for other preprocedural examination: Secondary | ICD-10-CM

## 2016-05-30 DIAGNOSIS — Z5181 Encounter for therapeutic drug level monitoring: Secondary | ICD-10-CM

## 2016-05-30 DIAGNOSIS — M79605 Pain in left leg: Secondary | ICD-10-CM | POA: Diagnosis not present

## 2016-05-30 DIAGNOSIS — R5383 Other fatigue: Secondary | ICD-10-CM | POA: Diagnosis not present

## 2016-05-30 DIAGNOSIS — R238 Other skin changes: Secondary | ICD-10-CM | POA: Diagnosis not present

## 2016-05-30 DIAGNOSIS — Z79899 Other long term (current) drug therapy: Secondary | ICD-10-CM | POA: Diagnosis not present

## 2016-05-30 DIAGNOSIS — M79604 Pain in right leg: Secondary | ICD-10-CM

## 2016-05-30 DIAGNOSIS — Z8669 Personal history of other diseases of the nervous system and sense organs: Secondary | ICD-10-CM | POA: Diagnosis not present

## 2016-05-30 LAB — POCT INR: INR: 2.1

## 2016-05-30 MED ORDER — FUROSEMIDE 20 MG PO TABS: 20.0000 mg | ORAL_TABLET | Freq: Every day | ORAL | 3 refills | Status: DC | PRN

## 2016-05-30 NOTE — Progress Notes (Signed)
Follow-up Outpatient Visit Date: 05/30/2016  Primary Care Provider: [place] [address]  [place] [address]  Chief Complaint: Follow-up fatigue and atrial fibrillation  HPI:  Mr. Rishel is a 83 y.o. year-old male with history of persistent atrial fibrillation, hypertension, hyperlipidemia, COPD, and seizure disorder, who presents for follow-up of atrial fibrillation with chronic fatigue. I met him during a hospitalization in February. He presented with diarrhea secondary to normal virus that was complicated by seizure and newly diagnosed atrial fibrillation. We agreed to a rate control strategy but has had difficulty maintaining adequate heart rates. Currently, Mr. Zagal is on bisoprolol and diltiazem. He notes that in the morning before taking his medication, that his heart rate is often in the 110's to 120s. However, it drops below 100 an hour or two after taking his medications. He remains quite fatigued. He does not have the energy that he had last year at this time. He denies chest pain, shortness of breath, and orthopnea. However, his leg swelling has worsened a bit since HCTZ was discontinued last month due to acute kidney injury. He has not had any palpitations or lightheadedness. He has remained on warfarin without bleeding. His INR has been therapeutic for more than a month now.  Mr. Schlender noted blisters on the soles of both feet over the weekend (4-5 days ago). The blisters on the right foot has been itching at times. He has not noticed any bleeding or drainage. He has not been on any new medications in the last few weeks. He has been wearing his compression stockings regularly. He has never had a rash like this in the past.  Finally, Mr. Pichardo notes that he has had progressive pain in his anterior thighs with walking. He can no longer walk more than 50 feet without needing to stop and  rest.  --------------------------------------------------------------------------------------------------  Cardiovascular History & Procedures: Cardiovascular Problems:  Persistent atrial fibrillation  Heart failure with preserved ejection fraction  Risk Factors:  Hypertension, male gender, and age greater than 58  Cath/PCI:  None  CV Surgery:  None  EP Procedures and Devices:  None  Non-Invasive Evaluation(s):  TTE (02/21/16): Normal LV size and wall thickness with LVEF of 55-65%. Unable to assess diastolic function due to atrial fibrillation. Mild mitral regurgitation. Upper normal RV size with normal contraction. Mildly elevated pulmonary artery pressure.  Recent CV Pertinent Labs: Lab Results  Component Value Date   INR 2.0 05/23/2016   INR 1.8 (A) 04/05/2016   K 4.8 04/18/2016   K 3.2 (L) 11/23/2013   MG 2.1 03/28/2016   BUN 20 04/18/2016   BUN 17 11/23/2013   CREATININE 1.42 (H) 04/18/2016   CREATININE 1.16 11/23/2013    Past medical and surgical history were reviewed and updated in EPIC.  Outpatient Encounter Prescriptions as of 05/30/2016  Medication Sig  . bisoprolol (ZEBETA) 5 MG tablet Take 0.5 tablets (2.5 mg total) by mouth 2 (two) times daily.  . calcium carbonate (TUMS - DOSED IN MG ELEMENTAL CALCIUM) 500 MG chewable tablet Chew 1 tablet (200 mg of elemental calcium total) by mouth 3 (three) times daily with meals as needed for indigestion or heartburn.  . diazepam (VALIUM) 5 MG tablet Take 5 mg by mouth 2 (two) times daily. ANXIETY  . DILANTIN 100 MG ER capsule Take 100 mg by mouth 3 (three) times daily.  [name] diltiazem (CARDIZEM CD) 300 MG 24 hr capsule Take 1 capsule (300 mg total) by mouth  daily.  . fluticasone (FLONASE) 50 MCG/ACT nasal spray Place 1 spray into both nostrils daily.  [name] HYDROcodone-acetaminophen (NORCO) 5-325 MG per tablet Take 1 tablet by mouth every 6 (six) hours as needed for moderate pain.  . hydroxypropyl methylcellulose /  hypromellose (ISOPTO TEARS / GONIOVISC) 2.5 % ophthalmic solution Place 1 drop into both eyes 3 (three) times daily as needed for dry eyes.  [name] ibuprofen (ADVIL,MOTRIN) 200 MG tablet Take 200 mg by mouth every 6 (six) hours as needed for headache or moderate pain.   . mirtazapine (REMERON) 15 MG tablet Take 15 mg by mouth at bedtime.  . ondansetron (ZOFRAN ODT) 8 MG disintegrating tablet Take 1 tablet (8 mg total) by mouth every 8 (eight) hours as needed for nausea or vomiting.  . pantoprazole (PROTONIX) 20 MG tablet Take 20 mg by mouth daily.  . pregabalin (LYRICA) 75 MG capsule Take 75 mg by mouth 2 (two) times daily.  [name] tiotropium (SPIRIVA) 18 MCG inhalation capsule Place 18 mcg into inhaler and inhale daily.  [name] warfarin (COUMADIN) 2.5 MG tablet Take 1 tablet (2.5 mg total) by mouth as directed.   No facility-administered encounter medications on file as of 05/30/2016.     Allergies: Aspirin; Codeine; and Oxycodone  Social History   Social History  . Marital status: Married    Spouse name: N/A  . Number of children: N/A  . Years of education: N/A   Occupational History  . Not on file.   Social History Main Topics  . Smoking status: Former Research scientist (life sciences)  . Smokeless tobacco: Former Systems developer  . Alcohol use No  . Drug use: No  . Sexual activity: No   Other Topics Concern  . Not on file   Social History Narrative  . No narrative on file    Family History  Problem Relation Age of Onset  . Heart disease Mother   . Hypertension Father     Review of Systems: A 12-system review of systems was performed and was negative except as noted in the HPI.  --------------------------------------------------------------------------------------------------  Physical Exam: BP 128/78 (BP Location: Right Arm, Patient Position: Sitting, Cuff Size: Normal)   Pulse 82   Ht '5\' 1"'$  (1.549 m)   Wt 154 lb 12 oz (70.2 kg)   BMI 29.24 kg/m   General:  Overweight but frail-appearing elderly man, seated  in the exam room. He is accompanied by his daughter. HEENT: No conjunctival pallor or scleral icterus.  Moist mucous membranes.  OP clear. Neck: Supple without lymphadenopathy, thyromegaly, JVD, or HJR. Lungs: Normal work of breathing.  Mildly diminished breath sounds throughout without wheezes or crackles. Heart: Irregularly irregular rhythm without murmurs or rubs.  Non-displaced PMI. Abd: Bowel sounds present.  Soft, NT/ND without hepatosplenomegaly Ext: 1+ ankle edema bilaterally.  Radial, PT, and DP pulses are 2+ bilaterally. Skin: There are multiple vesicles and bullae on the soles of both feet, more pronounced on the right. There are some vesicles and areas of bloody crusting between the toes, predominantly on the left foot.  EKG: Atrial flutter with variable block versus coarse atrial fibrillation. Ventricular rate is 78 bpm. Nonspecific ST segment changes.  Lab Results  Component Value Date   WBC 7.4 03/28/2016   HGB 14.6 03/28/2016   HCT 42.4 03/28/2016   MCV 91.5 03/28/2016   PLT 278 03/28/2016    Lab Results  Component Value Date   NA 138 04/18/2016   K 4.8 04/18/2016   CL 99 04/18/2016   CO2  24 04/18/2016   BUN 20 04/18/2016   CREATININE 1.42 (H) 04/18/2016   GLUCOSE 89 04/18/2016   ALT 14 (L) 03/28/2016    No results found for: CHOL, HDL, LDLCALC, LDLDIRECT, TRIG, CHOLHDL  --------------------------------------------------------------------------------------------------  ASSESSMENT AND PLAN: Persistent atrial fibrillation Mr. Lamay is adequately rate controlled today with his EKG demonstrating coarse atrial fibrillation versus atrial flutter with variable block and a ventricular rate at 78 bpm. He continues to have marked fatigue, which is likely multifactorial, including atrial fibrillation, multiple medications, and deconditioning. If his INR remains therapeutic today, we have agreed to proceed with direct-current cardioversion next week. We have discussed the  procedure and its risks and will tentatively planned for next Wednesday (06/06/16). He is due for INR check today.  Bullae There are bullae on both feet, which have developed over the last week. I am unsure of the etiology, though warfarin skin toxicity is a consideration. Bullous pemphigoid would also need to be considered. We will refer the patient for urgent dermatology evaluation this week. This needs to be completed before we proceed with cardioversion, as if there is suspicion for warfarin toxicity, and alternative anticoagulation strategy will need to be arranged.  Fatigue This has been a chronic problem and is likely multifactorial. As above, we will attempt a rhythm control strategy with DCCV next week for Mr. Breuer's atrial fibrillation. I will check a CBC, CMP, and phenytoin level today, as he has had fatigue in the setting of supratherapeutic phenytoin level in the past.  Lower extremity edema This is likely a combination of venous insufficiency and heart failure with preserved ejection fraction complicated by persistent atrial fibrillation. We will check a complete metabolic panel today and initiate furosemide 20 mg by mouth daily 3 days, followed by 20 mg daily as needed for edema and weight gain. I have encouraged Mr. Harkless to continue wearing his compression stockings.  Hyperlipidemia Mr. Vogan is due for annual lipid evaluation. He is currently off statin therapy. We will check a lipid panel today.  Leg pain This could be due to a number of causes. We will defer further workup until after evaluation of his bullae and cardioversion. We may ultimately need arterial Dopplers and/or ABIs to PVD.  Follow-up: Return to clinic in 1 month.  Nelva Bush, MD 05/30/2016 1:44 PM

## 2016-05-30 NOTE — Telephone Encounter (Signed)
Spoke with Irving BurtonEmily at [facility] about urgent referral for patient who said to fax referral information. Referral form faxed to [facility] and they will contact patient to schedule an appointment.

## 2016-05-30 NOTE — Addendum Note (Signed)
Addended by: Stann MainlandLARK, Selma Rodelo O on: 05/30/2016 02:58 PM   Modules accepted: Orders

## 2016-05-30 NOTE — Patient Instructions (Addendum)
Medication Instructions:  Your physician has recommended you make the following change in your medication:  1- TAKE Furosemide 20 mg (1 tablet) by mouth once a day for 3 days, then take 1 tablet once a day AS NEEDED FOR SWELLING.   Labwork: Your physician recommends that you return for lab work in: TODAY (CBC, CMP, LIPID, PT/INR, Dilantin).   Testing/Procedures: You are scheduled for a Cardioversion on ___5/30/18______ with Dr.__End____ [facility] at __06:30___ a.m. on the day of your procedure.  DIET INSTRUCTIONS:  Nothing to eat or drink after midnight except your medications with a sip of water.         1) Labs: ___TODAY______  2) Medications:  YOU MAY TAKE ALL of your remaining medications with a small amount of water.  3) Must have a responsible person to drive you home.  4) Bring a current list of your medications and current insurance cards.    If you have any questions after you get home, please call the office at [phone]    Follow-Up: You have been referred to DERMATOLOGY. -  I will contact you will additional information concerning referral.   Your physician recommends that you schedule a follow-up appointment in: 1 MONTH WITH DR END.   If you need a refill on your cardiac medications before your next appointment, please call your pharmacy.     Electrical Cardioversion Electrical cardioversion is the delivery of a jolt of electricity to restore a normal rhythm to the heart. A rhythm that is too fast or is not regular keeps the heart from pumping well. In this procedure, sticky patches or metal paddles are placed on the chest to deliver electricity to the heart from a device. This procedure may be done in an emergency if:  There is low or no blood pressure as a result of the heart rhythm.  Normal rhythm must be restored as fast as possible to protect the brain and heart from further damage.  It may save a life. This  procedure may also be done for irregular or fast heart rhythms that are not immediately life-threatening. Tell a health care provider about:  Any allergies you have.  All medicines you are taking, including vitamins, herbs, eye drops, creams, and over-the-counter medicines.  Any problems you or family members have had with anesthetic medicines.  Any blood disorders you have.  Any surgeries you have had.  Any medical conditions you have.  Whether you are pregnant or may be pregnant. What are the risks? Generally, this is a safe procedure. However, problems may occur, including:  Allergic reactions to medicines.  A blood clot that breaks free and travels to other parts of your body.  The possible return of an abnormal heart rhythm within hours or days after the procedure.  Your heart stopping (cardiac arrest). This is rare. What happens before the procedure? Medicines   Your health care provider may have you start taking:  Blood-thinning medicines (anticoagulants) so your blood does not clot as easily.  Medicines may be given to help stabilize your heart rate and rhythm.  Ask your health care provider about changing or stopping your regular medicines. This is especially important if you are taking diabetes medicines or blood thinners. General instructions   Plan to have someone take you home from the hospital or clinic.  If you will be going home right after the procedure, plan to have someone with you for 24 hours.  Follow instructions  from your health care provider about eating or drinking restrictions. What happens during the procedure?  To lower your risk of infection:  Your health care team will wash or sanitize their hands.  Your skin will be washed with soap.  An IV tube will be inserted into one of your veins.  You will be given a medicine to help you relax (sedative).  Sticky patches (electrodes) or metal paddles may be placed on your chest.  An  electrical shock will be delivered. The procedure may vary among health care providers and hospitals. What happens after the procedure?  Your blood pressure, heart rate, breathing rate, and blood oxygen level will be monitored until the medicines you were given have worn off.  Do not drive for 24 hours if you were given a sedative.  Your heart rhythm will be watched to make sure it does not change. This information is not intended to replace advice given to you by your health care provider. Make sure you discuss any questions you have with your health care provider. Document Released: 12/15/2001 Document Revised: 08/24/2015 Document Reviewed: 07/01/2015 Elsevier Interactive Patient Education  2017 [employer].

## 2016-05-31 LAB — CBC WITH DIFFERENTIAL/PLATELET
BASOS ABS: 0 10*3/uL (ref 0.0–0.2)
Basos: 1 %
EOS (ABSOLUTE): 0.1 10*3/uL (ref 0.0–0.4)
Eos: 2 %
Hematocrit: 41.1 % (ref 37.5–51.0)
Hemoglobin: 13.9 g/dL (ref 13.0–17.7)
IMMATURE GRANS (ABS): 0 10*3/uL (ref 0.0–0.1)
Immature Granulocytes: 0 %
LYMPHS: 21 %
Lymphocytes Absolute: 1.3 10*3/uL (ref 0.7–3.1)
MCH: 30 pg (ref 26.6–33.0)
MCHC: 33.8 g/dL (ref 31.5–35.7)
MCV: 89 fL (ref 79–97)
Monocytes Absolute: 0.5 10*3/uL (ref 0.1–0.9)
Monocytes: 8 %
NEUTROS ABS: 4.2 10*3/uL (ref 1.4–7.0)
Neutrophils: 68 %
Platelets: 312 10*3/uL (ref 150–379)
RBC: 4.64 x10E6/uL (ref 4.14–5.80)
RDW: 15.2 % (ref 12.3–15.4)
WBC: 6.1 10*3/uL (ref 3.4–10.8)

## 2016-05-31 LAB — LIPID PANEL
CHOL/HDL RATIO: 7.1 ratio — AB (ref 0.0–5.0)
CHOLESTEROL TOTAL: 271 mg/dL — AB (ref 100–199)
HDL: 38 mg/dL — ABNORMAL LOW (ref >39)
LDL CALC: 204 mg/dL — AB (ref 0–99)
Triglycerides: 147 mg/dL (ref 0–149)
VLDL Cholesterol Cal: 29 mg/dL (ref 5–40)

## 2016-05-31 LAB — COMPREHENSIVE METABOLIC PANEL
ALK PHOS: 143 IU/L — AB (ref 39–117)
ALT: 7 IU/L (ref 0–44)
AST: 14 IU/L (ref 0–40)
Albumin/Globulin Ratio: 1.2 (ref 1.2–2.2)
Albumin: 4.2 g/dL (ref 3.5–4.7)
BILIRUBIN TOTAL: 0.2 mg/dL (ref 0.0–1.2)
BUN/Creatinine Ratio: 11 (ref 10–24)
BUN: 14 mg/dL (ref 8–27)
CHLORIDE: 102 mmol/L (ref 96–106)
CO2: 25 mmol/L (ref 18–29)
Calcium: 9 mg/dL (ref 8.6–10.2)
Creatinine, Ser: 1.27 mg/dL (ref 0.76–1.27)
GFR calc Af Amer: 60 mL/min/{1.73_m2} (ref >59)
GFR calc non Af Amer: 52 mL/min/{1.73_m2} — ABNORMAL LOW (ref >59)
GLUCOSE: 90 mg/dL (ref 65–99)
Globulin, Total: 3.6 g/dL (ref 1.5–4.5)
Potassium: 4.4 mmol/L (ref 3.5–5.2)
Sodium: 139 mmol/L (ref 134–144)
TOTAL PROTEIN: 7.8 g/dL (ref 6.0–8.5)

## 2016-05-31 LAB — PROTIME-INR
INR: 1.9 — ABNORMAL HIGH (ref 0.8–1.2)
PROTHROMBIN TIME: 19.2 s — AB (ref 9.1–12.0)

## 2016-05-31 NOTE — Telephone Encounter (Signed)
Confirmed with office that patient has appt tomorrow, 06/01/16, morning with Dr Roseanne KaufmanIsenstein.

## 2016-06-01 ENCOUNTER — Telehealth: Payer: Self-pay | Admitting: Internal Medicine

## 2016-06-01 ENCOUNTER — Other Ambulatory Visit
Admission: RE | Admit: 2016-06-01 | Discharge: 2016-06-01 | Disposition: A | Discharge status: Home / Self Care | Payer: Medicare HMO | Source: Ambulatory Visit | Attending: Internal Medicine | Admitting: Internal Medicine

## 2016-06-01 DIAGNOSIS — Z79899 Other long term (current) drug therapy: Secondary | ICD-10-CM | POA: Diagnosis present

## 2016-06-01 DIAGNOSIS — Z8669 Personal history of other diseases of the nervous system and sense organs: Secondary | ICD-10-CM | POA: Insufficient documentation

## 2016-06-01 DIAGNOSIS — Z5181 Encounter for therapeutic drug level monitoring: Secondary | ICD-10-CM | POA: Insufficient documentation

## 2016-06-01 LAB — PHENYTOIN LEVEL, TOTAL: PHENYTOIN LVL: 17.9 ug/mL (ref 10.0–20.0)

## 2016-06-01 NOTE — Telephone Encounter (Signed)
Dr. Roseanne KaufmanIsenstein called stating that she did not feel this was warfarin skin necrosis but that she did some biopsies. She states that the biopsies will not be picked up until Tuesday due to the holiday and she may get the report back on Wednesday or Thursday of next week. She states that she will fax us the report when they receive it. She also provided her cell number [phone] if Dr. Okey DupreEnd should have any questions.

## 2016-06-01 NOTE — Telephone Encounter (Signed)
I spoke with [name] re: results of his labs. There were notable for LDL > 200 and INR of 1.9. Given plan for DCCV next week, we have discussed implications of subtherapeutic INR. We have agreed to proceed with TEE-guided DCCV on 06/07/16. He will have an INR check the day before to ensure adequate anticoagulation. We will defer starting statin therapy at this time and readdress this following DCCV. If DCCV is successful at restoring sinus rhythm, we can consider stopping diltiazem and restarting Vytorin. If dilt needs to be continued, we will need to consider a different statin such as rosuvastatin.  Yvonne Kendallhristopher Kingstyn Deruiter, MD [facility] Pager: [phone]

## 2016-06-02 NOTE — Telephone Encounter (Signed)
I spoke with Dr. Roseanne KaufmanIsenstein. As it is unlike that his rash is related to warfarin, we will proceed with TEE-guided cardioversion on Wednesday, as planned. Thanks.

## 2016-06-05 ENCOUNTER — Ambulatory Visit (INDEPENDENT_AMBULATORY_CARE_PROVIDER_SITE_OTHER): Payer: Medicare HMO | Admitting: Interventional Cardiology

## 2016-06-05 ENCOUNTER — Other Ambulatory Visit: Payer: Self-pay | Admitting: Internal Medicine

## 2016-06-05 ENCOUNTER — Other Ambulatory Visit: Payer: Self-pay | Admitting: Pharmacist

## 2016-06-05 DIAGNOSIS — I4891 Unspecified atrial fibrillation: Secondary | ICD-10-CM

## 2016-06-05 DIAGNOSIS — Z5181 Encounter for therapeutic drug level monitoring: Secondary | ICD-10-CM

## 2016-06-05 DIAGNOSIS — I4819 Other persistent atrial fibrillation: Secondary | ICD-10-CM

## 2016-06-05 LAB — POCT INR: INR: 2.5

## 2016-06-05 MED ORDER — WARFARIN SODIUM 2.5 MG PO TABS: ORAL_TABLET | ORAL | 1 refills | Status: DC

## 2016-06-05 NOTE — Telephone Encounter (Signed)
Unable to reach [name], patient's daughter, left message to call back.  Called and spoke with patient and wife. Wife wrote down the medications not to take tomorrow morning prior to procedure and verbalized understanding.  End, [name], MD  You 4 days ago    [name],   Given that [name]'s INR was 1.9 by lab check on Wednesday, can you also schedule him for TEE at the time of cardioversion this coming Wednesday? He should hold his diltiazem and bisoprolol on the morning of the procedure. I believe that he is already scheduled for an INR check next Tuesday, which he should keep. Let me know if any issues arise. Thanks.   [name]

## 2016-06-05 NOTE — Telephone Encounter (Signed)
Received incoming call from patient's Daughter, Agustin CreeDarlene. Gave her information to hold diltiazem and bisoprolol tomorrow morning and she verbalized understanding.

## 2016-06-05 NOTE — Telephone Encounter (Signed)
Reviewed TEE/DCCV instructions w/pt who verbalized understanding.

## 2016-06-05 NOTE — Telephone Encounter (Signed)
[facility] message with [facility] scheduling to change procedure to TEE- guided DCCV.

## 2016-06-06 ENCOUNTER — Encounter
Admission: RE | Disposition: A | Discharge status: Home / Self Care | Payer: Self-pay | Source: Ambulatory Visit | Attending: Internal Medicine

## 2016-06-06 ENCOUNTER — Ambulatory Visit (HOSPITAL_BASED_OUTPATIENT_CLINIC_OR_DEPARTMENT_OTHER)
Admission: RE | Admit: 2016-06-06 | Discharge: 2016-06-06 | Disposition: A | Discharge status: Home / Self Care | Payer: Medicare HMO | Source: Ambulatory Visit | Attending: Internal Medicine | Admitting: Internal Medicine

## 2016-06-06 ENCOUNTER — Ambulatory Visit
Admission: RE | Admit: 2016-06-06 | Discharge: 2016-06-06 | Disposition: A | Discharge status: Home / Self Care | Payer: Medicare HMO | Source: Ambulatory Visit | Attending: Internal Medicine | Admitting: Internal Medicine

## 2016-06-06 ENCOUNTER — Ambulatory Visit: Payer: Medicare HMO | Admitting: Anesthesiology

## 2016-06-06 ENCOUNTER — Encounter: Payer: Self-pay | Admitting: *Deleted

## 2016-06-06 DIAGNOSIS — I110 Hypertensive heart disease with heart failure: Secondary | ICD-10-CM | POA: Diagnosis not present

## 2016-06-06 DIAGNOSIS — F329 Major depressive disorder, single episode, unspecified: Secondary | ICD-10-CM | POA: Insufficient documentation

## 2016-06-06 DIAGNOSIS — G40909 Epilepsy, unspecified, not intractable, without status epilepticus: Secondary | ICD-10-CM | POA: Insufficient documentation

## 2016-06-06 DIAGNOSIS — I4891 Unspecified atrial fibrillation: Secondary | ICD-10-CM

## 2016-06-06 DIAGNOSIS — Z7901 Long term (current) use of anticoagulants: Secondary | ICD-10-CM | POA: Diagnosis not present

## 2016-06-06 DIAGNOSIS — Z8249 Family history of ischemic heart disease and other diseases of the circulatory system: Secondary | ICD-10-CM | POA: Diagnosis not present

## 2016-06-06 DIAGNOSIS — Z885 Allergy status to narcotic agent status: Secondary | ICD-10-CM | POA: Insufficient documentation

## 2016-06-06 DIAGNOSIS — Z886 Allergy status to analgesic agent status: Secondary | ICD-10-CM | POA: Insufficient documentation

## 2016-06-06 DIAGNOSIS — J449 Chronic obstructive pulmonary disease, unspecified: Secondary | ICD-10-CM | POA: Diagnosis not present

## 2016-06-06 DIAGNOSIS — R238 Other skin changes: Secondary | ICD-10-CM | POA: Diagnosis not present

## 2016-06-06 DIAGNOSIS — E785 Hyperlipidemia, unspecified: Secondary | ICD-10-CM | POA: Insufficient documentation

## 2016-06-06 DIAGNOSIS — I4819 Other persistent atrial fibrillation: Secondary | ICD-10-CM | POA: Diagnosis present

## 2016-06-06 DIAGNOSIS — I4892 Unspecified atrial flutter: Secondary | ICD-10-CM | POA: Diagnosis not present

## 2016-06-06 DIAGNOSIS — I340 Nonrheumatic mitral (valve) insufficiency: Secondary | ICD-10-CM | POA: Insufficient documentation

## 2016-06-06 DIAGNOSIS — Z87891 Personal history of nicotine dependence: Secondary | ICD-10-CM | POA: Insufficient documentation

## 2016-06-06 DIAGNOSIS — I5030 Unspecified diastolic (congestive) heart failure: Secondary | ICD-10-CM | POA: Diagnosis not present

## 2016-06-06 DIAGNOSIS — I480 Paroxysmal atrial fibrillation: Secondary | ICD-10-CM | POA: Insufficient documentation

## 2016-06-06 DIAGNOSIS — R5382 Chronic fatigue, unspecified: Secondary | ICD-10-CM | POA: Insufficient documentation

## 2016-06-06 DIAGNOSIS — R600 Localized edema: Secondary | ICD-10-CM | POA: Insufficient documentation

## 2016-06-06 DIAGNOSIS — I481 Persistent atrial fibrillation: Secondary | ICD-10-CM | POA: Diagnosis not present

## 2016-06-06 DIAGNOSIS — Z79899 Other long term (current) drug therapy: Secondary | ICD-10-CM | POA: Insufficient documentation

## 2016-06-06 DIAGNOSIS — H9190 Unspecified hearing loss, unspecified ear: Secondary | ICD-10-CM | POA: Diagnosis not present

## 2016-06-06 HISTORY — PX: CARDIOVERSION: EP1203

## 2016-06-06 HISTORY — PX: TEE WITHOUT CARDIOVERSION: SHX5443

## 2016-06-06 LAB — PROTIME-INR
INR: 2.46
Prothrombin Time: 27.1 seconds — ABNORMAL HIGH (ref 11.4–15.2)

## 2016-06-06 SURGERY — ECHOCARDIOGRAM, TRANSESOPHAGEAL
Anesthesia: General

## 2016-06-06 MED ORDER — LIDOCAINE VISCOUS 2 % MT SOLN
OROMUCOSAL | Status: AC
  Filled 2016-06-06: qty 15

## 2016-06-06 MED ORDER — PROPOFOL 10 MG/ML IV BOLUS: INTRAVENOUS | Status: DC | PRN

## 2016-06-06 MED ORDER — LIDOCAINE HCL (PF) 2 % IJ SOLN
INTRAMUSCULAR | Status: AC
  Filled 2016-06-06: qty 2

## 2016-06-06 MED ORDER — SODIUM CHLORIDE FLUSH 0.9 % IV SOLN
INTRAVENOUS | Status: AC
  Filled 2016-06-06: qty 10

## 2016-06-06 MED ORDER — PROPOFOL 10 MG/ML IV BOLUS
INTRAVENOUS | Status: DC | PRN
  Administered 2016-06-06: 40 mg via INTRAVENOUS
  Administered 2016-06-06: 10 mg via INTRAVENOUS
  Administered 2016-06-06: 20 mg via INTRAVENOUS

## 2016-06-06 MED ORDER — BUTAMBEN-TETRACAINE-BENZOCAINE 2-2-14 % EX AERO
INHALATION_SPRAY | CUTANEOUS | Status: AC
  Filled 2016-06-06: qty 20

## 2016-06-06 MED ORDER — FENTANYL CITRATE (PF) 100 MCG/2ML IJ SOLN
INTRAMUSCULAR | Status: AC
  Filled 2016-06-06: qty 2

## 2016-06-06 MED ORDER — SODIUM CHLORIDE 0.9 % IV SOLN
INTRAVENOUS | Status: DC
  Administered 2016-06-06 (×2): via INTRAVENOUS

## 2016-06-06 MED ORDER — PROPOFOL 10 MG/ML IV BOLUS
INTRAVENOUS | Status: AC
  Filled 2016-06-06: qty 40

## 2016-06-06 NOTE — Transfer of Care (Signed)
Immediate Anesthesia Transfer of Care Note  Patient: Zachary Peterson  Procedure(s) Performed: Procedure(s): TRANSESOPHAGEAL ECHOCARDIOGRAM (TEE) (N/A) CARDIOVERSION (N/A)  Patient Location: Radiology  Anesthesia Type:General  Level of Consciousness: drowsy and patient cooperative  Airway & Oxygen Therapy: Patient Spontanous Breathing and Patient connected to nasal cannula oxygen  Post-op Assessment: Report given to RN, Post -op Vital signs reviewed and stable and Patient moving all extremities X 4  Post vital signs: Reviewed and stable  Last Vitals:  Vitals:   06/06/16 0748 06/06/16 0750  BP: (P) 104/67 104/76  Pulse:  77  Resp:    Temp: (!) (P) 36 C (!) 36 C    Last Pain:  Vitals:   06/06/16 0750  TempSrc: Axillary         Complications: No apparent anesthesia complications

## 2016-06-06 NOTE — H&P (View-Only) (Signed)
Follow-up Outpatient Visit Date: 05/30/2016  Primary Care Provider: [place] [address]  [place] [address]  Chief Complaint: Follow-up fatigue and atrial fibrillation  HPI:  Mr. Sorbello is a 83 y.o. year-old male with history of persistent atrial fibrillation, hypertension, hyperlipidemia, COPD, and seizure disorder, who presents for follow-up of atrial fibrillation with chronic fatigue. I met him during a hospitalization in February. He presented with diarrhea secondary to normal virus that was complicated by seizure and newly diagnosed atrial fibrillation. We agreed to a rate control strategy but has had difficulty maintaining adequate heart rates. Currently, Mr. Prusinski is on bisoprolol and diltiazem. He notes that in the morning before taking his medication, that his heart rate is often in the 110's to 120s. However, it drops below 100 an hour or two after taking his medications. He remains quite fatigued. He does not have the energy that he had last year at this time. He denies chest pain, shortness of breath, and orthopnea. However, his leg swelling has worsened a bit since HCTZ was discontinued last month due to acute kidney injury. He has not had any palpitations or lightheadedness. He has remained on warfarin without bleeding. His INR has been therapeutic for more than a month now.  Mr. Brechtel noted blisters on the soles of both feet over the weekend (4-5 days ago). The blisters on the right foot has been itching at times. He has not noticed any bleeding or drainage. He has not been on any new medications in the last few weeks. He has been wearing his compression stockings regularly. He has never had a rash like this in the past.  Finally, Mr. Manville notes that he has had progressive pain in his anterior thighs with walking. He can no longer walk more than 50 feet without needing to stop and  rest.  --------------------------------------------------------------------------------------------------  Cardiovascular History & Procedures: Cardiovascular Problems:  Persistent atrial fibrillation  Heart failure with preserved ejection fraction  Risk Factors:  Hypertension, male gender, and age greater than 64  Cath/PCI:  None  CV Surgery:  None  EP Procedures and Devices:  None  Non-Invasive Evaluation(s):  TTE (02/21/16): Normal LV size and wall thickness with LVEF of 55-65%. Unable to assess diastolic function due to atrial fibrillation. Mild mitral regurgitation. Upper normal RV size with normal contraction. Mildly elevated pulmonary artery pressure.  Recent CV Pertinent Labs: Lab Results  Component Value Date   INR 2.0 05/23/2016   INR 1.8 (A) 04/05/2016   K 4.8 04/18/2016   K 3.2 (L) 11/23/2013   MG 2.1 03/28/2016   BUN 20 04/18/2016   BUN 17 11/23/2013   CREATININE 1.42 (H) 04/18/2016   CREATININE 1.16 11/23/2013    Past medical and surgical history were reviewed and updated in EPIC.  Outpatient Encounter Prescriptions as of 05/30/2016  Medication Sig  . bisoprolol (ZEBETA) 5 MG tablet Take 0.5 tablets (2.5 mg total) by mouth 2 (two) times daily.  . calcium carbonate (TUMS - DOSED IN MG ELEMENTAL CALCIUM) 500 MG chewable tablet Chew 1 tablet (200 mg of elemental calcium total) by mouth 3 (three) times daily with meals as needed for indigestion or heartburn.  . diazepam (VALIUM) 5 MG tablet Take 5 mg by mouth 2 (two) times daily. ANXIETY  . DILANTIN 100 MG ER capsule Take 100 mg by mouth 3 (three) times daily.  [name] diltiazem (CARDIZEM CD) 300 MG 24 hr capsule Take 1 capsule (300 mg total) by mouth  daily.  . fluticasone (FLONASE) 50 MCG/ACT nasal spray Place 1 spray into both nostrils daily.  [name] HYDROcodone-acetaminophen (NORCO) 5-325 MG per tablet Take 1 tablet by mouth every 6 (six) hours as needed for moderate pain.  . hydroxypropyl methylcellulose /  hypromellose (ISOPTO TEARS / GONIOVISC) 2.5 % ophthalmic solution Place 1 drop into both eyes 3 (three) times daily as needed for dry eyes.  [name] ibuprofen (ADVIL,MOTRIN) 200 MG tablet Take 200 mg by mouth every 6 (six) hours as needed for headache or moderate pain.   . mirtazapine (REMERON) 15 MG tablet Take 15 mg by mouth at bedtime.  . ondansetron (ZOFRAN ODT) 8 MG disintegrating tablet Take 1 tablet (8 mg total) by mouth every 8 (eight) hours as needed for nausea or vomiting.  . pantoprazole (PROTONIX) 20 MG tablet Take 20 mg by mouth daily.  . pregabalin (LYRICA) 75 MG capsule Take 75 mg by mouth 2 (two) times daily.  [name] tiotropium (SPIRIVA) 18 MCG inhalation capsule Place 18 mcg into inhaler and inhale daily.  [name] warfarin (COUMADIN) 2.5 MG tablet Take 1 tablet (2.5 mg total) by mouth as directed.   No facility-administered encounter medications on file as of 05/30/2016.     Allergies: Aspirin; Codeine; and Oxycodone  Social History   Social History  . Marital status: Married    Spouse name: N/A  . Number of children: N/A  . Years of education: N/A   Occupational History  . Not on file.   Social History Main Topics  . Smoking status: Former Research scientist (life sciences)  . Smokeless tobacco: Former Systems developer  . Alcohol use No  . Drug use: No  . Sexual activity: No   Other Topics Concern  . Not on file   Social History Narrative  . No narrative on file    Family History  Problem Relation Age of Onset  . Heart disease Mother   . Hypertension Father     Review of Systems: A 12-system review of systems was performed and was negative except as noted in the HPI.  --------------------------------------------------------------------------------------------------  Physical Exam: BP 128/78 (BP Location: Right Arm, Patient Position: Sitting, Cuff Size: Normal)   Pulse 82   Ht '5\' 1"'$  (1.549 m)   Wt 154 lb 12 oz (70.2 kg)   BMI 29.24 kg/m   General:  Overweight but frail-appearing elderly man, seated  in the exam room. He is accompanied by his daughter. HEENT: No conjunctival pallor or scleral icterus.  Moist mucous membranes.  OP clear. Neck: Supple without lymphadenopathy, thyromegaly, JVD, or HJR. Lungs: Normal work of breathing.  Mildly diminished breath sounds throughout without wheezes or crackles. Heart: Irregularly irregular rhythm without murmurs or rubs.  Non-displaced PMI. Abd: Bowel sounds present.  Soft, NT/ND without hepatosplenomegaly Ext: 1+ ankle edema bilaterally.  Radial, PT, and DP pulses are 2+ bilaterally. Skin: There are multiple vesicles and bullae on the soles of both feet, more pronounced on the right. There are some vesicles and areas of bloody crusting between the toes, predominantly on the left foot.  EKG: Atrial flutter with variable block versus coarse atrial fibrillation. Ventricular rate is 78 bpm. Nonspecific ST segment changes.  Lab Results  Component Value Date   WBC 7.4 03/28/2016   HGB 14.6 03/28/2016   HCT 42.4 03/28/2016   MCV 91.5 03/28/2016   PLT 278 03/28/2016    Lab Results  Component Value Date   NA 138 04/18/2016   K 4.8 04/18/2016   CL 99 04/18/2016   CO2  24 04/18/2016   BUN 20 04/18/2016   CREATININE 1.42 (H) 04/18/2016   GLUCOSE 89 04/18/2016   ALT 14 (L) 03/28/2016    No results found for: CHOL, HDL, LDLCALC, LDLDIRECT, TRIG, CHOLHDL  --------------------------------------------------------------------------------------------------  ASSESSMENT AND PLAN: Persistent atrial fibrillation Mr. Peatross is adequately rate controlled today with his EKG demonstrating coarse atrial fibrillation versus atrial flutter with variable block and a ventricular rate at 78 bpm. He continues to have marked fatigue, which is likely multifactorial, including atrial fibrillation, multiple medications, and deconditioning. If his INR remains therapeutic today, we have agreed to proceed with direct-current cardioversion next week. We have discussed the  procedure and its risks and will tentatively planned for next Wednesday (06/06/16). He is due for INR check today.  Bullae There are bullae on both feet, which have developed over the last week. I am unsure of the etiology, though warfarin skin toxicity is a consideration. Bullous pemphigoid would also need to be considered. We will refer the patient for urgent dermatology evaluation this week. This needs to be completed before we proceed with cardioversion, as if there is suspicion for warfarin toxicity, and alternative anticoagulation strategy will need to be arranged.  Fatigue This has been a chronic problem and is likely multifactorial. As above, we will attempt a rhythm control strategy with DCCV next week for Mr. Mccard's atrial fibrillation. I will check a CBC, CMP, and phenytoin level today, as he has had fatigue in the setting of supratherapeutic phenytoin level in the past.  Lower extremity edema This is likely a combination of venous insufficiency and heart failure with preserved ejection fraction complicated by persistent atrial fibrillation. We will check a complete metabolic panel today and initiate furosemide 20 mg by mouth daily 3 days, followed by 20 mg daily as needed for edema and weight gain. I have encouraged Mr. Farra to continue wearing his compression stockings.  Hyperlipidemia Mr. Higbie is due for annual lipid evaluation. He is currently off statin therapy. We will check a lipid panel today.  Leg pain This could be due to a number of causes. We will defer further workup until after evaluation of his bullae and cardioversion. We may ultimately need arterial Dopplers and/or ABIs to PVD.  Follow-up: Return to clinic in 1 month.  Nelva Bush, MD 05/30/2016 1:44 PM

## 2016-06-06 NOTE — CV Procedure (Signed)
    Cardioversion Note  Derrick RavelSamuel D Yau [id number] 11-Feb-1931  Procedure: DC Cardioversion Indications: Atrial fibrillation  Procedure Details Consent: Obtained Time Out: Verified patient identification, verified procedure, site/side was marked, verified correct patient position, special equipment/implants available, Radiology Safety Procedures followed,  medications/allergies/relevent history reviewed, required imaging and test results available.  Performed  The patient has been on anticoagulation. Due to single INR of 1.9 last week, TEE was performed and did not show evidence of intracardiac thrombus. The patient received IV propofol for sedation.  Synchronous cardioversion was performed at 120 joules x 1.  The cardioversion was successful with restoration of normal sinus rhythm.   Complications: No apparent complications Patient did tolerate procedure well.   Yvonne Kendallhristopher Kimari Coudriet., MD 06/06/2016, 7:46 AM

## 2016-06-06 NOTE — Anesthesia Postprocedure Evaluation (Signed)
Anesthesia Post Note  Patient: Derrick RavelSamuel D Palleschi  Procedure(s) Performed: Procedure(s) (LRB): TRANSESOPHAGEAL ECHOCARDIOGRAM (TEE) (N/A) CARDIOVERSION (N/A)  Patient location during evaluation: Radiology Anesthesia Type: General Level of consciousness: patient cooperative, awake and oriented Pain management: pain level controlled Vital Signs Assessment: post-procedure vital signs reviewed and stable Respiratory status: spontaneous breathing, nonlabored ventilation, respiratory function stable and patient connected to nasal cannula oxygen Cardiovascular status: blood pressure returned to baseline and stable Postop Assessment: no signs of nausea or vomiting Anesthetic complications: no     Last Vitals:  Vitals:   06/06/16 0748 06/06/16 0750  BP: (P) 104/67 104/76  Pulse:  77  Resp:    Temp: (!) (P) 36 C (!) 36 C    Last Pain:  Vitals:   06/06/16 0750  TempSrc: Axillary                 Michaele OfferSavage,  Camisha Srey A

## 2016-06-06 NOTE — Progress Notes (Signed)
Dr. Okey DupreEnd in to speak with patient and family. States to begin vytorin in 3 days. Family aware to call office if BP continues to be elevated after today.

## 2016-06-06 NOTE — Interval H&P Note (Signed)
History and Physical Interval Note:  06/06/2016 7:16 AM  Zachary Peterson  has presented today for surgery, with the diagnosis atrial fibrillation.  The various methods of treatment have been discussed with the patient and family. After consideration of risks, benefits and other options for treatment, the patient has consented to  Procedure(s): TRANSESOPHAGEAL ECHOCARDIOGRAM (TEE) (N/A) CARDIOVERSION (N/A) as a surgical intervention .  The patient's history has been reviewed, patient examined, no change in status, stable for surgery.  Due to INR of 1.9 last week, we will perform TEE to exclude intracardiac thrombus, followed by cardioversion. I have reviewed the patient's chart and labs.  Questions were answered to the patient's satisfaction.     Talina Pleitez

## 2016-06-06 NOTE — Progress Notes (Signed)
*  PRELIMINARY RESULTS* Echocardiogram Echocardiogram Transesophageal has been performed.  Cristela BlueHege, Sugey Trevathan 06/06/2016, 7:55 AM

## 2016-06-06 NOTE — CV Procedure (Signed)
    Transesophageal Echocardiogram Note  Zachary Peterson [id number] July 25, 1931  Procedure: Transesophageal Echocardiogram Indications: Atrial fibrillation  Procedure Details Consent: Obtained Time Out: Verified patient identification, verified procedure, site/side was marked, verified correct patient position, special equipment/implants available, Radiology Safety Procedures followed,  medications/allergies/relevent history reviewed, required imaging and test results available.  Performed  Medications:  The patient was monitored and sedated with propofol by anesthesia.  Left Ventrical:  Normal size. Mildly reduced contraction.  Mitral Valve: Mild regurgitation  Aortic Valve: Trileaflet with trivial regurgitation.  Tricuspid Valve: Moderate to severe regurgitation  Pulmonic Valve: Grossly normal  Left Atrium/ Left atrial appendage: No thrombus  Atrial septum: Intact by color Doppler  Aorta: Mild atherosclerosis   Complications: No apparent complications Patient did tolerate procedure well.   Yvonne Kendallhristopher Ariya Bohannon, MD 06/06/2016, 7:44 AM

## 2016-06-06 NOTE — Anesthesia Post-op Follow-up Note (Cosign Needed)
Anesthesia QCDR form completed.        

## 2016-06-06 NOTE — Anesthesia Preprocedure Evaluation (Signed)
Anesthesia Evaluation  Patient identified by MRN, date of birth, ID band Patient awake    Reviewed: Allergy & Precautions, H&P , NPO status , Patient's Chart, lab work & pertinent test results, reviewed documented beta blocker date and time   Airway Mallampati: II   Neck ROM: full    Dental  (+) Poor Dentition   Pulmonary neg pulmonary ROS, COPD,  COPD inhaler, former smoker,    Pulmonary exam normal        Cardiovascular Exercise Tolerance: Poor hypertension, Pt. on medications negative cardio ROS Normal cardiovascular exam Rhythm:regular Rate:Normal     Neuro/Psych  Headaches, Seizures -, Well Controlled,  PSYCHIATRIC DISORDERS Depression negative neurological ROS  negative psych ROS   GI/Hepatic negative GI ROS, Neg liver ROS,   Endo/Other  negative endocrine ROS  Renal/GU negative Renal ROS  negative genitourinary   Musculoskeletal   Abdominal   Peds  Hematology negative hematology ROS (+)   Anesthesia Other Findings Past Medical History: No date: Back pain No date: COPD (chronic obstructive pulmonary disease) (* No date: Depression No date: Hearing loss 1976: History of GI bleed No date: Hyperlipemia No date: Hypertension No date: Migraine No date: PAF (paroxysmal atrial fibrillation) (HCC)     Comment: a. 02/2016 Echo: EF 66-65%, mild MR;  b.               [id number] = 3. No OAC 2/2 h/o seizures and               falls-->therefor initial attempt @ rate control              (02/2016). No date: Palpitations No date: Seizure (HCC) No date: Syncope     Comment: a. related to seizure disorder Past Surgical History: No date: APPENDECTOMY No date: BACK SURGERY No date: CATARACT EXTRACTION No date: hemmoridectomy No date: HERNIA REPAIR BMI    Body Mass Index:  24.43 kg/m     Reproductive/Obstetrics negative OB ROS                             Anesthesia Physical Anesthesia  Plan  ASA: IV  Anesthesia Plan: General   Post-op Pain Management:    Induction:   Airway Management Planned:   Additional Equipment:   Intra-op Plan:   Post-operative Plan:   Informed Consent: I have reviewed the patients History and Physical, chart, labs and discussed the procedure including the risks, benefits and alternatives for the proposed anesthesia with the patient or authorized representative who has indicated his/her understanding and acceptance.   Dental Advisory Given  Plan Discussed with: CRNA  Anesthesia Plan Comments:         Anesthesia Quick Evaluation

## 2016-06-13 ENCOUNTER — Ambulatory Visit (INDEPENDENT_AMBULATORY_CARE_PROVIDER_SITE_OTHER): Payer: Self-pay

## 2016-06-13 DIAGNOSIS — I4891 Unspecified atrial fibrillation: Secondary | ICD-10-CM

## 2016-06-13 DIAGNOSIS — Z5181 Encounter for therapeutic drug level monitoring: Secondary | ICD-10-CM

## 2016-06-13 LAB — POCT INR: INR: 1.4

## 2016-06-20 ENCOUNTER — Ambulatory Visit (INDEPENDENT_AMBULATORY_CARE_PROVIDER_SITE_OTHER): Payer: Medicare HMO | Admitting: Cardiovascular Disease

## 2016-06-20 DIAGNOSIS — Z5181 Encounter for therapeutic drug level monitoring: Secondary | ICD-10-CM

## 2016-06-20 DIAGNOSIS — I4891 Unspecified atrial fibrillation: Secondary | ICD-10-CM

## 2016-06-20 LAB — POCT INR: INR: 1.6

## 2016-06-26 ENCOUNTER — Other Ambulatory Visit: Payer: Self-pay | Admitting: Nurse Practitioner

## 2016-06-26 ENCOUNTER — Encounter: Payer: Self-pay | Admitting: Nurse Practitioner

## 2016-06-26 ENCOUNTER — Other Ambulatory Visit: Payer: Self-pay | Admitting: *Deleted

## 2016-06-26 ENCOUNTER — Telehealth: Payer: Self-pay | Admitting: *Deleted

## 2016-06-26 ENCOUNTER — Ambulatory Visit (INDEPENDENT_AMBULATORY_CARE_PROVIDER_SITE_OTHER): Payer: Medicare HMO | Admitting: *Deleted

## 2016-06-26 ENCOUNTER — Ambulatory Visit (INDEPENDENT_AMBULATORY_CARE_PROVIDER_SITE_OTHER): Payer: Medicare HMO | Admitting: Nurse Practitioner

## 2016-06-26 VITALS — BP 148/60 | HR 72 | Ht 67.0 in | Wt 151.5 lb

## 2016-06-26 DIAGNOSIS — I10 Essential (primary) hypertension: Secondary | ICD-10-CM | POA: Diagnosis not present

## 2016-06-26 DIAGNOSIS — Z5181 Encounter for therapeutic drug level monitoring: Secondary | ICD-10-CM

## 2016-06-26 DIAGNOSIS — I481 Persistent atrial fibrillation: Secondary | ICD-10-CM | POA: Diagnosis not present

## 2016-06-26 DIAGNOSIS — I4891 Unspecified atrial fibrillation: Secondary | ICD-10-CM

## 2016-06-26 DIAGNOSIS — E782 Mixed hyperlipidemia: Secondary | ICD-10-CM

## 2016-06-26 DIAGNOSIS — I4819 Other persistent atrial fibrillation: Secondary | ICD-10-CM

## 2016-06-26 LAB — POCT INR: INR: 1.4

## 2016-06-26 NOTE — Telephone Encounter (Signed)
[facility] for [facility] Health management [facility] along with patient demographics, insurance and last office visit note.

## 2016-06-26 NOTE — Patient Instructions (Signed)
Medication Instructions:  Your physician recommends that you continue on your current medications as directed. Please refer to the Current Medication list given to you today.   Labwork: none  Testing/Procedures: none  Follow-Up: Your physician recommends that you schedule a follow-up appointment in: 2 MONTHS WITH DR END.  We will be in the process of seeing if Home Health can check your INR at home.   If you need a refill on your cardiac medications before your next appointment, please call your pharmacy.

## 2016-06-26 NOTE — Progress Notes (Signed)
Office Visit    Patient Name: Zachary Peterson Date of Encounter: 06/26/2016  Primary Care Provider:  Ignacia Palma., MD Primary Cardiologist:  Wallace Cullens, MD   Chief Complaint    83 year old male with a history of atrial fibrillation, hypertension, hyperlipidemia, seizure disorder, and remote GI bleed who presents for follow-up after recent cardioversion.  Past Medical History    Past Medical History:  Diagnosis Date  . Back pain   . COPD (chronic obstructive pulmonary disease) (HCC)   . Depression   . Fatigue    a. in setting of Afib, improved following DCCV.  [name] Hearing loss   . History of GI bleed 1976  . Hyperlipemia   . Hypertension   . Migraine   . Palpitations   . Persistent atrial fibrillation (HCC)    a. 02/2016 Echo: EF 66-65%, mild MR;  b. CHA2DS2VASc = 3-->coumadin;  c. 05/2016 TEE/DCCV: EF 45-50%, triv AI, mild to mod MR, no LA/LAA/RA/RAA thrombus, mod to sev TR-->successful DCCV.  . Seizure (HCC)   . Syncope    a. related to seizure disorder   Past Surgical History:  Procedure Laterality Date  . APPENDECTOMY    . BACK SURGERY    . CARDIOVERSION N/A 06/06/2016   Procedure: CARDIOVERSION;  Surgeon: Yvonne Kendall, MD;  Location: [facility] ORS;  Service: Cardiovascular;  Laterality: N/A;  . CATARACT EXTRACTION    . hemmoridectomy    . HERNIA REPAIR    . TEE WITHOUT CARDIOVERSION N/A 06/06/2016   Procedure: TRANSESOPHAGEAL ECHOCARDIOGRAM (TEE);  Surgeon: Yvonne Kendall, MD;  Location: [facility] ORS;  Service: Cardiovascular;  Laterality: N/A;    Allergies  Allergies  Allergen Reactions  . Aspirin Other (See Comments)    CAUSES ULCERS  . Codeine Nausea And Vomiting  . Oxycodone Nausea And Vomiting    History of Present Illness    83 year old male with the above complex past medical history Including hypertension, hyperlipidemia, persistent atrial fibrillation, COPD, and seizure disorder. He was first diagnosed with atrial fibrillation in February 2018 in the  setting of norovirus. He was not initially anticoagulated in the setting of seizure disorder and prior history of GI bleed. Rate control medications were initiated. Unfortunately, he had significant fatigue following discharge. We initially stopped his beta blocker and placed on diltiazem therapy. Unfortunately fatigue persisted. Anticoagulation was started as he had not had seizures in several years and GI bleed was in 1976. He tolerated Coumadin but continued to have fatigue in the setting of ongoing atrial fibrillation and after his last visit with Dr. Okey Dupre in May, decision was made to pursue TEE and cardioversion. This was successfully performed on May 30. Since his cardioversion, he has felt much better. He has had complete resolution of fatigue. He does have some dyspnea with more prolonged  exertion or higher levels of activity but overall, he notes significant improvement by restoration of sinus rhythm. He denies chest pain, palpitations, PND, orthopnea, dizziness, syncope, edema, or early satiety.   Home Medications    Prior to Admission medications   Medication Sig Start Date End Date Taking? Authorizing Provider  bisoprolol (ZEBETA) 5 MG tablet Take 0.5 tablets (2.5 mg total) by mouth 2 (two) times daily. 05/11/16  Yes End, Cristal Deer, MD  calcium carbonate (TUMS - DOSED IN MG ELEMENTAL CALCIUM) 500 MG chewable tablet Chew 1 tablet (200 mg of elemental calcium total) by mouth 3 (three) times daily with meals as needed for indigestion or heartburn. 02/24/16  Yes Gouru, Deanna Artis,  MD  diazepam (VALIUM) 5 MG tablet Take 5 mg by mouth 2 (two) times daily.    Yes [provider]  DILANTIN 100 MG ER capsule Take 100mg  by mouth 3 times daily then the next day take 100mg s by mouth 2 times daily,alternating tid and bid every other day 04/12/14  Yes [provider]  ezetimibe-simvastatin (VYTORIN) 10-80 MG tablet Take 1 tablet by mouth daily.   Yes [provider]  fluticasone (FLONASE)  50 MCG/ACT nasal spray Place 2 sprays into both nostrils daily as needed for allergies.    Yes [provider]  furosemide (LASIX) 20 MG tablet Take 1 tablet (20 mg total) by mouth daily as needed. Patient taking differently: Take 20 mg by mouth daily.  05/30/16 08/28/16 Yes End, Cristal Deer, MD  HYDROcodone-acetaminophen (NORCO) 5-325 MG per tablet Take 1 tablet by mouth every 6 (six) hours as needed for moderate pain. 04/13/14  Yes Kirichenko, Tatyana, PA-C  hydroxypropyl methylcellulose / hypromellose (ISOPTO TEARS / GONIOVISC) 2.5 % ophthalmic solution Place 1 drop into both eyes 3 (three) times daily as needed for dry eyes.   Yes [provider]  ibuprofen (ADVIL,MOTRIN) 200 MG tablet Take 200 mg by mouth every 6 (six) hours as needed for headache or moderate pain.    Yes [provider]  mirtazapine (REMERON) 15 MG tablet Take 15 mg by mouth at bedtime.   Yes [provider]  ondansetron (ZOFRAN ODT) 8 MG disintegrating tablet Take 1 tablet (8 mg total) by mouth every 8 (eight) hours as needed for nausea or vomiting. 04/13/14  Yes Kirichenko, Tatyana, PA-C  pantoprazole (PROTONIX) 20 MG tablet Take 20 mg by mouth daily.   Yes [provider]  pregabalin (LYRICA) 75 MG capsule Take 75 mg by mouth 2 (two) times daily.   Yes [provider]  tiotropium (SPIRIVA) 18 MCG inhalation capsule Place 18 mcg into inhaler and inhale daily.   Yes [provider]  warfarin (COUMADIN) 2.5 MG tablet Take 1 or 2  Tablet(s) daily or as directed by coumadin clinic 06/05/16  Yes End, Cristal Deer, MD    Review of Systems    Fatigue is much improved. He still has some dyspnea on exertion with higher levels of activity. He denies chest pain, palpitations, PND, orthopnea, dizziness, syncope, edema, or early satiety.  All other systems reviewed and are otherwise negative except as noted above.  Physical Exam    VS:  BP (!) 148/60 (BP Location: Left Arm,  Patient Position: Sitting, Cuff Size: Normal)   Pulse 72   Ht 5\' 7"  (1.702 m)   Wt 151 lb 8 oz (68.7 kg)   BMI 23.73 kg/m  , BMI Body mass index is 23.73 kg/m. GEN: Well nourished, well developed, in no acute distress.  HEENT: normal.  Neck: Supple, no JVD, carotid bruits, or masses. Cardiac: RRR, 2/6 systolic murmur at left lower sternal border to apex., rubs, or gallops. No clubbing, cyanosis, edema.  Radials/DP/PT 2+ and equal bilaterally.  Respiratory:  Respirations regular and unlabored, clear to auscultation bilaterally. GI: Soft, nontender, nondistended, BS + x 4. MS: no deformity or atrophy. Skin: warm and dry, no rash. Neuro:  Strength and sensation are intact. Psych: Normal affect.  Accessory Clinical Findings    ECG - Regular sinus rhythm, 72, right ventricular conduction delay, left atrial enlargement, no acute changes.  Assessment & Plan    1.  Persistent atrial fibrillation: Patient diagnosed with atrial fibrillation in the setting of GI  illness in February 2018 with persistent fatigue while in atrial fibrillation over the last 3 months. He is now status post TEE and cardioversion and is maintaining sinus rhythm today. He has been feeling much better with significant improvement in fatigue. His INRs have been subtherapeutic since cardioversion. Coumadin dose was adjusted on June 13. We will follow-up an INR today. His daughter also asks if we can reinstitute home health to follow his INRs at home as leaving his home does exacerbate symptoms of mild dyspnea on exertion to some extent. Continue beta blocker therapy.  2. Essential hypertension: Blood pressure is elevated today. He does check his blood pressure daily at home and notes that this typically in the 120s. Advised him to continue to follow his blood pressures at home and notify us if he begins to trend in the 130s or above, at which point we would adjust antihypertensive therapy.  3. Hyperlipidemia: LDL was 204 on May  23. He was not taking his Vytorin at that point. This has since been resumed.   4. Seizure disorder: No seizures in over 2 years. He is controlled on Dilantin therapy.  5. Disposition: Follow-up INR today. We will look into resuming home health for INR checks. Plan on office follow-up in 2 months.   Nicolasa Duckinghristopher Emilyrose Darrah, NP 06/26/2016, 12:36 PM

## 2016-06-28 NOTE — Telephone Encounter (Signed)
[facility] They did receive referral via fax and have a start date of 07/04/16 for patient.

## 2016-06-28 NOTE — Telephone Encounter (Signed)
S/w patient to let him know [facility] to set up home health visits for INR.  He verbalized understanding and will also let his daughter, Agustin CreeDarlene, know as well.

## 2016-07-04 ENCOUNTER — Ambulatory Visit (INDEPENDENT_AMBULATORY_CARE_PROVIDER_SITE_OTHER): Payer: Medicare HMO

## 2016-07-04 DIAGNOSIS — Z5181 Encounter for therapeutic drug level monitoring: Secondary | ICD-10-CM | POA: Diagnosis not present

## 2016-07-04 DIAGNOSIS — I4891 Unspecified atrial fibrillation: Secondary | ICD-10-CM

## 2016-07-04 LAB — POCT INR: INR: 1.4

## 2016-07-05 ENCOUNTER — Telehealth: Payer: Self-pay | Admitting: Internal Medicine

## 2016-07-05 NOTE — Telephone Encounter (Signed)
Zachary Peterson with home health stating they received the order to do an INR check Insurance won't pay for it  Unless they have another skill needed on patient She stated they have options for patient to do anything from  Cardiac monitoring or we can place an order for Medication management  Just another order needs to be sent along with the INR for insurance to cover it  Please call back

## 2016-07-05 NOTE — Telephone Encounter (Signed)
Returned call to [place] at [facility]. Medicare will not cover just INR checks. Patient will need an additional service such as cardiac monitoring or medication management along with INR check to qualify.  They offer a Cardio-pulmonary program if provider agrees to cardiac monitoring plus INR check at home.  Will route to Ward Givenshris Berge, NP for advice.

## 2016-07-08 NOTE — Telephone Encounter (Signed)
What does cardiopulm program/cardiac monitoring entail.  I'm not sure that he would require such a program.

## 2016-07-12 ENCOUNTER — Ambulatory Visit (INDEPENDENT_AMBULATORY_CARE_PROVIDER_SITE_OTHER): Payer: Medicare HMO | Admitting: *Deleted

## 2016-07-12 DIAGNOSIS — Z5181 Encounter for therapeutic drug level monitoring: Secondary | ICD-10-CM

## 2016-07-12 DIAGNOSIS — I4891 Unspecified atrial fibrillation: Secondary | ICD-10-CM | POA: Diagnosis not present

## 2016-07-12 LAB — POCT INR: INR: 1.7

## 2016-07-12 NOTE — Telephone Encounter (Signed)
S/w [place] at [facility]. Cardiac monitoring/cardiopulmonary program benefits CHF and COPD patients.  This TeleHealth program provides the patient with an Ipad, BP cuff, SPo2 monitor and a set of scales.Downloads info into Ipad which transmits to their office where a team of clinicians monitor and contact pt if changes. I do not see where patient has a history of either.  Routed to Ward Givenshris Berge, NP

## 2016-07-12 NOTE — Telephone Encounter (Signed)
Left detailed voicemail message that patient does not qualify for those programs at this time and that he should try to keep scheduled upcoming INR check on 07/18/16 at 2:15 PM here in our office with instructions to please call back if any further questions.

## 2016-07-12 NOTE — Telephone Encounter (Signed)
I am covering Zachary Peterson's in box. I don't see where he would qualify or needs this at this time. Please make sure he is continuing to get INR checks with our [facility] with [facility] primary [facility].

## 2016-07-13 ENCOUNTER — Other Ambulatory Visit: Payer: Self-pay | Admitting: *Deleted

## 2016-07-13 MED ORDER — WARFARIN SODIUM 2.5 MG PO TABS: ORAL_TABLET | ORAL | 1 refills | Status: DC

## 2016-07-18 ENCOUNTER — Ambulatory Visit (INDEPENDENT_AMBULATORY_CARE_PROVIDER_SITE_OTHER): Payer: Medicare HMO | Admitting: *Deleted

## 2016-07-18 DIAGNOSIS — Z5181 Encounter for therapeutic drug level monitoring: Secondary | ICD-10-CM | POA: Diagnosis not present

## 2016-07-18 DIAGNOSIS — I4891 Unspecified atrial fibrillation: Secondary | ICD-10-CM | POA: Diagnosis not present

## 2016-07-18 LAB — POCT INR: INR: 1.7

## 2016-07-25 ENCOUNTER — Ambulatory Visit (INDEPENDENT_AMBULATORY_CARE_PROVIDER_SITE_OTHER): Payer: Medicare HMO | Admitting: *Deleted

## 2016-07-25 DIAGNOSIS — Z5181 Encounter for therapeutic drug level monitoring: Secondary | ICD-10-CM

## 2016-07-25 DIAGNOSIS — I4891 Unspecified atrial fibrillation: Secondary | ICD-10-CM

## 2016-07-25 LAB — POCT INR: INR: 2.1

## 2016-08-08 ENCOUNTER — Ambulatory Visit (INDEPENDENT_AMBULATORY_CARE_PROVIDER_SITE_OTHER): Payer: Medicare HMO

## 2016-08-08 DIAGNOSIS — Z5181 Encounter for therapeutic drug level monitoring: Secondary | ICD-10-CM | POA: Diagnosis not present

## 2016-08-08 DIAGNOSIS — I4891 Unspecified atrial fibrillation: Secondary | ICD-10-CM

## 2016-08-08 LAB — POCT INR: INR: 2.7

## 2016-08-29 ENCOUNTER — Encounter: Payer: Self-pay | Admitting: Internal Medicine

## 2016-08-29 ENCOUNTER — Ambulatory Visit: Admission: RE | Admit: 2016-08-29 | Payer: Medicare HMO | Source: Ambulatory Visit | Admitting: Internal Medicine

## 2016-08-29 ENCOUNTER — Ambulatory Visit (INDEPENDENT_AMBULATORY_CARE_PROVIDER_SITE_OTHER): Payer: Medicare HMO | Admitting: *Deleted

## 2016-08-29 ENCOUNTER — Ambulatory Visit (INDEPENDENT_AMBULATORY_CARE_PROVIDER_SITE_OTHER): Payer: Medicare HMO | Admitting: Internal Medicine

## 2016-08-29 VITALS — BP 150/70 | HR 79 | Ht 67.5 in | Wt 153.5 lb

## 2016-08-29 DIAGNOSIS — I4891 Unspecified atrial fibrillation: Secondary | ICD-10-CM | POA: Diagnosis not present

## 2016-08-29 DIAGNOSIS — I10 Essential (primary) hypertension: Secondary | ICD-10-CM | POA: Diagnosis not present

## 2016-08-29 DIAGNOSIS — Z5181 Encounter for therapeutic drug level monitoring: Secondary | ICD-10-CM | POA: Diagnosis not present

## 2016-08-29 DIAGNOSIS — I5030 Unspecified diastolic (congestive) heart failure: Secondary | ICD-10-CM

## 2016-08-29 DIAGNOSIS — I4819 Other persistent atrial fibrillation: Secondary | ICD-10-CM

## 2016-08-29 DIAGNOSIS — I481 Persistent atrial fibrillation: Secondary | ICD-10-CM

## 2016-08-29 DIAGNOSIS — S99912A Unspecified injury of left ankle, initial encounter: Secondary | ICD-10-CM | POA: Diagnosis not present

## 2016-08-29 LAB — POCT INR: INR: 2.9

## 2016-08-29 NOTE — Progress Notes (Addendum)
Follow-up Outpatient Visit Date: 08/29/2016  Primary Care Provider: [place] [address]  [address] [place] [address]  Chief Complaint: left ankle pain  HPI:  Zachary Peterson is a 83 y.o. year-old male with history of persistent atrial fibrillation status post successful DCCV, hypertension, hyperlipidemia, COPD, and seizure disorder, who presents for follow-up of atrial fibrillation. I last saw him in May, which time we proceeded with TEE guided cardioversion. Since restoration of sinus rhythm, Zachary Peterson has felt much better with improved energy. Unfortunately, he twisted his left ankle 5 days ago and has not been able to do much on account of this. It remains quite painful and has swollen more over the last 1-2 days despite applying ice and elevating the extremity. He is able to bear weight on it, but notes considerable pain when trying to rotate the foot/ankle.  Zachary Peterson has not had any chest pain, shortness of breath, palpitations, or lightheadedness. He is tolerating his current medications well, including warfarin. His INR has been therapeutic on the last few checks. He has not had any significant bleeding. His daughter notes that Zachary Peterson's energy has seemed much better and he is back to doing a lot of his prior activities following cardioversion. Home blood pressure is typically well controlled around 120/60. Weight has been stable. Zachary Peterson has not needed to use any as needed Lasix recently.  --------------------------------------------------------------------------------------------------  Cardiovascular History & Procedures: Cardiovascular Problems:  Persistent atrial fibrillation  Heart failure with preserved ejection fraction  Risk Factors:  Hypertension, male gender, and age greater than 7  Cath/PCI:  None  CV Surgery:  None  EP Procedures and Devices:  Cardioversion (06/06/16)  Non-Invasive Evaluation(s):  TEE (06/06/16): mildly reduced LV  contraction (EF 45-50%). Trivial AI. Mild to moderate MR. No left atrial appendage or cavity thrombus. No right atrial thrombus. No intra-atrial shunt. Moderate to severe TR.  TTE (02/21/16): Normal LV size and wall thickness with LVEF of 55-65%. Unable to assess diastolic function due to atrial fibrillation. Mild mitral regurgitation. Upper normal RV size with normal contraction. Mildly elevated pulmonary artery pressure.  Recent CV Pertinent Labs: Lab Results  Component Value Date   CHOL 271 (H) 05/30/2016   HDL 38 (L) 05/30/2016   LDLCALC 204 (H) 05/30/2016   TRIG 147 05/30/2016   CHOLHDL 7.1 (H) 05/30/2016   INR 2.9 08/29/2016   INR 2.46 06/06/2016   K 4.4 05/30/2016   K 3.2 (L) 11/23/2013   MG 2.1 03/28/2016   BUN 14 05/30/2016   BUN 17 11/23/2013   CREATININE 1.27 05/30/2016   CREATININE 1.16 11/23/2013    Past medical and surgical history were reviewed and updated in EPIC.  Current Meds  Medication Sig  . bisoprolol (ZEBETA) 5 MG tablet Take 0.5 tablets (2.5 mg total) by mouth 2 (two) times daily.  . calcium carbonate (TUMS - DOSED IN MG ELEMENTAL CALCIUM) 500 MG chewable tablet Chew 1 tablet (200 mg of elemental calcium total) by mouth 3 (three) times daily with meals as needed for indigestion or heartburn.  . diazepam (VALIUM) 5 MG tablet Take 5 mg by mouth 2 (two) times daily.   [name] DILANTIN 100 MG ER capsule Take 100mg  by mouth 3 times daily then the next day take 100mg s by mouth 2 times daily,alternating tid and bid every other day  . ezetimibe-simvastatin (VYTORIN) 10-80 MG tablet Take 1 tablet by mouth daily.  . fluticasone (FLONASE) 50 MCG/ACT nasal spray Place 2 sprays  into both nostrils daily as needed for allergies.   . furosemide (LASIX) 20 MG tablet Take 1 tablet (20 mg total) by mouth daily as needed. (Patient taking differently: Take 20 mg by mouth daily. )  . HYDROcodone-acetaminophen (NORCO) 5-325 MG per tablet Take 1 tablet by mouth every 6 (six) hours as  needed for moderate pain.  . hydroxypropyl methylcellulose / hypromellose (ISOPTO TEARS / GONIOVISC) 2.5 % ophthalmic solution Place 1 drop into both eyes 3 (three) times daily as needed for dry eyes.  [name] ibuprofen (ADVIL,MOTRIN) 200 MG tablet Take 200 mg by mouth every 6 (six) hours as needed for headache or moderate pain.   . mirtazapine (REMERON) 15 MG tablet Take 15 mg by mouth at bedtime.  . ondansetron (ZOFRAN ODT) 8 MG disintegrating tablet Take 1 tablet (8 mg total) by mouth every 8 (eight) hours as needed for nausea or vomiting.  . pantoprazole (PROTONIX) 20 MG tablet Take 20 mg by mouth daily.  . pregabalin (LYRICA) 75 MG capsule Take 75 mg by mouth 2 (two) times daily.  [name] tiotropium (SPIRIVA) 18 MCG inhalation capsule Place 18 mcg into inhaler and inhale daily.  [name] warfarin (COUMADIN) 2.5 MG tablet Take 1 or 2  Tablet(s) daily or as directed by coumadin clinic     Allergies: Aspirin; Codeine; and Oxycodone  Social History   Social History  . Marital status: Married    Spouse name: N/A  . Number of children: N/A  . Years of education: N/A   Occupational History  . Not on file.   Social History Main Topics  . Smoking status: Former Games developer  . Smokeless tobacco: Former Neurosurgeon  . Alcohol use No  . Drug use: No  . Sexual activity: No   Other Topics Concern  . Not on file   Social History Narrative  . No narrative on file    Family History  Problem Relation Age of Onset  . Heart disease Mother   . Hypertension Father     Review of Systems: Review of Systems  Constitutional: Positive for malaise/fatigue (improved from prior visits).  HENT: Negative.   Eyes: Negative.   Respiratory: Negative.   Cardiovascular: Negative.   Gastrointestinal: Negative.   Genitourinary: Negative.   Musculoskeletal: Positive for joint pain (left ankle (see HPI)).  Skin: Positive for rash (Recurrent painless blisters on feet, similar to what prompted dermatology evaluation in May).    Neurological: Negative.   Endo/Heme/Allergies: Bruises/bleeds easily.   --------------------------------------------------------------------------------------------------  Physical Exam: BP (!) 150/70 (BP Location: Right Arm, Patient Position: Sitting, Cuff Size: Normal)   Pulse 79   Ht 5' 7.5" (1.715 m)   Wt 153 lb 8 oz (69.6 kg)   BMI 23.69 kg/m   General:  Elderly man, seated comfortably in a wheelchair. He is accompanied by his daughter. HEENT: No conjunctival pallor or scleral icterus.  Moist mucous membranes.  OP clear. Neck: Supple without lymphadenopathy, thyromegaly, JVD, or HJR. Lungs: Normal work of breathing.  Clear to auscultation bilaterally without wheezes or crackles. Heart: Regular rate and rhythm with 2/6 holosystolic murmur loudest at the left lower sternal border.  Non-displaced PMI. Abd: Bowel sounds present.  Soft, NT/ND without hepatosplenomegaly Ext: 1-2+ left calf edema. There is significant tenderness along the leftmedial malleolus and proximal tibia. Radial, PT, and DP pulses are 2+ bilaterally. Skin: Warm and dry.  EKG: Normal sinus rhythm without significant abnormalities.  Lab Results  Component Value Date   WBC 6.1 05/30/2016   HGB 13.9  05/30/2016   HCT 41.1 05/30/2016   MCV 89 05/30/2016   PLT 312 05/30/2016    Lab Results  Component Value Date   NA 139 05/30/2016   K 4.4 05/30/2016   CL 102 05/30/2016   CO2 25 05/30/2016   BUN 14 05/30/2016   CREATININE 1.27 05/30/2016   GLUCOSE 90 05/30/2016   ALT 7 05/30/2016    Lab Results  Component Value Date   CHOL 271 (H) 05/30/2016   HDL 38 (L) 05/30/2016   LDLCALC 204 (H) 05/30/2016   TRIG 147 05/30/2016   CHOLHDL 7.1 (H) 05/30/2016    --------------------------------------------------------------------------------------------------  ASSESSMENT AND PLAN: Persistent atrial fibrillation Zachary Peterson remains in sinus rhythm following cardioversion in May. We will continue with current  dose of bisoprolol as well as indefinite anticoagulation with warfarin (not a candidate for NOAC due to ongoing phenytoin use). CHADSVASC score is at least 4.  Left ankle injury Zachary Peterson reports twisting the left ankle last week while working outside. He had acute pain, which has persisted. Swelling, which is now evident halfway up the left calf, has worsened. I have low suspicion for DVT given therapeutic anticoagulation. I will refer him for orthopedics evaluation later today.  Heart failure with preserved ejection fraction Aside from left ankle and calf swelling in the setting of recent injury, Zachary Peterson appears euvolemic. We will continue with bisoprolol and as needed furosemide.  Hypertension Blood pressure is mildly elevated today. Zachary Peterson reports normal home blood pressure readings. In the setting of pain from recent left ankle injury, we will defer medication changes at this time.  Follow-up: Return to clinic in 3 months.  Yvonne Kendall, MD 08/29/2016 10:51 AM

## 2016-08-29 NOTE — Patient Instructions (Signed)
Medication Instructions:  Your physician recommends that you continue on your current medications as directed. Please refer to the Current Medication list given to you today.   Labwork: none  Testing/Procedures: none  Follow-Up: You have been referred to [facility] for evaluation. Appointment is TODAY, 08/29/16 at 1 pm. Please arrive 15 minutes early and bring your insurance card and ID.  [address] [place], [place] [address]. Phone [phone]    Your physician recommends that you schedule a follow-up appointment in: 3 MONTHS WITH DR END.   If you need a refill on your cardiac medications before your next appointment, please call your pharmacy.

## 2016-09-07 ENCOUNTER — Ambulatory Visit
Admission: RE | Admit: 2016-09-07 | Discharge: 2016-09-07 | Disposition: A | Discharge status: Home / Self Care | Payer: Medicare HMO | Source: Ambulatory Visit | Attending: Orthopedic Surgery | Admitting: Orthopedic Surgery

## 2016-09-07 ENCOUNTER — Other Ambulatory Visit: Payer: Self-pay | Admitting: Orthopedic Surgery

## 2016-09-07 DIAGNOSIS — R2242 Localized swelling, mass and lump, left lower limb: Secondary | ICD-10-CM

## 2016-09-25 ENCOUNTER — Other Ambulatory Visit: Payer: Self-pay | Admitting: *Deleted

## 2016-09-25 ENCOUNTER — Other Ambulatory Visit: Payer: Self-pay | Admitting: Internal Medicine

## 2016-09-25 NOTE — Telephone Encounter (Signed)
Refill Request.  

## 2016-09-26 ENCOUNTER — Ambulatory Visit (INDEPENDENT_AMBULATORY_CARE_PROVIDER_SITE_OTHER): Payer: Medicare HMO

## 2016-09-26 DIAGNOSIS — I4891 Unspecified atrial fibrillation: Secondary | ICD-10-CM | POA: Diagnosis not present

## 2016-09-26 DIAGNOSIS — Z5181 Encounter for therapeutic drug level monitoring: Secondary | ICD-10-CM | POA: Diagnosis not present

## 2016-09-26 LAB — POCT INR: INR: 3.5

## 2016-09-27 MED ORDER — WARFARIN SODIUM 2.5 MG PO TABS: ORAL_TABLET | ORAL | 2 refills | Status: DC

## 2016-10-17 ENCOUNTER — Ambulatory Visit (INDEPENDENT_AMBULATORY_CARE_PROVIDER_SITE_OTHER): Payer: Medicare HMO

## 2016-10-17 DIAGNOSIS — I4891 Unspecified atrial fibrillation: Secondary | ICD-10-CM | POA: Diagnosis not present

## 2016-10-17 DIAGNOSIS — Z5181 Encounter for therapeutic drug level monitoring: Secondary | ICD-10-CM | POA: Diagnosis not present

## 2016-10-17 LAB — POCT INR: INR: 3.2

## 2016-11-07 ENCOUNTER — Ambulatory Visit (INDEPENDENT_AMBULATORY_CARE_PROVIDER_SITE_OTHER): Payer: Medicare HMO

## 2016-11-07 DIAGNOSIS — I4891 Unspecified atrial fibrillation: Secondary | ICD-10-CM

## 2016-11-07 DIAGNOSIS — Z5181 Encounter for therapeutic drug level monitoring: Secondary | ICD-10-CM

## 2016-11-07 LAB — POCT INR: INR: 2.6

## 2016-12-04 ENCOUNTER — Ambulatory Visit (INDEPENDENT_AMBULATORY_CARE_PROVIDER_SITE_OTHER): Payer: Medicare HMO

## 2016-12-04 ENCOUNTER — Encounter: Payer: Self-pay | Admitting: Internal Medicine

## 2016-12-04 ENCOUNTER — Ambulatory Visit (INDEPENDENT_AMBULATORY_CARE_PROVIDER_SITE_OTHER): Payer: Medicare HMO | Admitting: Internal Medicine

## 2016-12-04 VITALS — BP 136/68 | HR 77 | Ht 67.5 in | Wt 154.0 lb

## 2016-12-04 DIAGNOSIS — Z5181 Encounter for therapeutic drug level monitoring: Secondary | ICD-10-CM

## 2016-12-04 DIAGNOSIS — I5030 Unspecified diastolic (congestive) heart failure: Secondary | ICD-10-CM

## 2016-12-04 DIAGNOSIS — E782 Mixed hyperlipidemia: Secondary | ICD-10-CM | POA: Diagnosis not present

## 2016-12-04 DIAGNOSIS — I4819 Other persistent atrial fibrillation: Secondary | ICD-10-CM

## 2016-12-04 DIAGNOSIS — I10 Essential (primary) hypertension: Secondary | ICD-10-CM

## 2016-12-04 DIAGNOSIS — I4891 Unspecified atrial fibrillation: Secondary | ICD-10-CM | POA: Diagnosis not present

## 2016-12-04 DIAGNOSIS — I481 Persistent atrial fibrillation: Secondary | ICD-10-CM | POA: Diagnosis not present

## 2016-12-04 LAB — POCT INR: INR: 3

## 2016-12-04 NOTE — Patient Instructions (Signed)
Continue on same dosage 2 tablets everyday.  Recheck in 5 weeks.  Pt on Dilantin

## 2016-12-04 NOTE — Patient Instructions (Signed)
Medication Instructions:  Your physician recommends that you continue on your current medications as directed. Please refer to the Current Medication list given to you today.   Labwork: Your physician recommends that you return for lab work  THE MORNING OF YOUR NEXT COUMADIN CHECK. - You will need to be fasting. DO NOT EAT OR DRINK after midnight themorning of the lab work.   Testing/Procedures: none  Follow-Up: Your physician wants you to follow-up in: 6 MONTHS WITH DR END. You will receive a reminder letter in the mail two months in advance. If you don't receive a letter, please call our office to schedule the follow-up appointment.    If you need a refill on your cardiac medications before your next appointment, please call your pharmacy.

## 2016-12-04 NOTE — Progress Notes (Signed)
Follow-up Outpatient Visit Date: 12/04/2016  Primary Care Provider: [place] [address]  [address] [place] [address]  Chief Complaint: Fatigue  HPI:  [name] is a 83 y.o. year-old male with history of persistent atrial fibrillation status post successful DCCV, hypertension, hyperlipidemia, COPD, and seizure disorder, who presents for follow-up of atrial fibrillation. I last saw him in August, at which time he was feeling better after restoration of sinus rhythm. He had injured his ankle shortly before our visit and was subsequently evaluated at [facility].  Today, [name] reports that he is doing relatively well. He still has some fatigue, though this is notably better than before his cardioversion. He attributes a lot of this to having been sedentary for almost a year now. He was recently evaluated by a new neurologist and is contemplating switching from phenytoin to an alternative agent for management of his seizure disorder, given concern that phenytoin may be contributing to his fatigue and balance problems. [name] remains on bisoprolol and warfarin without difficulties. He has not had any bleeding. He denies chest pain, shortness of breath, edema, palpitations, and lightheadedness. He is planning to undergo treatment for ingrown toenails by Dr. Reola Calkins in the next week or two.  --------------------------------------------------------------------------------------------------  Cardiovascular History & Procedures: Cardiovascular Problems:  Persistent atrial fibrillation  Heart failure with preserved ejection fraction  Risk Factors:  Hypertension, male gender, and age greater than 18  Cath/PCI:  None  CV Surgery:  None  EP Procedures and Devices:  Cardioversion (06/06/16)  Non-Invasive Evaluation(s):  TEE (06/06/16): mildly reduced LV contraction (EF 45-50%). Trivial AI. Mild to moderate MR. No left atrial appendage or cavity  thrombus. No right atrial thrombus. No intra-atrial shunt. Moderate to severe TR.  TTE (02/21/16): Normal LV size and wall thickness with LVEF of 55-65%. Unable to assess diastolic function due to atrial fibrillation. Mild mitral regurgitation. Upper normal RV size with normal contraction. Mildly elevated pulmonary artery pressure.  Recent CV Pertinent Labs: Lab Results  Component Value Date   CHOL 271 (H) 05/30/2016   HDL 38 (L) 05/30/2016   LDLCALC 204 (H) 05/30/2016   TRIG 147 05/30/2016   CHOLHDL 7.1 (H) 05/30/2016   INR 2.6 11/07/2016   INR 2.46 06/06/2016   K 4.4 05/30/2016   K 3.2 (L) 11/23/2013   MG 2.1 03/28/2016   BUN 14 05/30/2016   BUN 17 11/23/2013   CREATININE 1.27 05/30/2016   CREATININE 1.16 11/23/2013    Past medical and surgical history were reviewed and updated in EPIC.  Current Meds  Medication Sig  . bisoprolol (ZEBETA) 5 MG tablet Take 0.5 tablets (2.5 mg total) by mouth 2 (two) times daily.  . calcium carbonate (TUMS - DOSED IN MG ELEMENTAL CALCIUM) 500 MG chewable tablet Chew 1 tablet (200 mg of elemental calcium total) by mouth 3 (three) times daily with meals as needed for indigestion or heartburn.  . diazepam (VALIUM) 5 MG tablet Take 5 mg by mouth 2 (two) times daily.   [name] DILANTIN 100 MG ER capsule Take 100mg  by mouth 3 times daily then the next day take 100mg s by mouth 2 times daily,alternating tid and bid every other day  . ezetimibe-simvastatin (VYTORIN) 10-80 MG tablet Take 1 tablet by mouth daily.  . fluticasone (FLONASE) 50 MCG/ACT nasal spray Place 2 sprays into both nostrils daily as needed for allergies.   [name] HYDROcodone-acetaminophen (NORCO) 5-325 MG per tablet Take 1 tablet by mouth every 6 (six)  hours as needed for moderate pain.  . hydroxypropyl methylcellulose / hypromellose (ISOPTO TEARS / GONIOVISC) 2.5 % ophthalmic solution Place 1 drop into both eyes 3 (three) times daily as needed for dry eyes.  [name]. ibuprofen (ADVIL,MOTRIN) 200 MG tablet  Take 200 mg by mouth every 6 (six) hours as needed for headache or moderate pain.   . mirtazapine (REMERON) 15 MG tablet Take 15 mg by mouth at bedtime.  . ondansetron (ZOFRAN ODT) 8 MG disintegrating tablet Take 1 tablet (8 mg total) by mouth every 8 (eight) hours as needed for nausea or vomiting.  . pantoprazole (PROTONIX) 20 MG tablet Take 20 mg by mouth daily.  . pregabalin (LYRICA) 75 MG capsule Take 75 mg by mouth 2 (two) times daily.  [name]. tiotropium (SPIRIVA) 18 MCG inhalation capsule Place 18 mcg into inhaler and inhale daily.  [name]. warfarin (COUMADIN) 2.5 MG tablet Take 1 or 2  Tablet(s) daily or as directed by coumadin clinic  . warfarin (COUMADIN) 2.5 MG tablet TAKE 1 OR 2 TABLETS BY MOUTH EVERY DAY OR AS PHYSICIAN INSTRUCTED BY COUMADIN CLINIC    Allergies: Aspirin; Codeine; and Oxycodone  Social History   Socioeconomic History  . Marital status: Married    Spouse name: Not on file  . Number of children: Not on file  . Years of education: Not on file  . Highest education level: Not on file  Social Needs  . Financial resource strain: Not on file  . Food insecurity - worry: Not on file  . Food insecurity - inability: Not on file  . Transportation needs - medical: Not on file  . Transportation needs - non-medical: Not on file  Occupational History  . Not on file  Tobacco Use  . Smoking status: Former Games developermoker  . Smokeless tobacco: Former Engineer, waterUser  Substance and Sexual Activity  . Alcohol use: No  . Drug use: No  . Sexual activity: No  Other Topics Concern  . Not on file  Social History Narrative  . Not on file    Family History  Problem Relation Age of Onset  . Heart disease Mother   . Hypertension Father     Review of Systems: A 12-system review of systems was performed and was negative except as noted in the HPI.  --------------------------------------------------------------------------------------------------  Physical Exam: BP 136/68 (BP Location: Left Arm,  Patient Position: Sitting, Cuff Size: Normal)   Pulse 77   Ht 5' 7.5" (1.715 m)   Wt 154 lb (69.9 kg)   BMI 23.76 kg/m   General:  Thin elderly man, seated comfortably in the exam room. He is accompanied by his daughter. HEENT: No conjunctival pallor or scleral icterus. Moist mucous membranes.  OP clear. Neck: Supple without lymphadenopathy, thyromegaly, JVD, or HJR. Lungs: Normal work of breathing. Clear to auscultation bilaterally without wheezes or crackles. Heart: Regular rate and rhythm without murmurs, rubs, or gallops. Non-displaced PMI. Abd: Bowel sounds present. Soft, NT/ND without hepatosplenomegaly Ext: No lower extremity edema. Radial, PT, and DP pulses are 2+ bilaterally. Skin: Warm and dry without rash.  EKG:  Normal sinus rhythm with rSR' in V1 and V2. Otherwise, no significant abnormalities.  Lab Results  Component Value Date   WBC 6.1 05/30/2016   HGB 13.9 05/30/2016   HCT 41.1 05/30/2016   MCV 89 05/30/2016   PLT 312 05/30/2016    Lab Results  Component Value Date   NA 139 05/30/2016   K 4.4 05/30/2016   CL 102 05/30/2016  CO2 25 05/30/2016   BUN 14 05/30/2016   CREATININE 1.27 05/30/2016   GLUCOSE 90 05/30/2016   ALT 7 05/30/2016    Lab Results  Component Value Date   CHOL 271 (H) 05/30/2016   HDL 38 (L) 05/30/2016   LDLCALC 204 (H) 05/30/2016   TRIG 147 05/30/2016   CHOLHDL 7.1 (H) 05/30/2016    --------------------------------------------------------------------------------------------------  ASSESSMENT AND PLAN: Persistent atrial fibrillation [name] remains in sinus rhythm today. He has not had any symptoms to suggest paroxysmal atrial fibrillation. We will continue current doses of bisoprolol as well as warfarin with target INR of 2-3. He will continue to follow in our anticoagulation clinic.  Heart failure with preserved ejection fraction [name] still complains of decreased energy, though much of this is likely due to  deconditioning, as he was very sedentary while in atrial fibrillation. I have encouraged him to increase his activity. He appears euvolemic on exam today. His weight is up a little bit since August. I will not make any medication changes today.  Hyperlipidemia Most recent lipid panel in May was notable for an LDL of 204. At that time, [name] does not believe that he was on Vytorin. He has been taking this regularly but was recently advised by his insurance that the medication may not be covered next year. His daughter states that [name] did not respond well to other cholesterol medications. We will check a fasting lipid panel when he returns to our office next time for an INR check. Based on these results, we could consider prescribing simvastatin and ezetimibe separately versus discontinuing ezetimibe and working with a statin alone.  Hypertension Blood pressure is upper normal today. We will not make any medication changes at this time.  Follow-up: Return to clinic in 6 months.  Yvonne Kendallhristopher Bernhardt Riemenschneider, MD 12/04/2016 2:05 PM

## 2017-01-09 ENCOUNTER — Other Ambulatory Visit (INDEPENDENT_AMBULATORY_CARE_PROVIDER_SITE_OTHER): Payer: Medicare HMO | Admitting: *Deleted

## 2017-01-09 ENCOUNTER — Ambulatory Visit (INDEPENDENT_AMBULATORY_CARE_PROVIDER_SITE_OTHER): Payer: Medicare HMO

## 2017-01-09 DIAGNOSIS — I481 Persistent atrial fibrillation: Secondary | ICD-10-CM | POA: Diagnosis not present

## 2017-01-09 DIAGNOSIS — I4891 Unspecified atrial fibrillation: Secondary | ICD-10-CM | POA: Diagnosis not present

## 2017-01-09 DIAGNOSIS — I5030 Unspecified diastolic (congestive) heart failure: Secondary | ICD-10-CM

## 2017-01-09 DIAGNOSIS — Z5181 Encounter for therapeutic drug level monitoring: Secondary | ICD-10-CM | POA: Diagnosis not present

## 2017-01-09 DIAGNOSIS — I4819 Other persistent atrial fibrillation: Secondary | ICD-10-CM

## 2017-01-09 LAB — POCT INR: INR: 2.6

## 2017-01-09 NOTE — Patient Instructions (Signed)
Continue on same dosage 2 tablets everyday.  Recheck in 6 weeks.

## 2017-01-10 LAB — LIPID PANEL
Chol/HDL Ratio: 3.7 ratio (ref 0.0–5.0)
Cholesterol, Total: 171 mg/dL (ref 100–199)
HDL: 46 mg/dL (ref >39)
LDL CALC: 97 mg/dL (ref 0–99)
TRIGLYCERIDES: 138 mg/dL (ref 0–149)
VLDL CHOLESTEROL CAL: 28 mg/dL (ref 5–40)

## 2017-01-21 ENCOUNTER — Other Ambulatory Visit: Payer: Self-pay | Admitting: Internal Medicine

## 2017-01-21 NOTE — Telephone Encounter (Signed)
Please review for refill, Thanks !  

## 2017-01-26 ENCOUNTER — Emergency Department (HOSPITAL_COMMUNITY): Payer: Medicare HMO

## 2017-01-26 ENCOUNTER — Emergency Department
Admission: EM | Admit: 2017-01-26 | Discharge: 2017-01-26 | Disposition: A | Discharge status: Home / Self Care | Payer: Medicare HMO | Attending: Emergency Medicine | Admitting: Emergency Medicine

## 2017-01-26 ENCOUNTER — Inpatient Hospital Stay (HOSPITAL_COMMUNITY)
Admission: EM | Admit: 2017-01-26 | Discharge: 2017-02-09 | DRG: 100 | Disposition: A | Discharge status: Skilled Nursing Facility | Payer: Medicare HMO | Attending: Family Medicine | Admitting: Family Medicine

## 2017-01-26 ENCOUNTER — Emergency Department: Payer: Medicare HMO

## 2017-01-26 ENCOUNTER — Encounter (HOSPITAL_COMMUNITY): Payer: Self-pay | Admitting: Emergency Medicine

## 2017-01-26 DIAGNOSIS — E869 Volume depletion, unspecified: Secondary | ICD-10-CM | POA: Diagnosis present

## 2017-01-26 DIAGNOSIS — R1310 Dysphagia, unspecified: Secondary | ICD-10-CM

## 2017-01-26 DIAGNOSIS — Z79899 Other long term (current) drug therapy: Secondary | ICD-10-CM | POA: Insufficient documentation

## 2017-01-26 DIAGNOSIS — I4891 Unspecified atrial fibrillation: Secondary | ICD-10-CM | POA: Insufficient documentation

## 2017-01-26 DIAGNOSIS — G4089 Other seizures: Principal | ICD-10-CM | POA: Diagnosis present

## 2017-01-26 DIAGNOSIS — Z9114 Patient's other noncompliance with medication regimen: Secondary | ICD-10-CM

## 2017-01-26 DIAGNOSIS — I482 Chronic atrial fibrillation: Secondary | ICD-10-CM | POA: Diagnosis present

## 2017-01-26 DIAGNOSIS — T17800A Unspecified foreign body in other parts of respiratory tract causing asphyxiation, initial encounter: Secondary | ICD-10-CM

## 2017-01-26 DIAGNOSIS — R569 Unspecified convulsions: Secondary | ICD-10-CM | POA: Diagnosis not present

## 2017-01-26 DIAGNOSIS — I4519 Other right bundle-branch block: Secondary | ICD-10-CM | POA: Diagnosis present

## 2017-01-26 DIAGNOSIS — Z7901 Long term (current) use of anticoagulants: Secondary | ICD-10-CM

## 2017-01-26 DIAGNOSIS — J9602 Acute respiratory failure with hypercapnia: Secondary | ICD-10-CM

## 2017-01-26 DIAGNOSIS — H9190 Unspecified hearing loss, unspecified ear: Secondary | ICD-10-CM | POA: Diagnosis present

## 2017-01-26 DIAGNOSIS — D62 Acute posthemorrhagic anemia: Secondary | ICD-10-CM

## 2017-01-26 DIAGNOSIS — Z87891 Personal history of nicotine dependence: Secondary | ICD-10-CM | POA: Insufficient documentation

## 2017-01-26 DIAGNOSIS — Z886 Allergy status to analgesic agent status: Secondary | ICD-10-CM

## 2017-01-26 DIAGNOSIS — E876 Hypokalemia: Secondary | ICD-10-CM | POA: Diagnosis not present

## 2017-01-26 DIAGNOSIS — I10 Essential (primary) hypertension: Secondary | ICD-10-CM

## 2017-01-26 DIAGNOSIS — R059 Cough, unspecified: Secondary | ICD-10-CM

## 2017-01-26 DIAGNOSIS — Z885 Allergy status to narcotic agent status: Secondary | ICD-10-CM

## 2017-01-26 DIAGNOSIS — G4700 Insomnia, unspecified: Secondary | ICD-10-CM | POA: Diagnosis not present

## 2017-01-26 DIAGNOSIS — K529 Noninfective gastroenteritis and colitis, unspecified: Secondary | ICD-10-CM | POA: Diagnosis present

## 2017-01-26 DIAGNOSIS — I110 Hypertensive heart disease with heart failure: Secondary | ICD-10-CM | POA: Diagnosis present

## 2017-01-26 DIAGNOSIS — Z978 Presence of other specified devices: Secondary | ICD-10-CM | POA: Diagnosis not present

## 2017-01-26 DIAGNOSIS — R601 Generalized edema: Secondary | ICD-10-CM | POA: Diagnosis not present

## 2017-01-26 DIAGNOSIS — J690 Pneumonitis due to inhalation of food and vomit: Secondary | ICD-10-CM | POA: Diagnosis present

## 2017-01-26 DIAGNOSIS — I5022 Chronic systolic (congestive) heart failure: Secondary | ICD-10-CM | POA: Diagnosis present

## 2017-01-26 DIAGNOSIS — J9601 Acute respiratory failure with hypoxia: Secondary | ICD-10-CM

## 2017-01-26 DIAGNOSIS — Z9911 Dependence on respirator [ventilator] status: Secondary | ICD-10-CM

## 2017-01-26 DIAGNOSIS — I480 Paroxysmal atrial fibrillation: Secondary | ICD-10-CM

## 2017-01-26 DIAGNOSIS — M549 Dorsalgia, unspecified: Secondary | ICD-10-CM | POA: Diagnosis present

## 2017-01-26 DIAGNOSIS — E874 Mixed disorder of acid-base balance: Secondary | ICD-10-CM | POA: Diagnosis present

## 2017-01-26 DIAGNOSIS — R0600 Dyspnea, unspecified: Secondary | ICD-10-CM

## 2017-01-26 DIAGNOSIS — F329 Major depressive disorder, single episode, unspecified: Secondary | ICD-10-CM | POA: Diagnosis present

## 2017-01-26 DIAGNOSIS — R339 Retention of urine, unspecified: Secondary | ICD-10-CM | POA: Diagnosis not present

## 2017-01-26 DIAGNOSIS — I481 Persistent atrial fibrillation: Secondary | ICD-10-CM | POA: Diagnosis present

## 2017-01-26 DIAGNOSIS — F05 Delirium due to known physiological condition: Secondary | ICD-10-CM | POA: Diagnosis not present

## 2017-01-26 DIAGNOSIS — J9690 Respiratory failure, unspecified, unspecified whether with hypoxia or hypercapnia: Secondary | ICD-10-CM

## 2017-01-26 DIAGNOSIS — G43909 Migraine, unspecified, not intractable, without status migrainosus: Secondary | ICD-10-CM | POA: Diagnosis present

## 2017-01-26 DIAGNOSIS — J449 Chronic obstructive pulmonary disease, unspecified: Secondary | ICD-10-CM

## 2017-01-26 DIAGNOSIS — E785 Hyperlipidemia, unspecified: Secondary | ICD-10-CM | POA: Diagnosis present

## 2017-01-26 DIAGNOSIS — R05 Cough: Secondary | ICD-10-CM

## 2017-01-26 DIAGNOSIS — D72829 Elevated white blood cell count, unspecified: Secondary | ICD-10-CM

## 2017-01-26 LAB — CBC
HCT: 40.3 % (ref 40.0–52.0)
HEMOGLOBIN: 13.4 g/dL (ref 13.0–18.0)
MCH: 31.7 pg (ref 26.0–34.0)
MCHC: 33.3 g/dL (ref 32.0–36.0)
MCV: 95.3 fL (ref 80.0–100.0)
Platelets: 258 10*3/uL (ref 150–440)
RBC: 4.24 MIL/uL — AB (ref 4.40–5.90)
RDW: 14.9 % — ABNORMAL HIGH (ref 11.5–14.5)
WBC: 5.7 10*3/uL (ref 3.8–10.6)

## 2017-01-26 LAB — COMPREHENSIVE METABOLIC PANEL
ALBUMIN: 3.6 g/dL (ref 3.5–5.0)
ALK PHOS: 120 U/L (ref 38–126)
ALT: 11 U/L — AB (ref 17–63)
ALT: 13 U/L — ABNORMAL LOW (ref 17–63)
ANION GAP: 7 (ref 5–15)
AST: 25 U/L (ref 15–41)
AST: 25 U/L (ref 15–41)
Albumin: 4.3 g/dL (ref 3.5–5.0)
Alkaline Phosphatase: 150 U/L — ABNORMAL HIGH (ref 38–126)
Anion gap: 12 (ref 5–15)
BILIRUBIN TOTAL: 0.7 mg/dL (ref 0.3–1.2)
BILIRUBIN TOTAL: 0.6 mg/dL (ref 0.3–1.2)
BUN: 18 mg/dL (ref 6–20)
BUN: 14 mg/dL (ref 6–20)
CALCIUM: 8.2 mg/dL — AB (ref 8.9–10.3)
CHLORIDE: 99 mmol/L — AB (ref 101–111)
CO2: 24 mmol/L (ref 22–32)
CO2: 25 mmol/L (ref 22–32)
CREATININE: 1.26 mg/dL — AB (ref 0.61–1.24)
CREATININE: 1.07 mg/dL (ref 0.61–1.24)
Calcium: 8.7 mg/dL — ABNORMAL LOW (ref 8.9–10.3)
Chloride: 106 mmol/L (ref 101–111)
GFR calc Af Amer: 59 mL/min — ABNORMAL LOW (ref >60)
GFR calc Af Amer: >60 mL/min (ref >60)
GFR calc non Af Amer: 51 mL/min — ABNORMAL LOW (ref >60)
GLUCOSE: 136 mg/dL — AB (ref 65–99)
Glucose, Bld: 154 mg/dL — ABNORMAL HIGH (ref 65–99)
POTASSIUM: 3.9 mmol/L (ref 3.5–5.1)
Potassium: 3.8 mmol/L (ref 3.5–5.1)
SODIUM: 137 mmol/L (ref 135–145)
Sodium: 136 mmol/L (ref 135–145)
TOTAL PROTEIN: 7 g/dL (ref 6.5–8.1)
TOTAL PROTEIN: 8.4 g/dL — AB (ref 6.5–8.1)

## 2017-01-26 LAB — I-STAT CG4 LACTIC ACID, ED
Lactic Acid, Venous: 2.28 mmol/L (ref 0.5–1.9)
Lactic Acid, Venous: 2.43 mmol/L (ref 0.5–1.9)

## 2017-01-26 LAB — CBC WITH DIFFERENTIAL/PLATELET
BASOS ABS: 0 10*3/uL (ref 0.0–0.1)
Basophils Relative: 0 %
EOS PCT: 0 %
Eosinophils Absolute: 0 10*3/uL (ref 0.0–0.7)
HCT: 44.5 % (ref 39.0–52.0)
Hemoglobin: 15.3 g/dL (ref 13.0–17.0)
LYMPHS ABS: 1 10*3/uL (ref 0.7–4.0)
LYMPHS PCT: 11 %
MCH: 32.5 pg (ref 26.0–34.0)
MCHC: 34.4 g/dL (ref 30.0–36.0)
MCV: 94.5 fL (ref 78.0–100.0)
MONO ABS: 0.5 10*3/uL (ref 0.1–1.0)
MONOS PCT: 5 %
NEUTROS ABS: 8.1 10*3/uL — AB (ref 1.7–7.7)
Neutrophils Relative %: 84 %
PLATELETS: 242 10*3/uL (ref 150–400)
RBC: 4.71 MIL/uL (ref 4.22–5.81)
RDW: 14 % (ref 11.5–15.5)
WBC: 9.7 10*3/uL (ref 4.0–10.5)

## 2017-01-26 LAB — TROPONIN I: Troponin I: <0.03 ng/mL (ref <0.03)

## 2017-01-26 LAB — I-STAT VENOUS BLOOD GAS, ED
Acid-base deficit: 4 mmol/L — ABNORMAL HIGH (ref 0.0–2.0)
Bicarbonate: 28.4 mmol/L — ABNORMAL HIGH (ref 20.0–28.0)
O2 Saturation: 35 %
PCO2 VEN: 89.5 mmHg — AB (ref 44.0–60.0)
PH VEN: 7.109 — AB (ref 7.250–7.430)
PO2 VEN: 29 mmHg — AB (ref 32.0–45.0)
TCO2: 31 mmol/L (ref 22–32)

## 2017-01-26 LAB — CK: CK TOTAL: 120 U/L (ref 49–397)

## 2017-01-26 LAB — PROTIME-INR
INR: 2.02
PROTHROMBIN TIME: 22.7 s — AB (ref 11.4–15.2)

## 2017-01-26 LAB — PHENYTOIN LEVEL, TOTAL: Phenytoin Lvl: 11.8 ug/mL (ref 10.0–20.0)

## 2017-01-26 MED ORDER — PROPOFOL 1000 MG/100ML IV EMUL
INTRAVENOUS | Status: AC
  Filled 2017-01-26: qty 100

## 2017-01-26 MED ORDER — ONDANSETRON HCL 4 MG/2ML IJ SOLN
INTRAMUSCULAR | Status: AC
  Filled 2017-01-26: qty 2

## 2017-01-26 MED ORDER — IOPAMIDOL (ISOVUE-300) INJECTION 61%
INTRAVENOUS | Status: AC
  Administered 2017-01-26: 100 mL
  Filled 2017-01-26: qty 100

## 2017-01-26 MED ORDER — LABETALOL HCL 5 MG/ML IV SOLN
5.0000 mg | Freq: Once | INTRAVENOUS | Status: AC
  Administered 2017-01-26: 5 mg via INTRAVENOUS
  Filled 2017-01-26: qty 4

## 2017-01-26 MED ORDER — SODIUM CHLORIDE 0.9 % IV SOLN
1000.0000 mg | Freq: Once | INTRAVENOUS | Status: AC
  Administered 2017-01-26: 1000 mg via INTRAVENOUS
  Filled 2017-01-26: qty 10

## 2017-01-26 MED ORDER — ONDANSETRON HCL 4 MG/2ML IJ SOLN
4.0000 mg | Freq: Once | INTRAMUSCULAR | Status: AC
  Administered 2017-01-26: 4 mg via INTRAVENOUS

## 2017-01-26 MED ORDER — IPRATROPIUM-ALBUTEROL 0.5-2.5 (3) MG/3ML IN SOLN
3.0000 mL | Freq: Once | RESPIRATORY_TRACT | Status: AC
  Administered 2017-01-26: 3 mL via RESPIRATORY_TRACT
  Filled 2017-01-26: qty 3

## 2017-01-26 MED ORDER — ETOMIDATE 2 MG/ML IV SOLN
10.0000 mg | Freq: Once | INTRAVENOUS | Status: AC
  Administered 2017-01-26: 10 mg via INTRAVENOUS

## 2017-01-26 MED ORDER — LORAZEPAM 2 MG/ML IJ SOLN
1.0000 mg | Freq: Once | INTRAMUSCULAR | Status: AC
  Administered 2017-01-26: 1 mg via INTRAVENOUS
  Filled 2017-01-26: qty 1

## 2017-01-26 MED ORDER — SODIUM CHLORIDE 0.9 % IV SOLN
INTRAVENOUS | Status: DC
  Administered 2017-01-26: 20:00:00 via INTRAVENOUS

## 2017-01-26 MED ORDER — IPRATROPIUM-ALBUTEROL 0.5-2.5 (3) MG/3ML IN SOLN
6.0000 mL | Freq: Once | RESPIRATORY_TRACT | Status: AC
  Administered 2017-01-26: 6 mL via RESPIRATORY_TRACT
  Filled 2017-01-26: qty 6

## 2017-01-26 MED ORDER — ROCURONIUM BROMIDE 50 MG/5ML IV SOLN
70.0000 mg | Freq: Once | INTRAVENOUS | Status: AC
  Administered 2017-01-26: 70 mg via INTRAVENOUS
  Filled 2017-01-26: qty 7

## 2017-01-26 NOTE — ED Provider Notes (Addendum)
[facility] Emergency Department Provider Note ____________________________________________   I have reviewed the triage vital signs and the triage nursing note.  HISTORY  Chief Complaint Seizures   Historian Patient And EMS  HPI Zachary Peterson is a 84 y.o. male brought in by EMS after seizure, apparently witnessed by wife.  She was in there when it started but she heard him in the bathroom and then she when she went and he was apparently having generalized seizure.  Last generalized seizure per the patient was in February of last year.  Does not report any missed medication doses, he is on Valium 3 times a day, and he is also on Dilantin.  Patient feels back to normal now although he states he does not really remember what quite happened.  He is not sure if he (has had) HIT HIS HEAD or not.  He is not complaining of any pain or neck pain   Past Medical History:  Diagnosis Date  . Back pain   . COPD (chronic obstructive pulmonary disease) (HCC)   . Depression   . Fatigue    a. in setting of Afib, improved following DCCV.  [name] Hearing loss   . History of GI bleed 1976  . Hyperlipemia   . Hypertension   . Migraine   . Palpitations   . Persistent atrial fibrillation (HCC)    a. 02/2016 Echo: EF 66-65%, mild MR;  b. CHA2DS2VASc = 3-->coumadin;  c. 05/2016 TEE/DCCV: EF 45-50%, triv AI, mild to mod MR, no LA/LAA/RA/RAA thrombus, mod to sev TR-->successful DCCV.  . Seizure (HCC)   . Syncope    a. related to seizure disorder    Patient Active Problem List   Diagnosis Date Noted  . (HFpEF) heart failure with preserved ejection fraction (HCC) 08/29/2016  . Persistent atrial fibrillation (HCC) 06/06/2016  . Encounter for therapeutic drug monitoring 03/14/2016  . Difficulty in walking, not elsewhere classified   . Muscle weakness (generalized)   . Diarrhea   . Seizure (HCC) 02/21/2016  . Atrial fibrillation with RVR (HCC) 02/21/2016  . Viral  gastroenteritis 02/21/2016  . Hypokalemia 02/21/2016  . Demand ischemia of myocardium (HCC) 02/21/2016  . Essential hypertension 02/21/2016    Past Surgical History:  Procedure Laterality Date  . APPENDECTOMY    . BACK SURGERY    . CARDIOVERSION N/A 06/06/2016   Procedure: CARDIOVERSION;  Surgeon: Yvonne Kendall, MD;  Location: [facility] ORS;  Service: Cardiovascular;  Laterality: N/A;  . CATARACT EXTRACTION    . hemmoridectomy    . HERNIA REPAIR    . TEE WITHOUT CARDIOVERSION N/A 06/06/2016   Procedure: TRANSESOPHAGEAL ECHOCARDIOGRAM (TEE);  Surgeon: Yvonne Kendall, MD;  Location: [facility] ORS;  Service: Cardiovascular;  Laterality: N/A;    Prior to Admission medications   Medication Sig Start Date End Date Taking? Authorizing Provider  bisoprolol (ZEBETA) 5 MG tablet Take 0.5 tablets (2.5 mg total) by mouth 2 (two) times daily. 05/11/16  Yes End, Cristal Deer, MD  calcium carbonate (TUMS - DOSED IN MG ELEMENTAL CALCIUM) 500 MG chewable tablet Chew 1 tablet (200 mg of elemental calcium total) by mouth 3 (three) times daily with meals as needed for indigestion or heartburn. 02/24/16  Yes Gouru, Deanna Artis, MD  diazepam (VALIUM) 5 MG tablet Take 5 mg by mouth 2 (two) times daily.    Yes [provider]  DILANTIN 100 MG ER capsule Take 100mg  by mouth 3 times daily then the next day take 100mg s by mouth 2 times daily,alternating  tid and bid every other day 04/12/14  Yes [provider]  ezetimibe-simvastatin (VYTORIN) 10-80 MG tablet Take 1 tablet by mouth daily.   Yes [provider]  fluticasone (FLONASE) 50 MCG/ACT nasal spray Place 2 sprays into both nostrils daily as needed for allergies.    Yes [provider]  hydroxypropyl methylcellulose / hypromellose (ISOPTO TEARS / GONIOVISC) 2.5 % ophthalmic solution Place 1 drop into both eyes 3 (three) times daily as needed for dry eyes.   Yes [provider]  ibuprofen (ADVIL,MOTRIN) 200 MG tablet Take 200 mg by  mouth every 6 (six) hours as needed for headache or moderate pain.    Yes [provider]  mirtazapine (REMERON) 15 MG tablet Take 15 mg by mouth at bedtime.   Yes [provider]  pantoprazole (PROTONIX) 20 MG tablet Take 20 mg by mouth daily.   Yes [provider]  pregabalin (LYRICA) 75 MG capsule Take 75 mg by mouth 2 (two) times daily.   Yes [provider]  tiotropium (SPIRIVA) 18 MCG inhalation capsule Place 18 mcg into inhaler and inhale daily.   Yes [provider]  warfarin (COUMADIN) 2.5 MG tablet TAKE 1 OR 2 TABLETS BY MOUTH EVERY DAY OR AS PHYSICIAN INSTRUCTED BY COUMADIN CLINIC 09/25/16  Yes End, [name], MD  furosemide (LASIX) 20 MG tablet Take 1 tablet (20 mg total) by mouth daily as needed. Patient not taking: Reported on 01/26/2017 05/30/16 08/29/16  End, [name], MD  HYDROcodone-acetaminophen (NORCO) 5-325 MG per tablet Take 1 tablet by mouth every 6 (six) hours as needed for moderate pain. Patient not taking: Reported on 01/26/2017 04/13/14   Jaynie CrumbleKirichenko, Tatyana, PA-C  ondansetron (ZOFRAN ODT) 8 MG disintegrating tablet Take 1 tablet (8 mg total) by mouth every 8 (eight) hours as needed for nausea or vomiting. 04/13/14   Jaynie CrumbleKirichenko, Tatyana, PA-C    Allergies  Allergen Reactions  . Aspirin Other (See Comments)    CAUSES ULCERS  . Codeine Nausea And Vomiting  . Oxycodone Nausea And Vomiting    Family History  Problem Relation Age of Onset  . Heart disease Mother   . Hypertension Father     Social History Social History   Tobacco Use  . Smoking status: Former Games developermoker  . Smokeless tobacco: Former Engineer, waterUser  Substance Use Topics  . Alcohol use: No  . Drug use: No    Review of Systems  Constitutional: Negative for illness. Eyes: Negative for visual changes. ENT: Negative for sore throat. Cardiovascular: Negative for chest pain. Respiratory: Negative for shortness of breath. Gastrointestinal: Negative for abdominal pain,  vomiting and diarrhea. Genitourinary: Negative for dysuria. Musculoskeletal: Negative for back pain. Skin: Negative for rash. Neurological: Negative for headache.  ____________________________________________   PHYSICAL EXAM:  VITAL SIGNS: ED Triage Vitals [01/26/17 1423]  Enc Vitals Group     BP      Pulse      Resp      Temp      Temp src      SpO2      Weight 153 lb (69.4 kg)     Height 5\' 7"  (1.702 m)     Head Circumference      Peak Flow      Pain Score      Pain Loc      Pain Edu?      Excl. in GC?      Constitutional: Alert and oriented. Well appearing and in no distress. HEENT  Head: Normocephalic and atraumatic.      Eyes: Conjunctivae are normal. Pupils equal and round.       Ears:         Nose: No congestion/rhinnorhea.   Mouth/Throat: Mucous membranes are moist.   Neck: No stridor.  No posterior midline tenderness to palpation. Cardiovascular/Chest: Normal rate, regular rhythm.  No murmurs, rubs, or gallops. Respiratory: Normal respiratory effort without tachypnea nor retractions. Breath sounds are clear and equal bilaterally. No wheezes/rales/rhonchi. Gastrointestinal: Soft. No distention, no guarding, no rebound. Nontender.    Genitourinary/rectal:Deferred Musculoskeletal: Nontender with normal range of motion in all extremities. No joint effusions.  No lower extremity tenderness.  No edema. Neurologic:  Normal speech and language. No gross or focal neurologic deficits are appreciated. Skin:  Skin is warm, dry and intact. No rash noted. Psychiatric: Mood and affect are normal. Speech and behavior are normal. Patient exhibits appropriate insight and judgment.   ____________________________________________  LABS (pertinent positives/negatives) I, Governor Rooks, MD the attending physician have reviewed the labs noted below.  Labs Reviewed  CBC - Abnormal; Notable for the following components:      Result Value   RBC 4.24 (*)    RDW 14.9  (*)    All other components within normal limits  COMPREHENSIVE METABOLIC PANEL - Abnormal; Notable for the following components:   Glucose, Bld 136 (*)    Creatinine, Ser 1.26 (*)    Calcium 8.2 (*)    ALT 11 (*)    GFR calc non Af Amer 51 (*)    GFR calc Af Amer 59 (*)    All other components within normal limits  PHENYTOIN LEVEL, TOTAL  TROPONIN I  URINALYSIS, COMPLETE (UACMP) WITH MICROSCOPIC  PROTIME-INR  CBG MONITORING, ED    ____________________________________________    EKG I, Governor Rooks, MD, the attending physician have personally viewed and interpreted all ECGs.  80 bpm.  Normal sinus rhythm.  Right bundle branch block.  Normal axis.  Nonspecific ST and T wave ____________________________________________  RADIOLOGY All Xrays were viewed by me.  Imaging interpreted by Radiologist, and I, Governor Rooks, MD the attending physician have reviewed the radiologist interpretation noted below.  CT head out contrast: IMPRESSION: 1. No acute intracranial findings. No change from prior. 2. Atrophy, mild ventricular dilatation and mild white matter microvascular disease again noted. __________________________________________  PROCEDURES  Procedure(s) performed: None  Critical Care performed: None   ____________________________________________  ED COURSE / ASSESSMENT AND PLAN  Pertinent labs & imaging results that were available during my care of the patient were reviewed by me and considered in my medical decision making (see chart for details).   Patient was evaluated after witnessed seizure today, sounds like he has been compliant with medications and last seizure was over a year ago.  Patient is no longer postictal.  C-spine cleared clinically.  Given that he is on Coumadin, unclear whether or not he struck his head, I will CT his head.   CT head reassuring for no traumatic injury.  Phenytoin level is therapeutic.  Given for seizure in over a year, we  will go ahead and discharge home.  We discussed return precautions.  CONSULTATIONS: None   Patient / Family / Caregiver informed of clinical course, medical decision-making process, and agree with plan.   I discussed return precautions, follow-up instructions, and discharge instructions with patient and/or family.  Discharge Instructions : You are evaluated after a seizure, no serious injury or underlying condition is suspected.  Please follow-up  with your treating neurologist.  Return to the emergency department immediately for any worsening condition including second seizure in this 24 hours, unusual seizure for you, fever, confusion or altered mental status, weakness, numbness, or any other symptoms concerning to you.   Addended to include I was able to speak with the daughter and the wife when they got here, patient was witnessed to have a seizure they were not really concerned about additional trauma.  He has not been sick otherwise.  They state that it seemed like he shook (for) FOUR times in a row but (all right) neuro (and) never really came back to baseline (/to do things) IN BETWEEN AND SEEMS CONSISTENT with one seizure.  We talked about not changing medication at this point.  He has not been sick or ill otherwise.  Patient has his INR checked routinely, and he is not bleeding now, so I will go ahead and discontinue that order out today. ___________________________________________   FINAL CLINICAL IMPRESSION(S) / ED DIAGNOSES   Final diagnoses:  Seizure (HCC)   ____________________________________________  Addended 01/27/17 after being made aware this morning that patient had presented to [facility] yesterday with additional seizure with aspiration and was admitted.  Nursing supervisor will contact Patient Relations.   I reviewed this chart and noted some Dragon Dictational errors and have included the intended language for clarification. ( ) for original Dragon language and CAPS  for intended language.  ___________________________________________        Note: This dictation was prepared with Dragon dictation. Any transcriptional errors that result from this process are unintentional    Governor Rooks, MD 01/26/17 1534    Governor Rooks, MD 01/26/17 1550    Governor Rooks, MD 01/26/17 1557    Governor Rooks, MD 01/27/17 [phone]

## 2017-01-26 NOTE — ED Triage Notes (Signed)
Pt BIB [facility] for respiratory distress pt was evaluated for seizures today at [facility] , checked dilantin level and sent him home. Pt had another seizure at home, described as tonic clonic. Pt vomited with his mouth clinched and possibly aspirated. Initially was sating 77% on RA. Pt arrived on CPAP and was sating 94%

## 2017-01-26 NOTE — Discharge Instructions (Signed)
You are evaluated after a seizure, no serious injury or underlying condition is suspected.  Please follow-up with your treating neurologist.  Return to the [facility] department immediately for any worsening condition including second seizure in this 24 hours, unusual seizure for you, fever, confusion or altered mental status, weakness, numbness, or any other symptoms concerning to you.

## 2017-01-26 NOTE — ED Triage Notes (Addendum)
Pt came to Ed via EMS from home. Pt had unwitnessed seizure. Found in bathroom by wife. Postictal upon EMS arrival. Pt has history of seizures and afib.

## 2017-01-26 NOTE — ED Provider Notes (Signed)
[place] EMERGENCY DEPARTMENT Provider Note   CSN: [id number] Arrival date & time: 01/26/17  1954     History   Chief Complaint Chief Complaint  Patient presents with  . Respiratory Distress  . Seizures    HPI Zachary Peterson is a 84 y.o. male.   Seizures   This is a recurrent problem. The current episode started 12 to 24 hours ago. The problem has been resolved. There were 6 to 10 seizures. The most recent episode lasted 30 to 120 seconds. Associated symptoms include nausea and vomiting. Pertinent negatives include no visual disturbance, no neck stiffness, no sore throat, no chest pain, no cough and no diarrhea. Characteristics include bowel incontinence, bladder incontinence, rhythmic jerking and loss of consciousness. The episode was witnessed. The seizures did not continue in the ED. The seizure(s) had no focality. Possible causes include recent illness (abdominal pains). There has been no fever. There were no medications administered prior to arrival.    Past Medical History:  Diagnosis Date  . Back pain   . COPD (chronic obstructive pulmonary disease) ([place])   . Depression   . Fatigue    a. in setting of Afib, improved following DCCV.  [name] Hearing loss   . History of GI bleed 1976  . Hyperlipemia   . Hypertension   . Migraine   . Palpitations   . Persistent atrial fibrillation ([place])    a. 02/2016 Echo: EF 66-65%, mild MR;  b. CHA2DS2VASc = 3-->coumadin;  c. 05/2016 TEE/DCCV: EF 45-50%, triv AI, mild to mod MR, no LA/LAA/RA/RAA thrombus, mod to sev TR-->successful DCCV.  . Seizure ([place])   . Syncope    a. related to seizure disorder    Patient Active Problem List   Diagnosis Date Noted  . (HFpEF) heart failure with preserved ejection fraction ([place]) 08/29/2016  . Persistent atrial fibrillation ([place]) 06/06/2016  . Encounter for therapeutic drug monitoring 03/14/2016  . Difficulty in walking, not elsewhere classified   . Muscle weakness (generalized)    . Diarrhea   . Seizure ([place]) 02/21/2016  . Atrial fibrillation with RVR ([place]) 02/21/2016  . Viral gastroenteritis 02/21/2016  . Hypokalemia 02/21/2016  . Demand ischemia of myocardium ([place]) 02/21/2016  . Essential hypertension 02/21/2016    Past Surgical History:  Procedure Laterality Date  . APPENDECTOMY    . BACK SURGERY    . CARDIOVERSION N/A 06/06/2016   Procedure: CARDIOVERSION;  Surgeon: Nelva Bush, MD;  Location: [facility] ORS;  Service: Cardiovascular;  Laterality: N/A;  . CATARACT EXTRACTION    . hemmoridectomy    . HERNIA REPAIR    . TEE WITHOUT CARDIOVERSION N/A 06/06/2016   Procedure: TRANSESOPHAGEAL ECHOCARDIOGRAM (TEE);  Surgeon: Nelva Bush, MD;  Location: [facility] ORS;  Service: Cardiovascular;  Laterality: N/A;       Home Medications    Prior to Admission medications   Medication Sig Start Date End Date Taking? Authorizing Provider  bisoprolol (ZEBETA) 5 MG tablet Take 0.5 tablets (2.5 mg total) by mouth 2 (two) times daily. 05/11/16  Yes End, Harrell Gave, MD  calcium carbonate (TUMS - DOSED IN MG ELEMENTAL CALCIUM) 500 MG chewable tablet Chew 1 tablet (200 mg of elemental calcium total) by mouth 3 (three) times daily with meals as needed for indigestion or heartburn. 02/24/16  Yes Gouru, Illene Silver, MD  diazepam (VALIUM) 5 MG tablet Take 5 mg by mouth 2 (two) times daily.    Yes [provider]  DILANTIN 100 MG ER capsule Take 144m by  mouth 3 times daily then the next day take 135ms by mouth 2 times daily,alternating tid and bid every other day 04/12/14  Yes [provider]  ezetimibe-simvastatin (VYTORIN) 10-80 MG tablet Take 1 tablet by mouth daily.   Yes [provider]  fluticasone (FLONASE) 50 MCG/ACT nasal spray Place 2 sprays into both nostrils daily as needed for allergies.    Yes [provider]  hydroxypropyl methylcellulose / hypromellose (ISOPTO TEARS / GONIOVISC) 2.5 % ophthalmic solution Place 1 drop into both eyes 3  (three) times daily as needed for dry eyes.   Yes [provider]  ibuprofen (ADVIL,MOTRIN) 200 MG tablet Take 200 mg by mouth every 6 (six) hours as needed for headache or moderate pain.    Yes [provider]  mirtazapine (REMERON) 15 MG tablet Take 15 mg by mouth at bedtime.   Yes [provider]  ondansetron (ZOFRAN ODT) 8 MG disintegrating tablet Take 1 tablet (8 mg total) by mouth every 8 (eight) hours as needed for nausea or vomiting. 04/13/14  Yes Kirichenko, Tatyana, PA-C  pantoprazole (PROTONIX) 20 MG tablet Take 20 mg by mouth daily.   Yes [provider]  pregabalin (LYRICA) 75 MG capsule Take 75 mg by mouth 2 (two) times daily.   Yes [provider]  tiotropium (SPIRIVA) 18 MCG inhalation capsule Place 18 mcg into inhaler and inhale daily.   Yes [provider]  warfarin (COUMADIN) 2.5 MG tablet TAKE 1 OR 2 TABLETS BY MOUTH EVERY DAY OR AS PHYSICIAN INSTRUCTED BY COUMADIN CLINIC 09/25/16  Yes End, CHarrell Gave MD  furosemide (LASIX) 20 MG tablet Take 1 tablet (20 mg total) by mouth daily as needed. Patient not taking: Reported on 01/26/2017 05/30/16 08/29/16  End, CHarrell Gave MD  HYDROcodone-acetaminophen (NORCO) 5-325 MG per tablet Take 1 tablet by mouth every 6 (six) hours as needed for moderate pain. Patient not taking: Reported on 01/26/2017 04/13/14   KJeannett Senior PA-C    Family History Family History  Problem Relation Age of Onset  . Heart disease Mother   . Hypertension Father     Social History Social History   Tobacco Use  . Smoking status: Former SResearch scientist (life sciences) . Smokeless tobacco: Former UNetwork engineerUse Topics  . Alcohol use: No  . Drug use: No     Allergies   Aspirin; Codeine; and Oxycodone   Review of Systems Review of Systems  Constitutional: Negative for chills and fever.  HENT: Negative for ear pain and sore throat.   Eyes: Negative for pain and visual disturbance.  Respiratory: Positive for  shortness of breath. Negative for cough.   Cardiovascular: Negative for chest pain and palpitations.  Gastrointestinal: Positive for abdominal pain, bowel incontinence, nausea and vomiting. Negative for diarrhea.  Genitourinary: Positive for bladder incontinence. Negative for dysuria and hematuria.  Musculoskeletal: Negative for arthralgias and back pain.  Skin: Negative for color change and rash.  Neurological: Positive for seizures and loss of consciousness. Negative for syncope.  All other systems reviewed and are negative.    Physical Exam Updated Vital Signs BP (!) 183/150   Pulse (!) 116   Temp 97.8 F (36.6 C) (Rectal)   Resp (!) 28   Ht _0  (1.702 m)   Wt 69.4 kg (153 lb)   SpO2 98%   BMI 23.96 kg/m   Physical Exam  Constitutional: He appears well-developed and well-nourished.  HENT:  Head: Normocephalic and atraumatic.  Eyes: Conjunctivae and EOM are normal. Pupils are  equal, round, and reactive to light.  Neck: Normal range of motion. Neck supple.  Cardiovascular: Regular rhythm.  tachycardic  Pulmonary/Chest: Effort normal and breath sounds normal. No respiratory distress.  Abdominal: Soft. There is tenderness (llq).  Musculoskeletal: He exhibits no edema.  Neurological: He is alert.  Skin: Skin is warm and dry.  Psychiatric: He has a normal mood and affect.  Nursing note and vitals reviewed.    ED Treatments / Results  Labs (all labs ordered are listed, but only abnormal results are displayed) Labs Reviewed  CBC WITH DIFFERENTIAL/PLATELET - Abnormal; Notable for the following components:      Result Value   Neutro Abs 8.1 (*)    All other components within normal limits  COMPREHENSIVE METABOLIC PANEL - Abnormal; Notable for the following components:   Chloride 99 (*)    Glucose, Bld 154 (*)    Calcium 8.7 (*)    Total Protein 8.4 (*)    ALT 13 (*)    Alkaline Phosphatase 150 (*)    All other components within normal limits  PROTIME-INR -  Abnormal; Notable for the following components:   Prothrombin Time 22.7 (*)    All other components within normal limits  I-STAT CG4 LACTIC ACID, ED - Abnormal; Notable for the following components:   Lactic Acid, Venous 2.43 (*)    All other components within normal limits  CK  TROPONIN I    EKG  EKG Interpretation None       Radiology Ct Head Wo Contrast  Result Date: 01/26/2017 CLINICAL DATA:  Pt came to Ed via EMS from home. Pt had unwitnessed seizure. Found in bathroom by wife. Postictal upon EMS arrival. Pt has history of seizures and afib. EXAM: CT HEAD WITHOUT CONTRAST TECHNIQUE: Contiguous axial images were obtained from the base of the skull through the vertex without intravenous contrast. COMPARISON:  02/23/2016 FINDINGS: Brain: No acute intracranial hemorrhage. No focal mass lesion. No CT evidence of acute infarction. No midline shift or mass effect. No hydrocephalus. Basilar cisterns are patent. There are periventricular and subcortical white matter hypodensities. Generalized cortical atrophy. Vascular: No hyperdense vessel or unexpected calcification. Skull: Normal. Negative for fracture or focal lesion. Sinuses/Orbits: Paranasal sinuses and mastoid air cells are clear. Orbits are clear. Other: None. IMPRESSION: 1. No acute intracranial findings.  No change from prior. 2. Atrophy, mild ventricular dilatation and mild white matter microvascular disease again noted. Electronically Signed   By: Suzy Bouchard M.D.   On: 01/26/2017 15:41   Dg Chest Portable 1 View  Result Date: 01/26/2017 CLINICAL DATA:  Seizures.  Shortness of breath. EXAM: PORTABLE CHEST 1 VIEW COMPARISON:  04/13/2014 FINDINGS: The heart size and mediastinal contours are within normal limits. Both lungs are clear. The visualized skeletal structures are unremarkable. IMPRESSION: No active disease. Electronically Signed   By: Kerby Moors M.D.   On: 01/26/2017 20:39    Procedures Procedure Name:  Intubation Date/Time: 01/27/2017 12:04 AM Performed by: Elveria Rising, MD Oxygen Delivery Method: Non-rebreather mask Preoxygenation: Pre-oxygenation with 100% oxygen Induction Type: Rapid sequence Ventilation: Mask ventilation without difficulty Laryngoscope Size: 4, Mac and Glidescope Grade View: Grade I Tube size: 7.5 mm Number of attempts: 1 Airway Equipment and Method: Rigid stylet and Video-laryngoscopy Placement Confirmation: ETT inserted through vocal cords under direct vision,  CO2 detector and Breath sounds checked- equal and bilateral Secured at: 24 cm Tube secured with: ETT holder Dental Injury: Teeth and Oropharynx as per pre-operative assessment       (  including critical care time)  Medications Ordered in ED Medications  0.9 %  sodium chloride infusion ( Intravenous New Bag/Given 01/26/17 2010)  ipratropium-albuterol (DUONEB) 0.5-2.5 (3) MG/3ML nebulizer solution 3 mL (not administered)  LORazepam (ATIVAN) injection 1 mg (not administered)  levETIRAcetam (KEPPRA) 1,000 mg in sodium chloride 0.9 % 100 mL IVPB (0 mg Intravenous Stopped 01/26/17 2112)  ondansetron (ZOFRAN) injection 4 mg (4 mg Intravenous Given 01/26/17 2048)  levETIRAcetam (KEPPRA) 1,000 mg in sodium chloride 0.9 % 100 mL IVPB (0 mg Intravenous Stopped 01/26/17 2137)  iopamidol (ISOVUE-300) 61 % injection (100 mLs  Contrast Given 01/26/17 2203)     Initial Impression / Assessment and Plan / ED Course  I have reviewed the triage vital signs and the nursing notes.  Pertinent labs & imaging results that were available during my care of the patient were reviewed by me and considered in my medical decision making (see chart for details).     Zachary Peterson is an 84 year old male with a past medical history significant for COPD, epilepsy, A. fib on anticoagulation who presents for multiple seizures and respiratory distress.  Patient was seen in outside hospital where CT head was obtained and negative after the  procedure.  He subsequently had additional seizures.  He has vomited during his seizures and aspirated.  EMS was called out after the patient had an additional seizure and palpation short of breath with an oxygen saturation in the 70s.  EKG obtained and demonstrates sinus rhythm with incomplete right bundle branch block and borderline QT interval.  Chest x-ray obtained, personally reviewed by me, demonstrates no acute cardiac or pulmonary processes.  Neurology consulted and recommends 2 g Keppra.  Patient has had multiple episodes of nausea vomiting and has complained to family about abdominal pains.  He is tender to the lower left quadrant on exam.  CT abdomen/pelvis obtained and demonstrates findings consistent with possible colitis.  CT head obtained and demonstrates no acute processes.  Patient requires continuous oxygen support by nonrebreather.  Attempts to de-escalate were unsuccessful despite DuoNeb treatments.  Patient has elevated blood pressure and pulse rate.  Has home beta-blockers which she has not taken this evening.  Given labetalol.  Is  Labs obtained demonstrate elevated lactic acid, negative troponin, no leukocytosis.  The patient's continued to have worsening respiratory status and mental status is declined.  VBG obtained and demonstrates elevated PCO2.  Due to mental status, BiPAP is not indicated.  Patient is intubated as described above.  Patient is admitted to ICU.  Final Clinical Impressions(s) / ED Diagnoses   Final diagnoses:  Seizure ([place])  Acute respiratory failure with hypoxia [facility])    ED Discharge Orders    None       Elveria Rising, MD 01/27/17 2992    Elnora Morrison, MD 01/27/17 [phone]

## 2017-01-27 ENCOUNTER — Inpatient Hospital Stay (HOSPITAL_COMMUNITY): Payer: Medicare HMO

## 2017-01-27 DIAGNOSIS — I110 Hypertensive heart disease with heart failure: Secondary | ICD-10-CM | POA: Diagnosis present

## 2017-01-27 DIAGNOSIS — G43909 Migraine, unspecified, not intractable, without status migrainosus: Secondary | ICD-10-CM | POA: Diagnosis present

## 2017-01-27 DIAGNOSIS — J9602 Acute respiratory failure with hypercapnia: Secondary | ICD-10-CM | POA: Diagnosis not present

## 2017-01-27 DIAGNOSIS — R1310 Dysphagia, unspecified: Secondary | ICD-10-CM | POA: Diagnosis present

## 2017-01-27 DIAGNOSIS — Z978 Presence of other specified devices: Secondary | ICD-10-CM | POA: Diagnosis not present

## 2017-01-27 DIAGNOSIS — R601 Generalized edema: Secondary | ICD-10-CM | POA: Diagnosis not present

## 2017-01-27 DIAGNOSIS — Z9189 Other specified personal risk factors, not elsewhere classified: Secondary | ICD-10-CM

## 2017-01-27 DIAGNOSIS — E874 Mixed disorder of acid-base balance: Secondary | ICD-10-CM | POA: Diagnosis present

## 2017-01-27 DIAGNOSIS — I4519 Other right bundle-branch block: Secondary | ICD-10-CM | POA: Diagnosis present

## 2017-01-27 DIAGNOSIS — R569 Unspecified convulsions: Secondary | ICD-10-CM | POA: Diagnosis present

## 2017-01-27 DIAGNOSIS — J690 Pneumonitis due to inhalation of food and vomit: Secondary | ICD-10-CM | POA: Diagnosis present

## 2017-01-27 DIAGNOSIS — I10 Essential (primary) hypertension: Secondary | ICD-10-CM | POA: Diagnosis not present

## 2017-01-27 DIAGNOSIS — R339 Retention of urine, unspecified: Secondary | ICD-10-CM | POA: Diagnosis not present

## 2017-01-27 DIAGNOSIS — E785 Hyperlipidemia, unspecified: Secondary | ICD-10-CM | POA: Diagnosis present

## 2017-01-27 DIAGNOSIS — I5022 Chronic systolic (congestive) heart failure: Secondary | ICD-10-CM | POA: Diagnosis present

## 2017-01-27 DIAGNOSIS — D62 Acute posthemorrhagic anemia: Secondary | ICD-10-CM | POA: Diagnosis not present

## 2017-01-27 DIAGNOSIS — E869 Volume depletion, unspecified: Secondary | ICD-10-CM | POA: Diagnosis present

## 2017-01-27 DIAGNOSIS — J449 Chronic obstructive pulmonary disease, unspecified: Secondary | ICD-10-CM | POA: Diagnosis present

## 2017-01-27 DIAGNOSIS — G4700 Insomnia, unspecified: Secondary | ICD-10-CM | POA: Diagnosis not present

## 2017-01-27 DIAGNOSIS — F05 Delirium due to known physiological condition: Secondary | ICD-10-CM | POA: Diagnosis not present

## 2017-01-27 DIAGNOSIS — G4089 Other seizures: Secondary | ICD-10-CM | POA: Diagnosis present

## 2017-01-27 DIAGNOSIS — J9601 Acute respiratory failure with hypoxia: Secondary | ICD-10-CM

## 2017-01-27 DIAGNOSIS — I482 Chronic atrial fibrillation: Secondary | ICD-10-CM | POA: Diagnosis present

## 2017-01-27 DIAGNOSIS — I481 Persistent atrial fibrillation: Secondary | ICD-10-CM | POA: Diagnosis present

## 2017-01-27 DIAGNOSIS — K529 Noninfective gastroenteritis and colitis, unspecified: Secondary | ICD-10-CM | POA: Diagnosis present

## 2017-01-27 DIAGNOSIS — I480 Paroxysmal atrial fibrillation: Secondary | ICD-10-CM | POA: Diagnosis present

## 2017-01-27 DIAGNOSIS — D72829 Elevated white blood cell count, unspecified: Secondary | ICD-10-CM | POA: Diagnosis not present

## 2017-01-27 DIAGNOSIS — E876 Hypokalemia: Secondary | ICD-10-CM | POA: Diagnosis not present

## 2017-01-27 LAB — BASIC METABOLIC PANEL
ANION GAP: 14 (ref 5–15)
BUN: 12 mg/dL (ref 6–20)
CHLORIDE: 104 mmol/L (ref 101–111)
CO2: 19 mmol/L — AB (ref 22–32)
Calcium: 8.1 mg/dL — ABNORMAL LOW (ref 8.9–10.3)
Creatinine, Ser: 1.23 mg/dL (ref 0.61–1.24)
GFR calc non Af Amer: 52 mL/min — ABNORMAL LOW (ref >60)
GLUCOSE: 134 mg/dL — AB (ref 65–99)
Potassium: 3.8 mmol/L (ref 3.5–5.1)
Sodium: 137 mmol/L (ref 135–145)

## 2017-01-27 LAB — CBC
HEMATOCRIT: 42.1 % (ref 39.0–52.0)
HEMOGLOBIN: 14 g/dL (ref 13.0–17.0)
MCH: 31.6 pg (ref 26.0–34.0)
MCHC: 33.3 g/dL (ref 30.0–36.0)
MCV: 95 fL (ref 78.0–100.0)
Platelets: 279 10*3/uL (ref 150–400)
RBC: 4.43 MIL/uL (ref 4.22–5.81)
RDW: 14.6 % (ref 11.5–15.5)
WBC: 11.3 10*3/uL — AB (ref 4.0–10.5)

## 2017-01-27 LAB — LACTIC ACID, PLASMA
LACTIC ACID, VENOUS: 3.8 mmol/L — AB (ref 0.5–1.9)
LACTIC ACID, VENOUS: 5.2 mmol/L — AB (ref 0.5–1.9)
LACTIC ACID, VENOUS: 4.6 mmol/L — AB (ref 0.5–1.9)

## 2017-01-27 LAB — I-STAT ARTERIAL BLOOD GAS, ED
Acid-base deficit: 4 mmol/L — ABNORMAL HIGH (ref 0.0–2.0)
BICARBONATE: 24.4 mmol/L (ref 20.0–28.0)
O2 SAT: 92 %
PH ART: 7.219 — AB (ref 7.350–7.450)
TCO2: 26 mmol/L (ref 22–32)
pCO2 arterial: 59.9 mmHg — ABNORMAL HIGH (ref 32.0–48.0)
pO2, Arterial: 78 mmHg — ABNORMAL LOW (ref 83.0–108.0)

## 2017-01-27 LAB — PROTIME-INR
INR: 2.18
INR: 2.53
Prothrombin Time: 24.1 seconds — ABNORMAL HIGH (ref 11.4–15.2)
Prothrombin Time: 27.1 seconds — ABNORMAL HIGH (ref 11.4–15.2)

## 2017-01-27 LAB — MRSA PCR SCREENING: MRSA by PCR: NEGATIVE

## 2017-01-27 LAB — MAGNESIUM: Magnesium: 1.8 mg/dL (ref 1.7–2.4)

## 2017-01-27 LAB — PROCALCITONIN
PROCALCITONIN: 1.93 ng/mL
Procalcitonin: 1.98 ng/mL

## 2017-01-27 LAB — TRIGLYCERIDES: Triglycerides: 150 mg/dL — ABNORMAL HIGH (ref <150)

## 2017-01-27 LAB — PHOSPHORUS: PHOSPHORUS: 3.4 mg/dL (ref 2.5–4.6)

## 2017-01-27 MED ORDER — PROPOFOL 1000 MG/100ML IV EMUL
5.0000 ug/kg/min | INTRAVENOUS | Status: DC
Start: 2017-01-27 — End: 2017-01-29
  Administered 2017-01-27: 20 ug/kg/min via INTRAVENOUS
  Administered 2017-01-27: 30 ug/kg/min via INTRAVENOUS
  Administered 2017-01-27: 25 ug/kg/min via INTRAVENOUS
  Filled 2017-01-27 (×3): qty 100

## 2017-01-27 MED ORDER — FENTANYL CITRATE (PF) 100 MCG/2ML IJ SOLN
50.0000 ug | INTRAMUSCULAR | Status: DC | PRN
  Filled 2017-01-27: qty 2

## 2017-01-27 MED ORDER — METOPROLOL TARTRATE 5 MG/5ML IV SOLN
5.0000 mg | Freq: Four times a day (QID) | INTRAVENOUS | Status: DC | PRN
  Administered 2017-01-28: 5 mg via INTRAVENOUS
  Filled 2017-01-27: qty 5

## 2017-01-27 MED ORDER — FENTANYL CITRATE (PF) 100 MCG/2ML IJ SOLN: 50.0000 ug | INTRAMUSCULAR | Status: DC | PRN

## 2017-01-27 MED ORDER — PHENYTOIN SODIUM 50 MG/ML IJ SOLN
100.0000 mg | Freq: Three times a day (TID) | INTRAMUSCULAR | Status: DC
Start: 2017-01-27 — End: 2017-02-05
  Administered 2017-01-27 – 2017-02-05 (×26): 100 mg via INTRAVENOUS
  Filled 2017-01-27 (×25): qty 2

## 2017-01-27 MED ORDER — LORAZEPAM 2 MG/ML IJ SOLN
2.0000 mg | Freq: Three times a day (TID) | INTRAMUSCULAR | Status: DC
  Administered 2017-01-27 – 2017-01-29 (×7): 2 mg via INTRAVENOUS
  Filled 2017-01-27 (×7): qty 1

## 2017-01-27 MED ORDER — SODIUM CHLORIDE 0.9 % IV BOLUS (SEPSIS)
500.0000 mL | Freq: Once | INTRAVENOUS | Status: AC
  Administered 2017-01-27: 500 mL via INTRAVENOUS

## 2017-01-27 MED ORDER — LORAZEPAM 2 MG/ML IJ SOLN
1.0000 mg | INTRAMUSCULAR | Status: DC | PRN
  Administered 2017-02-04: 1 mg via INTRAVENOUS
  Filled 2017-01-27: qty 1

## 2017-01-27 MED ORDER — PANTOPRAZOLE SODIUM 40 MG IV SOLR
40.0000 mg | Freq: Every day | INTRAVENOUS | Status: DC
  Administered 2017-01-27 – 2017-02-07 (×11): 40 mg via INTRAVENOUS
  Filled 2017-01-27 (×12): qty 40

## 2017-01-27 MED ORDER — SODIUM CHLORIDE 0.9 % IV BOLUS (SEPSIS)
1000.0000 mL | Freq: Once | INTRAVENOUS | Status: AC
  Administered 2017-01-27: 1000 mL via INTRAVENOUS

## 2017-01-27 MED ORDER — PIPERACILLIN-TAZOBACTAM 3.375 G IVPB
3.3750 g | Freq: Three times a day (TID) | INTRAVENOUS | Status: DC
  Administered 2017-01-27 – 2017-02-03 (×23): 3.375 g via INTRAVENOUS
  Filled 2017-01-27 (×25): qty 50

## 2017-01-27 MED ORDER — ORAL CARE MOUTH RINSE
15.0000 mL | OROMUCOSAL | Status: DC
  Administered 2017-01-27 – 2017-01-28 (×17): 15 mL via OROMUCOSAL

## 2017-01-27 MED ORDER — HEPARIN SODIUM (PORCINE) 5000 UNIT/ML IJ SOLN: 5000.0000 [IU] | Freq: Three times a day (TID) | INTRAMUSCULAR | Status: DC

## 2017-01-27 MED ORDER — POTASSIUM CHLORIDE IN NACL 20-0.9 MEQ/L-% IV SOLN
INTRAVENOUS | Status: DC
  Administered 2017-01-27: 19:00:00 via INTRAVENOUS
  Administered 2017-01-28: 75 mL/h via INTRAVENOUS
  Administered 2017-01-29 – 2017-01-30 (×3): via INTRAVENOUS
  Filled 2017-01-27 (×5): qty 1000

## 2017-01-27 MED ORDER — CHLORHEXIDINE GLUCONATE 0.12% ORAL RINSE (MEDLINE KIT)
15.0000 mL | Freq: Two times a day (BID) | OROMUCOSAL | Status: DC
  Administered 2017-01-27 – 2017-01-29 (×6): 15 mL via OROMUCOSAL

## 2017-01-27 MED ORDER — SODIUM CHLORIDE 0.9 % IV SOLN
250.0000 mL | INTRAVENOUS | Status: DC | PRN
  Administered 2017-01-29 – 2017-02-06 (×2): 250 mL via INTRAVENOUS

## 2017-01-27 MED ORDER — BUDESONIDE 0.5 MG/2ML IN SUSP
0.5000 mg | Freq: Two times a day (BID) | RESPIRATORY_TRACT | Status: DC
  Administered 2017-01-27 – 2017-02-09 (×27): 0.5 mg via RESPIRATORY_TRACT
  Filled 2017-01-27 (×29): qty 2

## 2017-01-27 MED ORDER — IPRATROPIUM-ALBUTEROL 0.5-2.5 (3) MG/3ML IN SOLN
3.0000 mL | Freq: Four times a day (QID) | RESPIRATORY_TRACT | Status: DC
  Administered 2017-01-27 – 2017-02-01 (×24): 3 mL via RESPIRATORY_TRACT
  Filled 2017-01-27 (×24): qty 3

## 2017-01-27 MED ORDER — ARFORMOTEROL TARTRATE 15 MCG/2ML IN NEBU
15.0000 ug | INHALATION_SOLUTION | Freq: Two times a day (BID) | RESPIRATORY_TRACT | Status: DC
  Administered 2017-01-27 – 2017-02-09 (×27): 15 ug via RESPIRATORY_TRACT
  Filled 2017-01-27 (×28): qty 2

## 2017-01-27 MED ORDER — PHENYTOIN SODIUM 50 MG/ML IJ SOLN
100.0000 mg | Freq: Two times a day (BID) | INTRAMUSCULAR | Status: DC
  Administered 2017-01-27: 100 mg via INTRAVENOUS
  Filled 2017-01-27: qty 2

## 2017-01-27 MED ORDER — PROPOFOL 1000 MG/100ML IV EMUL
5.0000 ug/kg/min | Freq: Once | INTRAVENOUS | Status: AC
  Administered 2017-01-27: 15 ug/kg/min via INTRAVENOUS

## 2017-01-27 MED ORDER — PHENYTOIN SODIUM 50 MG/ML IJ SOLN
100.0000 mg | INTRAMUSCULAR | Status: DC
  Filled 2017-01-27: qty 2

## 2017-01-27 NOTE — Consult Note (Signed)
Neurology Consultation Reason for Consult: Seizures Referring Physician: Dr Jodi Mourning    History is obtained from: Family and chart review   HPI: Zachary Peterson is a 84 y.o. male with PMH of COPD, Afib on Coumadin, Seizure disorder, HLD, HTN who presents with multiple seizures.   His first seizure was this morning which was witnessed by his wife. Patient was in the bathroom and was having a generalized seizure. Was taken to [facility], a CT head was performed which was negative for any acute abnormality. Phenytoin levels were drawn and therapeutic. Patient was discharged home with no new medications as they felt this was just 1 seizure and patient was back to baseline. He however subsequently had multiple seizures. He also vomited with the seizures and family thinks he may have vomited this morning medications.  On arrival to [facility] ER, patient was back to his baseline seem to be in respiratory distress. He was loaded with 2 g Keppra and had no further clinical seizures. However he continued become toxic and hypercarbic subsequently requiring intubation. Repeat CT head did not show any new changes.    ROS: A 14 point ROS was performed and is negative except as noted in the HPI.   Past Medical History:  Diagnosis Date  . Back pain   . COPD (chronic obstructive pulmonary disease) ([facility])   . Depression   . Fatigue    a. in setting of Afib, improved following DCCV.  [name] Hearing loss   . History of GI bleed 1976  . Hyperlipemia   . Hypertension   . Migraine   . Palpitations   . Persistent atrial fibrillation ([facility])    a. 02/2016 Echo: EF 66-65%, mild MR;  b. CHA2DS2VASc = 3-->coumadin;  c. 05/2016 TEE/DCCV: EF 45-50%, triv AI, mild to mod MR, no LA/LAA/RA/RAA thrombus, mod to sev TR-->successful DCCV.  . Seizure ([facility])   . Syncope    a. related to seizure disorder     Family History  Problem Relation Age of Onset  . Heart disease Mother   . Hypertension Father      Social History:  reports that he has quit smoking. He has quit using smokeless tobacco. He reports that he does not drink alcohol or use drugs.   Exam: Current vital signs: BP (!) 183/150   Pulse (!) 116   Temp 97.8 F (36.6 C) (Rectal)   Resp (!) 28   Ht 5\' 7"  (1.702 m)   Wt 69.4 kg (153 lb)   SpO2 94%   BMI 23.96 kg/m  Vital signs in last 24 hours: Temp:  [97.4 F (36.3 C)-97.8 F (36.6 C)] 97.8 F (36.6 C) (01/19 2047) Pulse Rate:  [58-130] 116 (01/19 2047) Resp:  [12-31] 28 (01/19 2047) BP: (143-207)/(74-150) 183/150 (01/19 2030) SpO2:  [86 %-100 %] 94 % (01/19 2353) FiO2 (%):  [40 %-55 %] 40 % (01/19 2353) Weight:  [69.4 kg (153 lb)] 69.4 kg (153 lb) (01/19 2000)   Physical Exam  Constitutional: Appears well-developed and well-nourished.  Psych: Affect appropriate to situation Eyes: No scleral injection HENT: No OP obstrucion Head: Normocephalic.  Cardiovascular: Normal rate and regular rhythm.  Respiratory: -labored breathing+ GI: Soft.  No distension. There is no tenderness.  Skin: WDI  Neuro: Mental Status: Patient is awake, alert, oriented to person, place, month, year, and situation. Patient is able to give a clear and coherent history. No signs of aphasia or neglect* Cranial Nerves: II: Visual Fields are full. Pupils are  equal, round, and reactive to light.   III,IV, VI: EOMI without ptosis or diploplia.  V: Facial sensation is symmetric to temperature VII: Facial movement is symmetric.  VIII: hearing is intact to voice X: Uvula elevates symmetrically XI: Shoulder shrug is symmetric. XII: tongue is midline without atrophy or fasciculations.  Motor: Tone is normal. Bulk is normal. 5/5 strength was present in all four extremities.  Sensory: Sensation is symmetric to light touch and temperature in the arms and legs. Deep Tendon Reflexes: 2+ and symmetric in the biceps and patellae.  Plantars: Toes are downgoing bilaterally.     I have  reviewed labs in epic and the results pertinent to this consultation are: Phenytoin level was therapeutic   I have reviewed the images obtained: CT head, no hemorrhage   ASSESSMENT  AND PLAN  Multiple seizures   Possibly from vomiting medications following earlier seizure  Loaded  with 2g of IV Keppra, continue 500 mg BID Continue Phenytoin  Home dose  Schedule Ativan 2mg  TID patient is on home valium 5mg  BID and also likely threw up his medication Seizure precaution Treat underlying infection/aspiration pneumonia/COPD exacerbation

## 2017-01-27 NOTE — Progress Notes (Signed)
eLink Physician-Brief Progress Note Patient Name: Zachary RavelSamuel D Peterson DOB: December 02, 1931 MRN: [id number]   Date of Service  01/27/2017  HPI/Events of Note  Multiple issues: 1. Sinus Tachycardia - HR = 124 and 2. Oliguria - Bladder scan with 400 mL residual.   eICU Interventions  Will order: 1. Bolus with 0.9 NaCl 1 liter IV over 1 hour now.  2. I/O Cath PRN.     Intervention Category Major Interventions: Arrhythmia - evaluation and management Intermediate Interventions: Oliguria - evaluation and management  Sommer,Steven Eugene 01/27/2017, 10:20 PM

## 2017-01-27 NOTE — Progress Notes (Signed)
Pt transported to 4n 26 without event.  Rt will monitor.

## 2017-01-27 NOTE — Progress Notes (Signed)
CRITICAL VALUE ALERT  Critical Value:  Lactic acid 5.2  Date & Time Notied:  01/27/2017 1023  Provider Notified: Dr. Ardeth PerfectJeong  Orders Received/Actions taken: No new orders at this time

## 2017-01-27 NOTE — Procedures (Signed)
EEG Report  Clinical History:  Multiple generalized seizures over the last 24 hours.  Technical Summary:  A 19 channel digital EEG recording was performed using the 10-20 international system of electrode placement.  Bipolar and Referential montages were used.  The total recording time was approx 20 minutes.  Findings:  There is no posterior dominant alpha rhythm.  Generalized background frequencies are about 5 Hz and symmetrical.  No focal slowing is present.  No epileptiform discharges or electrographic seizures are present.  Impression:  This is an abnormal EEG.  There is moderate generalized slowing of brain activity which is non-specific but may due to underlying sedation, post-ictal phenomenon after repeated seizures, toxic, metabolic, or infectious etiologies.  The patient is not in non-convulsive status epilepticus.   Weston SettleShervin Lealand Elting, MS, MD

## 2017-01-27 NOTE — Progress Notes (Signed)
Subjective: PT currently sedated on Propofol and intubated. Not able to speak att his time, but does grimace and winces to stimuli. Unable to provide ROS due to sedation. PT's family at bedside and able to provide HPI and ROS. Per family, pt has had hx of seizure since he was a teenager and averaging a seizure episode once year or every other year. He has been compliant with his medications. Pt does have hx of tonic clonic seizures with body stiffening and most often his seizures are  precipitated by nausea and vomiting often before or sometimes after.  They state that today, patient hasn't had a seizure or witnessed one, but, but they noticed a new head twitch when off sedation.  Off note, pt has been on Keppra before, but stopped because he didn't like how it made him feel and family not aware of the symptoms.   Exam: Vitals:   01/27/17 1200 01/27/17 1240  BP: (!) 127/56   Pulse: 98 98  Resp: 15 16  Temp:    SpO2: 99% 99%    Physical Exam   HEENT-  Normocephalic, no lesions, without obvious abnormality.  Normal external eye and conjunctiva.    Cardiovascular- tachycardic rate and rhythm, S1-S2 audible, pulses palpable throughout   Lungs- Pt intubated, -non labored breathing with mechanical breath sound audible.  Abdomen- non tender with normal bowel sounds Extremities- Warm, dry and intact Musculoskeletal-no joint tenderness trace edema in BLE Skin-warm and dry, no hyperpigmentation, vitiligo, or suspicious lesions  Neuro:  Mental Status: Pt is currently sedated on 11mg Propofol, intubated and on mechanical vent,  but very easily arousable. He opens his eyes to commands and minimally able to follow commands to squeeze fingers. Not able to assess speech due to being intubated, but does have a gag reflex with coughing. PT winces during lab venipuncture Cranial Nerves: II: Discs flat bilaterally; Visual fields grossly normal,  III,IV, VI: ptosis not present, bilaterally pupils equal,  round, reactive to light, not able to accurately assess visual field, and noted attempt tp to move eyes up and down V,VII: able to grimace when face pinched, no facial asymmetry  VIII: normal hearing to speech IX,X: unable to assess uvula,  gag reflex present - pt coughing XKZ:SWFUXNto assess  XII: unable to assess, but pt does have tongue movement and constantly chewing ET tube Motor: Generalized weakness due to being on sedated, but did attempt to follow commands to assess motor strength Right : Upper extremity   1/5    Left:     Upper extremity   1/5  Lower extremity   1/5     Lower extremity  1/5 Tone and bulk:normal tone throughout; no atrophy noted Sensory: unable to accurately assess, but pt does grimaces with mild pinch Deep Tendon Reflexes: 2+ and symmetric throughout Plantars: Right: downgoing   Left: downgoing Cerebellar: Unable to accurately assess due to sedation Gait: unable to accurately assess due to sedation     Medications:  Current Facility-Administered Medications:  .  0.9 %  sodium chloride infusion, 250 mL, Intravenous, PRN, EOmar Person NP .  arformoterol (BROVANA) nebulizer solution 15 mcg, 15 mcg, Nebulization, BID, Eubanks, Katalina M, NP, 15 mcg at 01/27/17 0830 .  budesonide (PULMICORT) nebulizer solution 0.5 mg, 0.5 mg, Nebulization, BID, Eubanks, Katalina M, NP, 0.5 mg at 01/27/17 0830 .  chlorhexidine gluconate (MEDLINE KIT) (PERIDEX) 0.12 % solution 15 mL, 15 mL, Mouth Rinse, BID, Scatliffe, Kristen D, MD, 15 mL at 01/27/17 0824 .  fentaNYL (SUBLIMAZE) injection 50 mcg, 50 mcg, Intravenous, Q15 min PRN, Dewaine Oats, Katalina M, NP .  fentaNYL (SUBLIMAZE) injection 50 mcg, 50 mcg, Intravenous, Q2H PRN, Dewaine Oats, Katalina M, NP .  ipratropium-albuterol (DUONEB) 0.5-2.5 (3) MG/3ML nebulizer solution 3 mL, 3 mL, Nebulization, Q6H, Eubanks, Danie Chandler, NP, 3 mL at 01/27/17 1322 .  LORazepam (ATIVAN) injection 1 mg, 1 mg, Intravenous, PRN, Kerney Elbe, MD .   LORazepam (ATIVAN) injection 2 mg, 2 mg, Intravenous, Q8H, Scatliffe, Kristen D, MD, 2 mg at 01/27/17 0556 .  MEDLINE mouth rinse, 15 mL, Mouth Rinse, 10 times per day, Scatliffe, Kristen D, MD, 15 mL at 01/27/17 1209 .  metoprolol tartrate (LOPRESSOR) injection 5 mg, 5 mg, Intravenous, Q6H PRN, Omar Person, NP .  pantoprazole (PROTONIX) injection 40 mg, 40 mg, Intravenous, Daily, Hayden Pedro M, NP, 40 mg at 01/27/17 0949 .  phenytoin (DILANTIN) injection 100 mg, 100 mg, Intravenous, BID, 100 mg at 01/27/17 0949 **AND** phenytoin (DILANTIN) injection 100 mg, 100 mg, Intravenous, Q48H, Scatliffe, Kristen D, MD .  piperacillin-tazobactam (ZOSYN) IVPB 3.375 g, 3.375 g, Intravenous, Q8H, Renee Pain, MD, Last Rate: 12.5 mL/hr at 01/27/17 1204, 3.375 g at 01/27/17 1204 .  propofol (DIPRIVAN) 1000 MG/100ML infusion, 5-50 mcg/kg/min, Intravenous, Continuous, Eubanks, Katalina M, NP, Last Rate: 10.4 mL/hr at 01/27/17 1321, 25 mcg/kg/min at 01/27/17 1321  Pertinent Labs/Diagnostics:  Assessment: Zachary Peterson is a 84 y.o. male with PMH of COPD, Afib on Coumadin, Seizure disorder, HLD, HTN who presents with multiple seizures. His first seizure was on 1/19 which was witnessed by his wife. Patient was in the bathroom and was having a generalized seizure. Was taken to [facility], a CT head was performed which was negative for any acute abnormality. Phenytoin levels were drawn and therapeutic. Patient was discharged home with no new medications as they felt this was just 1 seizure and patient was back to baseline. He however subsequently had multiple seizures. He also vomited with the seizures and family thinks he may have vomited this morning medications. On arrival to [facility] ER, patient was back to his baseline seem to be in respiratory distress. He was loaded with 2 g Keppra and had no further clinical seizures. However he continued become toxic and hypercarbic subsequently  requiring intubation. Repeat CT head did not show any new changes.  1. Multiples seizures in patient with hx of one a year or every other year seizure episodes- Possibly from vomiting medications following earlier seizure. Loaded  with 2g of IV Keppra, continue 500 mg BID as well as home doses of phenytoin. Daughter at bedside states he hasn't had a seizure since sedation, but had noticeable head  twithces while on  sedation vacation this am 2. Acute Hypoxic/Hypercarbic Respiratory Failure, Concern for aspiration event/COPD - currently intubated 3. Systolic HF (EF 41-74),  HTN, HLD 4. H/O A.Fib on Coumadin- coumadin held while intubated, pt on heparin drip  Recommendations: 1. Continue Keppra 500 mg BID as well as home does of Phenytoin; continue scheduled Ativan 21m TID patient is on home valium 512mBID  2. Seizure precaution 3. EEG pending 4. Consider further neuro testing and adjustment of medication post sedation and extubation if seizures recur 5. Continue PCCM management of metabolic disorders as well as vent support   PeJacob MooresNP Neuro-hospitalist Team [id number]/20/2019, 1:34 PM  Electronically signed: Dr. ErKerney Elbe

## 2017-01-27 NOTE — H&P (Signed)
PULMONARY / CRITICAL CARE MEDICINE   Name: Zachary Peterson MRN: [id number] DOB: 12-13-1931    ADMISSION DATE:  01/26/2017 CONSULTATION DATE:  01/27/2017  REFERRING MD:  Dr. August Saucerean    CHIEF COMPLAINT:  Seizure   HISTORY OF PRESENT ILLNESS:   84 year old male with PMH of COPD, Depression, HLD, HTN, A.fib on Coumadin, Epilepsy, Systolic HF   Presents to OSH on 1/19 after a witnessed seizure and fall. CT Head was negative. Phenytoin level therapeutic. Discharged home. Later in then day EMS was called as patient was in respiratory distress and had a witnessed tonic clonic seizure. Was found with emesis in his mouth, placed on CPAP for hypoxia. Family reports 6-10 seizures for last 24 hours that last 30-120 seconds. Upon arrival to ED patient remained hypoxic, CXR with no acute process. Loaded with Keppra. Neurology consulted. CT A/P with possible colitis. Patient was placed on nonrebreather with significant tachycardia and tachypnea. VBG with CO2 of 89.5. Intubated. PCCM asked to admit.   PAST MEDICAL HISTORY :  He  has a past medical history of Back pain, COPD (chronic obstructive pulmonary disease) (HCC), Depression, Fatigue, Hearing loss, History of GI bleed (1976), Hyperlipemia, Hypertension, Migraine, Palpitations, Persistent atrial fibrillation (HCC), Seizure (HCC), and Syncope.  PAST SURGICAL HISTORY: He  has a past surgical history that includes Hernia repair; Appendectomy; Back surgery; Cataract extraction; hemmoridectomy; TEE without cardioversion (N/A, 06/06/2016); and CARDIOVERSION (N/A, 06/06/2016).  Allergies  Allergen Reactions  . Aspirin Other (See Comments)    CAUSES ULCERS  . Codeine Nausea And Vomiting  . Oxycodone Nausea And Vomiting    No current facility-administered medications on file prior to encounter.    Current Outpatient Medications on File Prior to Encounter  Medication Sig  . bisoprolol (ZEBETA) 5 MG tablet Take 0.5 tablets (2.5 mg total) by mouth 2 (two) times  daily.  . calcium carbonate (TUMS - DOSED IN MG ELEMENTAL CALCIUM) 500 MG chewable tablet Chew 1 tablet (200 mg of elemental calcium total) by mouth 3 (three) times daily with meals as needed for indigestion or heartburn.  . diazepam (VALIUM) 5 MG tablet Take 5 mg by mouth 2 (two) times daily.   [name]. DILANTIN 100 MG ER capsule Take 100mg  by mouth 3 times daily then the next day take 100mg s by mouth 2 times daily,alternating tid and bid every other day  . ezetimibe-simvastatin (VYTORIN) 10-80 MG tablet Take 1 tablet by mouth daily.  . fluticasone (FLONASE) 50 MCG/ACT nasal spray Place 2 sprays into both nostrils daily as needed for allergies.   . hydroxypropyl methylcellulose / hypromellose (ISOPTO TEARS / GONIOVISC) 2.5 % ophthalmic solution Place 1 drop into both eyes 3 (three) times daily as needed for dry eyes.  [name]. ibuprofen (ADVIL,MOTRIN) 200 MG tablet Take 200 mg by mouth every 6 (six) hours as needed for headache or moderate pain.   . mirtazapine (REMERON) 15 MG tablet Take 15 mg by mouth at bedtime.  . ondansetron (ZOFRAN ODT) 8 MG disintegrating tablet Take 1 tablet (8 mg total) by mouth every 8 (eight) hours as needed for nausea or vomiting.  . pantoprazole (PROTONIX) 20 MG tablet Take 20 mg by mouth daily.  . pregabalin (LYRICA) 75 MG capsule Take 75 mg by mouth 2 (two) times daily.  [name]. tiotropium (SPIRIVA) 18 MCG inhalation capsule Place 18 mcg into inhaler and inhale daily.  [name]. warfarin (COUMADIN) 2.5 MG tablet TAKE 1 OR 2 TABLETS BY MOUTH EVERY DAY OR AS PHYSICIAN INSTRUCTED BY COUMADIN  CLINIC  . furosemide (LASIX) 20 MG tablet Take 1 tablet (20 mg total) by mouth daily as needed. (Patient not taking: Reported on 01/26/2017)  . HYDROcodone-acetaminophen (NORCO) 5-325 MG per tablet Take 1 tablet by mouth every 6 (six) hours as needed for moderate pain. (Patient not taking: Reported on 01/26/2017)    FAMILY HISTORY:  His indicated that the status of his mother is unknown. He indicated that the  status of his father is unknown.   SOCIAL HISTORY: He  reports that he has quit smoking. He has quit using smokeless tobacco. He reports that he does not drink alcohol or use drugs.  REVIEW OF SYSTEMS:   Unable to review as patient is intubated and sedated   SUBJECTIVE:   VITAL SIGNS: BP (!) 169/73   Pulse (!) 121   Temp 97.8 F (36.6 C) (Rectal)   Resp (!) 23   Ht 5\' 7"  (1.702 m)   Wt 69.4 kg (153 lb)   SpO2 95%   BMI 23.96 kg/m   HEMODYNAMICS:    VENTILATOR SETTINGS: Vent Mode: PRVC FiO2 (%):  [40 %-55 %] 40 % Set Rate:  [16 bmp] 16 bmp Vt Set:  [530 mL] 530 mL PEEP:  [5 cmH20] 5 cmH20 Plateau Pressure:  [14 cmH20] 14 cmH20  INTAKE / OUTPUT: No intake/output data recorded.  PHYSICAL EXAMINATION: General:  Elderly male, on vent  Neuro:  Sedated, +gag/cough, withdrawals from pain, pupils intact  HEENT:  ETT in place  Cardiovascular:  Irregular, no MRG  Lungs:  Clear breath sounds, no wheeze Abdomen:  Non-distended, active bowel sounds  Musculoskeletal:  -edema  Skin:  Warm, dry, intact   LABS:  BMET Recent Labs  Lab 01/26/17 1424 01/26/17 2002  NA 137 136  K 3.8 3.9  CL 106 99*  CO2 24 25  BUN 18 14  CREATININE 1.26* 1.07  GLUCOSE 136* 154*    Electrolytes Recent Labs  Lab 01/26/17 1424 01/26/17 2002  CALCIUM 8.2* 8.7*    CBC Recent Labs  Lab 01/26/17 1424 01/26/17 2002  WBC 5.7 9.7  HGB 13.4 15.3  HCT 40.3 44.5  PLT 258 242    Coag's Recent Labs  Lab 01/26/17 2002  INR 2.02    Sepsis Markers Recent Labs  Lab 01/26/17 2017 01/26/17 2344  LATICACIDVEN 2.43* 2.28*    ABG Recent Labs  Lab 01/27/17 0052  PHART 7.219*  PCO2ART 59.9*  PO2ART 78.0*    Liver Enzymes Recent Labs  Lab 01/26/17 1424 01/26/17 2002  AST 25 25  ALT 11* 13*  ALKPHOS 120 150*  BILITOT 0.7 0.6  ALBUMIN 3.6 4.3    Cardiac Enzymes Recent Labs  Lab 01/26/17 1432 01/26/17 2002  TROPONINI <0.03 <0.03    Glucose No results for  input(s): GLUCAP in the last 168 hours.  Imaging Ct Head Wo Contrast  Result Date: 01/26/2017 CLINICAL DATA:  New onset seizure EXAM: CT HEAD WITHOUT CONTRAST TECHNIQUE: Contiguous axial images were obtained from the base of the skull through the vertex without intravenous contrast. COMPARISON:  Head CT 01/26/2017 at 3:19 p.m. FINDINGS: Brain: No mass lesion, intraparenchymal hemorrhage or extra-axial collection. No evidence of acute cortical infarct. There is periventricular hypoattenuation compatible with chronic microvascular disease. There is generalized atrophy. Old bilateral basal ganglia lacunar infarcts. Vascular: No hyperdense vessel or unexpected calcification. Skull: Normal visualized skull base, calvarium and extracranial soft tissues. Sinuses/Orbits: No sinus fluid levels or advanced mucosal thickening. No mastoid effusion. Normal orbits. IMPRESSION: Unchanged examination without  acute intracranial abnormality. Generalized volume loss and sequelae of chronic ischemic microangiopathy. Electronically Signed   By: Deatra Robinson M.D.   On: 01/26/2017 22:32   Ct Head Wo Contrast  Result Date: 01/26/2017 CLINICAL DATA:  Pt came to Ed via EMS from home. Pt had unwitnessed seizure. Found in bathroom by wife. Postictal upon EMS arrival. Pt has history of seizures and afib. EXAM: CT HEAD WITHOUT CONTRAST TECHNIQUE: Contiguous axial images were obtained from the base of the skull through the vertex without intravenous contrast. COMPARISON:  02/23/2016 FINDINGS: Brain: No acute intracranial hemorrhage. No focal mass lesion. No CT evidence of acute infarction. No midline shift or mass effect. No hydrocephalus. Basilar cisterns are patent. There are periventricular and subcortical white matter hypodensities. Generalized cortical atrophy. Vascular: No hyperdense vessel or unexpected calcification. Skull: Normal. Negative for fracture or focal lesion. Sinuses/Orbits: Paranasal sinuses and mastoid air cells are  clear. Orbits are clear. Other: None. IMPRESSION: 1. No acute intracranial findings.  No change from prior. 2. Atrophy, mild ventricular dilatation and mild white matter microvascular disease again noted. Electronically Signed   By: Genevive Bi M.D.   On: 01/26/2017 15:41   Ct Abdomen Pelvis W Contrast  Result Date: 01/26/2017 CLINICAL DATA:  Nausea and vomiting. EXAM: CT ABDOMEN AND PELVIS WITH CONTRAST TECHNIQUE: Multidetector CT imaging of the abdomen and pelvis was performed using the standard protocol following bolus administration of intravenous contrast. CONTRAST:  ISOVUE-300 IOPAMIDOL (ISOVUE-300) INJECTION 61% COMPARISON:  04/12/2016 scratch set 04/13/2014 FINDINGS: Lower chest: No acute abnormality. Hepatobiliary: No suspicious liver abnormality. Numerous tiny stones are identified within the gallbladder. No gallbladder wall thickening or pericholecystic fluid. No biliary dilatation identified. Pancreas: Unremarkable. No pancreatic ductal dilatation or surrounding inflammatory changes. Spleen: Normal in size without focal abnormality. Adrenals/Urinary Tract: The adrenal glands are normal. Unremarkable appearance of both kidneys. No mass or hydronephrosis. Moderate distension of the urinary bladder. Stomach/Bowel: Small hiatal hernia. The stomach has a normal course and caliber. No bowel obstruction. Scattered colonic diverticula identified. There is mild wall thickening from the distal transverse colon to the sigmoid colon. No pneumatosis or perforation. No significant fat stranding or free fluid. Vascular/Lymphatic: Aortic atherosclerosis. No aneurysm. No adenopathy within the abdomen or pelvis. Reproductive: Prostate gland measures 5.6 by 3.7 by 3.6 cm (volume = 39 cm^3). Other: There is a left inguinal hernia which contains a nonobstructed loop of small bowel, image 82 of series 3. Status post repair of right inguinal hernia. No free fluid or fluid collections identified. Musculoskeletal:  Mild scoliosis and degenerative disc disease noted within the lumbar spine. IMPRESSION: 1. Mild wall thickening involving the left colon noted. The appearance is nonspecific in may reflect incomplete distention. Inflammatory or infectious colitis not excluded. Clinical correlation advise. 2. Left inguinal hernia containing nonobstructed loops of small bowel. 3. Gallstones.  No gallbladder wall thickening identified. 4.  Aortic Atherosclerosis (ICD10-I70.0). 5. Scoliosis and lumbar spondylosis. Electronically Signed   By: Signa Kell M.D.   On: 01/26/2017 22:37   Dg Chest Portable 1 View  Result Date: 01/27/2017 CLINICAL DATA:  Endotracheal intubation EXAM: PORTABLE CHEST 1 VIEW COMPARISON:  01/26/2017 at 8:02 p.m. FINDINGS: Endotracheal tube tip is at the level of the clavicular heads. Orogastric tube tip is below the diaphragm. Bibasilar atelectasis. Unchanged cardiomediastinal contours. IMPRESSION: Endotracheal tube tip at the level of the clavicular heads. Electronically Signed   By: Deatra Robinson M.D.   On: 01/27/2017 00:17   Dg Chest Portable 1 View  Result  Date: 01/26/2017 CLINICAL DATA:  Seizures.  Shortness of breath. EXAM: PORTABLE CHEST 1 VIEW COMPARISON:  04/13/2014 FINDINGS: The heart size and mediastinal contours are within normal limits. Both lungs are clear. The visualized skeletal structures are unremarkable. IMPRESSION: No active disease. Electronically Signed   By: Signa Kell M.D.   On: 01/26/2017 20:39   Dg Abd Portable 1 View  Result Date: 01/27/2017 CLINICAL DATA:  Orogastric tube placement EXAM: PORTABLE ABDOMEN - 1 VIEW COMPARISON:  None. FINDINGS: Orogastric tube tip and side port are in the stomach. The tip is probably near the gastroduodenal junction. IMPRESSION: Orogastric tube tip and side-port in the stomach. Electronically Signed   By: Deatra Robinson M.D.   On: 01/27/2017 00:19     STUDIES:  CT Head 1/19 > No acute, Atrophy, mild ventricular dilation and mild  white matter microvascular disease noted  CXR 1/19 > No acute  CT A/P 1/19 > 1. Mild wall thickening involving the left colon noted. The appearance is nonspecific in may reflect incomplete distention. Inflammatory or infectious colitis not excluded. Clinical correlation advise. Left inguinal hernia containing nonobstructed loops of small bowel.Gallstones.  No gallbladder wall thickening identified. Aortic Atherosclerosis (ICD10-I70.0). Scoliosis and lumbar spondylosis.   CULTURES: Sputum 1/20 >>   ANTIBIOTICS: None.   SIGNIFICANT EVENTS: 1/20 > Presents to ED   LINES/TUBES: ETT 1/20 >>  DISCUSSION: 84 year old male presents to ED s/p 6-10 witnessed seizures with progressive hypoxia and concern for aspiration. Intubated.   ASSESSMENT / PLAN:  PULMONARY A: Acute Hypoxic/Hypercarbic Respiratory Failure, Concern for aspiration event  CXR > no acute   COPD > does not appear to have exacerbation P:   Vent Support Trend ABG/CXR Duoneb, Pulmicort, Brovana   CARDIOVASCULAR A:  Systolic HF (EF 45-50)  H/O A.Fib on Coumadin, HTN, HLD P:  Cardiac Monitoring  Hold Coumadin, Heparin gtt when INR <2  Hold bisoprolol > Metoprolol PRN for HR >120  Hold Lasix   RENAL A:   Non-Gap Metabolic Acidosis in setting of Lactic Acidosis  LA 2.43 > 2.28  P:   Trend BMP Replace electrolytes as indicated  Trend LA  KVO Fluids  GASTROINTESTINAL A:   SUP Nausea/Vommiting  -CT A/P with colitis  P:   NPO PPI  HEMATOLOGIC A:   Chronic Anticoagulation in setting of A.Fib  P:  Heparin gtt as above Trend CBC   INFECTIOUS A:   Concern for Aspiration Event  Colitis  WBC 9.7, Afebrile  P:   Trend WBC and Fever Curve  Trend LA and PCT  Aspiration Precautions  Follow Culture Data   ENDOCRINE A:   No issues    P:   Trend Glucose   NEUROLOGIC A:   Seizures (Concern that patient vomited morning medications)  H/O Epilepsy, Depression   P:   RASS goal: 0/-1 Neurology  Following  Seizure Precautions  Loaded with 2 grams of Keppra  Continuing Phenytoin and scheduled ativan (on home Phenytoin and Valium)  Wean Propofol to achieve RASS Fentanyl PRN   FAMILY  - Updates: no family at bedside   - Inter-disciplinary family meet or Palliative Care meeting due by: 02/03/2017    CC Time: 45 minutes  Jovita Kussmaul, AGACNP-BC [facility]  Pgr: [phone]  PCCM Pgr: [phone]

## 2017-01-27 NOTE — Progress Notes (Signed)
eLink Physician-Brief Progress Note Patient Name: Derrick RavelSamuel D Lempke DOB: 02-15-1931 MRN: [id number]   Date of Service  01/27/2017  HPI/Events of Note  Request to review CXR for ETT placement -  ETT tip about 5 cm above the carina.   eICU Interventions  Advance ETT 2 cm and re-secure.      Intervention Category Intermediate Interventions: Diagnostic test evaluation  Taber Sweetser Eugene 01/27/2017, 2:07 AM

## 2017-01-27 NOTE — Progress Notes (Signed)
eLink Physician-Brief Progress Note Patient Name: Zachary RavelSamuel D Peterson DOB: 1931-07-10 MRN: [id number]   Date of Service  01/27/2017  HPI/Events of Note  Lactic Acid = 2.28 --> 3.8. Last LVEF = 55% - 65%.  eICU Interventions  Will bolus with 0.9 NaCl 1 liter IV over 1 hour now.      Intervention Category Major Interventions: Acid-Base disturbance - evaluation and management  Sommer,Steven Eugene 01/27/2017, 5:47 AM

## 2017-01-27 NOTE — Progress Notes (Signed)
EEG Completed; Results Pending  

## 2017-01-27 NOTE — Progress Notes (Signed)
PULMONARY / CRITICAL CARE MEDICINE   Name: Zachary Peterson MRN: [id number] DOB: 1931-05-26    ADMISSION DATE:  01/26/2017 CONSULTATION DATE:  01/27/2017  REFERRING MD:  Dr. August Saucer    CHIEF COMPLAINT:  Seizure   HISTORY OF PRESENT ILLNESS:   84 year old male with PMH of COPD, Depression, HLD, HTN, A.fib on Coumadin, Epilepsy, Systolic HF   Presents to OSH on 1/19 after a witnessed seizure and fall. CT Head was negative. Phenytoin level therapeutic. Discharged home. Later in then day EMS was called as patient was in respiratory distress and had a witnessed tonic clonic seizure. Was found with emesis in his mouth, placed on CPAP for hypoxia. Family reports 6-10 seizures for last 24 hours that last 30-120 seconds. Upon arrival to ED patient remained hypoxic, CXR with no acute process. Loaded with Keppra. Neurology consulted. CT A/P with possible colitis. Patient was placed on nonrebreather with significant tachycardia and tachypnea. VBG with CO2 of 89.5. Intubated. PCCM asked to admit.     SUBJECTIVE:  84 year old with a history of epilepsy currently heavily sedated on mechanical ventilatory support.  VITAL SIGNS: BP (!) 99/52 (BP Location: Right Arm)   Pulse 100   Temp 98.9 F (37.2 C) (Axillary)   Resp 18   Ht 5\' 7"  (1.702 m)   Wt 67.8 kg (149 lb 7.6 oz)   SpO2 98%   BMI 23.41 kg/m   HEMODYNAMICS:    VENTILATOR SETTINGS: Vent Mode: PSV;CPAP FiO2 (%):  [40 %-55 %] 40 % Set Rate:  [16 bmp] 16 bmp Vt Set:  [530 mL] 530 mL PEEP:  [5 cmH20] 5 cmH20 Pressure Support:  [5 cmH20] 5 cmH20 Plateau Pressure:  [14 cmH20-17 cmH20] 17 cmH20  INTAKE / OUTPUT: I/O last 3 completed shifts: In: 1428.2 [I.V.:208.2; IV Piggyback:1220] Out: 1600 [Urine:1600]  PHYSICAL EXAMINATION: General: Elderly male sedated on vent HEENT: Endotracheal tube connected to ventilator, gastric tube in place PSY: Sedated  neuro: Grimaces to noxious stimuli CV: Heart sounds are regular PULM:  even/non-labored, lungs bilaterally coarse rhonchi HQ:IONG, non-tender, bsx4 active  Extremities: warm/dry, 1+ edema  Skin: no rashes or lesions   LABS:  BMET Recent Labs  Lab 01/26/17 1424 01/26/17 2002 01/27/17 0420  NA 137 136 137  K 3.8 3.9 3.8  CL 106 99* 104  CO2 24 25 19*  BUN 18 14 12   CREATININE 1.26* 1.07 1.23  GLUCOSE 136* 154* 134*    Electrolytes Recent Labs  Lab 01/26/17 1424 01/26/17 2002 01/27/17 0420  CALCIUM 8.2* 8.7* 8.1*  MG  --   --  1.8  PHOS  --   --  3.4    CBC Recent Labs  Lab 01/26/17 1424 01/26/17 2002 01/27/17 0420  WBC 5.7 9.7 11.3*  HGB 13.4 15.3 14.0  HCT 40.3 44.5 42.1  PLT 258 242 279    Coag's Recent Labs  Lab 01/26/17 2002 01/27/17 0420  INR 2.02 2.18    Sepsis Markers Recent Labs  Lab 01/26/17 2017 01/26/17 2344 01/27/17 0420  LATICACIDVEN 2.43* 2.28* 3.8*  PROCALCITON  --   --  1.98    ABG Recent Labs  Lab 01/27/17 0052  PHART 7.219*  PCO2ART 59.9*  PO2ART 78.0*    Liver Enzymes Recent Labs  Lab 01/26/17 1424 01/26/17 2002  AST 25 25  ALT 11* 13*  ALKPHOS 120 150*  BILITOT 0.7 0.6  ALBUMIN 3.6 4.3    Cardiac Enzymes Recent Labs  Lab 01/26/17 1432 01/26/17 2002  TROPONINI <  0.03 <0.03    Glucose No results for input(s): GLUCAP in the last 168 hours.  Imaging Ct Head Wo Contrast  Result Date: 01/26/2017 CLINICAL DATA:  New onset seizure EXAM: CT HEAD WITHOUT CONTRAST TECHNIQUE: Contiguous axial images were obtained from the base of the skull through the vertex without intravenous contrast. COMPARISON:  Head CT 01/26/2017 at 3:19 p.m. FINDINGS: Brain: No mass lesion, intraparenchymal hemorrhage or extra-axial collection. No evidence of acute cortical infarct. There is periventricular hypoattenuation compatible with chronic microvascular disease. There is generalized atrophy. Old bilateral basal ganglia lacunar infarcts. Vascular: No hyperdense vessel or unexpected calcification. Skull:  Normal visualized skull base, calvarium and extracranial soft tissues. Sinuses/Orbits: No sinus fluid levels or advanced mucosal thickening. No mastoid effusion. Normal orbits. IMPRESSION: Unchanged examination without acute intracranial abnormality. Generalized volume loss and sequelae of chronic ischemic microangiopathy. Electronically Signed   By: Deatra Robinson M.D.   On: 01/26/2017 22:32   Ct Head Wo Contrast  Result Date: 01/26/2017 CLINICAL DATA:  Pt came to Ed via EMS from home. Pt had unwitnessed seizure. Found in bathroom by wife. Postictal upon EMS arrival. Pt has history of seizures and afib. EXAM: CT HEAD WITHOUT CONTRAST TECHNIQUE: Contiguous axial images were obtained from the base of the skull through the vertex without intravenous contrast. COMPARISON:  02/23/2016 FINDINGS: Brain: No acute intracranial hemorrhage. No focal mass lesion. No CT evidence of acute infarction. No midline shift or mass effect. No hydrocephalus. Basilar cisterns are patent. There are periventricular and subcortical white matter hypodensities. Generalized cortical atrophy. Vascular: No hyperdense vessel or unexpected calcification. Skull: Normal. Negative for fracture or focal lesion. Sinuses/Orbits: Paranasal sinuses and mastoid air cells are clear. Orbits are clear. Other: None. IMPRESSION: 1. No acute intracranial findings.  No change from prior. 2. Atrophy, mild ventricular dilatation and mild white matter microvascular disease again noted. Electronically Signed   By: Genevive Bi M.D.   On: 01/26/2017 15:41   Ct Abdomen Pelvis W Contrast  Result Date: 01/26/2017 CLINICAL DATA:  Nausea and vomiting. EXAM: CT ABDOMEN AND PELVIS WITH CONTRAST TECHNIQUE: Multidetector CT imaging of the abdomen and pelvis was performed using the standard protocol following bolus administration of intravenous contrast. CONTRAST:  ISOVUE-300 IOPAMIDOL (ISOVUE-300) INJECTION 61% COMPARISON:  04/12/2016 scratch set 04/13/2014  FINDINGS: Lower chest: No acute abnormality. Hepatobiliary: No suspicious liver abnormality. Numerous tiny stones are identified within the gallbladder. No gallbladder wall thickening or pericholecystic fluid. No biliary dilatation identified. Pancreas: Unremarkable. No pancreatic ductal dilatation or surrounding inflammatory changes. Spleen: Normal in size without focal abnormality. Adrenals/Urinary Tract: The adrenal glands are normal. Unremarkable appearance of both kidneys. No mass or hydronephrosis. Moderate distension of the urinary bladder. Stomach/Bowel: Small hiatal hernia. The stomach has a normal course and caliber. No bowel obstruction. Scattered colonic diverticula identified. There is mild wall thickening from the distal transverse colon to the sigmoid colon. No pneumatosis or perforation. No significant fat stranding or free fluid. Vascular/Lymphatic: Aortic atherosclerosis. No aneurysm. No adenopathy within the abdomen or pelvis. Reproductive: Prostate gland measures 5.6 by 3.7 by 3.6 cm (volume = 39 cm^3). Other: There is a left inguinal hernia which contains a nonobstructed loop of small bowel, image 82 of series 3. Status post repair of right inguinal hernia. No free fluid or fluid collections identified. Musculoskeletal: Mild scoliosis and degenerative disc disease noted within the lumbar spine. IMPRESSION: 1. Mild wall thickening involving the left colon noted. The appearance is nonspecific in may reflect incomplete distention. Inflammatory or infectious  colitis not excluded. Clinical correlation advise. 2. Left inguinal hernia containing nonobstructed loops of small bowel. 3. Gallstones.  No gallbladder wall thickening identified. 4.  Aortic Atherosclerosis (ICD10-I70.0). 5. Scoliosis and lumbar spondylosis. Electronically Signed   By: Signa Kell M.D.   On: 01/26/2017 22:37   Dg Chest Portable 1 View  Result Date: 01/27/2017 CLINICAL DATA:  Endotracheal intubation EXAM: PORTABLE CHEST 1  VIEW COMPARISON:  01/26/2017 at 8:02 p.m. FINDINGS: Endotracheal tube tip is at the level of the clavicular heads. Orogastric tube tip is below the diaphragm. Bibasilar atelectasis. Unchanged cardiomediastinal contours. IMPRESSION: Endotracheal tube tip at the level of the clavicular heads. Electronically Signed   By: Deatra Robinson M.D.   On: 01/27/2017 00:17   Dg Chest Portable 1 View  Result Date: 01/26/2017 CLINICAL DATA:  Seizures.  Shortness of breath. EXAM: PORTABLE CHEST 1 VIEW COMPARISON:  04/13/2014 FINDINGS: The heart size and mediastinal contours are within normal limits. Both lungs are clear. The visualized skeletal structures are unremarkable. IMPRESSION: No active disease. Electronically Signed   By: Signa Kell M.D.   On: 01/26/2017 20:39   Dg Abd Portable 1 View  Result Date: 01/27/2017 CLINICAL DATA:  Orogastric tube placement EXAM: PORTABLE ABDOMEN - 1 VIEW COMPARISON:  None. FINDINGS: Orogastric tube tip and side port are in the stomach. The tip is probably near the gastroduodenal junction. IMPRESSION: Orogastric tube tip and side-port in the stomach. Electronically Signed   By: Deatra Robinson M.D.   On: 01/27/2017 00:19     STUDIES:  CT Head 1/19 > No acute, Atrophy, mild ventricular dilation and mild white matter microvascular disease noted  CXR 1/19 > No acute  CT A/P 1/19 > 1. Mild wall thickening involving the left colon noted. The appearance is nonspecific in may reflect incomplete distention. Inflammatory or infectious colitis not excluded. Clinical correlation advise. Left inguinal hernia containing nonobstructed loops of small bowel.Gallstones.  No gallbladder wall thickening identified. Aortic Atherosclerosis (ICD10-I70.0). Scoliosis and lumbar spondylosis.   CULTURES: Sputum 1/20 >>   ANTIBIOTICS: None.   SIGNIFICANT EVENTS: 1/20 > Presents to ED   LINES/TUBES: ETT 1/20 >>  DISCUSSION: 84 year old male presents to ED s/p 6-10 witnessed seizures with  progressive hypoxia and concern for aspiration. Intubated.   ASSESSMENT / PLAN:  PULMONARY A: Acute Hypoxic/Hypercarbic Respiratory Failure, Concern for aspiration event  CXR > no acute   COPD > does not appear to have exacerbation P:   Vent Support Trend ABG/CXR Duoneb, Pulmicort, Brovana  01/27/2017 decrease sedation and evaluate for possible extubation. 01/27/2017 mixed metabolic respiratory acidosis may interfere with planned extubation.  We will check ABG at 1200 hrs. later ventilator changes as noted.  CARDIOVASCULAR Lab Results  Component Value Date   INR 2.18 01/27/2017   INR 2.02 01/26/2017   INR 2.6 01/09/2017    A:  Systolic HF (EF 45-50)  H/O A.Fib on Coumadin, HTN, HLD P:  Cardiac Monitoring  Hold Coumadin, Heparin gtt when INR <2  Hold bisoprolol > Metoprolol PRN for HR >120  Hold Lasix   RENAL Lab Results  Component Value Date   CREATININE 1.23 01/27/2017   CREATININE 1.07 01/26/2017   CREATININE 1.26 (H) 01/26/2017   CREATININE 1.16 11/23/2013   Recent Labs  Lab 01/26/17 1424 01/26/17 2002 01/27/17 0420  K 3.8 3.9 3.8    A:   Non-Gap Metabolic Acidosis in setting of Lactic Acidosis  LA 2.43 > 2.28 >3.8 P:   Trend BMP Replace electrolytes as  indicated  Trend LA next lactic acid 01/27/2017 at 1300 hrs. Fluid bolus for elevated lactate  GASTROINTESTINAL A:   SUP Nausea/Vommiting  -CT A/P with colitis  P:   NPO PPI Orogastric tube to intermittent suction  HEMATOLOGIC Lab Results  Component Value Date   INR 2.18 01/27/2017   INR 2.02 01/26/2017   INR 2.6 01/09/2017   Recent Labs    01/26/17 2002 01/27/17 0420  HGB 15.3 14.0    A:   Chronic Anticoagulation in setting of A.Fib  P:  Heparin gtt as above Trend CBC   INFECTIOUS A:   Concern for Aspiration Event  Colitis  WBC 9.7, Afebrile  P:   Trend WBC and Fever Curve  Trend LA and PCT  Aspiration Precautions  Follow Culture Data   ENDOCRINE CBG (last 3)  No  results for input(s): GLUCAP in the last 72 hours.   A:   No issues    P:   Trend Glucose   NEUROLOGIC A:   Seizures (Concern that patient vomited morning medications)  H/O Epilepsy, Depression   P:   RASS goal: 0/-1 Neurology Following  Seizure Precautions  Loaded with 2 grams of Keppra  Continuing Phenytoin and scheduled ativan (on home Phenytoin and Valium)  Wean Propofol to achieve RASS Fentanyl PRN   FAMILY  - Updates: no family at bedside   - Inter-disciplinary family meet or Palliative Care meeting due by: 02/03/2017    CC Time: 30  minutes  Brett CanalesSteve Minor ACNP [facility] Pager [phone] till 1 pm If no answer page [phone] 01/27/2017, 8:45 AM

## 2017-01-27 NOTE — Progress Notes (Signed)
PS was increased to 10 from 5 by MD. Stated we are not planning to extubate so we do not need to leave him on 5/5

## 2017-01-27 NOTE — Progress Notes (Signed)
ANTICOAGULATION CONSULT NOTE - Initial Consult  Pharmacy Consult for Heparin when INR <2 (warfarin on hold) Indication: atrial fibrillation  Allergies  Allergen Reactions  . Aspirin Other (See Comments)    CAUSES ULCERS  . Codeine Nausea And Vomiting  . Oxycodone Nausea And Vomiting   Patient Measurements: Height: 5\' 7"  (170.2 cm) Weight: 153 lb (69.4 kg) IBW/kg (Calculated) : 66.1  Vital Signs: Temp: 97.8 F (36.6 C) (01/19 2047) Temp Source: Rectal (01/19 2047) BP: 183/150 (01/19 2030) Pulse Rate: 116 (01/19 2047)  Labs: Recent Labs    01/26/17 1424 01/26/17 1432 01/26/17 2002  HGB 13.4  --  15.3  HCT 40.3  --  44.5  PLT 258  --  242  LABPROT  --   --  22.7*  INR  --   --  2.02  CREATININE 1.26*  --  1.07  CKTOTAL  --   --  120  TROPONINI  --  <0.03 <0.03   Estimated Creatinine Clearance: 48.9 mL/min (by C-G formula based on SCr of 1.07 mg/dL).  Medical History: Past Medical History:  Diagnosis Date  . Back pain   . COPD (chronic obstructive pulmonary disease) (HCC)   . Depression   . Fatigue    a. in setting of Afib, improved following DCCV.  [name]. Hearing loss   . History of GI bleed 1976  . Hyperlipemia   . Hypertension   . Migraine   . Palpitations   . Persistent atrial fibrillation (HCC)    a. 02/2016 Echo: EF 66-65%, mild MR;  b. CHA2DS2VASc = 3-->coumadin;  c. 05/2016 TEE/DCCV: EF 45-50%, triv AI, mild to mod MR, no LA/LAA/RA/RAA thrombus, mod to sev TR-->successful DCCV.  . Seizure (HCC)   . Syncope    a. related to seizure disorder   Assessment: 84 y/o M on warfarin PTA for afib, presents to the ED with seizures, [facility] same issue, now requiring intubation, holding warfarin and starting heparin when INR is <2, INR on admit is 2.02, I will check another INR with AM labs to make sure INR is not rising in setting of acute illness.   Goal of Therapy:  Heparin level 0.3-0.7 units/ml Monitor platelets by anticoagulation  protocol: Yes   Plan:  Heparin when INR <2  Abran DukeLedford, Aymee Fomby 01/27/2017,12:22 AM

## 2017-01-28 ENCOUNTER — Inpatient Hospital Stay (HOSPITAL_COMMUNITY): Payer: Medicare HMO

## 2017-01-28 LAB — CBC WITH DIFFERENTIAL/PLATELET
Basophils Absolute: 0 10*3/uL (ref 0.0–0.1)
Basophils Relative: 0 %
EOS ABS: 0 10*3/uL (ref 0.0–0.7)
EOS PCT: 0 %
HCT: 40.5 % (ref 39.0–52.0)
Hemoglobin: 13.9 g/dL (ref 13.0–17.0)
LYMPHS ABS: 0.8 10*3/uL (ref 0.7–4.0)
LYMPHS PCT: 7 %
MCH: 32.6 pg (ref 26.0–34.0)
MCHC: 34.3 g/dL (ref 30.0–36.0)
MCV: 95.1 fL (ref 78.0–100.0)
MONO ABS: 0.8 10*3/uL (ref 0.1–1.0)
Monocytes Relative: 8 %
Neutro Abs: 9.1 10*3/uL — ABNORMAL HIGH (ref 1.7–7.7)
Neutrophils Relative %: 85 %
PLATELETS: 190 10*3/uL (ref 150–400)
RBC: 4.26 MIL/uL (ref 4.22–5.81)
RDW: 15.3 % (ref 11.5–15.5)
WBC: 10.7 10*3/uL — AB (ref 4.0–10.5)

## 2017-01-28 LAB — URINALYSIS, ROUTINE W REFLEX MICROSCOPIC
BILIRUBIN URINE: NEGATIVE
Glucose, UA: NEGATIVE mg/dL
Ketones, ur: NEGATIVE mg/dL
Leukocytes, UA: NEGATIVE
NITRITE: NEGATIVE
PH: 5 (ref 5.0–8.0)
Protein, ur: 30 mg/dL — AB
SPECIFIC GRAVITY, URINE: 1.029 (ref 1.005–1.030)

## 2017-01-28 LAB — BASIC METABOLIC PANEL
ANION GAP: 10 (ref 5–15)
BUN: 16 mg/dL (ref 6–20)
CALCIUM: 7.7 mg/dL — AB (ref 8.9–10.3)
CO2: 19 mmol/L — ABNORMAL LOW (ref 22–32)
CREATININE: 1.28 mg/dL — AB (ref 0.61–1.24)
Chloride: 109 mmol/L (ref 101–111)
GFR, EST AFRICAN AMERICAN: 58 mL/min — AB (ref >60)
GFR, EST NON AFRICAN AMERICAN: 50 mL/min — AB (ref >60)
GLUCOSE: 102 mg/dL — AB (ref 65–99)
Potassium: 3.8 mmol/L (ref 3.5–5.1)
Sodium: 138 mmol/L (ref 135–145)

## 2017-01-28 LAB — GLUCOSE, CAPILLARY
GLUCOSE-CAPILLARY: 84 mg/dL (ref 65–99)
GLUCOSE-CAPILLARY: 78 mg/dL (ref 65–99)
GLUCOSE-CAPILLARY: 80 mg/dL (ref 65–99)
GLUCOSE-CAPILLARY: 86 mg/dL (ref 65–99)
Glucose-Capillary: 82 mg/dL (ref 65–99)

## 2017-01-28 LAB — PROTIME-INR
INR: 2.99
PROTHROMBIN TIME: 30.9 s — AB (ref 11.4–15.2)

## 2017-01-28 LAB — POCT I-STAT 3, ART BLOOD GAS (G3+)
ACID-BASE DEFICIT: 6 mmol/L — AB (ref 0.0–2.0)
Bicarbonate: 19.2 mmol/L — ABNORMAL LOW (ref 20.0–28.0)
O2 Saturation: 95 %
PH ART: 7.331 — AB (ref 7.350–7.450)
TCO2: 20 mmol/L — ABNORMAL LOW (ref 22–32)
pCO2 arterial: 37 mmHg (ref 32.0–48.0)
pO2, Arterial: 88 mmHg (ref 83.0–108.0)

## 2017-01-28 LAB — MAGNESIUM: Magnesium: 1.7 mg/dL (ref 1.7–2.4)

## 2017-01-28 LAB — PHOSPHORUS: Phosphorus: 1.8 mg/dL — ABNORMAL LOW (ref 2.5–4.6)

## 2017-01-28 MED ORDER — ACETAMINOPHEN 650 MG RE SUPP
650.0000 mg | RECTAL | Status: DC | PRN
  Administered 2017-01-28 – 2017-02-02 (×8): 650 mg via RECTAL
  Filled 2017-01-28 (×9): qty 1

## 2017-01-28 MED ORDER — ACETAMINOPHEN 160 MG/5ML PO SOLN
650.0000 mg | Freq: Four times a day (QID) | ORAL | Status: DC | PRN
  Administered 2017-01-28: 650 mg
  Filled 2017-01-28 (×2): qty 20.3

## 2017-01-28 MED ORDER — SODIUM CHLORIDE 0.9 % IV BOLUS (SEPSIS)
1000.0000 mL | Freq: Once | INTRAVENOUS | Status: AC
  Administered 2017-01-28: 1000 mL via INTRAVENOUS

## 2017-01-28 MED ORDER — LEVETIRACETAM 500 MG/5ML IV SOLN
500.0000 mg | Freq: Two times a day (BID) | INTRAVENOUS | Status: DC
  Administered 2017-01-28 (×2): 500 mg via INTRAVENOUS
  Filled 2017-01-28 (×3): qty 5

## 2017-01-28 NOTE — Progress Notes (Signed)
Sputum sent to lab

## 2017-01-28 NOTE — Plan of Care (Signed)
Patient appears to be resting comfortably with sedation on vent.

## 2017-01-28 NOTE — Progress Notes (Signed)
PULMONARY / CRITICAL CARE MEDICINE   Name: Zachary Peterson MRN: [id number] DOB: 17-Jun-1931    ADMISSION DATE:  01/26/2017   CHIEF COMPLAINT: Respiratory distress following a seizure.  HISTORY OF PRESENT ILLNESS:        This is an 84 year old with a known seizure disorder which usually manifest as one generalized seizure per year.  He was brought to the emergency room in respiratory distress following a seizure and required intubation and mechanical ventilation.  Family is at the bedside and tell me that he did not have any concurrent acute illness which may have precipitated his seizure.  He was complaining of some abdominal cramping but was otherwise not ill.  I have held his sedation during my exam this morning and he is following directions  for me.  He is in no distress on CPAP 5 with a pressure support of 5.  PAST MEDICAL HISTORY :  He  has a past medical history of Back pain, COPD (chronic obstructive pulmonary disease) (HCC), Depression, Fatigue, Hearing loss, History of GI bleed (1976), Hyperlipemia, Hypertension, Migraine, Palpitations, Persistent atrial fibrillation (HCC), Seizure (HCC), and Syncope.  PAST SURGICAL HISTORY: He  has a past surgical history that includes Hernia repair; Appendectomy; Back surgery; Cataract extraction; hemmoridectomy; TEE without cardioversion (N/A, 06/06/2016); and CARDIOVERSION (N/A, 06/06/2016).  Allergies  Allergen Reactions  . Aspirin Other (See Comments)    CAUSES ULCERS  . Codeine Nausea And Vomiting  . Oxycodone Nausea And Vomiting    No current facility-administered medications on file prior to encounter.    Current Outpatient Medications on File Prior to Encounter  Medication Sig  . bisoprolol (ZEBETA) 5 MG tablet Take 0.5 tablets (2.5 mg total) by mouth 2 (two) times daily.  . calcium carbonate (TUMS - DOSED IN MG ELEMENTAL CALCIUM) 500 MG chewable tablet Chew 1 tablet (200 mg of elemental calcium total) by mouth 3 (three) times daily  with meals as needed for indigestion or heartburn.  . diazepam (VALIUM) 5 MG tablet Take 5 mg by mouth 2 (two) times daily.   [name] DILANTIN 100 MG ER capsule Take 100mg  by mouth 3 times daily then the next day take 100mg s by mouth 2 times daily,alternating tid and bid every other day  . ezetimibe-simvastatin (VYTORIN) 10-80 MG tablet Take 1 tablet by mouth daily.  . fluticasone (FLONASE) 50 MCG/ACT nasal spray Place 2 sprays into both nostrils daily as needed for allergies.   . hydroxypropyl methylcellulose / hypromellose (ISOPTO TEARS / GONIOVISC) 2.5 % ophthalmic solution Place 1 drop into both eyes 3 (three) times daily as needed for dry eyes.  [name] ibuprofen (ADVIL,MOTRIN) 200 MG tablet Take 200 mg by mouth every 6 (six) hours as needed for headache or moderate pain.   . mirtazapine (REMERON) 15 MG tablet Take 15 mg by mouth at bedtime.  . ondansetron (ZOFRAN ODT) 8 MG disintegrating tablet Take 1 tablet (8 mg total) by mouth every 8 (eight) hours as needed for nausea or vomiting.  . pantoprazole (PROTONIX) 20 MG tablet Take 20 mg by mouth daily.  . pregabalin (LYRICA) 75 MG capsule Take 75 mg by mouth 2 (two) times daily.  [name] tiotropium (SPIRIVA) 18 MCG inhalation capsule Place 18 mcg into inhaler and inhale daily.  [name] warfarin (COUMADIN) 2.5 MG tablet TAKE 1 OR 2 TABLETS BY MOUTH EVERY DAY OR AS PHYSICIAN INSTRUCTED BY COUMADIN CLINIC  . furosemide (LASIX) 20 MG tablet Take 1 tablet (20 mg total) by mouth daily as needed. (Patient not  taking: Reported on 01/26/2017)  . HYDROcodone-acetaminophen (NORCO) 5-325 MG per tablet Take 1 tablet by mouth every 6 (six) hours as needed for moderate pain. (Patient not taking: Reported on 01/26/2017)    FAMILY HISTORY:  His indicated that the status of his mother is unknown. He indicated that the status of his father is unknown.   SOCIAL HISTORY: He  reports that he has quit smoking. He has quit using smokeless tobacco. He reports that he does not drink alcohol  or use drugs.  REVIEW OF SYSTEMS:   Unobtainable  SUBJECTIVE:  As above  VITAL SIGNS: BP (!) 143/63   Pulse (!) 105   Temp 99.8 F (37.7 C) (Axillary)   Resp (!) 23   Ht 5\' 7"  (1.702 m)   Wt 149 lb 4 oz (67.7 kg)   SpO2 99%   BMI 23.38 kg/m   HEMODYNAMICS:    VENTILATOR SETTINGS: Vent Mode: PRVC FiO2 (%):  [40 %] 40 % Set Rate:  [24 bmp] 24 bmp Vt Set:  [530 mL] 530 mL PEEP:  [5 cmH20] 5 cmH20 Pressure Support:  [10 cmH20] 10 cmH20 Plateau Pressure:  [15 cmH20-17 cmH20] 15 cmH20  INTAKE / OUTPUT: I/O last 3 completed shifts: In: 2623.9 [I.V.:1303.9; IV Piggyback:1320] Out: 1900 [Urine:1900]  PHYSICAL EXAMINATION: General: Abated and mechanically ventilated and in no distress Neuro: He does follow simple instructions, moving all fours on request.  Pupils are equal and EOMs appear to be full Cardiovascular: S1 and S2 are irregularly irregular without murmur rub or gallop Lungs: Operations are unlabored on a pressure support of 5.  There is symmetric air movement, and no wheezes. Abdomen: The abdomen is soft without organomegaly masses tenderness guarding or rebound   LABS:  BMET Recent Labs  Lab 01/26/17 2002 01/27/17 0420 01/28/17 0741  NA 136 137 138  K 3.9 3.8 3.8  CL 99* 104 109  CO2 25 19* 19*  BUN 14 12 16   CREATININE 1.07 1.23 1.28*  GLUCOSE 154* 134* 102*    Electrolytes Recent Labs  Lab 01/26/17 2002 01/27/17 0420 01/28/17 0741  CALCIUM 8.7* 8.1* 7.7*  MG  --  1.8 1.7  PHOS  --  3.4 1.8*    CBC Recent Labs  Lab 01/26/17 2002 01/27/17 0420 01/28/17 0741  WBC 9.7 11.3* 10.7*  HGB 15.3 14.0 13.9  HCT 44.5 42.1 40.5  PLT 242 279 190    Coag's Recent Labs  Lab 01/27/17 0420 01/27/17 2143 01/28/17 0741  INR 2.18 2.53 2.99    Sepsis Markers Recent Labs  Lab 01/27/17 0420 01/27/17 0836 01/27/17 0900 01/27/17 1258  LATICACIDVEN 3.8* 5.2*  --  4.6*  PROCALCITON 1.98  --  1.93  --     ABG Recent Labs  Lab  01/27/17 0052  PHART 7.219*  PCO2ART 59.9*  PO2ART 78.0*    Liver Enzymes Recent Labs  Lab 01/26/17 1424 01/26/17 2002  AST 25 25  ALT 11* 13*  ALKPHOS 120 150*  BILITOT 0.7 0.6  ALBUMIN 3.6 4.3    Cardiac Enzymes Recent Labs  Lab 01/26/17 1432 01/26/17 2002  TROPONINI <0.03 <0.03    Glucose Recent Labs  Lab 01/28/17 0811  GLUCAP 84    Imaging Dg Chest Port 1 View  Result Date: 01/28/2017 CLINICAL DATA:  Respiratory failure EXAM: PORTABLE CHEST 1 VIEW COMPARISON:  01/26/2017 FINDINGS: Normal heart size. Endotracheal and NG tubes stable. Subsegmental atelectasis at the left base has increased. Lungs otherwise clear. No pneumothorax. IMPRESSION: Increased subsegmental  atelectasis at the left base. Otherwise clear lungs. Electronically Signed   By: Jolaine ClickArthur  Hoss M.D.   On: 01/28/2017 07:30      DISCUSSION:      84 year old with a history of seizure disorder which manifests itself is approximately 1 tonic-clonic seizure per year.  He suffered from a seizure yesterday and was in respiratory distress subsequent to the seizure and was intubated and mechanically ventilated.  He has had no further seizure activity and is interactive this morning.  I am anticipating extubation later today.        ASSESSMENT / PLAN:  PULMONARY A: He is intubated at present primarily for airway protection.  He has a history of COPD.  There was some concern of aspiration on presentation, chest x-ray shows perhaps a very subtle infiltrate at the right base, and I will continue him on Zosyn for the present.  CARDIOVASCULAR A: He has chronic atrial fibrillation and is on no AV blocking agent with reasonable rate control.  He is normally taking Coumadin which will need to be reintroduced.  He did have some transient hypotension last night which for now I am willing to attribute to volume depletion alone.  I have sent serial enzymes and will investigate further if he continues to have needs for  intervention.  GASTROINTESTINAL A: Prophylaxis is with Protonix   NEUROLOGIC A: He is grossly nonfocal at present and appears to be alert enough to extubate when sedation is held.  He has had no further seizure activity.  Continue Dilantin and Keppra for now.  Greater than 32 minutes was spent in the care of this patient today   Penny PiaWJ Ayame Rena, MD Pulmonary and Critical Care Medicine [facility] Pager: [phone]  01/28/2017, 10:31 AM

## 2017-01-28 NOTE — Procedures (Signed)
Extubation Procedure Note  Patient Details:   Name: Derrick RavelSamuel D Rybacki DOB: 07/16/1931 MRN: [id number]       Evaluation  O2 sats: stable throughout Complications: No apparent complications Patient did tolerate procedure well. Bilateral Breath Sounds: Rhonchi   Pt extubated per MD order.  Placed pt on 4L [place] tolerating well. Pt had +cuff leak.  Cherylin MylarDoyle, Marguita Venning 01/28/2017, 11:03 AM

## 2017-01-28 NOTE — Progress Notes (Signed)
eLink Physician-Brief Progress Note Patient Name: Zachary RavelSamuel D Peterson DOB: 02/23/1931 MRN: [id number]   Date of Service  01/28/2017  HPI/Events of Note  Called d/t fever to 100.7 F - Already on Zosyn for presumption of aspiration. AST and ALT both normal.   eICU Interventions  Will order: 1. UA with reflex microscopic.  2. Tracheal aspirate culture.  3. Blood cultures X 2. 4. Tylenol liquid 650 mg per tube Q 6 hours PRN Temp > 100.0 F.     Intervention Category Major Interventions: Infection - evaluation and management  Sommer,Steven Eugene 01/28/2017, 2:27 AM

## 2017-01-28 NOTE — Progress Notes (Addendum)
eLink Physician-Brief Progress Note Patient Name: Zachary RavelSamuel D Lick DOB: 08-08-1931 MRN: [id number]   Date of Service  01/28/2017  HPI/Events of Note  Hypotension - BP dropped from 150/75 --> 73/40 s/p Metoprolol 5 mg IV for HR in 120's, LVEF = 55% to 65%.  eICU Interventions  Will order: 1. 0.9 NaCl 1 liter IV over 1 hour now.  2. D/C Metoprolol IV.      Intervention Category Major Interventions: Hypotension - evaluation and management  Sommer,Steven Eugene 01/28/2017, 3:36 AM

## 2017-01-29 ENCOUNTER — Inpatient Hospital Stay (HOSPITAL_COMMUNITY): Payer: Medicare HMO

## 2017-01-29 DIAGNOSIS — R569 Unspecified convulsions: Secondary | ICD-10-CM

## 2017-01-29 LAB — BLOOD CULTURE ID PANEL (REFLEXED)
ACINETOBACTER BAUMANNII: NOT DETECTED
CANDIDA GLABRATA: NOT DETECTED
CANDIDA TROPICALIS: NOT DETECTED
Candida albicans: NOT DETECTED
Candida krusei: NOT DETECTED
Candida parapsilosis: NOT DETECTED
Carbapenem resistance: NOT DETECTED
ENTEROCOCCUS SPECIES: NOT DETECTED
Enterobacter cloacae complex: NOT DETECTED
Enterobacteriaceae species: NOT DETECTED
Escherichia coli: NOT DETECTED
HAEMOPHILUS INFLUENZAE: NOT DETECTED
Klebsiella oxytoca: NOT DETECTED
Klebsiella pneumoniae: NOT DETECTED
LISTERIA MONOCYTOGENES: NOT DETECTED
METHICILLIN RESISTANCE: NOT DETECTED
NEISSERIA MENINGITIDIS: NOT DETECTED
PROTEUS SPECIES: NOT DETECTED
Pseudomonas aeruginosa: NOT DETECTED
SERRATIA MARCESCENS: NOT DETECTED
STAPHYLOCOCCUS AUREUS BCID: NOT DETECTED
STAPHYLOCOCCUS SPECIES: DETECTED — AB
STREPTOCOCCUS SPECIES: NOT DETECTED
Streptococcus agalactiae: NOT DETECTED
Streptococcus pneumoniae: NOT DETECTED
Streptococcus pyogenes: NOT DETECTED

## 2017-01-29 LAB — CULTURE, RESPIRATORY W GRAM STAIN

## 2017-01-29 LAB — COMPREHENSIVE METABOLIC PANEL
ALK PHOS: 82 U/L (ref 38–126)
ALT: 12 U/L — ABNORMAL LOW (ref 17–63)
ANION GAP: 11 (ref 5–15)
AST: 27 U/L (ref 15–41)
Albumin: 2.6 g/dL — ABNORMAL LOW (ref 3.5–5.0)
BUN: 12 mg/dL (ref 6–20)
CALCIUM: 8 mg/dL — AB (ref 8.9–10.3)
CHLORIDE: 109 mmol/L (ref 101–111)
CO2: 19 mmol/L — AB (ref 22–32)
Creatinine, Ser: 1.14 mg/dL (ref 0.61–1.24)
GFR, EST NON AFRICAN AMERICAN: 58 mL/min — AB (ref >60)
Glucose, Bld: 98 mg/dL (ref 65–99)
Potassium: 4 mmol/L (ref 3.5–5.1)
SODIUM: 139 mmol/L (ref 135–145)
Total Bilirubin: 1.4 mg/dL — ABNORMAL HIGH (ref 0.3–1.2)
Total Protein: 6.3 g/dL — ABNORMAL LOW (ref 6.5–8.1)

## 2017-01-29 LAB — PROTIME-INR
INR: 3.75
PROTHROMBIN TIME: 36.8 s — AB (ref 11.4–15.2)

## 2017-01-29 LAB — CULTURE, RESPIRATORY: CULTURE: NORMAL

## 2017-01-29 LAB — GLUCOSE, CAPILLARY
GLUCOSE-CAPILLARY: 80 mg/dL (ref 65–99)
GLUCOSE-CAPILLARY: 76 mg/dL (ref 65–99)
GLUCOSE-CAPILLARY: 88 mg/dL (ref 65–99)
GLUCOSE-CAPILLARY: 80 mg/dL (ref 65–99)
Glucose-Capillary: 85 mg/dL (ref 65–99)
Glucose-Capillary: 94 mg/dL (ref 65–99)

## 2017-01-29 LAB — PHENYTOIN LEVEL, TOTAL: PHENYTOIN LVL: 11.6 ug/mL (ref 10.0–20.0)

## 2017-01-29 MED ORDER — VANCOMYCIN HCL 10 G IV SOLR
1250.0000 mg | Freq: Once | INTRAVENOUS | Status: AC
  Administered 2017-01-29: 1250 mg via INTRAVENOUS
  Filled 2017-01-29: qty 1250

## 2017-01-29 MED ORDER — VANCOMYCIN HCL 500 MG IV SOLR
500.0000 mg | Freq: Two times a day (BID) | INTRAVENOUS | Status: DC
  Administered 2017-01-30 – 2017-02-01 (×5): 500 mg via INTRAVENOUS
  Filled 2017-01-29 (×9): qty 500

## 2017-01-29 MED ORDER — LORAZEPAM 2 MG/ML IJ SOLN
1.0000 mg | Freq: Four times a day (QID) | INTRAMUSCULAR | Status: DC
  Administered 2017-01-29 – 2017-01-30 (×4): 1 mg via INTRAVENOUS
  Filled 2017-01-29 (×4): qty 1

## 2017-01-29 NOTE — Evaluation (Signed)
Clinical/Bedside Swallow Evaluation Patient Details  Name: Zachary Peterson MRN: [id number] Date of Birth: [birth year]  Today's Date: 01/29/2017 Time: SLP Start Time (ACUTE ONLY): 1459 SLP Stop Time (ACUTE ONLY): 1513 SLP Time Calculation (min) (ACUTE ONLY): 14 min  Past Medical History:  Past Medical History:  Diagnosis Date  . Back pain   . COPD (chronic obstructive pulmonary disease) (HCC)   . Depression   . Fatigue    a. in setting of Afib, improved following DCCV.  [name] Hearing loss   . History of GI bleed 1976  . Hyperlipemia   . Hypertension   . Migraine   . Palpitations   . Persistent atrial fibrillation (HCC)    a. 02/2016 Echo: EF 66-65%, mild MR;  b. CHA2DS2VASc = 3-->coumadin;  c. 05/2016 TEE/DCCV: EF 45-50%, triv AI, mild to mod MR, no LA/LAA/RA/RAA thrombus, mod to sev TR-->successful DCCV.  . Seizure (HCC)   . Syncope    a. related to seizure disorder   Past Surgical History:  Past Surgical History:  Procedure Laterality Date  . APPENDECTOMY    . BACK SURGERY    . CARDIOVERSION N/A 06/06/2016   Procedure: CARDIOVERSION;  Surgeon: Yvonne Kendall, MD;  Location: [facility] ORS;  Service: Cardiovascular;  Laterality: N/A;  . CATARACT EXTRACTION    . hemmoridectomy    . HERNIA REPAIR    . TEE WITHOUT CARDIOVERSION N/A 06/06/2016   Procedure: TRANSESOPHAGEAL ECHOCARDIOGRAM (TEE);  Surgeon: Yvonne Kendall, MD;  Location: [facility] ORS;  Service: Cardiovascular;  Laterality: N/A;   HPI:  84 y.o. male with known seizure disorder admitted through ED in respiratory distress following seizure requiring intubation. ETT 1/19-21.    Assessment / Plan / Recommendation Clinical Impression  Pt not sufficiently alert to begin PO intake. He was briefly arousable in order to participate in limited assessment, but demonstrated oral holding of POs, needing cues to swallow.  There was immediate coughing after consumption of thin liquids, concerning for aspiration.  Oral suctioning provided and  remaining POs removed from oral cavity.  Wife/dtr present.  Recommend allowing ice chips when alert; otherwise, continue NPO.  SLP will return in the am to determine potential to PO readiness. Family agrees.  SLP Visit Diagnosis: Dysphagia, unspecified (R13.10)    Aspiration Risk  Moderate aspiration risk    Diet Recommendation   NPO except ice chips when alert       Other  Recommendations Oral Care Recommendations: Oral care QID;Oral care prior to ice chip/H20   Follow up Recommendations (tba)      Frequency and Duration min 2x/week  1 week       Prognosis Prognosis for Safe Diet Advancement: Good      Swallow Study   General Date of Onset: 01/26/17 HPI: 84 y.o. male with known seizure disorder admitted through ED in respiratory distress following seizure requiring intubation. ETT 1/19-21.  Type of Study: Bedside Swallow Evaluation Previous Swallow Assessment: no Temperature Spikes Noted: Yes Respiratory Status: Nasal cannula History of Recent Intubation: Yes Length of Intubations (days): 2 days Date extubated: 01/28/17 Behavior/Cognition: Lethargic/Drowsy Oral Cavity Assessment: Within Functional Limits Oral Care Completed by SLP: No Oral Cavity - Dentition: Edentulous Vision: Impaired for self-feeding Self-Feeding Abilities: Total assist Patient Positioning: Upright in bed Baseline Vocal Quality: Hoarse Volitional Cough: Cognitively unable to elicit Volitional Swallow: Unable to elicit    Oral/Motor/Sensory Function Overall Oral Motor/Sensory Function: Other (comment)(symmetric at rest)   Ice Chips Ice chips: Within functional limits Presentation: Spoon   Thin  Liquid Thin Liquid: Impaired Presentation: Cup;Straw Oral Phase Impairments: Poor awareness of bolus Oral Phase Functional Implications: Oral holding Pharyngeal  Phase Impairments: Cough - Immediate    Nectar Thick Nectar Thick Liquid: Not tested   Honey Thick Honey Thick Liquid: Not tested   Puree  Puree: Not tested   Solid   GO   Solid: Not tested        Zachary Peterson, Zachary Peterson 01/29/2017,3:15 PM

## 2017-01-29 NOTE — Progress Notes (Signed)
Neurology Progress Note   S:// Seen and examined. No clinical seizures reported. He is more lethargic and difficult to arouse.  [place] his airway without difficulty.   O:// Current vital signs: BP (!) 175/83 (BP Location: Left Arm)   Pulse (!) 117   Temp (!) 101.8 F (38.8 C) (Axillary)   Resp (!) 21   Ht _0  (1.702 m)   Wt 69 kg (152 lb 1.9 oz)   SpO2 98%   BMI 23.82 kg/m  Vital signs in last 24 hours: Temp:  [100 F (37.8 C)-102.1 F (38.9 C)] 101.8 F (38.8 C) (01/22 0800) Pulse Rate:  [114-130] 117 (01/22 0800) Resp:  [18-33] 21 (01/22 0800) BP: (135-183)/(62-84) 175/83 (01/22 0800) SpO2:  [90 %-100 %] 98 % (01/22 0839) Weight:  [69 kg (152 lb 1.9 oz)] 69 kg (152 lb 1.9 oz) (01/22 0500) GENERAL: drowsy, moans to nox stim, responds yes/no to questions mostly appropriately. HEENT: - Normocephalic and atraumatic, dry mm, no LN++, no Thyromegally LUNGS - scattered rales all over CV - S1S2 RRR, no m/r/g, equal pulses bilaterally. ABDOMEN - Soft, nontender, nondistended with normoactive BS Ext: warm, well perfused, intact peripheral pulses, no edema NEURO:  Mental Status: drowsy, can not tell me where he is or what date it is. Says yes/no and some other incomprehensible speech Does not follow commands Cranial Nerves: PERRL. EOMI, visual fields full to threat, no facial asymmetry Motor: Unable to follow commands. No focal weakness based on withdrawal that is symmetuc on both UE. Tone: is normal  Sensation- as above Coordination: can not assess Gait- deferred  Medications  Current Facility-Administered Medications:  .  0.9 %  sodium chloride infusion, 250 mL, Intravenous, PRN, Omar Person, NP, Stopped at 01/29/17 0800 .  0.9 % NaCl with KCl 20 mEq/ L  infusion, , Intravenous, Continuous, Renee Pain, MD, Last Rate: 75 mL/hr at 01/29/17 0900 .  acetaminophen (TYLENOL) solution 650 mg, 650 mg, Per Tube, Q6H PRN, Anders Simmonds, MD, 650 mg at  01/28/17 0239 .  acetaminophen (TYLENOL) suppository 650 mg, 650 mg, Rectal, Q4H PRN, Sampson Goon, MD, 650 mg at 01/28/17 1636 .  arformoterol (BROVANA) nebulizer solution 15 mcg, 15 mcg, Nebulization, BID, Omar Person, NP, 15 mcg at 01/29/17 0841 .  budesonide (PULMICORT) nebulizer solution 0.5 mg, 0.5 mg, Nebulization, BID, Hayden Pedro M, NP, 0.5 mg at 01/29/17 0842 .  chlorhexidine gluconate (MEDLINE KIT) (PERIDEX) 0.12 % solution 15 mL, 15 mL, Mouth Rinse, BID, Scatliffe, Kristen D, MD, 15 mL at 01/28/17 2000 .  fentaNYL (SUBLIMAZE) injection 50 mcg, 50 mcg, Intravenous, Q15 min PRN, Dewaine Oats, Katalina M, NP .  fentaNYL (SUBLIMAZE) injection 50 mcg, 50 mcg, Intravenous, Q2H PRN, Dewaine Oats, Katalina M, NP .  ipratropium-albuterol (DUONEB) 0.5-2.5 (3) MG/3ML nebulizer solution 3 mL, 3 mL, Nebulization, Q6H, Omar Person, NP, 3 mL at 01/29/17 0839 .  LORazepam (ATIVAN) injection 1 mg, 1 mg, Intravenous, PRN, Kerney Elbe, MD .  LORazepam (ATIVAN) injection 1 mg, 1 mg, Intravenous, Q6H, Aroor, Karena Addison R, MD .  pantoprazole (PROTONIX) injection 40 mg, 40 mg, Intravenous, Daily, Hayden Pedro M, NP, 40 mg at 01/28/17 0837 .  phenytoin (DILANTIN) injection 100 mg, 100 mg, Intravenous, TID, Kerney Elbe, MD, 100 mg at 01/28/17 2216 .  piperacillin-tazobactam (ZOSYN) IVPB 3.375 g, 3.375 g, Intravenous, Q8H, Renee Pain, MD, Last Rate: 12.5 mL/hr at 01/29/17 0635, 3.375 g at 01/29/17 0635 Labs CBC    Component Value Date/Time   WBC  10.7 (H) 01/28/2017 0741   RBC 4.26 01/28/2017 0741   HGB 13.9 01/28/2017 0741   HGB 13.9 05/30/2016 1415   HCT 40.5 01/28/2017 0741   HCT 41.1 05/30/2016 1415   PLT 190 01/28/2017 0741   PLT 312 05/30/2016 1415   MCV 95.1 01/28/2017 0741   MCV 89 05/30/2016 1415   MCV 95 11/23/2013 0356   MCH 32.6 01/28/2017 0741   MCHC 34.3 01/28/2017 0741   RDW 15.3 01/28/2017 0741   RDW 15.2 05/30/2016 1415   RDW 14.9 (H) 11/23/2013 0356    LYMPHSABS 0.8 01/28/2017 0741   LYMPHSABS 1.3 05/30/2016 1415   MONOABS 0.8 01/28/2017 0741   EOSABS 0.0 01/28/2017 0741   EOSABS 0.1 05/30/2016 1415   BASOSABS 0.0 01/28/2017 0741   BASOSABS 0.0 05/30/2016 1415    CMP     Component Value Date/Time   NA 139 01/29/2017 0450   NA 139 05/30/2016 1415   NA 141 11/23/2013 0356   K 4.0 01/29/2017 0450   K 3.2 (L) 11/23/2013 0356   CL 109 01/29/2017 0450   CL 101 11/23/2013 0356   CO2 19 (L) 01/29/2017 0450   CO2 32 11/23/2013 0356   GLUCOSE 98 01/29/2017 0450   GLUCOSE 94 11/23/2013 0356   BUN 12 01/29/2017 0450   BUN 14 05/30/2016 1415   BUN 17 11/23/2013 0356   CREATININE 1.14 01/29/2017 0450   CREATININE 1.16 11/23/2013 0356   CALCIUM 8.0 (L) 01/29/2017 0450   CALCIUM 8.4 (L) 11/23/2013 0356   PROT 6.3 (L) 01/29/2017 0450   PROT 7.8 05/30/2016 1415   ALBUMIN 2.6 (L) 01/29/2017 0450   ALBUMIN 4.2 05/30/2016 1415   AST 27 01/29/2017 0450   ALT 12 (L) 01/29/2017 0450   ALKPHOS 82 01/29/2017 0450   BILITOT 1.4 (H) 01/29/2017 0450   BILITOT 0.2 05/30/2016 1415   GFRNONAA 58 (L) 01/29/2017 0450   GFRNONAA >60 11/23/2013 0356   GFRAA >60 01/29/2017 0450   GFRAA >60 11/23/2013 0356   EEG - gen slowing. No sz.  Imaging I have reviewed images in epic and the results pertinent to this consultation are: CT-scan of the brain - no acute changes. MRI examination of the brain -P  Assessment:  85/M with PMH of COPD, Afib on coumadin, Sz disorder, HLD and HTN presented with multiple seizures. Loaded with Keppra and started on standing dose of Keppra. Acute hypercarbic resp failure resolved. Daughter reports increased sedation with Keppra in the past.   Impression: Breakthrough seizures in setting of toxic/metabolic/resp derangements Resp failure - extubated now.  Recommendations: -D/C Keppra as it has made him lethargic. -MR brain without contrast to r/u acute process. If unable to get MRI, consider repeat CTH -Change  ativan from 24m q8 to 139mq6h -C/w Dilantin -Sz precautions -Frequent neurochecks -Get a repeat CXR as his chest exam is abnormal. -Treat tox/met derangements per ICU team. We will follow with you.  -- AsAmie PortlandMD [facility] Pager: [phone] 7pm to 7am, please call on call as listed on AMION.

## 2017-01-29 NOTE — Progress Notes (Signed)
Pharmacy Antibiotic Note  Zachary Peterson is a 84 y.o. male admitted on 01/26/2017 with seizure.  Pharmacy has been consulted for vancomycin dosing for possible bacteremia. Tmax is 102.1 and WBC is WNL at 10.7. Already on zosyn for possible aspiration pneumonia.   Plan: Vancomycin 1250mg  IV x 1 then 500mg  IV Q12H F/u renal fxn, C&S, clinical status and trough at SS  Height: 5\' 7"  (170.2 cm) Weight: 152 lb 1.9 oz (69 kg) IBW/kg (Calculated) : 66.1  Temp (24hrs), Avg:100.9 F (38.3 C), Min:100 F (37.8 C), Max:102.1 F (38.9 C)  Recent Labs  Lab 01/26/17 1424 01/26/17 2002 01/26/17 2017 01/26/17 2344 01/27/17 0420 01/27/17 0836 01/27/17 1258 01/28/17 0741 01/29/17 0450  WBC 5.7 9.7  --   --  11.3*  --   --  10.7*  --   CREATININE 1.26* 1.07  --   --  1.23  --   --  1.28* 1.14  LATICACIDVEN  --   --  2.43* 2.28* 3.8* 5.2* 4.6*  --   --     Estimated Creatinine Clearance: 45.9 mL/min (by C-G formula based on SCr of 1.14 mg/dL).    Allergies  Allergen Reactions  . Aspirin Other (See Comments)    CAUSES ULCERS  . Codeine Nausea And Vomiting  . Oxycodone Nausea And Vomiting    Antimicrobials this admission: Vanc 1/22>> Zosyn 1/20>>  Dose adjustments this admission: N/A  Microbiology results: 1/20 TA > pending 1/20 MRSA PCR neg  1/21 Blood - 1/2 GPC  Thank you for allowing pharmacy to be a part of this patient's care.  Felicita Nuncio, Drake LeachRachel Lynn 01/29/2017 10:51 AM

## 2017-01-29 NOTE — Progress Notes (Signed)
Orthopedic Tech Progress Note Patient Details:  Zachary RavelSamuel D Peterson November 16, 1931 [id number]  Ortho Devices Type of Ortho Device: Abdominal binder Ortho Device/Splint Location: bedside Ortho Device/Splint Interventions: Zachary DoffingOrdered       Zachary Peterson Zachary Peterson 01/29/2017, 4:27 PM

## 2017-01-29 NOTE — Progress Notes (Signed)
PHARMACY - PHYSICIAN COMMUNICATION CRITICAL VALUE ALERT - BLOOD CULTURE IDENTIFICATION (BCID)  Zachary Peterson is an 84 y.o. male who [facility]  Assessment:  84 yo M who had multiple seizures. Fevers could very likely be due to recent seizures, but reasonable to rule out other sources. Was worried for aspiration so was on Zosyn. WBC 10.7. Now 1/2 blood cx with staph species. Probably contaminant.   Name of physician (or Provider) Contacted: Zachary Peterson  Current antibiotics: Zosyn and vancomycin added today  Changes to prescribed antibiotics recommended:  Could consider stopping vancomycin, after discussion will keep coverage and may be able to d/c tomorrow Repeat CXR F/U if blood cx's remain 1/2, may be able to monitor off abx  No results found for this or any previous visit.  Zachary Peterson,Zachary Peterson 01/29/2017  10:53 AM

## 2017-01-29 NOTE — Progress Notes (Signed)
PULMONARY / CRITICAL CARE MEDICINE   Name: Zachary Peterson MRN: [id number] DOB: [birth year]    ADMISSION DATE:  01/26/2017   CHIEF COMPLAINT: Respiratory distress following a seizure.  HISTORY OF PRESENT ILLNESS:    This is an 84 year old with a known seizure disorder which usually manifest as one generalized seizure per year.  He was brought to the emergency room in respiratory distress following a seizure and required intubation and mechanical ventilation.  Family is at the bedside and tell me that he did not have any concurrent acute illness which may have precipitated his seizure.  He was complaining of some abdominal cramping but was otherwise not ill.  I have held his sedation during my exam this morning and he is following directions  for me.  He is in no distress on CPAP 5 with a pressure support of 5.  SUBJECTIVE:   Extubated 1/21.  Tolerated well.  This AM is somnolent but arouses to voice.  Does not follow commands yet.  VITAL SIGNS: BP (!) 171/77   Pulse (!) 129   Temp (!) 101.8 F (38.8 C) (Axillary)   Resp (!) 29   Ht 5\' 7"  (1.702 m)   Wt 69 kg (152 lb 1.9 oz)   SpO2 94%   BMI 23.82 kg/m   HEMODYNAMICS:    VENTILATOR SETTINGS:    INTAKE / OUTPUT: I/O last 3 completed shifts: In: 3136.4 [I.V.:2776.4; IV Piggyback:360] Out: 3610 [Urine:3610]  PHYSICAL EXAMINATION: General: Adult male, in NAD. Neuro: Somnolent but arouses to voice.  Does not follow commands. Cardiovascular: IRIR, no M/R/G. Lungs: Respirations unlabored.  CTAB. Abdomen:  BS x 4, S/NT/ND.  LABS:  BMET Recent Labs  Lab 01/27/17 0420 01/28/17 0741 01/29/17 0450  NA 137 138 139  K 3.8 3.8 4.0  CL 104 109 109  CO2 19* 19* 19*  BUN 12 16 12   CREATININE 1.23 1.28* 1.14  GLUCOSE 134* 102* 98    Electrolytes Recent Labs  Lab 01/27/17 0420 01/28/17 0741 01/29/17 0450  CALCIUM 8.1* 7.7* 8.0*  MG 1.8 1.7  --   PHOS 3.4 1.8*  --     CBC Recent Labs  Lab 01/26/17 2002  01/27/17 0420 01/28/17 0741  WBC 9.7 11.3* 10.7*  HGB 15.3 14.0 13.9  HCT 44.5 42.1 40.5  PLT 242 279 190    Coag's Recent Labs  Lab 01/27/17 2143 01/28/17 0741 01/29/17 0450  INR 2.53 2.99 3.75    Sepsis Markers Recent Labs  Lab 01/27/17 0420 01/27/17 0836 01/27/17 0900 01/27/17 1258  LATICACIDVEN 3.8* 5.2*  --  4.6*  PROCALCITON 1.98  --  1.93  --     ABG Recent Labs  Lab 01/27/17 0052 01/28/17 1715  PHART 7.219* 7.331*  PCO2ART 59.9* 37.0  PO2ART 78.0* 88.0    Liver Enzymes Recent Labs  Lab 01/26/17 1424 01/26/17 2002 01/29/17 0450  AST 25 25 27   ALT 11* 13* 12*  ALKPHOS 120 150* 82  BILITOT 0.7 0.6 1.4*  ALBUMIN 3.6 4.3 2.6*    Cardiac Enzymes Recent Labs  Lab 01/26/17 1432 01/26/17 2002  TROPONINI <0.03 <0.03    Glucose Recent Labs  Lab 01/28/17 1157 01/28/17 1539 01/28/17 2002 01/28/17 2328 01/29/17 0331 01/29/17 0806  GLUCAP 78 82 80 86 80 76    Imaging Dg Chest Port 1 View  Result Date: 01/29/2017 CLINICAL DATA:  Seizure with possible aspiration. EXAM: PORTABLE CHEST 1 VIEW COMPARISON:  01/28/2017 FINDINGS: Lower lung volumes with bibasilar patchy opacity likely  atelectasis. No dense focal airspace consolidation. No overt pulmonary edema or pleural effusion. Cardiopericardial silhouette is at upper limits of normal for size. The visualized bony structures of the thorax are intact. Telemetry leads overlie the chest. IMPRESSION: Lower lung volumes with increase in basilar atelectasis. Electronically Signed   By: Kennith Center M.D.   On: 01/29/2017 10:19   STUDIES: CT head 1/19 > no acute process. EEG 1/20 > no epileptiform discharges or EEG seizures noted. MRI brain 1/22 >   MICRO: Blood 1/21 > GPC's >   DISCUSSION: 84 year old with a history of seizure disorder which manifests itself is approximately 1 tonic-clonic seizure per year.  He suffered from a seizure yesterday and was in respiratory distress subsequent to the  seizure and was intubated and mechanically ventilated.  He has had no further seizure activity and is interactive this morning.  I am anticipating extubation later today.        ASSESSMENT / PLAN:  Respiratory insufficiency - s/p extubation 1/21. Possible PNA - bibasilar infiltrate. Hx COPD. P: Bronchial hygiene. Continue empiric zosyn and add vanc given GPC's in blood cultures. Continue BD's.  Hx A.fib (on coumadin), HTN, HLD. P: Heparin per pharmacy once INR < 2. Will need to resume preadmission coumadin at some point. Monitor hemodynamics.  Seizure disorder - was on keppra but had stopped taking on own. Hx syncope (felt to be due to seizures), migraines, depression, back pain. P: AED's per neuro (keppra d/c'd 1/22.  Continuing phenytoin, ativan). F/u on brain MRI (ordered 1/22).  ? Bacteremia - Blood cultures from 1/21 preliminarily growing GPC's. P: Add vanc. Follow cultures through completion.   Rutherford Guys, [place] [facility] [place] Pager: [phone]  or [phone] 01/29/2017, 10:47 AM

## 2017-01-30 ENCOUNTER — Inpatient Hospital Stay (HOSPITAL_COMMUNITY): Payer: Medicare HMO

## 2017-01-30 LAB — CULTURE, RESPIRATORY W GRAM STAIN

## 2017-01-30 LAB — BASIC METABOLIC PANEL
Anion gap: 16 — ABNORMAL HIGH (ref 5–15)
BUN: 13 mg/dL (ref 6–20)
CO2: 14 mmol/L — AB (ref 22–32)
Calcium: 8.1 mg/dL — ABNORMAL LOW (ref 8.9–10.3)
Chloride: 107 mmol/L (ref 101–111)
Creatinine, Ser: 1.15 mg/dL (ref 0.61–1.24)
GFR calc non Af Amer: 57 mL/min — ABNORMAL LOW (ref >60)
Glucose, Bld: 91 mg/dL (ref 65–99)
Potassium: 4.4 mmol/L (ref 3.5–5.1)
Sodium: 137 mmol/L (ref 135–145)

## 2017-01-30 LAB — HEPATIC FUNCTION PANEL
ALT: 11 U/L — AB (ref 17–63)
AST: 21 U/L (ref 15–41)
Albumin: 2.4 g/dL — ABNORMAL LOW (ref 3.5–5.0)
Alkaline Phosphatase: 77 U/L (ref 38–126)
BILIRUBIN TOTAL: 1.5 mg/dL — AB (ref 0.3–1.2)
Bilirubin, Direct: 0.6 mg/dL — ABNORMAL HIGH (ref 0.1–0.5)
Indirect Bilirubin: 0.9 mg/dL (ref 0.3–0.9)
Total Protein: 6.5 g/dL (ref 6.5–8.1)

## 2017-01-30 LAB — GLUCOSE, CAPILLARY
Glucose-Capillary: 86 mg/dL (ref 65–99)
Glucose-Capillary: 82 mg/dL (ref 65–99)
Glucose-Capillary: 109 mg/dL — ABNORMAL HIGH (ref 65–99)
Glucose-Capillary: 110 mg/dL — ABNORMAL HIGH (ref 65–99)
Glucose-Capillary: 138 mg/dL — ABNORMAL HIGH (ref 65–99)

## 2017-01-30 LAB — MAGNESIUM
MAGNESIUM: 1.7 mg/dL (ref 1.7–2.4)
MAGNESIUM: 1.8 mg/dL (ref 1.7–2.4)
Magnesium: 1.9 mg/dL (ref 1.7–2.4)

## 2017-01-30 LAB — LACTIC ACID, PLASMA
Lactic Acid, Venous: 1.3 mmol/L (ref 0.5–1.9)
Lactic Acid, Venous: 1.3 mmol/L (ref 0.5–1.9)

## 2017-01-30 LAB — CBC
HEMATOCRIT: 37 % — AB (ref 39.0–52.0)
Hemoglobin: 12.5 g/dL — ABNORMAL LOW (ref 13.0–17.0)
MCH: 32.1 pg (ref 26.0–34.0)
MCHC: 33.8 g/dL (ref 30.0–36.0)
MCV: 94.9 fL (ref 78.0–100.0)
Platelets: 236 10*3/uL (ref 150–400)
RBC: 3.9 MIL/uL — ABNORMAL LOW (ref 4.22–5.81)
RDW: 15.1 % (ref 11.5–15.5)
WBC: 10.8 10*3/uL — ABNORMAL HIGH (ref 4.0–10.5)

## 2017-01-30 LAB — CULTURE, RESPIRATORY
GRAM STAIN: NONE SEEN
SPECIAL REQUESTS: NORMAL

## 2017-01-30 LAB — PROTIME-INR
INR: 4.68 — AB
INR: 1.76
PROTHROMBIN TIME: 43.7 s — AB (ref 11.4–15.2)
PROTHROMBIN TIME: 20.4 s — AB (ref 11.4–15.2)

## 2017-01-30 LAB — PHOSPHORUS
Phosphorus: 1.7 mg/dL — ABNORMAL LOW (ref 2.5–4.6)
Phosphorus: 2.1 mg/dL — ABNORMAL LOW (ref 2.5–4.6)
Phosphorus: 1.5 mg/dL — ABNORMAL LOW (ref 2.5–4.6)

## 2017-01-30 LAB — PROCALCITONIN: PROCALCITONIN: 2.39 ng/mL

## 2017-01-30 LAB — PHENYTOIN LEVEL, TOTAL: Phenytoin Lvl: 12.4 ug/mL (ref 10.0–20.0)

## 2017-01-30 MED ORDER — SODIUM BICARBONATE 8.4 % IV SOLN
INTRAVENOUS | Status: DC
  Administered 2017-01-30: 15:00:00 via INTRAVENOUS
  Filled 2017-01-30 (×3): qty 150

## 2017-01-30 MED ORDER — LACTATED RINGERS IV SOLN
INTRAVENOUS | Status: DC
  Administered 2017-01-30 (×2): via INTRAVENOUS

## 2017-01-30 MED ORDER — PRO-STAT SUGAR FREE PO LIQD: 30.0000 mL | Freq: Two times a day (BID) | ORAL | Status: DC

## 2017-01-30 MED ORDER — VITAMIN K1 10 MG/ML IJ SOLN
10.0000 mg | Freq: Once | INTRAMUSCULAR | Status: AC
  Administered 2017-01-30: 10 mg via SUBCUTANEOUS
  Filled 2017-01-30: qty 1

## 2017-01-30 MED ORDER — LABETALOL HCL 5 MG/ML IV SOLN
20.0000 mg | INTRAVENOUS | Status: DC | PRN
  Administered 2017-01-30 – 2017-02-04 (×12): 20 mg via INTRAVENOUS
  Filled 2017-01-30 (×12): qty 4

## 2017-01-30 MED ORDER — K PHOS MONO-SOD PHOS DI & MONO 155-852-130 MG PO TABS
250.0000 mg | ORAL_TABLET | Freq: Two times a day (BID) | ORAL | Status: AC
  Filled 2017-01-30 (×4): qty 1

## 2017-01-30 MED ORDER — HEPARIN (PORCINE) IN NACL 100-0.45 UNIT/ML-% IJ SOLN
1350.0000 [IU]/h | INTRAMUSCULAR | Status: DC
  Administered 2017-01-30 – 2017-01-31 (×2): 900 [IU]/h via INTRAVENOUS
  Administered 2017-02-01: 1350 [IU]/h via INTRAVENOUS
  Filled 2017-01-30 (×3): qty 250

## 2017-01-30 MED ORDER — VITAL HIGH PROTEIN PO LIQD
1000.0000 mL | ORAL | Status: DC
  Filled 2017-01-30 (×2): qty 1000

## 2017-01-30 MED ORDER — LORAZEPAM 2 MG/ML IJ SOLN
1.0000 mg | Freq: Three times a day (TID) | INTRAMUSCULAR | Status: DC
  Administered 2017-01-30 (×2): 1 mg via INTRAVENOUS
  Filled 2017-01-30 (×2): qty 1

## 2017-01-30 NOTE — Progress Notes (Signed)
Paged on call CCM MD in regards to pt being very hypertensive. BP 191/92. New order given for labetalol PRN q4 hours for SBP >170. Will give new medication and continue to monitor. Delfino Lovettichie Salik Grewell, RN, BSN 01/30/2017 2:14 AM

## 2017-01-30 NOTE — Progress Notes (Signed)
Neurology Progress Note   S:// Seen and examined. Remains extubated. Some increased work of breathing noted but remained fairly stable.   O:// Current vital signs: BP (!) 179/90   Pulse (!) 105   Temp 98.9 F (37.2 C) (Oral)   Resp (!) 25   Ht 5\' 7"  (1.702 m)   Wt 67.5 kg (148 lb 13 oz)   SpO2 96%   BMI 23.31 kg/m  Vital signs in last 24 hours: Temp:  [98.9 F (37.2 C)-101.3 F (38.5 C)] 98.9 F (37.2 C) (01/23 0800) Pulse Rate:  [103-137] 105 (01/23 0800) Resp:  [18-32] 25 (01/23 0800) BP: (128-204)/(69-110) 179/90 (01/23 0800) SpO2:  [91 %-99 %] 96 % (01/23 0800) Weight:  [67.5 kg (148 lb 13 oz)] 67.5 kg (148 lb 13 oz) (01/23 0400)  GENERAL: drowsy, moans to nox stim, responds yes/no to questions mostly appropriately.  Was able to tell me his name today. HEENT: - Normocephalic and atraumatic, dry mm, no LN++, no Thyromegally LUNGS - scattered rales all over CV - S1S2 RRR, no m/r/g, equal pulses bilaterally. ABDOMEN - Soft, nontender, nondistended with normoactive BS Ext: warm, well perfused, intact peripheral pulses, no edema NEURO:  Mental Status: drowsy, can not tell me where he is or what date it is. Says yes/no and was able to tell me his name. Follows commands. cranial Nerves: PERRL. EOMI, visual fields full to threat, no facial asymmetry Motor:  Able to raise both upper extremities antigravity for a few seconds.  Wiggles toes on command. Tone: is normal  Sensation- said yes to feeling light touch on both legs. Coordination: can not assess Gait- deferred  Medications  Current Facility-Administered Medications:  .  0.9 %  sodium chloride infusion, 250 mL, Intravenous, PRN, Tobey Grim, NP, Stopped at 01/29/17 0800 .  0.9 % NaCl with KCl 20 mEq/ L  infusion, , Intravenous, Continuous, Marcelle Smiling, MD, Last Rate: 75 mL/hr at 01/29/17 1800 .  acetaminophen (TYLENOL) solution 650 mg, 650 mg, Per Tube, Q6H PRN, Karl Ito, MD, 650 mg at 01/28/17  0239 .  acetaminophen (TYLENOL) suppository 650 mg, 650 mg, Rectal, Q4H PRN, Lynnell Jude, MD, 650 mg at 01/30/17 0226 .  arformoterol (BROVANA) nebulizer solution 15 mcg, 15 mcg, Nebulization, BID, Eubanks, Katalina M, NP, 15 mcg at 01/29/17 2017 .  budesonide (PULMICORT) nebulizer solution 0.5 mg, 0.5 mg, Nebulization, BID, Jovita Kussmaul M, NP, 0.5 mg at 01/29/17 2017 .  ipratropium-albuterol (DUONEB) 0.5-2.5 (3) MG/3ML nebulizer solution 3 mL, 3 mL, Nebulization, Q6H, Eubanks, Katalina M, NP, 3 mL at 01/30/17 0202 .  labetalol (NORMODYNE,TRANDATE) injection 20 mg, 20 mg, Intravenous, Q4H PRN, Henry Russel, MD, 20 mg at 01/30/17 1610 .  LORazepam (ATIVAN) injection 1 mg, 1 mg, Intravenous, PRN, Caryl Pina, MD .  LORazepam (ATIVAN) injection 1 mg, 1 mg, Intravenous, Q6H, Aroor, Dara Lords, MD, 1 mg at 01/30/17 0608 .  pantoprazole (PROTONIX) injection 40 mg, 40 mg, Intravenous, Daily, Jovita Kussmaul M, NP, 40 mg at 01/29/17 0948 .  phenytoin (DILANTIN) injection 100 mg, 100 mg, Intravenous, TID, Caryl Pina, MD, 100 mg at 01/29/17 2202 .  piperacillin-tazobactam (ZOSYN) IVPB 3.375 g, 3.375 g, Intravenous, Q8H, Marcelle Smiling, MD, Last Rate: 12.5 mL/hr at 01/30/17 0611, 3.375 g at 01/30/17 0611 .  vancomycin (VANCOCIN) 500 mg in sodium chloride 0.9 % 100 mL IVPB, 500 mg, Intravenous, Q12H, Rumbarger, Faye Ramsay, RPH, Stopped at 01/30/17 0111 Labs CBC    Component Value Date/Time   WBC  10.8 (H) 01/30/2017 0255   RBC 3.90 (L) 01/30/2017 0255   HGB 12.5 (L) 01/30/2017 0255   HGB 13.9 05/30/2016 1415   HCT 37.0 (L) 01/30/2017 0255   HCT 41.1 05/30/2016 1415   PLT 236 01/30/2017 0255   PLT 312 05/30/2016 1415   MCV 94.9 01/30/2017 0255   MCV 89 05/30/2016 1415   MCV 95 11/23/2013 0356   MCH 32.1 01/30/2017 0255   MCHC 33.8 01/30/2017 0255   RDW 15.1 01/30/2017 0255   RDW 15.2 05/30/2016 1415   RDW 14.9 (H) 11/23/2013 0356   LYMPHSABS 0.8 01/28/2017 0741   LYMPHSABS 1.3  05/30/2016 1415   MONOABS 0.8 01/28/2017 0741   EOSABS 0.0 01/28/2017 0741   EOSABS 0.1 05/30/2016 1415   BASOSABS 0.0 01/28/2017 0741   BASOSABS 0.0 05/30/2016 1415    CMP     Component Value Date/Time   NA 137 01/30/2017 0255   NA 139 05/30/2016 1415   NA 141 11/23/2013 0356   K 4.4 01/30/2017 0255   K 3.2 (L) 11/23/2013 0356   CL 107 01/30/2017 0255   CL 101 11/23/2013 0356   CO2 14 (L) 01/30/2017 0255   CO2 32 11/23/2013 0356   GLUCOSE 91 01/30/2017 0255   GLUCOSE 94 11/23/2013 0356   BUN 13 01/30/2017 0255   BUN 14 05/30/2016 1415   BUN 17 11/23/2013 0356   CREATININE 1.15 01/30/2017 0255   CREATININE 1.16 11/23/2013 0356   CALCIUM 8.1 (L) 01/30/2017 0255   CALCIUM 8.4 (L) 11/23/2013 0356   PROT 6.3 (L) 01/29/2017 0450   PROT 7.8 05/30/2016 1415   ALBUMIN 2.6 (L) 01/29/2017 0450   ALBUMIN 4.2 05/30/2016 1415   AST 27 01/29/2017 0450   ALT 12 (L) 01/29/2017 0450   ALKPHOS 82 01/29/2017 0450   BILITOT 1.4 (H) 01/29/2017 0450   BILITOT 0.2 05/30/2016 1415   GFRNONAA 57 (L) 01/30/2017 0255   GFRNONAA >60 11/23/2013 0356   GFRAA >60 01/30/2017 0255   GFRAA >60 11/23/2013 0356  Last EEG-generalized slowing with no seizures  Imaging I have reviewed images in epic and the results pertinent to this consultation are: MRI brain-no acute changes.  No strokes. CT scan of the brain-no acute changes.  Assessment:  84 year old man with COPD, A. fib on Coumadin, seizure disorder, hyperlipidemia and hypertension presented with multiple seizures.  He was loaded with Keppra and started on standing dose of Keppra.  He also had hypercarbic respiratory failure that seems to have resolved but now he seems to have a possible aspiration pneumonia. He continued to be very sedated.  The daughter reported that he has had similar sedation with Keppra in the past. We discontinued Keppra yesterday.  His mental status seems to be slowly improving.  Impression: Breakthrough seizure in the  setting of toxic metabolic derangements Possible aspiration pneumonia No acute stroke on MRI  Recommendations: -Avoid Keppra -Continue Dilantin -Seizure precautions -Ativan-decrease dose to 1mg  every 8 hours to see if that improves his mentation. -Treatment of the aspiration pneumonia per primary team as you are -Treatment of toxic metabolic derangements per primary team as you are.  Neurology will be available as needed.  Please call with questions.  -- Milon DikesAshish Zakariya Knickerbocker, MD [facility] Pager: [phone] If 7pm to 7am, please call on call as listed on AMION.

## 2017-01-30 NOTE — Progress Notes (Signed)
ANTICOAGULATION CONSULT NOTE - Initial Consult  Pharmacy Consult for Heparin when INR <2 (warfarin on hold) Indication: atrial fibrillation  Allergies  Allergen Reactions  . Aspirin Other (See Comments)    CAUSES ULCERS  . Codeine Nausea And Vomiting  . Oxycodone Nausea And Vomiting   Patient Measurements: Height: 5\' 7"  (170.2 cm) Weight: 148 lb 13 oz (67.5 kg) IBW/kg (Calculated) : 66.1  Vital Signs: Temp: 100.5 F (38.1 C) (01/23 2000) Temp Source: Oral (01/23 2000) BP: 151/69 (01/23 1930) Pulse Rate: 103 (01/23 1930)  Labs: Recent Labs    01/28/17 0741 01/29/17 0450 01/30/17 0255 01/30/17 1905  HGB 13.9  --  12.5*  --   HCT 40.5  --  37.0*  --   PLT 190  --  236  --   LABPROT 30.9* 36.8* 43.7* 20.4*  INR 2.99 3.75 4.68* 1.76  CREATININE 1.28* 1.14 1.15  --    Estimated Creatinine Clearance: 45.5 mL/min (by C-G formula based on SCr of 1.15 mg/dL).  Medical History: Past Medical History:  Diagnosis Date  . Back pain   . COPD (chronic obstructive pulmonary disease) (HCC)   . Depression   . Fatigue    a. in setting of Afib, improved following DCCV.  [name]. Hearing loss   . History of GI bleed 1976  . Hyperlipemia   . Hypertension   . Migraine   . Palpitations   . Persistent atrial fibrillation (HCC)    a. 02/2016 Echo: EF 66-65%, mild MR;  b. CHA2DS2VASc = 3-->coumadin;  c. 05/2016 TEE/DCCV: EF 45-50%, triv AI, mild to mod MR, no LA/LAA/RA/RAA thrombus, mod to sev TR-->successful DCCV.  . Seizure (HCC)   . Syncope    a. related to seizure disorder   Assessment: 84 y/o M on warfarin PTA for afib, presents to the ED with seizures, [facility] on 1/19 for same issue, required intubation 1/20-1/21, Pharmacy was consulted at admission to holding warfarin and starting heparin when INR is <2, INR on admit was 2.02, had been trending up every day though no coumadin was given. INR was 4.68 and pt received 10mg  sq vitamin K. INR is 1.76 now.   Goal of Therapy:   Heparin level 0.3-0.7 units/ml Monitor platelets by anticoagulation protocol: Yes   Plan:  Heparin 900 units/hr without bolus F/u AM heparin level, CBC and PT/INR F/u plan for restarting coumadin  Bayard HuggerMei Mykala Mccready, PharmD, BCPS  Clinical Pharmacist  Pager: [phone]   01/30/2017,8:52 PM

## 2017-01-30 NOTE — Progress Notes (Signed)
PULMONARY / CRITICAL CARE MEDICINE   Name: Zachary Peterson MRN: [id number] DOB: 07-06-1931    ADMISSION DATE:  01/26/2017   CHIEF COMPLAINT: Respiratory distress following a seizure.  HISTORY OF PRESENT ILLNESS:    This is an 84 year old with a known seizure disorder which usually manifest as one generalized seizure per year.  He was brought to the emergency room in respiratory distress following a seizure and required intubation and mechanical ventilation.  Family is at the bedside and tell me that he did not have any concurrent acute illness which may have precipitated his seizure.  He was complaining of some abdominal cramping but was otherwise not ill.  I have held his sedation during my exam this morning and he is following directions  for me.  He is in no distress on CPAP 5 with a pressure support of 5.  SUBJECTIVE:  Tolerating extubation. Still unable to pass swallow eval due to lethargy. Neurology decreased ativan dosing to hopefully help with this. Remained hypertensive overnight.  VITAL SIGNS: BP (!) 179/90   Pulse (!) 103   Temp 98.9 F (37.2 C) (Oral)   Resp (!) 25   Ht 5\' 7"  (1.702 m)   Wt 67.5 kg (148 lb 13 oz)   SpO2 98%   BMI 23.31 kg/m   HEMODYNAMICS:    VENTILATOR SETTINGS:    INTAKE / OUTPUT: I/O last 3 completed shifts: In: 3006 [I.V.:2651; IV Piggyback:355] Out: 4085 [Urine:4085]  PHYSICAL EXAMINATION: General: Adult male, in NAD. Neuro: Somnolent, but was able to wake him up to voice after a few attempts. Minimally follows commands.  Cardiovascular: RRR, no MRG Lungs: Respirations unlabored.  CTAB. Abdomen:  BS x 4, S/NT/ND.  LABS:  BMET Recent Labs  Lab 01/28/17 0741 01/29/17 0450 01/30/17 0255  NA 138 139 137  K 3.8 4.0 4.4  CL 109 109 107  CO2 19* 19* 14*  BUN 16 12 13   CREATININE 1.28* 1.14 1.15  GLUCOSE 102* 98 91    Electrolytes Recent Labs  Lab 01/27/17 0420 01/28/17 0741 01/29/17 0450 01/30/17 0255  CALCIUM 8.1* 7.7*  8.0* 8.1*  MG 1.8 1.7  --  1.7  PHOS 3.4 1.8*  --  1.7*    CBC Recent Labs  Lab 01/27/17 0420 01/28/17 0741 01/30/17 0255  WBC 11.3* 10.7* 10.8*  HGB 14.0 13.9 12.5*  HCT 42.1 40.5 37.0*  PLT 279 190 236    Coag's Recent Labs  Lab 01/28/17 0741 01/29/17 0450 01/30/17 0255  INR 2.99 3.75 4.68*    Sepsis Markers Recent Labs  Lab 01/27/17 0420 01/27/17 0836 01/27/17 0900 01/27/17 1258  LATICACIDVEN 3.8* 5.2*  --  4.6*  PROCALCITON 1.98  --  1.93  --     ABG Recent Labs  Lab 01/27/17 0052 01/28/17 1715  PHART 7.219* 7.331*  PCO2ART 59.9* 37.0  PO2ART 78.0* 88.0    Liver Enzymes Recent Labs  Lab 01/26/17 1424 01/26/17 2002 01/29/17 0450  AST 25 25 27   ALT 11* 13* 12*  ALKPHOS 120 150* 82  BILITOT 0.7 0.6 1.4*  ALBUMIN 3.6 4.3 2.6*    Cardiac Enzymes Recent Labs  Lab 01/26/17 1432 01/26/17 2002  TROPONINI <0.03 <0.03    Glucose Recent Labs  Lab 01/29/17 1200 01/29/17 1543 01/29/17 2005 01/29/17 2351 01/30/17 0328 01/30/17 0746  GLUCAP 88 80 94 85 86 82    Imaging Mr Brain Wo Contrast  Result Date: 01/30/2017 CLINICAL DATA:  Initial evaluation for acute seizure, history of epilepsy.  EXAM: MRI HEAD WITHOUT CONTRAST TECHNIQUE: Multiplanar, multiecho pulse sequences of the brain and surrounding structures were obtained without intravenous contrast. COMPARISON:  Prior CT from 01/26/2017. FINDINGS: Brain: Study moderately degraded by motion artifact. Diffuse prominence of the CSF containing spaces compatible with generalized age-related cerebral atrophy. Patchy and confluent T2/FLAIR hyperintensity within the periventricular and deep white matter both cerebral hemispheres most consistent with chronic small vessel ischemic disease. No abnormal foci of restricted diffusion to suggest acute or subacute ischemia or changes related to seizure. No encephalomalacia to suggest chronic infarction. Gray-white matter differentiation maintained. No evidence  for acute or chronic intracranial hemorrhage. No mass lesion, midline shift or mass effect. Diffuse ventricular prominence related global parenchymal volume loss without hydrocephalus. No extra-axial fluid collection. Major dural sinuses are grossly patent. Pituitary gland suprasellar region grossly normal. Midline structures intact. Vascular: Major intracranial vascular flow voids are maintained. Skull and upper cervical spine: Craniocervical junction grossly normal. Bone marrow signal intensity within normal limits. No scalp soft tissue abnormality. Sinuses/Orbits: Globes and orbital soft tissues within normal limits. Patient status post cataract extraction bilaterally. Paranasal sinuses are clear. No mastoid effusion. Other: None. IMPRESSION: 1. No acute intracranial abnormality. 2. Moderately advanced cerebral atrophy with chronic small vessel ischemic disease. Electronically Signed   By: Rise MuBenjamin  McClintock M.D.   On: 01/30/2017 04:58   Dg Chest Port 1 View  Result Date: 01/29/2017 CLINICAL DATA:  Seizure with possible aspiration. EXAM: PORTABLE CHEST 1 VIEW COMPARISON:  01/28/2017 FINDINGS: Lower lung volumes with bibasilar patchy opacity likely atelectasis. No dense focal airspace consolidation. No overt pulmonary edema or pleural effusion. Cardiopericardial silhouette is at upper limits of normal for size. The visualized bony structures of the thorax are intact. Telemetry leads overlie the chest. IMPRESSION: Lower lung volumes with increase in basilar atelectasis. Electronically Signed   By: Kennith CenterEric  Mansell M.D.   On: 01/29/2017 10:19   STUDIES: CT head 1/19 > no acute process. EEG 1/20 > no epileptiform discharges or EEG seizures noted. MRI brain 1/22 > No acute intracranial abnormality. Moderately advanced cerebral atrophy with chronic small vessel ischemic disease.  MICRO: Blood 1/21 > GPC's >   DISCUSSION: 84 year old with a history of seizure disorder which manifests itself is  approximately 1 tonic-clonic seizure per year.  He suffered from a seizure yesterday and was in respiratory distress subsequent to the seizure and was intubated and mechanically ventilated.  Seizures improved with AEDs under the direction of neurology and he was able to be extubated 1/21. Course now complicated by lethargy. Also blood cultures growing GPC, may be contaminant, following. Metabolic acidosis developed 1/23, cause unclear.   ASSESSMENT / PLAN:  Respiratory insufficiency - s/p extubation 1/21. Possible PNA - bibasilar infiltrate. Hx COPD. P: Supplemental O2 as indicated Continue BD's. Incentive spirometry Zosyn for aspiration PNA.   Hx A.fib (on coumadin), HTN, HLD. P: Continue to hold anticoagulation as INR rising Repeat INR, Assess LFT, Fibrinogen Will need to resume preadmission coumadin at some point. Monitor hemodynamics.  Seizure disorder - was on keppra but had stopped taking on own. Lethargy likely due to acidosis this AM 1/23 Hx syncope (felt to be due to seizures), migraines, depression, back pain.  P: AED's per neuro (keppra d/c'd 1/22.  Continuing phenytoin, ativan). Neurology decreasing scheduled ativan frequency with hopes of improving lethargy. They have signed off. He does take scheduled valium at home per daughter.  Metabolic acidosis Check lactic, ketones  Dysphagia SLP following - NPO rec Place cortrak to start TF  ?  Bacteremia - Blood cultures from 1/21 preliminarily growing GPC's. P: Continue vanc. Follow cultures through completion. Echo if cultures remain positive.   Joneen Roach, AGACNP-BC [facility] Pulmonology/Critical Care Pager [phone] or [phone]  01/30/2017 10:37 AM

## 2017-01-30 NOTE — Progress Notes (Signed)
  Speech Language Pathology Treatment: Dysphagia  Patient Details Name: Derrick RavelSamuel D Wedekind MRN: [id number] DOB: [birth year] Today's Date: 01/30/2017 Time: 0920-0930 SLP Time Calculation (min) (ACUTE ONLY): 10 min  Assessment / Plan / Recommendation Clinical Impression  Pt remains lethargic.  Daughter, [place]any, present. With max stimulation, aroused briefly, accepted ice chips, but not able to sustain attention to bolus; coughs immediately post-swallow.  Discussed with daughter, RN - if pt's MS improves later today, please page SLP at [phone].  Otherwise, SLP will return next date for ongoing assessment of PO readiness.    Continue NPO - allow occasional ice chips when pt is alert.    HPI HPI: 84 y.o. male with known seizure disorder admitted through ED in respiratory distress following seizure requiring intubation. ETT 1/19-21.       SLP Plan  Continue with current plan of care       Recommendations  Diet recommendations: NPO                Oral Care Recommendations: Oral care QID;Oral care prior to ice chip/H20 Follow up Recommendations: (tba) SLP Visit Diagnosis: Dysphagia, unspecified (R13.10) Plan: Continue with current plan of care       GO                Carolan ShiverCouture, Magaline Steinberg Laurice 01/30/2017, 9:58 AM Marchelle FolksAmanda L. Samson Fredericouture, KentuckyMA CCC/SLP Pager [phone]

## 2017-01-30 NOTE — Progress Notes (Signed)
CRITICAL VALUE ALERT  Critical Value:  INR 4.68  Date & Time Notied:  01/30/17 @ 0415  Provider Notified: E-link @ 0518  Orders Received/Actions taken: No signs of active bleeding. No new orders given. Will continue to monitor. Delfino Lovettichie Mandeep Kiser, RN, BSN 01/30/2017 5:19 AM

## 2017-01-30 NOTE — Progress Notes (Signed)
   01/30/17 1600  Clinical Encounter Type  Visited With Family  Visit Type Initial  Referral From Chaplain  Consult/Referral To Chaplain  Spiritual Encounters  Spiritual Needs Emotional  Chaplain visited with the PT family.  They were receptive to the visit but didn't have any specific need at this point.  The family thanked the Chaplain for spending time with them

## 2017-01-31 LAB — CBC
HCT: 37.5 % — ABNORMAL LOW (ref 39.0–52.0)
HEMOGLOBIN: 12.8 g/dL — AB (ref 13.0–17.0)
MCH: 31.5 pg (ref 26.0–34.0)
MCHC: 34.1 g/dL (ref 30.0–36.0)
MCV: 92.4 fL (ref 78.0–100.0)
Platelets: 256 10*3/uL (ref 150–400)
RBC: 4.06 MIL/uL — AB (ref 4.22–5.81)
RDW: 14.4 % (ref 11.5–15.5)
WBC: 11.1 10*3/uL — ABNORMAL HIGH (ref 4.0–10.5)

## 2017-01-31 LAB — CULTURE, BLOOD (ROUTINE X 2): SPECIAL REQUESTS: ADEQUATE

## 2017-01-31 LAB — GLUCOSE, CAPILLARY
GLUCOSE-CAPILLARY: 132 mg/dL — AB (ref 65–99)
Glucose-Capillary: 144 mg/dL — ABNORMAL HIGH (ref 65–99)
Glucose-Capillary: 151 mg/dL — ABNORMAL HIGH (ref 65–99)
Glucose-Capillary: 109 mg/dL — ABNORMAL HIGH (ref 65–99)
Glucose-Capillary: 120 mg/dL — ABNORMAL HIGH (ref 65–99)

## 2017-01-31 LAB — COMPREHENSIVE METABOLIC PANEL
ALK PHOS: 74 U/L (ref 38–126)
ALT: 10 U/L — AB (ref 17–63)
AST: 19 U/L (ref 15–41)
Albumin: 2.3 g/dL — ABNORMAL LOW (ref 3.5–5.0)
Anion gap: 13 (ref 5–15)
BUN: 11 mg/dL (ref 6–20)
CALCIUM: 8.1 mg/dL — AB (ref 8.9–10.3)
CO2: 22 mmol/L (ref 22–32)
Chloride: 99 mmol/L — ABNORMAL LOW (ref 101–111)
Creatinine, Ser: 1.11 mg/dL (ref 0.61–1.24)
GFR, EST NON AFRICAN AMERICAN: 59 mL/min — AB (ref >60)
Glucose, Bld: 155 mg/dL — ABNORMAL HIGH (ref 65–99)
Potassium: 3.7 mmol/L (ref 3.5–5.1)
Sodium: 134 mmol/L — ABNORMAL LOW (ref 135–145)
Total Bilirubin: 1.6 mg/dL — ABNORMAL HIGH (ref 0.3–1.2)
Total Protein: 6.3 g/dL — ABNORMAL LOW (ref 6.5–8.1)

## 2017-01-31 LAB — PHOSPHORUS
Phosphorus: 1.1 mg/dL — ABNORMAL LOW (ref 2.5–4.6)
Phosphorus: <1 mg/dL — CL (ref 2.5–4.6)

## 2017-01-31 LAB — PROTIME-INR
INR: 1.16
Prothrombin Time: 14.8 seconds (ref 11.4–15.2)

## 2017-01-31 LAB — HEPARIN LEVEL (UNFRACTIONATED)
Heparin Unfractionated: <0.1 IU/mL — ABNORMAL LOW (ref 0.30–0.70)
Heparin Unfractionated: <0.1 IU/mL — ABNORMAL LOW (ref 0.30–0.70)

## 2017-01-31 LAB — FIBRINOGEN

## 2017-01-31 LAB — MAGNESIUM
MAGNESIUM: 1.7 mg/dL (ref 1.7–2.4)
Magnesium: 1.8 mg/dL (ref 1.7–2.4)

## 2017-01-31 LAB — PROCALCITONIN: Procalcitonin: 2.31 ng/mL

## 2017-01-31 MED ORDER — ORAL CARE MOUTH RINSE
15.0000 mL | Freq: Two times a day (BID) | OROMUCOSAL | Status: DC
  Administered 2017-01-31 – 2017-02-08 (×15): 15 mL via OROMUCOSAL

## 2017-01-31 MED ORDER — LACTATED RINGERS IV SOLN
INTRAVENOUS | Status: DC
  Administered 2017-01-31 – 2017-02-02 (×4): via INTRAVENOUS

## 2017-01-31 MED ORDER — HEPARIN BOLUS VIA INFUSION
2000.0000 [IU] | Freq: Once | INTRAVENOUS | Status: AC
  Administered 2017-01-31: 2000 [IU] via INTRAVENOUS
  Filled 2017-01-31: qty 2000

## 2017-01-31 NOTE — Care Management Note (Signed)
Case Management Note  Patient Details  Name: Zachary Peterson MRN: [id number] Date of Birth: [birth year]  Subjective/Objective:     Pt admitted post seizure               Action/Plan:  PTA independent from home with wife.  Daughter at bedside.  Therapy will be ordered once pt is considered stable.   Expected Discharge Date:                  Expected Discharge Plan:  Home w Home Health Services  In-House Referral:     Discharge planning Services  CM Consult  Post Acute Care Choice:    Choice offered to:     DME Arranged:    DME Agency:     HH Arranged:    HH Agency:     Status of Service:     If discussed at [employer] Meetings, dates discussed:    Additional Comments:  Cherylann ParrClaxton, Rubby Barbary S, RN 01/31/2017, 9:34 AM

## 2017-01-31 NOTE — Progress Notes (Signed)
ANTICOAGULATION CONSULT NOTE   Pharmacy Consult for Heparin when INR <2 (warfarin on hold) Indication: atrial fibrillation  Allergies  Allergen Reactions  . Aspirin Other (See Comments)    CAUSES ULCERS  . Codeine Nausea And Vomiting  . Oxycodone Nausea And Vomiting   Patient Measurements: Height: 5\' 7"  (170.2 cm) Weight: 147 lb 11.3 oz (67 kg) IBW/kg (Calculated) : 66.1  Vital Signs: Temp: 101 F (38.3 C) (01/24 0840) Temp Source: Axillary (01/24 0840) BP: 164/79 (01/24 0539) Pulse Rate: 104 (01/24 0539)  Labs: Recent Labs    01/29/17 0450 01/30/17 0255 01/30/17 1905 01/31/17 0726  HGB  --  12.5*  --  12.8*  HCT  --  37.0*  --  37.5*  PLT  --  236  --  256  LABPROT 36.8* 43.7* 20.4* 14.8  INR 3.75 4.68* 1.76 1.16  CREATININE 1.14 1.15  --  1.11   Estimated Creatinine Clearance: 47.1 mL/min (by C-G formula based on SCr of 1.11 mg/dL).  Medical History: Past Medical History:  Diagnosis Date  . Back pain   . COPD (chronic obstructive pulmonary disease) (HCC)   . Depression   . Fatigue    a. in setting of Afib, improved following DCCV.  [name]. Hearing loss   . History of GI bleed 1976  . Hyperlipemia   . Hypertension   . Migraine   . Palpitations   . Persistent atrial fibrillation (HCC)    a. 02/2016 Echo: EF 66-65%, mild MR;  b. CHA2DS2VASc = 3-->coumadin;  c. 05/2016 TEE/DCCV: EF 45-50%, triv AI, mild to mod MR, no LA/LAA/RA/RAA thrombus, mod to sev TR-->successful DCCV.  . Seizure (HCC)   . Syncope    a. related to seizure disorder   Assessment: 84 y/o M on warfarin PTA for afib, presents to the ED with seizures, [facility] on [facility]/19 for same issue, required intubation 1/20-1/21, Pharmacy was consulted at admission to hold warfarin and start heparin when INR is <2, INR on admit was 2.02, had been trending up every day though no coumadin was given. INR was 4.68 and pt received 10mg  sq vitamin K.  INR dropped below 2 post vitamin k, heparin started last  night. Heparin level undetectable this morning, INR is now down to 1.1. No bleeding issues noted overnight. Hgb stable.   Goal of Therapy:  Heparin level 0.3-0.7 units/ml Monitor platelets by anticoagulation protocol: Yes   Plan:  Increase Heparin to  1100 units/hr  Recheck heparin level tonight  Zachary Peterson Prom PharmD., BCPS Clinical Pharmacist 01/31/2017 10:22 AM

## 2017-01-31 NOTE — Progress Notes (Signed)
ANTICOAGULATION CONSULT NOTE   Pharmacy Consult for Heparin when INR <2 (warfarin on hold) Indication: atrial fibrillation  Allergies  Allergen Reactions  . Aspirin Other (See Comments)    CAUSES ULCERS  . Codeine Nausea And Vomiting  . Oxycodone Nausea And Vomiting   Patient Measurements: Height: 5\' 7"  (170.2 cm) Weight: 147 lb 11.3 oz (67 kg) IBW/kg (Calculated) : 66.1 Heparin dosing weight- 67 kg  Vital Signs: Temp: 101.1 F (38.4 C) (01/24 1956) Temp Source: Oral (01/24 1956) BP: 159/78 (01/24 2000)  Labs: Recent Labs    01/29/17 0450 01/30/17 0255 01/30/17 1905 01/31/17 0726 01/31/17 1919  HGB  --  12.5*  --  12.8*  --   HCT  --  37.0*  --  37.5*  --   PLT  --  236  --  256  --   LABPROT 36.8* 43.7* 20.4* 14.8  --   INR 3.75 4.68* 1.76 1.16  --   HEPARINUNFRC  --   --   --  <0.10* <0.10*  CREATININE 1.14 1.15  --  1.11  --    Estimated Creatinine Clearance: 47.1 mL/min (by C-G formula based on SCr of 1.11 mg/dL).  Medical History: Past Medical History:  Diagnosis Date  . Back pain   . COPD (chronic obstructive pulmonary disease) (HCC)   . Depression   . Fatigue    a. in setting of Afib, improved following DCCV.  [name]. Hearing loss   . History of GI bleed 1976  . Hyperlipemia   . Hypertension   . Migraine   . Palpitations   . Persistent atrial fibrillation (HCC)    a. 02/2016 Echo: EF 66-65%, mild MR;  b. CHA2DS2VASc = 3-->coumadin;  c. 05/2016 TEE/DCCV: EF 45-50%, triv AI, mild to mod MR, no LA/LAA/RA/RAA thrombus, mod to sev TR-->successful DCCV.  . Seizure (HCC)   . Syncope    a. related to seizure disorder   Assessment: 84 y/o M on warfarin PTA for afib, presents to the ED with seizures, [facility], required intubation 1/20-1/21.  Pharmacy was consulted at admission to hold warfarin and start heparin when INR is <2. INR on admit was 2.02 and had been trending up every day though no coumadin was given. INR was reversed  with vitamin K and is now 1.16. Heparin was initiated.    Heparin level remains undetectable at 900 units/hr. Rate changed was never ordered for increase to 1100 units/hr. No bleeding issues noted overnight. Hgb stable.  [facility] but no issues running [facility]. [facility]   Goal of Therapy:  Heparin level 0.3-0.7 units/ml Monitor platelets by anticoagulation protocol: Yes   Plan:  Heparin bolus 2000 units x1.  Increase Heparin to 1100 units/hr.  Recheck heparin level tonight  Link SnufferJessica Maxemiliano Riel, PharmD, BCPS, BCCCP Clinical Pharmacist Clinical phone 01/31/2017 until 11PM [phone]- #[phone] After hours, please call #[phone] 01/31/2017 10:29 PM

## 2017-01-31 NOTE — Progress Notes (Signed)
CRITICAL VALUE ALERT  Critical Value: Phosphorus <1.0  Date & Time Notied: 01/31/2017 2227  Provider Notified: CCM Luanna ColeSmith, Jay   Orders Received/Actions taken: Redraw Lab and notify MD with results.

## 2017-01-31 NOTE — Progress Notes (Signed)
  Speech Language Pathology Treatment: Dysphagia  Patient Details Name: Zachary Peterson MRN: [id number] DOB: July 25, 1931 Today's Date: 01/31/2017 Time: 1205-1224 SLP Time Calculation (min) (ACUTE ONLY): 19 min  Assessment / Plan / Recommendation Clinical Impression  Pts arousal gradually improving today. SLP was able to reposition pt and he sustained eyes open, verbally responsive with delay throughout session. Pt responsive to verbal and tactile cues to accept and manipulate POs, though still quite sluggish. Multiple swallows and occasional cough noted across consistencies. Pt may begin ice chips when alert to improve dry mucosa. If meds are needed they can be given crushed in puree. Otherwise pt to remain NPO. Will f/u tomorrow for expected improved readiness for PO.   HPI HPI: 84 y.o. male with known seizure disorder admitted through ED in respiratory distress following seizure requiring intubation. ETT 1/19-21.       SLP Plan  Continue with current plan of care       Recommendations  Diet recommendations: Other(comment)(ice chips when alert. meds in puree if needed. ) Medication Administration: Crushed with puree                Oral Care Recommendations: Oral care QID Follow up Recommendations: 24 hour supervision/assistance SLP Visit Diagnosis: Dysphagia, unspecified (R13.10) Plan: Continue with current plan of care       GO               [facility] Hospital ArlingtonBonnie Arieliz Latino, MA CCC-SLP [phone]  Claudine MoutonDeBlois, Zachary Peterson 01/31/2017, 2:40 PM

## 2017-01-31 NOTE — Progress Notes (Signed)
eLink Physician-Brief Progress Note Patient Name: Zachary Peterson DOB: 1931/08/24 MRN: [id number]   Date of Service  01/31/2017  HPI/Events of Note  triage  eICU Interventions  Pt triaged to SDU to make ICU bed available for an incoming trauma patient     Intervention Category Major Interventions: Other:  Merwyn KatosDavid B Jaelyne Deeg 01/31/2017, 3:46 AM

## 2017-01-31 NOTE — Progress Notes (Signed)
Neurology Progress Note   S:// Patient seen and examined No acute changes  O:// Current vital signs: BP (!) 164/79 (BP Location: Left Arm)   Pulse (!) 104   Temp 98.7 F (37.1 C) (Axillary)   Resp (!) 27   Ht 5\' 7"  (1.702 m)   Wt 67 kg (147 lb 11.3 oz)   SpO2 95%   BMI 23.13 kg/m  Vital signs in last 24 hours: Temp:  [98.7 F (37.1 C)-100.5 F (38.1 C)] 98.7 F (37.1 C) (01/24 0539) Pulse Rate:  [95-121] 104 (01/24 0539) Resp:  [16-35] 27 (01/24 0539) BP: (127-191)/(65-98) 164/79 (01/24 0539) SpO2:  [90 %-98 %] 95 % (01/24 0732) Weight:  [67 kg (147 lb 11.3 oz)] 67 kg (147 lb 11.3 oz) (01/24 0457) Neurological exam Mental status: Patient was awake with eyes open today, cannot tell me where he is, able to tell me his name, follows some commands slowly by raising his arms barely against gravity. Cranial nerves: Pupils equal round reactive to light, extraocular movements intact, visual fields full to threat, no facial asymmetry. Motor exam: Able to raise both upper extremities antigravity for a few seconds.  Wiggles toes on commands. Tone is normal Sensation: Appreciated light touch on both legs. Did not cooperate with assessing coordination. Gait testing was deferred for patient safety.  Medications  Current Facility-Administered Medications:  .  0.9 %  sodium chloride infusion, 250 mL, Intravenous, PRN, Tobey Grim, NP, Stopped at 01/29/17 0800 .  acetaminophen (TYLENOL) solution 650 mg, 650 mg, Per Tube, Q6H PRN, Karl Ito, MD, 650 mg at 01/28/17 0239 .  acetaminophen (TYLENOL) suppository 650 mg, 650 mg, Rectal, Q4H PRN, Lynnell Jude, MD, 650 mg at 01/30/17 0226 .  arformoterol (BROVANA) nebulizer solution 15 mcg, 15 mcg, Nebulization, BID, Tobey Grim, NP, 15 mcg at 01/31/17 0731 .  budesonide (PULMICORT) nebulizer solution 0.5 mg, 0.5 mg, Nebulization, BID, Janyth Contes, Katalina M, NP, 0.5 mg at 01/31/17 0732 .  feeding supplement (PRO-STAT SUGAR  FREE 64) liquid 30 mL, 30 mL, Per Tube, BID, Duayne Cal, NP .  feeding supplement (VITAL HIGH PROTEIN) liquid 1,000 mL, 1,000 mL, Per Tube, Q24H, Duayne Cal, NP .  heparin ADULT infusion 100 units/mL (25000 units/264mL sodium chloride 0.45%), 900 Units/hr, Intravenous, Continuous, Scatliffe, Kristen D, MD, Last Rate: 9 mL/hr at 01/30/17 2126, 900 Units/hr at 01/30/17 2126 .  ipratropium-albuterol (DUONEB) 0.5-2.5 (3) MG/3ML nebulizer solution 3 mL, 3 mL, Nebulization, Q6H, Tobey Grim, NP, 3 mL at 01/31/17 0731 .  labetalol (NORMODYNE,TRANDATE) injection 20 mg, 20 mg, Intravenous, Q4H PRN, Henry Russel, MD, 20 mg at 01/31/17 0504 .  LORazepam (ATIVAN) injection 1 mg, 1 mg, Intravenous, PRN, Caryl Pina, MD .  LORazepam (ATIVAN) injection 1 mg, 1 mg, Intravenous, Q8H, Milon Dikes, MD, 1 mg at 01/30/17 2214 .  MEDLINE mouth rinse, 15 mL, Mouth Rinse, BID, Scatliffe, Kristen D, MD .  pantoprazole (PROTONIX) injection 40 mg, 40 mg, Intravenous, Daily, Jovita Kussmaul M, NP, 40 mg at 01/30/17 1000 .  phenytoin (DILANTIN) injection 100 mg, 100 mg, Intravenous, TID, Caryl Pina, MD, 100 mg at 01/30/17 2214 .  phosphorus (K PHOS NEUTRAL) tablet 250 mg, 250 mg, Oral, BID, Lynnell Jude, MD .  piperacillin-tazobactam (ZOSYN) IVPB 3.375 g, 3.375 g, Intravenous, Q8H, Marcelle Smiling, MD, Last Rate: 12.5 mL/hr at 01/31/17 0549, 3.375 g at 01/31/17 0549 .  sodium bicarbonate 150 mEq in dextrose 5 % 1,000 mL infusion, , Intravenous, Continuous,  Lynnell Jude, MD, Last Rate: 50 mL/hr at 01/30/17 1800 .  vancomycin (VANCOCIN) 500 mg in sodium chloride 0.9 % 100 mL IVPB, 500 mg, Intravenous, Q12H, Rosaland Lao, RPH, Stopped at 01/31/17 0121 Labs CBC    Component Value Date/Time   WBC 11.1 (H) 01/31/2017 0726   RBC 4.06 (L) 01/31/2017 0726   HGB 12.8 (L) 01/31/2017 0726   HGB 13.9 05/30/2016 1415   HCT 37.5 (L) 01/31/2017 0726   HCT 41.1 05/30/2016 1415   PLT 256 01/31/2017  0726   PLT 312 05/30/2016 1415   MCV 92.4 01/31/2017 0726   MCV 89 05/30/2016 1415   MCV 95 11/23/2013 0356   MCH 31.5 01/31/2017 0726   MCHC 34.1 01/31/2017 0726   RDW 14.4 01/31/2017 0726   RDW 15.2 05/30/2016 1415   RDW 14.9 (H) 11/23/2013 0356   LYMPHSABS 0.8 01/28/2017 0741   LYMPHSABS 1.3 05/30/2016 1415   MONOABS 0.8 01/28/2017 0741   EOSABS 0.0 01/28/2017 0741   EOSABS 0.1 05/30/2016 1415   BASOSABS 0.0 01/28/2017 0741   BASOSABS 0.0 05/30/2016 1415    CMP     Component Value Date/Time   NA 137 01/30/2017 0255   NA 139 05/30/2016 1415   NA 141 11/23/2013 0356   K 4.4 01/30/2017 0255   K 3.2 (L) 11/23/2013 0356   CL 107 01/30/2017 0255   CL 101 11/23/2013 0356   CO2 14 (L) 01/30/2017 0255   CO2 32 11/23/2013 0356   GLUCOSE 91 01/30/2017 0255   GLUCOSE 94 11/23/2013 0356   BUN 13 01/30/2017 0255   BUN 14 05/30/2016 1415   BUN 17 11/23/2013 0356   CREATININE 1.15 01/30/2017 0255   CREATININE 1.16 11/23/2013 0356   CALCIUM 8.1 (L) 01/30/2017 0255   CALCIUM 8.4 (L) 11/23/2013 0356   PROT 6.5 01/30/2017 1035   PROT 7.8 05/30/2016 1415   ALBUMIN 2.4 (L) 01/30/2017 1035   ALBUMIN 4.2 05/30/2016 1415   AST 21 01/30/2017 1035   ALT 11 (L) 01/30/2017 1035   ALKPHOS 77 01/30/2017 1035   BILITOT 1.5 (H) 01/30/2017 1035   BILITOT 0.2 05/30/2016 1415   GFRNONAA 57 (L) 01/30/2017 0255   GFRNONAA >60 11/23/2013 0356   GFRAA >60 01/30/2017 0255   GFRAA >60 11/23/2013 0356   Most recent EEG with generalized slowing and no electro graphic or clinical seizures.  Imaging I have reviewed images in epic and the results pertinent to this consultation are: I reviewed the MRI of the brain and CT scan of the brain, none of which shows any acute changes.  Assessment:  84 year old man with a history of COPD, A. fib on Coumadin, seizure disorder, hyperlipidemia and hypertension presented with multiple seizures, most likely in the setting of acute respiratory failure. He was on  Dilantin at home.  He was started on Keppra here in the hospital.  Upon intubation, he continued to be drowsy.  Per the family, he always has a very strong reaction of drowsiness to Keppra.  Keppra was discontinued and his mental status is improving albeit very slowly. At this time I think he is encephalopathic because of the possible toxic metabolic derangements and the underlying aspiration pneumonia.  I do not suspect underlying seizure or status epilepticus causing his current presentation  Recommendations: Continue Dilantin Seizure precautions Treatment of toxic metabolic derangements and aspiration pneumonia per primary team as you are. I would leave it up to the primary team to see if they feel that Ativan  should be decreased any further.  I am reluctant to make more changes to Ativan as he has been on Ativan chronically and I do not want him to have withdrawal seizures. Neurology will be available as needed.  Please call with questions.  -- Milon DikesAshish Jayln Branscom, MD [facility] Pager: [phone] If 7pm to 7am, please call on call as listed on AMION.

## 2017-01-31 NOTE — Progress Notes (Signed)
CRITICAL VALUE ALERT  Critical Value: Phosphrus <1.0  Date & Time Notied:  01/31/2017 @ 23:52  Provider Notified: CCM Luanna ColeSmith, Jay MD [phone]  Orders Received/Actions taken: MD will place IV med dosing

## 2017-01-31 NOTE — Progress Notes (Signed)
PULMONARY / CRITICAL CARE MEDICINE   Name: Zachary Peterson MRN: [id number] DOB: [birth year]    ADMISSION DATE:  01/26/2017   CHIEF COMPLAINT: Respiratory distress following a seizure.  HISTORY OF PRESENT ILLNESS:    This is an 84 year old with a known seizure disorder which usually manifest as one generalized seizure per year.  He was brought to the emergency room in respiratory distress following a seizure and required intubation and mechanical ventilation.  Family is at the bedside and tell me that he did not have any concurrent acute illness which may have precipitated his seizure.  He was complaining of some abdominal cramping but was otherwise not ill.  I have held his sedation during my exam this morning and he is following directions  for me.  He is in no distress on CPAP 5 with a pressure support of 5.  SUBJECTIVE:  Tolerating extubation.  Continues to be quite lethargic.  According to family he is much more interactive just before a new dose of Ativan is given.  He is quite lethargic during my examination and even in response to sternal rub only gives monosyllabic answers.  VITAL SIGNS: BP (!) 164/79 (BP Location: Left Arm)   Pulse (!) 104   Temp (!) 100.6 F (38.1 C) (Axillary)   Resp (!) 27   Ht 5\' 7"  (1.702 m)   Wt 147 lb 11.3 oz (67 kg)   SpO2 95%   BMI 23.13 kg/m   HEMODYNAMICS:    VENTILATOR SETTINGS:    INTAKE / OUTPUT: I/O last 3 completed shifts: In: 2932.1 [I.V.:2482.1; IV Piggyback:450] Out: 3535 [Urine:3535]  PHYSICAL EXAMINATION: General: Adult male, in NAD.  Thin to cachectic  neuro: Somnolent, awakening only to sternal rub for me and then rapidly falling back to sleep after a few 1 word responses.  He does move both upper extremities.    Cardiovascular: Currently RRR, no MRG Lungs: Respirations unlabored.  CTAB. Abdomen:  BS x 4, S/NT/ND.  LABS:  BMET Recent Labs  Lab 01/29/17 0450 01/30/17 0255 01/31/17 0726  NA 139 137 134*  K 4.0 4.4 3.7   CL 109 107 99*  CO2 19* 14* 22  BUN 12 13 11   CREATININE 1.14 1.15 1.11  GLUCOSE 98 91 155*    Electrolytes Recent Labs  Lab 01/29/17 0450 01/30/17 0255 01/30/17 1035 01/30/17 1905 01/31/17 0726  CALCIUM 8.0* 8.1*  --   --  8.1*  MG  --  1.7 1.9 1.8 1.8  PHOS  --  1.7* 2.1* 1.5* 1.1*    CBC Recent Labs  Lab 01/28/17 0741 01/30/17 0255 01/31/17 0726  WBC 10.7* 10.8* 11.1*  HGB 13.9 12.5* 12.8*  HCT 40.5 37.0* 37.5*  PLT 190 236 256    Coag's Recent Labs  Lab 01/30/17 0255 01/30/17 1905 01/31/17 0726  INR 4.68* 1.76 1.16    Sepsis Markers Recent Labs  Lab 01/27/17 0900 01/27/17 1258 01/30/17 1034 01/30/17 1035 01/30/17 1400 01/31/17 0726  LATICACIDVEN  --  4.6* 1.3  --  1.3  --   PROCALCITON 1.93  --   --  2.39  --  2.31    ABG Recent Labs  Lab 01/27/17 0052 01/28/17 1715  PHART 7.219* 7.331*  PCO2ART 59.9* 37.0  PO2ART 78.0* 88.0    Liver Enzymes Recent Labs  Lab 01/29/17 0450 01/30/17 1035 01/31/17 0726  AST 27 21 19   ALT 12* 11* 10*  ALKPHOS 82 77 74  BILITOT 1.4* 1.5* 1.6*  ALBUMIN 2.6* 2.4*  2.3*    Cardiac Enzymes Recent Labs  Lab 01/26/17 1432 01/26/17 2002  TROPONINI <0.03 <0.03    Glucose Recent Labs  Lab 01/30/17 0746 01/30/17 1617 01/30/17 1953 01/30/17 2312 01/31/17 0350 01/31/17 0850  GLUCAP 82 109* 110* 138* 144* 151*    Imaging No results found. STUDIES: CT head 1/19 > no acute process. EEG 1/20 > no epileptiform discharges or EEG seizures noted. MRI brain 1/22 > No acute intracranial abnormality. Moderately advanced cerebral atrophy with chronic small vessel ischemic disease.  MICRO: Blood 1/21 > GPC's >   DISCUSSION: 84 year old with a history of seizure disorder which manifests itself is approximately 1 tonic-clonic seizure per year.  He suffered from a seizure yesterday and was in respiratory distress subsequent to the seizure and was intubated and mechanically ventilated.  Seizures improved  with AEDs under the direction of neurology and he was able to be extubated 1/21. Course now complicated by lethargy. Also blood cultures growing GPC, may be contaminant, following. Metabolic acidosis developed 1/23, cause unclear.   ASSESSMENT / PLAN:  Respiratory insufficiency - s/p extubation 1/21. Possible PNA - bibasilar infiltrate. Hx COPD. P: Supplemental O2 as indicated Continue BD's. Incentive spirometry Zosyn for aspiration PNA.   Hx A.fib (on coumadin), HTN, HLD. P: Continue to hold anticoagulation as INR rising Repeat INR, Assess LFT, Fibrinogen Will need to resume preadmission coumadin at some point. Monitor hemodynamics.  Seizure disorder - was on keppra but had stopped taking on own. Lethargy lethargy persists despite treatment of underlying suspected aspiration and correction of electrolyte abnormalities.  His Ativan has been discontinued today.  He has not had any clinical seizure activity to suggest that this is an intra-ictal state.   Hx syncope (felt to be due to seizures), migraines, depression, back pain.  P: AED's per neuro.  Metabolic acidosis Corrected, lactate was negative  Dysphagia SLP following - NPO rec Place cortrak to start TF  ? Bacteremia - Blood cultures from 1/21 preliminarily growing GPC's. P: Continue vanc. Follow cultures through completion. Echo if cultures remain positive.   Penny Pia, MD [facility] Pager [phone] or [phone]  01/31/2017 12:05 PM

## 2017-01-31 NOTE — Progress Notes (Signed)
Initial Nutrition Assessment  DOCUMENTATION CODES:   Not applicable  INTERVENTION:   -D/c Vital High Protein -D/c Prostat -RD will follow for diet advancement and supplement diet as appropriate -If pt requires TF, recommend:  Initiate Jevity 1.2 @ 20 ml/hr and increase by 10 ml every 4 hours to goal rate of 60 ml/hr.   Tube feeding regimen provides 1728 kcal (100% of needs), 80 grams of protein, and 1162 ml of H2O.   NUTRITION DIAGNOSIS:   Inadequate oral intake related to inability to eat, lethargy/confusion as evidenced by NPO status.  GOAL:   Patient will meet greater than or equal to 90% of their needs  MONITOR:   Diet advancement, Labs, Weight trends, Skin, I & O's  REASON FOR ASSESSMENT:   Consult Enteral/tube feeding initiation and management  ASSESSMENT:   84 year old male presents to ED s/p 6-10 witnessed seizures with progressive hypoxia and concern for aspiration.  1/21- extubated 1/22- s.p BSE, recommend NPO 1/23- s/p repeat BSE, recommend NPO  Pt in with MD at time of visit. Unable to complete nutrition-focused physical exam this time.   Case discussed with RN and pt daughter. RN shares that order for cortrak tube was placed yesterday, however, unable to place related to INR levels. Pt has bene very lethargic over the past few days, possibly related to Ativan. Per RN, pt is a lot more alert today and plan is to re-eval swallow function. Pt currently has no feeding access.  Spoke with pt daughter outside of the room. She reports that she is hopeful that pt will pass swallow eval today, as he is much more alert; she is hoping pt will not need TF. PTA, pt was in his usual good state of health. Per pt daughter, pt had a very good appetite and had no diet restrictions ("he ate whatever he wanted"). Pt daughter denies that pt has lost wt. Per wt hx, noted wt stability over the past year. Noted 2.6% wt loss over the past year, which is not significant for time  frame.   Per discussion with RN, unable to place cortrak today due to service unavailable. Instructed RN to either place NGT or consult IR for feeding tube placement in the event that pt requires feeding tube today. Cortrak service will be available on 02/01/17 and 02/02/17.   Labs reviewed: Na: 134, CBGS: 110-151.   Diet Order:  Aspiration precautions Seizure precautions  EDUCATION NEEDS:   Education needs have been addressed  Skin:  Skin Assessment: Reviewed RN Assessment  Last BM:  01/27/17  Height:   Ht Readings from Last 1 Encounters:  01/27/17 5\' 7"  (1.702 m)    Weight:   Wt Readings from Last 1 Encounters:  01/31/17 147 lb 11.3 oz (67 kg)    Ideal Body Weight:  62.3 kg  BMI:  Body mass index is 23.13 kg/m.  Estimated Nutritional Needs:   Kcal:  1600-1800  Protein:  75-90 grams  Fluid:  1.6-1.8 L    Kree Rafter A. Mayford KnifeWilliams, RD, LDN, CDE Pager: [phone] After hours Pager: [phone]

## 2017-02-01 ENCOUNTER — Inpatient Hospital Stay (HOSPITAL_COMMUNITY): Payer: Medicare HMO

## 2017-02-01 LAB — PHENYTOIN LEVEL, TOTAL: Phenytoin Lvl: 10.4 ug/mL (ref 10.0–20.0)

## 2017-02-01 LAB — BASIC METABOLIC PANEL
ANION GAP: 11 (ref 5–15)
BUN: 13 mg/dL (ref 6–20)
CALCIUM: 7.7 mg/dL — AB (ref 8.9–10.3)
CO2: 23 mmol/L (ref 22–32)
Chloride: 101 mmol/L (ref 101–111)
Creatinine, Ser: 1.13 mg/dL (ref 0.61–1.24)
GFR calc non Af Amer: 58 mL/min — ABNORMAL LOW (ref >60)
GLUCOSE: 133 mg/dL — AB (ref 65–99)
POTASSIUM: 3.5 mmol/L (ref 3.5–5.1)
SODIUM: 135 mmol/L (ref 135–145)

## 2017-02-01 LAB — GLUCOSE, CAPILLARY
GLUCOSE-CAPILLARY: 126 mg/dL — AB (ref 65–99)
GLUCOSE-CAPILLARY: 141 mg/dL — AB (ref 65–99)
GLUCOSE-CAPILLARY: 160 mg/dL — AB (ref 65–99)
Glucose-Capillary: 133 mg/dL — ABNORMAL HIGH (ref 65–99)

## 2017-02-01 LAB — HEPARIN LEVEL (UNFRACTIONATED)
HEPARIN UNFRACTIONATED: 0.14 [IU]/mL — AB (ref 0.30–0.70)
Heparin Unfractionated: <0.1 IU/mL — ABNORMAL LOW (ref 0.30–0.70)

## 2017-02-01 LAB — VANCOMYCIN, TROUGH: VANCOMYCIN TR: 12 ug/mL — AB (ref 15–20)

## 2017-02-01 LAB — CBC WITH DIFFERENTIAL/PLATELET
BASOS PCT: 0 %
Basophils Absolute: 0 10*3/uL (ref 0.0–0.1)
Eosinophils Absolute: 0.1 10*3/uL (ref 0.0–0.7)
Eosinophils Relative: 2 %
HEMATOCRIT: 30.9 % — AB (ref 39.0–52.0)
HEMOGLOBIN: 10.5 g/dL — AB (ref 13.0–17.0)
LYMPHS PCT: 11 %
Lymphs Abs: 0.9 10*3/uL (ref 0.7–4.0)
MCH: 31.3 pg (ref 26.0–34.0)
MCHC: 34 g/dL (ref 30.0–36.0)
MCV: 92 fL (ref 78.0–100.0)
Monocytes Absolute: 0.9 10*3/uL (ref 0.1–1.0)
Monocytes Relative: 11 %
NEUTROS PCT: 76 %
Neutro Abs: 6.2 10*3/uL (ref 1.7–7.7)
Platelets: 253 10*3/uL (ref 150–400)
RBC: 3.36 MIL/uL — AB (ref 4.22–5.81)
RDW: 14.4 % (ref 11.5–15.5)
WBC: 8.1 10*3/uL (ref 4.0–10.5)

## 2017-02-01 LAB — PROCALCITONIN: PROCALCITONIN: 2.02 ng/mL

## 2017-02-01 LAB — AMMONIA: AMMONIA: 34 umol/L (ref 9–35)

## 2017-02-01 LAB — TSH: TSH: 1.239 u[IU]/mL (ref 0.350–4.500)

## 2017-02-01 LAB — PROTIME-INR
INR: 1.11
PROTHROMBIN TIME: 14.2 s (ref 11.4–15.2)

## 2017-02-01 LAB — PHOSPHORUS: PHOSPHORUS: 1.5 mg/dL — AB (ref 2.5–4.6)

## 2017-02-01 MED ORDER — BISOPROLOL FUMARATE 5 MG PO TABS
2.5000 mg | ORAL_TABLET | Freq: Two times a day (BID) | ORAL | Status: DC
  Administered 2017-02-02 – 2017-02-05 (×6): 2.5 mg via ORAL
  Filled 2017-02-01 (×7): qty 1

## 2017-02-01 MED ORDER — RESOURCE THICKENUP CLEAR PO POWD
ORAL | Status: DC | PRN
  Filled 2017-02-01: qty 125

## 2017-02-01 MED ORDER — MIRTAZAPINE 7.5 MG PO TABS
15.0000 mg | ORAL_TABLET | Freq: Every day | ORAL | Status: DC
  Administered 2017-02-02 – 2017-02-08 (×8): 15 mg via ORAL
  Filled 2017-02-01 (×8): qty 2

## 2017-02-01 MED ORDER — IPRATROPIUM-ALBUTEROL 0.5-2.5 (3) MG/3ML IN SOLN: 3.0000 mL | Freq: Four times a day (QID) | RESPIRATORY_TRACT | Status: DC | PRN

## 2017-02-01 MED ORDER — WARFARIN - PHARMACIST DOSING INPATIENT
Freq: Every day | Status: DC
  Administered 2017-02-01 – 2017-02-05 (×2)
  Administered 2017-02-06: 1
  Administered 2017-02-08: 18:00:00

## 2017-02-01 MED ORDER — HEPARIN (PORCINE) IN NACL 100-0.45 UNIT/ML-% IJ SOLN
1750.0000 [IU]/h | INTRAMUSCULAR | Status: DC
  Administered 2017-02-03 – 2017-02-04 (×2): 1650 [IU]/h via INTRAVENOUS
  Administered 2017-02-05: 1700 [IU]/h via INTRAVENOUS
  Administered 2017-02-05: 1650 [IU]/h via INTRAVENOUS
  Administered 2017-02-06 – 2017-02-08 (×4): 1750 [IU]/h via INTRAVENOUS
  Filled 2017-02-01 (×10): qty 250

## 2017-02-01 MED ORDER — WARFARIN SODIUM 7.5 MG PO TABS
7.5000 mg | ORAL_TABLET | Freq: Once | ORAL | Status: AC
  Administered 2017-02-01: 7.5 mg via ORAL
  Filled 2017-02-01: qty 1

## 2017-02-01 MED ORDER — HEPARIN BOLUS VIA INFUSION
2000.0000 [IU] | Freq: Once | INTRAVENOUS | Status: AC
  Administered 2017-02-01: 2000 [IU] via INTRAVENOUS
  Filled 2017-02-01: qty 2000

## 2017-02-01 MED ORDER — POTASSIUM PHOSPHATES 15 MMOLE/5ML IV SOLN
30.0000 mmol | Freq: Once | INTRAVENOUS | Status: AC
  Administered 2017-02-01: 30 mmol via INTRAVENOUS
  Filled 2017-02-01: qty 10

## 2017-02-01 MED ORDER — VANCOMYCIN HCL IN DEXTROSE 750-5 MG/150ML-% IV SOLN
750.0000 mg | Freq: Two times a day (BID) | INTRAVENOUS | Status: DC
  Administered 2017-02-01 – 2017-02-03 (×5): 750 mg via INTRAVENOUS
  Filled 2017-02-01 (×7): qty 150

## 2017-02-01 NOTE — Progress Notes (Signed)
eLink Physician-Brief Progress Note Patient Name: Zachary RavelSamuel D Peterson DOB: [birth year] MRN: [id number]   Date of Service  02/01/2017  HPI/Events of Note  Low phos.  Less than 1 and not able to take oral replacement  eICU Interventions  IV replacement ordered     Intervention Category Minor Interventions: Electrolytes abnormality - evaluation and management  Henry RusselSMITH, Kerly Rigsbee, P 02/01/2017, 12:02 AM

## 2017-02-01 NOTE — Progress Notes (Addendum)
Pharmacy Antibiotic Note  Zachary RavelSamuel D Peterson is a 84 y.o. male with PNA and concern of bacteremia  Pharmacy has been consulted for vancomycin and zosyn dosing. -WBC= 8.1, tmax= 102, CrCl ~ 45 -cultures with CONS 1/2 (possible contaminant), plans to repeat cultures -vancomycin trough= 12 on 500mg  IV q12h  Plan: --No changes in zosyn dosing -Change vancomycin to 750mg  IV q12h -Will follow renal function, cultures and clinical progress   Harland Germanndrew Atzin Buchta, Pharm D 02/01/2017 9:13 AM     Height: 5\' 7"  (170.2 cm) Weight: 158 lb 11.7 oz (72 kg) IBW/kg (Calculated) : 66.1  Temp (24hrs), Avg:100.3 F (37.9 C), Min:98.6 F (37 C), Max:102 F (38.9 C)  Recent Labs  Lab 01/27/17 0420 01/27/17 0836 01/27/17 1258 01/28/17 0741 01/29/17 0450 01/30/17 0255 01/30/17 1034 01/30/17 1400 01/31/17 0726 02/01/17 0251  WBC 11.3*  --   --  10.7*  --  10.8*  --   --  11.1* 8.1  CREATININE 1.23  --   --  1.28* 1.14 1.15  --   --  1.11 1.13  LATICACIDVEN 3.8* 5.2* 4.6*  --   --   --  1.3 1.3  --   --     Estimated Creatinine Clearance: 46.3 mL/min (by C-G formula based on SCr of 1.13 mg/dL).    Allergies  Allergen Reactions  . Aspirin Other (See Comments)    CAUSES ULCERS  . Codeine Nausea And Vomiting  . Oxycodone Nausea And Vomiting    Antimicrobials this admission: Zosyn 1/20>> Vanc 1/22>>  Dose adjustments this admission: 1/25 vancomycin trough= 12 on 500mg  IV q12h  Microbiology results: 1/20 MRSA PCR neg  1/21 Blood - 1/2 GPC (BCID = staph>>likely contaminant) 1/21 TA - few yeast 1/20 TA - normal flora  Thank you for allowing pharmacy to be a part of this patient's care.  Harland GermanAndrew Nikkita Adeyemi, Pharm D 02/01/2017 9:14 AM

## 2017-02-01 NOTE — Progress Notes (Signed)
  Speech Language Pathology Treatment: Dysphagia  Patient Details Name: Zachary Peterson Zachary Peterson MRN: [id number] DOB: March 09, 1931 Today's Date: Peterson Time: [id number] SLP Time Calculation (min) (ACUTE ONLY): 24 min  Assessment / Plan / Recommendation Clinical Impression  Pt demonstrates improved arousal today, oral hygiene also improved. SLP repositioned pt and offered trials of ice, thin liquids puree and honey thick liquids. Pt continues to demosntrate concern for sow oral transit and possible delayed swallow initiation. Trials of thin textures still result in some throat clearing and increased risk of aspiration. Pt tolerated puree and honey thick liquids well, initiating two swallows consistently per bolus. Appears fatigued with intake, RR slightly increased. Recommend pt initiate purees and honey thick liquids in moderation when alert and stable.Will f/u for tolerance or need for eventual objective testing.   HPI HPI: 84 y.o. male with known seizure disorder admitted through ED in respiratory distress following seizure requiring intubation. ETT 1/19-21.       SLP Plan  Continue with current plan of care       Recommendations  Diet recommendations: Dysphagia 1 (puree);Honey-thick liquid Liquids provided via: Cup;Teaspoon Medication Administration: Crushed with puree Supervision: Full supervision/cueing for compensatory strategies Compensations: Slow rate;Small sips/bites;Multiple dry swallows after each bite/sip Postural Changes and/or Swallow Maneuvers: Seated upright 90 degrees;Upright 30-60 min after meal                Oral Care Recommendations: Oral care BID Follow up Recommendations: Skilled Nursing facility SLP Visit Diagnosis: Dysphagia, unspecified (R13.10) Plan: Continue with current plan of care       GO               [facility]leskill Regional HospitalBonnie Hallis Meditz, MA CCC-SLP [phone]  Zachary Peterson, Zachary Peterson Zachary Peterson, 10:14 AM

## 2017-02-01 NOTE — Care Management Note (Signed)
Case Management Note  Patient Details  Name: Zachary RavelSamuel D Naval MRN: [id number] Date of Birth: [birth year]  Subjective/Objective:     Pt admitted post seizure               Action/Plan:  PTA independent from home with wife.  Daughter at bedside.  Therapy will be ordered once pt is considered stable.   Expected Discharge Date:                  Expected Discharge Plan:  Home w Home Health Services  In-House Referral:     Discharge planning Services  CM Consult  Post Acute Care Choice:    Choice offered to:     DME Arranged:    DME Agency:     HH Arranged:    HH Agency:     Status of Service:     If discussed at [employer] Meetings, dates discussed:    Additional Comments: 02/01/2017 Per bedside nurse - pt is mostly total care at this point.  PT/OT ordered  Cherylann Parrlaxton, Zunairah Devers S, RN 02/01/2017, 11:05 AM

## 2017-02-01 NOTE — Progress Notes (Signed)
PT Cancellation Note  Patient Details Name: Zachary RavelSamuel D Melvin MRN: [id number] DOB: 09-17-1931   Cancelled Treatment:    Reason Eval/Treat Not Completed: Patient not medically ready. Per RN, pt currently with too high BP. Will follow-up for PT evaluation when medically appropriate.  Ina HomesJaclyn Brendaly Townsel, PT, DPT Acute Rehab Services  Pager: [phone]  Malachy ChamberJaclyn L Celvin Taney 02/01/2017, 4:40 PM

## 2017-02-01 NOTE — Progress Notes (Addendum)
ANTICOAGULATION CONSULT NOTE   Pharmacy Consult for Heparin, warfarin Indication: atrial fibrillation  Allergies  Allergen Reactions  . Aspirin Other (See Comments)    CAUSES ULCERS  . Codeine Nausea And Vomiting  . Oxycodone Nausea And Vomiting   Patient Measurements: Height: 5\' 7"  (170.2 cm) Weight: 158 lb 11.7 oz (72 kg) IBW/kg (Calculated) : 66.1 Heparin dosing weight- 67 kg  Vital Signs: Temp: 100.2 F (37.9 C) (01/25 0340) Temp Source: Axillary (01/25 0340) BP: 146/63 (01/25 0340) Pulse Rate: 105 (01/25 0745)  Labs: Recent Labs    01/30/17 0255 01/30/17 1905 01/31/17 0726 01/31/17 1919 02/01/17 0251 02/01/17 0558  HGB 12.5*  --  12.8*  --  10.5*  --   HCT 37.0*  --  37.5*  --  30.9*  --   PLT 236  --  256  --  253  --   LABPROT 43.7* 20.4* 14.8  --  14.2  --   INR 4.68* 1.76 1.16  --  1.11  --   HEPARINUNFRC  --   --  <0.10* <0.10*  --  <0.10*  CREATININE 1.15  --  1.11  --  1.13  --    Estimated Creatinine Clearance: 46.3 mL/min (by C-G formula based on SCr of 1.13 mg/dL).  Medical History: Past Medical History:  Diagnosis Date  . Back pain   . COPD (chronic obstructive pulmonary disease) (HCC)   . Depression   . Fatigue    a. in setting of Afib, improved following DCCV.  [name]. Hearing loss   . History of GI bleed 1976  . Hyperlipemia   . Hypertension   . Migraine   . Palpitations   . Persistent atrial fibrillation (HCC)    a. 02/2016 Echo: EF 66-65%, mild MR;  b. CHA2DS2VASc = 3-->coumadin;  c. 05/2016 TEE/DCCV: EF 45-50%, triv AI, mild to mod MR, no LA/LAA/RA/RAA thrombus, mod to sev TR-->successful DCCV.  . Seizure (HCC)   . Syncope    a. related to seizure disorder   Assessment: 84 y/o M on warfarin PTA for afib, presents to the ED with seizures, [facility] same issue, required intubation 1/20-1/21.  Pharmacy was consulted at admission to hold warfarin and start heparin when INR is <2. INR on admit was 2.02 and had been  trending up every day though no coumadin was given. INR was reversed with vitamin K and is now 1.16. Heparin was initiated and patient remains NPO. Warfarin to restart today -heparin level is undetectable -Hg= 10.5 with down trend -INR= 1.11   Home coumadin dose: 5mg  po daily   Goal of Therapy:  Heparin level 0.3-0.7 units/ml Monitor platelets by anticoagulation protocol: Yes   Plan:  Heparin bolus 2000 units x1 them increase Heparin to 1350 units/hr.  Heparin level in 6 hours and daily wth CBC daily Coumadin 7.5 mg po today Daily PT/INR  Harland GermanAndrew Aleyda Gindlesperger, Pharm D 02/01/2017 9:07 AM

## 2017-02-01 NOTE — Progress Notes (Signed)
Nutrition Follow-up  DOCUMENTATION CODES:   Not applicable  INTERVENTION:   -Magic Cup TID with meals  NUTRITION DIAGNOSIS:   Inadequate oral intake related to lethargy/confusion as evidenced by meal completion < 25%.  Ongoing  GOAL:   Patient will meet greater than or equal to 90% of their needs  Progressing  MONITOR:   PO intake, Supplement acceptance, Diet advancement, Labs, Weight trends, Skin, I & O's  REASON FOR ASSESSMENT:   Consult Enteral/tube feeding initiation and management  ASSESSMENT:   84 year old male presents to ED s/p 6-10 witnessed seizures with progressive hypoxia and concern for aspiration.  1/21- extubated 1/22- s.p BSE, recommend NPO 1/23- s/p repeat BSE, recommend NPO 1/24- s/p repeat BSE, advanced to dysphagia 1 diet with honey thick liquids  Case discussed with RN and cortrak RD prior to visit; plans for cortrak tube have been d/c, as pt passed swallow evaluation this morning.   Spoke with pt and wife at bedside. Pt easily distractible, but able to interact with this RD. Pt was consuming chocolate pudding at time of visit (with assistance from wife) without difficulty. Per pt wife, pt with waxing and waning mental status- mental status is about the same from yesterday. Pt does not care for pureed foods, but enjoys sweet foods such as yogurt and ice cream. Pt and wife agreeable to try [employer] Cup. Discussed food items allowed on prescribed diet and potential for diet advancement once pt is more alert.   Pt wife agreed with hx obtained from daughter yesterday (eating well PTA, denies wt loss).   Labs reviewed: CBGS: 125-133.   NUTRITION - FOCUSED PHYSICAL EXAM:    Most Recent Value  Orbital Region  Moderate depletion  Upper Arm Region  No depletion  Thoracic and Lumbar Region  No depletion  Buccal Region  No depletion  Temple Region  Mild depletion  Clavicle Bone Region  No depletion  Clavicle and Acromion Bone Region  No depletion   Scapular Bone Region  No depletion  Dorsal Hand  No depletion  Patellar Region  Mild depletion  Anterior Thigh Region  Mild depletion  Posterior Calf Region  Mild depletion  Edema (RD Assessment)  Mild  Hair  Reviewed  Eyes  Reviewed  Mouth  Reviewed  Skin  Reviewed  Nails  Reviewed       Diet Order:  Aspiration precautions Seizure precautions DIET - DYS 1 Room service appropriate? Yes; Fluid consistency: Honey Thick  EDUCATION NEEDS:   Education needs have been addressed  Skin:  Skin Assessment: Reviewed RN Assessment  Last BM:  01/27/17  Height:   Ht Readings from Last 1 Encounters:  01/27/17 5\' 7"  (1.702 m)    Weight:   Wt Readings from Last 1 Encounters:  02/01/17 158 lb 11.7 oz (72 kg)    Ideal Body Weight:  62.3 kg  BMI:  Body mass index is 24.86 kg/m.  Estimated Nutritional Needs:   Kcal:  1600-1800  Protein:  75-90 grams  Fluid:  1.6-1.8 L    Jamani Eley A. Mayford KnifeWilliams, RD, LDN, CDE Pager: [phone] After hours Pager: [phone]

## 2017-02-01 NOTE — Progress Notes (Signed)
Respiratory assessment done and patient scored a 6. Patient takes treatments at home PRN and Spiriva QD. Currently on Pulmicort and Brovana BID and changing to PRN duo neb. Patient has clear BBS throughout with some diminished in the lower right base. Patient will be reassessed as needed per RT protocol.

## 2017-02-01 NOTE — Progress Notes (Signed)
ANTICOAGULATION CONSULT NOTE   Pharmacy Consult for Heparin Indication: atrial fibrillation  Allergies  Allergen Reactions  . Aspirin Other (See Comments)    CAUSES ULCERS  . Codeine Nausea And Vomiting  . Oxycodone Nausea And Vomiting   Patient Measurements: Height: 5\' 7"  (170.2 cm) Weight: 158 lb 11.7 oz (72 kg) IBW/kg (Calculated) : 66.1 Heparin dosing weight- 67 kg  Vital Signs: Temp: 98.8 F (37.1 C) (01/25 1601) Temp Source: Oral (01/25 1601) BP: 191/62 (01/25 1601) Pulse Rate: 99 (01/25 1601)  Labs: Recent Labs    01/30/17 0255 01/30/17 1905  01/31/17 0726 01/31/17 1919 02/01/17 0251 02/01/17 0558 02/01/17 1552  HGB 12.5*  --   --  12.8*  --  10.5*  --   --   HCT 37.0*  --   --  37.5*  --  30.9*  --   --   PLT 236  --   --  256  --  253  --   --   LABPROT 43.7* 20.4*  --  14.8  --  14.2  --   --   INR 4.68* 1.76  --  1.16  --  1.11  --   --   HEPARINUNFRC  --   --    < > <0.10* <0.10*  --  <0.10* 0.14*  CREATININE 1.15  --   --  1.11  --  1.13  --   --    < > = values in this interval not displayed.   Estimated Creatinine Clearance: 46.3 mL/min (by C-G formula based on SCr of 1.13 mg/dL).  Medical History: Past Medical History:  Diagnosis Date  . Back pain   . COPD (chronic obstructive pulmonary disease) (HCC)   . Depression   . Fatigue    a. in setting of Afib, improved following DCCV.  [name]. Hearing loss   . History of GI bleed 1976  . Hyperlipemia   . Hypertension   . Migraine   . Palpitations   . Persistent atrial fibrillation (HCC)    a. 02/2016 Echo: EF 66-65%, mild MR;  b. CHA2DS2VASc = 3-->coumadin;  c. 05/2016 TEE/DCCV: EF 45-50%, triv AI, mild to mod MR, no LA/LAA/RA/RAA thrombus, mod to sev TR-->successful DCCV.  . Seizure (HCC)   . Syncope    a. related to seizure disorder   Assessment: 84 y/o M on warfarin PTA for afib, presents to the ED with seizures, [facility] for same issue, required intubation 1/20-1/21.   Pharmacy was consulted at admission to hold warfarin and start heparin when INR is <2. INR on admit was 2.02 and had been trending up every day though no coumadin was given. INR was reversed with vitamin K and is now 1.16. Heparin was initiated and patient remains NPO. Warfarin to restart today -heparin level is undetectable -Hg= 10.5 with down trend -INR= 1.11   Home coumadin dose: 5mg  po daily   PM f/u> heparin level remains below goal despite earlier rate increases.  No overt bleeding or complications noted.  Goal of Therapy:  Heparin level 0.3-0.7 units/ml Monitor platelets by anticoagulation protocol: Yes   Plan:  Increase IV heparin to 1500 units/hr. Check heparin level in 8 hrs Daily heparin level, CBC and INR.  Tad MooreJessica Shanard Treto, Pharm D, BCPS  Clinical Pharmacist Pager [phone]  02/01/2017 4:48 PM

## 2017-02-01 NOTE — Progress Notes (Signed)
PULMONARY / CRITICAL CARE MEDICINE   Name: Zachary RavelSamuel D Brandel MRN: [id number] DOB: 1931-03-16    ADMISSION DATE:  01/26/2017   CHIEF COMPLAINT: Respiratory distress following a seizure.  HISTORY OF PRESENT ILLNESS:    This is an 84 year old with a known seizure disorder which usually manifest as one generalized seizure per year.  He was brought to the emergency room in respiratory distress following a seizure and required intubation and mechanical ventilation.  Family is at the bedside and tell me that he did not have any concurrent acute illness which may have precipitated his seizure.  He was complaining of some abdominal cramping but was otherwise not ill.  I have held his sedation during my exam this morning and he is following directions  for me.  He is in no distress on CPAP 5 with a pressure support of 5.  SUBJECTIVE:  Ativan D/C yesterday, much more awake today. Passed swallow eval with modified foods/liquids. Conversant to a degree.   VITAL SIGNS: BP (!) 146/63 (BP Location: Left Arm)   Pulse (!) 105   Temp 100.2 F (37.9 C) (Axillary)   Resp (!) 25   Ht 5\' 7"  (1.702 m)   Wt 72 kg (158 lb 11.7 oz)   SpO2 96%   BMI 24.86 kg/m   HEMODYNAMICS:    VENTILATOR SETTINGS:    INTAKE / OUTPUT: I/O last 3 completed shifts: In: 4223.3 [I.V.:3173.3; IV Piggyback:1050] Out: 2335 [Urine:2335]  PHYSICAL EXAMINATION: General: Adult male, in NAD.  Thin to cachectic  neuro: Spontaneously awake and following commands. Responds appropriately.    Cardiovascular: RRR, no MRG Lungs: Respirations unlabored.  Diminished R.  Abdomen:  BS x 4, S/NT/ND.  LABS:  BMET Recent Labs  Lab 01/30/17 0255 01/31/17 0726 02/01/17 0251  NA 137 134* 135  K 4.4 3.7 3.5  CL 107 99* 101  CO2 14* 22 23  BUN 13 11 13   CREATININE 1.15 1.11 1.13  GLUCOSE 91 155* 133*    Electrolytes Recent Labs  Lab 01/30/17 0255  01/30/17 1905 01/31/17 0726 01/31/17 1919 01/31/17 2259 02/01/17 0251   CALCIUM 8.1*  --   --  8.1*  --   --  7.7*  MG 1.7   < > 1.8 1.8 1.7  --   --   PHOS 1.7*   < > 1.5* 1.1* <1.0* <1.0*  --    < > = values in this interval not displayed.    CBC Recent Labs  Lab 01/30/17 0255 01/31/17 0726 02/01/17 0251  WBC 10.8* 11.1* 8.1  HGB 12.5* 12.8* 10.5*  HCT 37.0* 37.5* 30.9*  PLT 236 256 253    Coag's Recent Labs  Lab 01/30/17 1905 01/31/17 0726 02/01/17 0251  INR 1.76 1.16 1.11    Sepsis Markers Recent Labs  Lab 01/27/17 1258 01/30/17 1034 01/30/17 1035 01/30/17 1400 01/31/17 0726 02/01/17 0251  LATICACIDVEN 4.6* 1.3  --  1.3  --   --   PROCALCITON  --   --  2.39  --  2.31 2.02    ABG Recent Labs  Lab 01/27/17 0052 01/28/17 1715  PHART 7.219* 7.331*  PCO2ART 59.9* 37.0  PO2ART 78.0* 88.0    Liver Enzymes Recent Labs  Lab 01/29/17 0450 01/30/17 1035 01/31/17 0726  AST 27 21 19   ALT 12* 11* 10*  ALKPHOS 82 77 74  BILITOT 1.4* 1.5* 1.6*  ALBUMIN 2.6* 2.4* 2.3*    Cardiac Enzymes Recent Labs  Lab 01/26/17 1432 01/26/17 2002  TROPONINI <  0.03 <0.03    Glucose Recent Labs  Lab 01/31/17 0350 01/31/17 0850 01/31/17 1235 01/31/17 1723 01/31/17 2033 02/01/17 0841  GLUCAP 144* 151* 132* 109* 120* 126*    Imaging Dg Chest Port 1 View  Result Date: 02/01/2017 CLINICAL DATA:  Aspiration. EXAM: PORTABLE CHEST 1 VIEW COMPARISON:  01/29/2017. FINDINGS: Heart size normal. Diffuse bilateral pulmonary interstitial prominence noted. These changes may be related interstitial edema and/or pneumonitis. No pleural effusion or pneumothorax. No acute bony abnormality. IMPRESSION: Diffuse bilateral pulmonary interstitial prominence noted. These changes may be related to interstitial edema and/or pneumonitis. Electronically Signed   By: Maisie Fus  Register   On: 02/01/2017 08:44   STUDIES: CT head 1/19 > no acute process. EEG 1/20 > no epileptiform discharges or EEG seizures noted. MRI brain 1/22 > No acute intracranial  abnormality. Moderately advanced cerebral atrophy with chronic small vessel ischemic disease.  MICRO: Blood 1/21 > Coag neg staph. Likely contaminant.  Resp 1/21 neg  DISCUSSION: 84 year old with a history of seizure disorder which manifests itself is approximately 1 tonic-clonic seizure per year.  He suffered from a seizure yesterday and was in respiratory distress subsequent to the seizure and was intubated and mechanically ventilated.  Seizures improved with AEDs under the direction of neurology and he was able to be extubated 1/21. Course now complicated by lethargy and suspected aspiration PNA. He was started on broad ABX and cultures were negative with the exception of coag neg staph in 1/2 bottles thought to be a contaminant. 1/24 Ativan stopped entirely and by the next day he was much more awake and interactive.   ASSESSMENT / PLAN:  Respiratory insufficiency - s/p extubation 1/21. Possible PNA - bibasilar infiltrate > worse 1/25 despite 4 days ABX COPD without acute exacerbation.  P: Supplemental O2 as indicated Continue BD's. Incentive spirometry Zosyn for aspiration PNA.   Hx A.fib (on coumadin), HTN, HLD. P: INR normalized and now swallowing so will resume anticoagulation Follow INR Monitor hemodynamics.  Seizure disorder - was on keppra but had stopped taking on own. Lethargy > improved after ativan DC Hx syncope (felt to be due to seizures), migraines, depression, back pain.  P: Dilantin continue.  Neuro signed off.   Dysphagia > has passed swallow eval.  Diet per SLP  Hypophosphatemia - replaced by EMD will repeat lab in evening.   ? Bacteremia - Blood cultures from 1/21 preliminarily growing GPC's. Fevers > continue despite 4 days ABX. Consider dehydration, ATX as potential causes.  P: Continue vancomycin, low threshold to DC, but fevers persist and PCT remains elevated without significant drop-off. Repeat BC Echo if cultures remain positive.   Joneen Roach, AGACNP-BC [facility] Pulmonology/Critical Care Pager [phone] or [phone]  02/01/2017 9:06 AM

## 2017-02-02 ENCOUNTER — Inpatient Hospital Stay (HOSPITAL_COMMUNITY): Payer: Medicare HMO

## 2017-02-02 DIAGNOSIS — J9602 Acute respiratory failure with hypercapnia: Secondary | ICD-10-CM

## 2017-02-02 DIAGNOSIS — J690 Pneumonitis due to inhalation of food and vomit: Secondary | ICD-10-CM

## 2017-02-02 LAB — HEPARIN LEVEL (UNFRACTIONATED)
HEPARIN UNFRACTIONATED: 0.31 [IU]/mL (ref 0.30–0.70)
HEPARIN UNFRACTIONATED: 0.48 [IU]/mL (ref 0.30–0.70)
Heparin Unfractionated: 0.24 IU/mL — ABNORMAL LOW (ref 0.30–0.70)

## 2017-02-02 LAB — CBC
HCT: 31.7 % — ABNORMAL LOW (ref 39.0–52.0)
Hemoglobin: 11.1 g/dL — ABNORMAL LOW (ref 13.0–17.0)
MCH: 31.8 pg (ref 26.0–34.0)
MCHC: 35 g/dL (ref 30.0–36.0)
MCV: 90.8 fL (ref 78.0–100.0)
Platelets: 315 10*3/uL (ref 150–400)
RBC: 3.49 MIL/uL — ABNORMAL LOW (ref 4.22–5.81)
RDW: 14.2 % (ref 11.5–15.5)
WBC: 9.5 10*3/uL (ref 4.0–10.5)

## 2017-02-02 LAB — BASIC METABOLIC PANEL
Anion gap: 11 (ref 5–15)
BUN: 10 mg/dL (ref 6–20)
CHLORIDE: 100 mmol/L — AB (ref 101–111)
CO2: 22 mmol/L (ref 22–32)
Calcium: 7.9 mg/dL — ABNORMAL LOW (ref 8.9–10.3)
Creatinine, Ser: 0.91 mg/dL (ref 0.61–1.24)
GFR calc Af Amer: >60 mL/min (ref >60)
GFR calc non Af Amer: >60 mL/min (ref >60)
GLUCOSE: 112 mg/dL — AB (ref 65–99)
POTASSIUM: 3.4 mmol/L — AB (ref 3.5–5.1)
Sodium: 133 mmol/L — ABNORMAL LOW (ref 135–145)

## 2017-02-02 LAB — GLUCOSE, CAPILLARY
GLUCOSE-CAPILLARY: 107 mg/dL — AB (ref 65–99)
GLUCOSE-CAPILLARY: 106 mg/dL — AB (ref 65–99)
GLUCOSE-CAPILLARY: 113 mg/dL — AB (ref 65–99)
GLUCOSE-CAPILLARY: 125 mg/dL — AB (ref 65–99)
GLUCOSE-CAPILLARY: 107 mg/dL — AB (ref 65–99)
Glucose-Capillary: 103 mg/dL — ABNORMAL HIGH (ref 65–99)

## 2017-02-02 LAB — CULTURE, BLOOD (ROUTINE X 2)
Culture: NO GROWTH
Special Requests: ADEQUATE

## 2017-02-02 LAB — PHOSPHORUS: Phosphorus: 1.6 mg/dL — ABNORMAL LOW (ref 2.5–4.6)

## 2017-02-02 LAB — PROTIME-INR
INR: 1.07
Prothrombin Time: 13.8 seconds (ref 11.4–15.2)

## 2017-02-02 LAB — MAGNESIUM: Magnesium: 1.7 mg/dL (ref 1.7–2.4)

## 2017-02-02 MED ORDER — ONDANSETRON HCL 4 MG/2ML IJ SOLN
4.0000 mg | Freq: Three times a day (TID) | INTRAMUSCULAR | Status: DC | PRN
  Administered 2017-02-03: 4 mg via INTRAVENOUS
  Filled 2017-02-02: qty 2

## 2017-02-02 MED ORDER — DIAZEPAM 2 MG PO TABS
2.0000 mg | ORAL_TABLET | Freq: Two times a day (BID) | ORAL | Status: DC
  Administered 2017-02-02 – 2017-02-09 (×12): 2 mg via ORAL
  Filled 2017-02-02 (×13): qty 1

## 2017-02-02 MED ORDER — HYDRALAZINE HCL 20 MG/ML IJ SOLN
10.0000 mg | INTRAMUSCULAR | Status: DC | PRN
  Administered 2017-02-02: 10 mg via INTRAVENOUS
  Filled 2017-02-02: qty 1

## 2017-02-02 MED ORDER — WARFARIN SODIUM 7.5 MG PO TABS
7.5000 mg | ORAL_TABLET | Freq: Once | ORAL | Status: AC
  Administered 2017-02-02: 7.5 mg via ORAL
  Filled 2017-02-02: qty 1

## 2017-02-02 MED ORDER — FUROSEMIDE 10 MG/ML IJ SOLN
40.0000 mg | Freq: Four times a day (QID) | INTRAMUSCULAR | Status: AC
  Administered 2017-02-02 (×2): 40 mg via INTRAVENOUS
  Filled 2017-02-02 (×2): qty 4

## 2017-02-02 MED ORDER — HYDRALAZINE HCL 20 MG/ML IJ SOLN
10.0000 mg | INTRAMUSCULAR | Status: DC | PRN
  Administered 2017-02-02 – 2017-02-04 (×3): 20 mg via INTRAVENOUS
  Administered 2017-02-05: 40 mg via INTRAVENOUS
  Administered 2017-02-09: 20 mg via INTRAVENOUS
  Filled 2017-02-02 (×5): qty 1

## 2017-02-02 MED ORDER — HYDROCHLOROTHIAZIDE 25 MG PO TABS
25.0000 mg | ORAL_TABLET | Freq: Every day | ORAL | Status: DC
  Administered 2017-02-05 – 2017-02-09 (×5): 25 mg via ORAL
  Filled 2017-02-02 (×6): qty 1

## 2017-02-02 NOTE — Progress Notes (Signed)
Attempted to get pt OOB to chair pre request of MD and family, pt was very weak and this RN and NT together were unable to get pt OOB, We did however assist the pt to sit EOB. pt slouched and was unable to sit up unassisted without support from this RN. PT already consulted and will follow pt. Pt back laying in bed and repositioned to the right side with pillows behind his back. Family at bedside aware. Will monitor.

## 2017-02-02 NOTE — Progress Notes (Signed)
Physical Therapy Treatment Patient Details Name: Zachary Peterson MRN: [id number] DOB: [birth year] Today's Date: 02/02/2017    History of Present Illness 84 y.o. male admitted on 01/26/17 for acute respiratory distress s/p seizure.  CT of head was negative.  Pt was intubated 01/26/17-01/28/17.  Other significant dx: PNA, dysphasia, hypophosphatemia, bacteremia, encepholopathy, HTN and fever. Pt with significant PMH of syncope, seizure, a-fib s/p cardioversion, migraine, HTN, GIB, HOH, COPD, back pain s/p surgery, fatigue and depression.      PT Comments    Returned to assist with helping pt get back to bed from recliner chair as he had potential to be a complicated transfer depending on how much he fatigued after being up a while.  Per RN they got him up as he had a BM while in the chair with better success.  He actually did better, despite fatigue this session, so I am hopeful for short distance gait next session.  I encouraged family to advocate for OOB to chair tomorrow.   Follow Up Recommendations  CIR     Equipment Recommendations  Rolling walker with 5" wheels;Hospital bed    Recommendations for Other Services Rehab consult     Precautions / Restrictions Precautions Precautions: Fall;Other (comment) Precaution Comments: monitor vitals/BP, seizure precautions (per family pt always reports nausea before he has a seizure)    Mobility  Bed Mobility Overal bed mobility: Needs Assistance Bed Mobility: Sit to Supine     Supine to sit: Mod assist     General bed mobility comments: Mod assist to help lift both legs back into the bed when returning to supine.  Pt was able to initiate move, but no strong enough to complete.  Transfers Overall transfer level: Needs assistance Equipment used: Rolling walker (2 wheeled) Transfers: Sit to/from [employer] Sit to Stand: +2 physical assistance;From elevated surface;Mod assist Stand pivot transfers: +2 physical  assistance;From elevated surface;Mod assist       General transfer comment: Two person mod assist using momentum and bed pad to get to standing from low recliner chair.  Pt was able to stand taller and more confidently despite fatigue and take better steps around to the bed.  Assist still needed at trunk and pt still wanted to sit prematurely, but better than before.   Ambulation/Gait             General Gait Details: unable at this time, possibly next session with portable O2 and chair to follow closely.           Balance Overall balance assessment: Needs assistance Sitting-balance support: Feet supported;Bilateral upper extremity supported Sitting balance-Leahy Scale: Fair Sitting balance - Comments: able to be supervision for ~1-2 mins EOB while preparing lines to return to supine.  Postural control: Right lateral lean Standing balance support: Bilateral upper extremity supported Standing balance-Leahy Scale: Poor Standing balance comment: Two person mod assist with RW.                             Cognition Arousal/Alertness: Awake/alert(awakened more with mobility) Behavior During Therapy: WFL for tasks assessed/performed Overall Cognitive Status: Impaired/Different from baseline Area of Impairment: Orientation;Attention;Memory;Following commands;Safety/judgement;Awareness;Problem solving                 Orientation Level: Disoriented to;Time(2002, but likely didn't know the year PTA, can report prez) Current Attention Level: Sustained Memory: Decreased short-term memory(per family has some STM deficits at baseline) Following Commands: Follows one step  commands consistently Safety/Judgement: Decreased awareness of safety;Decreased awareness of deficits Awareness: Intellectual Problem Solving: Slow processing;Decreased initiation;Difficulty sequencing;Requires verbal cues;Requires tactile cues General Comments: Pt is generally weak, slow to process,  and disoriented by long hospitalization.  He is able to follow my one step commands once aroused, but was not really aware of his significant lean in sitting and could not report to me which direction he was leaning.  Slow to process and answer, but when he did was often accurate information.       Exercises Other Exercises Other Exercises: Incentive spirometer x 5 reps.  Pt able to get max 1,000 mL inspired volume.         Pertinent Vitals/Pain Pain Assessment: No/denies pain    Home Living Family/patient expects to be discharged to:: Private residence Living Arrangements: Spouse/significant other Available Help at Discharge: Family;Available 24 hours/day Type of Home: House Home Access: Stairs to enter Entrance Stairs-Rails: Right;Left;Can reach both Home Layout: One level Home Equipment: Cane - single point      Prior Function Level of Independence: Independent      Comments: Pt walks with a cane in the community and usually walks independently in the house.    PT Goals (current goals can now be found in the care plan section) Acute Rehab PT Goals Patient Stated Goal: daughter and wife want him to get stronger so he can come home PT Goal Formulation: With patient/family Time For Goal Achievement: 02/16/17 Potential to Achieve Goals: Good Progress towards PT goals: Progressing toward goals    Frequency    Min 3X/week      PT Plan Current plan remains appropriate       AM-PAC PT "6 Clicks" Daily Activity  Outcome Measure  Difficulty turning over in bed (including adjusting bedclothes, sheets and blankets)?: Unable Difficulty moving from lying on back to sitting on the side of the bed? : Unable Difficulty sitting down on and standing up from a chair with arms (e.g., wheelchair, bedside commode, etc,.)?: Unable Help needed moving to and from a bed to chair (including a wheelchair)?: A Lot Help needed walking in hospital room?: Total Help needed climbing 3-5 steps  with a railing? : Total 6 Click Score: 7    End of Session Equipment Utilized During Treatment: Oxygen(3 L O2 [place]) Activity Tolerance: Patient limited by fatigue Patient left: with family/visitor present;in bed;with call bell/phone within reach;with nursing/sitter in room Nurse Communication: Mobility status PT Visit Diagnosis: Muscle weakness (generalized) (M62.81);Difficulty in walking, not elsewhere classified (R26.2)     Time: [id number] PT Time Calculation (min) (ACUTE ONLY): 28 min  Charges:  $Therapeutic Activity: 23-37 mins          Teodora Baumgarten B. Dima Ferrufino, PT, DPT [phone]            02/02/2017, 5:33 PM

## 2017-02-02 NOTE — Progress Notes (Signed)
[facility]  S: Some trouble breathing this morning, more oxygen needs; was hallucinating this morning   O:  Vitals:   02/02/17 0824 02/02/17 0900 02/02/17 1013 02/02/17 1215  BP: (!) 214/104 (!) 188/86 (!) 175/69 (!) 172/71  Pulse:      Resp: (!) 32 (!) 27 (!) 29 (!) 27  Temp:    (!) 101 F (38.3 C)  TempSrc:    Axillary  SpO2: 92% 92% (!) 89% 93%  Weight:      Height:       6L [place]  General:  Resting comfortably in bed HENT: NCAT OP clear PULM: Crackles R base, no wheezing B, normal effort CV: RRR, no mgr GI: BS+, soft, nontender MSK: normal bulk and tone Derm: edema in upper extremities Neuro: asleep, will wake to voice, follow commands  CXR images reviewed: RLL infiltrate noted  SLP evaluation: likely aspiration, continue dysphagia diet  Impression/plan:  Acute encephalopathy > again a little worse today, complicated picture as this could be toxic metabolic encephalopathy with pneumonia, being hospitalized; agree with family he is at risk for benzo withdrawal given lengthy use of valium at home, but currently has hypoactive delirium > out of bed > lights on during daytime > add back low dose bid valium tonight > avoid prn benzo > frequent orientation > minimize sedating meds other than 1/2 dose home valium  Insomnia: > continue remeron at night  Seizures: continue dilantin  Aspiration: NPO, SLP eval later  HTN: Add HCTZ Continue prn hydralazine and labetalol  Anasarca: > lasix x2 today  Acute respiratory failure due to aspiration pneumonia: > Wean O2 as able for O2 saturation > 90% > out of bed > continue vanc/zosyn > keep NPO for now > monitor closely  COPD: Duoneb, brovana/pulmicort to continue  Family updated bedside by me  Maintain in SDU  Transfer to SDU  > 35 minutes spent on this patient, > 50% face to face  Heber CarolinaBrent Zymir Napoli, MD [facility] Pager: [phone] Cell: [phone] After 3pm or if no response, call [phone]

## 2017-02-02 NOTE — Progress Notes (Signed)
ANTICOAGULATION CONSULT NOTE   Pharmacy Consult for Heparin Indication: atrial fibrillation  Allergies  Allergen Reactions  . Aspirin Other (See Comments)    CAUSES ULCERS  . Codeine Nausea And Vomiting  . Oxycodone Nausea And Vomiting   Patient Measurements: Height: 5\' 7"  (170.2 cm) Weight: 163 lb 9.3 oz (74.2 kg) IBW/kg (Calculated) : 66.1 Heparin dosing weight- 67 kg  Vital Signs: Temp: 99.9 F (37.7 C) (01/26 1959) Temp Source: Axillary (01/26 1959) BP: 129/57 (01/26 1959) Pulse Rate: 94 (01/26 1959)  Labs: Recent Labs    01/31/17 0726  02/01/17 0251  02/02/17 0215 02/02/17 0624 02/02/17 2242  HGB 12.8*  --  10.5*  --   --  11.1*  --   HCT 37.5*  --  30.9*  --   --  31.7*  --   PLT 256  --  253  --   --  315  --   LABPROT 14.8  --  14.2  --   --  13.8  --   INR 1.16  --  1.11  --   --  1.07  --   HEPARINUNFRC <0.10*   < >  --    < > 0.31 0.24* 0.48  CREATININE 1.11  --  1.13  --   --  0.91  --    < > = values in this interval not displayed.   Estimated Creatinine Clearance: 57.5 mL/min (by C-G formula based on SCr of 0.91 mg/dL).  Medical History: Past Medical History:  Diagnosis Date  . Back pain   . COPD (chronic obstructive pulmonary disease) (HCC)   . Depression   . Fatigue    a. in setting of Afib, improved following DCCV.  [name]. Hearing loss   . History of GI bleed 1976  . Hyperlipemia   . Hypertension   . Migraine   . Palpitations   . Persistent atrial fibrillation (HCC)    a. 02/2016 Echo: EF 66-65%, mild MR;  b. CHA2DS2VASc = 3-->coumadin;  c. 05/2016 TEE/DCCV: EF 45-50%, triv AI, mild to mod MR, no LA/LAA/RA/RAA thrombus, mod to sev TR-->successful DCCV.  . Seizure (HCC)   . Syncope    a. related to seizure disorder   Assessment: 84 y/o M on warfarin PTA for afib, presents to the ED with seizures, [facility] on [facility]/19 for same issue, required intubation 1/20-1/21.  Pharmacy was consulted at admission to hold warfarin and start heparin  when INR is <2. INR on admit was 2.02 and had been trending up every day though no coumadin was given. INR was reversed with vitamin K but now warfarin resumed.   Heparin level is therapeutic.   Home warfarin dose: 5mg  po daily   Goal of Therapy:  Heparin level 0.3-0.7 units/ml Monitor platelets by anticoagulation protocol: Yes   Plan:  -Continue heparin at 1650 units/hr -Daily HL, CBC   Baldemar FridayMasters, Shyann Hefner M  02/02/2017 11:20 PM

## 2017-02-02 NOTE — Progress Notes (Signed)
pts family expressing concerns that patient may be hallucinating stating that he told his wife her hair was purple, and that he was seeing seeing snails on the ceiling, pt also has slight tremor on the right side only. Family is concerned that this is due to him not taking his home dose of valium which they tell me he has been taking for many years. Will make primary team aware of their concerns and continue to monitor.

## 2017-02-02 NOTE — Progress Notes (Signed)
ANTICOAGULATION CONSULT NOTE - Follow Up Consult  Pharmacy Consult for heparin Indication: atrial fibrillation  Labs: Recent Labs    01/30/17 1905 01/31/17 0726  02/01/17 0251 02/01/17 0558 02/01/17 1552 02/02/17 0215  HGB  --  12.8*  --  10.5*  --   --   --   HCT  --  37.5*  --  30.9*  --   --   --   PLT  --  256  --  253  --   --   --   LABPROT 20.4* 14.8  --  14.2  --   --   --   INR 1.76 1.16  --  1.11  --   --   --   HEPARINUNFRC  --  <0.10*   < >  --  <0.10* 0.14* 0.31  CREATININE  --  1.11  --  1.13  --   --   --    < > = values in this interval not displayed.    Assessment/Plan:  84yo male therapeutic on heparin after rate changes. Will continue gtt at current rate and confirm stable with additional level.   Vernard GamblesVeronda Raiana Pharris, PharmD, BCPS  02/02/2017,3:03 AM

## 2017-02-02 NOTE — Evaluation (Signed)
Physical Therapy Evaluation Patient Details Name: Zachary Peterson MRN: [id number] DOB: 03/10/1931 Today's Date: 02/02/2017   History of Present Illness  84 y.o. male admitted on 01/26/17 for acute respiratory distress s/p seizure.  CT of head was negative.  Pt was intubated 01/26/17-01/28/17.  Other significant dx: PNA, dysphasia, hypophosphatemia, bacteremia, encepholopathy, HTN and fever. Pt with significant PMH of syncope, seizure, a-fib s/p cardioversion, migraine, HTN, GIB, HOH, COPD, back pain s/p surgery, fatigue and depression.    Clinical Impression  Pt is very weak and deconditioned far from his baseline of independent at home and gait with a cane in the community.  He seems to have some mild baseline memory issues at home (daughter does his pills, he got lost driving to a familiar place once recently and decided driving was not for him anymore), but he is not back to baseline mentation yet.  He was able to stand with two person assist and RW to get OOB to the chair. He will likely need extensive post acute rehab to help him return to his PLOF so I have asked for an inpatient rehab screen.   PT to follow acutely for deficits listed below.       Follow Up Recommendations CIR    Equipment Recommendations  Rolling walker with 5" wheels;Hospital bed    Recommendations for Other Services Rehab consult     Precautions / Restrictions Precautions Precautions: Fall;Other (comment) Precaution Comments: monitor vitals/BP      Mobility  Bed Mobility Overal bed mobility: Needs Assistance Bed Mobility: Supine to Sit     Supine to sit: Mod assist;HOB elevated     General bed mobility comments: Mod assist to support trunk and help progress legs over EOB.  Hand over hand assist to help pt find the rail so he could help pull himself up to EOB.    Transfers Overall transfer level: Needs assistance Equipment used: Rolling walker (2 wheeled) Transfers: Sit to/from [employer] Sit to Stand: +2 physical assistance;From elevated surface;Mod assist Stand pivot transfers: +2 physical assistance;Max assist;From elevated surface       General transfer comment: Two person mod to max assist to support trunk over weak/shakey knees to stand at EOB.  Pt able to march in place, so we took some very weak pivotal steps to the chair and "plopped" down to the low seat with uncontrolled descent and therapist moving his hips around to ensure we make it to the sitting surface.  Daugther was providing second set of hands.   Ambulation/Gait             General Gait Details: unable at this time.          Balance Overall balance assessment: Needs assistance Sitting-balance support: Feet supported;Bilateral upper extremity supported Sitting balance-Leahy Scale: Poor Sitting balance - Comments: Max assist initially EOB and then after squaring up and sitting for a minute working on midline and upright posture pt was able to be min assist seated EOB.  Right lateral lean in sitting.  Postural control: Right lateral lean Standing balance support: Bilateral upper extremity supported Standing balance-Leahy Scale: Poor Standing balance comment: two person mod to max assist with RW.                              Pertinent Vitals/Pain Pain Assessment: No/denies pain    Home Living Family/patient expects to be discharged to:: Private residence Living Arrangements: Spouse/significant other  Available Help at Discharge: Family;Available 24 hours/day Type of Home: House Home Access: Stairs to enter Entrance Stairs-Rails: Right;Left;Can reach both Entrance Stairs-Number of Steps: 4 Home Layout: One level Home Equipment: Cane - single point      Prior Function Level of Independence: Independent         Comments: Pt walks with a cane in the community and usually walks independently in the house.      Hand Dominance   Dominant Hand: Right     Extremity/Trunk Assessment   Upper Extremity Assessment Upper Extremity Assessment: Generalized weakness(left is weaker than right. )    Lower Extremity Assessment Lower Extremity Assessment: Generalized weakness(3-/5 grossly per bed level assessment, bil LE edema L>R)    Cervical / Trunk Assessment Cervical / Trunk Assessment: Kyphotic  Communication   Communication: HOH  Cognition Arousal/Alertness: Lethargic(awakened more with mobility) Behavior During Therapy: WFL for tasks assessed/performed Overall Cognitive Status: Impaired/Different from baseline Area of Impairment: Orientation;Attention;Memory;Following commands;Safety/judgement;Awareness;Problem solving                 Orientation Level: Disoriented to;Time Current Attention Level: Sustained Memory: Decreased short-term memory(per family has some STM deficits at baseline) Following Commands: Follows one step commands consistently Safety/Judgement: Decreased awareness of safety;Decreased awareness of deficits Awareness: Intellectual Problem Solving: Slow processing;Decreased initiation;Difficulty sequencing;Requires verbal cues;Requires tactile cues General Comments: Pt is generally weak, slow to process, and disoriented by long hospitalization.  He is able to follow my one step commands once aroused, but was not really aware of his significant lean in sitting and could not report to me which direction he was leaning.  Slow to process and answer, but when he did was often accurate information.          Exercises Other Exercises Other Exercises: Incentive spirometer x 5 reps.  Pt able to get max 1,000 mL inspired volume.    Assessment/Plan    PT Assessment Patient needs continued PT services  PT Problem List Decreased strength;Decreased activity tolerance;Decreased balance;Decreased mobility;Decreased cognition;Decreased knowledge of use of DME;Decreased knowledge of precautions;Cardiopulmonary status limiting  activity       PT Treatment Interventions DME instruction;Gait training;Stair training;Functional mobility training;Therapeutic activities;Therapeutic exercise;Balance training;Patient/family education    PT Goals (Current goals can be found in the Care Plan section)  Acute Rehab PT Goals Patient Stated Goal: daughter and wife want him to get stronger so he can come home PT Goal Formulation: With patient/family Time For Goal Achievement: 02/16/17 Potential to Achieve Goals: Good    Frequency Min 3X/week           AM-PAC PT "6 Clicks" Daily Activity  Outcome Measure Difficulty turning over in bed (including adjusting bedclothes, sheets and blankets)?: Unable Difficulty moving from lying on back to sitting on the side of the bed? : Unable Difficulty sitting down on and standing up from a chair with arms (e.g., wheelchair, bedside commode, etc,.)?: Unable Help needed moving to and from a bed to chair (including a wheelchair)?: A Lot Help needed walking in hospital room?: Total Help needed climbing 3-5 steps with a railing? : Total 6 Click Score: 7    End of Session Equipment Utilized During Treatment: Oxygen(3 L O2 [place]) Activity Tolerance: Patient limited by fatigue Patient left: in chair;with call bell/phone within reach;with family/visitor present Nurse Communication: Mobility status;Need for lift equipment;Other (comment)(steady would be good and if not maxi move) PT Visit Diagnosis: Muscle weakness (generalized) (M62.81);Difficulty in walking, not elsewhere classified (R26.2)    Time: 8295-62131548-1631 PT  Time Calculation (min) (ACUTE ONLY): 43 min   Charges:          Lurena Joiner B. Sedonia Kitner, PT, DPT [phone]    PT Evaluation $PT Eval Moderate Complexity: 1 Mod PT Treatments $Therapeutic Activity: 23-37 mins   02/02/2017, 4:57 PM

## 2017-02-02 NOTE — Progress Notes (Signed)
MD was notified about elevated SBP 160-182 bisoprolol restarted and IV labetalol PRN given still not well controlled MD updated and and I will give and continue to monitor.

## 2017-02-02 NOTE — Progress Notes (Signed)
  Speech Language Pathology Treatment: Dysphagia  Patient Details Name: Zachary RavelSamuel D Peterson MRN: [id number] DOB: 1931/06/11 Today's Date: 02/02/2017 Time: [id number] SLP Time Calculation (min) (ACUTE ONLY): 15 min  Assessment / Plan / Recommendation Clinical Impression  Session consisted of education and discussion of patient's dysphagia with patient and spouse. When clinician asked patient how he was doing he said, "Not good...they say I have pneumonia". Patient's RN reported that he has been having difficulty with breathing this morning and he is very fatigued and so he will be made NPO temporarily. Patient and spouse both understanding of reason for not having any PO's at this time. We discussed plan to continue PO's and when patient's respiratory status has improved and he is able to more safely eat and drink.     HPI HPI: 84 y.o. male with known seizure disorder admitted through ED in respiratory distress following seizure requiring intubation. ETT 1/19-21.       SLP Plan   Continue with current plan of care.        Recommendations    Continue with current diet(Dys 1, Honey liquids) when respiratory status improves.                   Angela NevinJohn T. Preston, MA, CCC-SLP 02/02/17 5:06 PM

## 2017-02-02 NOTE — Progress Notes (Signed)
PT Cancellation Note  Patient Details Name: Zachary RavelSamuel D Peterson MRN: [id number] DOB: [birth year]   Cancelled Treatment:    Reason Eval/Treat Not Completed: Patient not medically ready. BP still very high this AM (SBP 188-200's at 8:00-9:00). PT will continue to f/u with pt as available and appropriate for full participation in PT evaluation.    Alessandra BevelsJennifer M Adden Strout 02/02/2017, 9:18 AM

## 2017-02-02 NOTE — Progress Notes (Addendum)
ANTICOAGULATION CONSULT NOTE   Pharmacy Consult for Heparin, warfarin Indication: atrial fibrillation  Allergies  Allergen Reactions  . Aspirin Other (See Comments)    CAUSES ULCERS  . Codeine Nausea And Vomiting  . Oxycodone Nausea And Vomiting   Patient Measurements: Height: 5\' 7"  (170.2 cm) Weight: 163 lb 9.3 oz (74.2 kg) IBW/kg (Calculated) : 66.1 Heparin dosing weight- 67 kg  Vital Signs: Temp: 98.7 F (37.1 C) (01/26 0805) Temp Source: Oral (01/26 0805) BP: 188/86 (01/26 0900) Pulse Rate: 100 (01/26 0727)  Labs: Recent Labs    01/31/17 0726  02/01/17 0251  02/01/17 1552 02/02/17 0215 02/02/17 0624  HGB 12.8*  --  10.5*  --   --   --  11.1*  HCT 37.5*  --  30.9*  --   --   --  31.7*  PLT 256  --  253  --   --   --  315  LABPROT 14.8  --  14.2  --   --   --  13.8  INR 1.16  --  1.11  --   --   --  1.07  HEPARINUNFRC <0.10*   < >  --    < > 0.14* 0.31 0.24*  CREATININE 1.11  --  1.13  --   --   --  0.91   < > = values in this interval not displayed.   Estimated Creatinine Clearance: 57.5 mL/min (by C-G formula based on SCr of 0.91 mg/dL).  Medical History: Past Medical History:  Diagnosis Date  . Back pain   . COPD (chronic obstructive pulmonary disease) (HCC)   . Depression   . Fatigue    a. in setting of Afib, improved following DCCV.  [name]. Hearing loss   . History of GI bleed 1976  . Hyperlipemia   . Hypertension   . Migraine   . Palpitations   . Persistent atrial fibrillation (HCC)    a. 02/2016 Echo: EF 66-65%, mild MR;  b. CHA2DS2VASc = 3-->coumadin;  c. 05/2016 TEE/DCCV: EF 45-50%, triv AI, mild to mod MR, no LA/LAA/RA/RAA thrombus, mod to sev TR-->successful DCCV.  . Seizure (HCC)   . Syncope    a. related to seizure disorder   Assessment: 84 y/o M on warfarin PTA for afib, presents to the ED with seizures, [facility]/19 for same issue, required intubation 1/20-1/21.  Pharmacy was consulted at admission to hold warfarin and start  heparin when INR is <2. INR on admit was 2.02 and had been trending up every day though no coumadin was given. INR was reversed with vitamin K but now warfarin resumed.   Heparin level subtherapeutic this morning, INR remains low at 1.07. CBC stable, no issues with infusion per RN.   Home warfarin dose: 5mg  po daily   Goal of Therapy:  Heparin level 0.3-0.7 units/ml Monitor platelets by anticoagulation protocol: Yes   Plan:  -Increase heparin to 1650 units/hr -Recheck in 8 hours -Warfarin 7.5mg  PO x1 again tonight -Daily PT/INR  Fredonia HighlandMichael Wilma Michaelson, PharmD, BCPS PGY-2 Cardiology Pharmacy Resident Pager: [phone] 02/02/2017

## 2017-02-03 ENCOUNTER — Inpatient Hospital Stay (HOSPITAL_COMMUNITY): Payer: Medicare HMO

## 2017-02-03 LAB — GLUCOSE, CAPILLARY
GLUCOSE-CAPILLARY: 108 mg/dL — AB (ref 65–99)
GLUCOSE-CAPILLARY: 93 mg/dL (ref 65–99)
GLUCOSE-CAPILLARY: 126 mg/dL — AB (ref 65–99)
Glucose-Capillary: 109 mg/dL — ABNORMAL HIGH (ref 65–99)
Glucose-Capillary: 116 mg/dL — ABNORMAL HIGH (ref 65–99)
Glucose-Capillary: 157 mg/dL — ABNORMAL HIGH (ref 65–99)

## 2017-02-03 LAB — CBC
HCT: 30.3 % — ABNORMAL LOW (ref 39.0–52.0)
Hemoglobin: 10.7 g/dL — ABNORMAL LOW (ref 13.0–17.0)
MCH: 32.4 pg (ref 26.0–34.0)
MCHC: 35.3 g/dL (ref 30.0–36.0)
MCV: 91.8 fL (ref 78.0–100.0)
PLATELETS: 331 10*3/uL (ref 150–400)
RBC: 3.3 MIL/uL — ABNORMAL LOW (ref 4.22–5.81)
RDW: 14.3 % (ref 11.5–15.5)
WBC: 11.2 10*3/uL — AB (ref 4.0–10.5)

## 2017-02-03 LAB — HEPARIN LEVEL (UNFRACTIONATED): Heparin Unfractionated: 0.36 IU/mL (ref 0.30–0.70)

## 2017-02-03 LAB — PROTIME-INR
INR: 1.15
Prothrombin Time: 14.6 seconds (ref 11.4–15.2)

## 2017-02-03 MED ORDER — WARFARIN SODIUM 7.5 MG PO TABS
7.5000 mg | ORAL_TABLET | Freq: Once | ORAL | Status: AC
  Administered 2017-02-03: 7.5 mg via ORAL
  Filled 2017-02-03: qty 1

## 2017-02-03 NOTE — Progress Notes (Addendum)
ANTICOAGULATION CONSULT NOTE   Pharmacy Consult for Heparin/Coumadin Indication: atrial fibrillation  Allergies  Allergen Reactions  . Aspirin Other (See Comments)    CAUSES ULCERS  . Codeine Nausea And Vomiting  . Oxycodone Nausea And Vomiting   Patient Measurements: Height: 5\' 7"  (170.2 cm) Weight: 163 lb 9.3 oz (74.2 kg) IBW/kg (Calculated) : 66.1 Heparin dosing weight- 67 kg  Vital Signs: Temp: 98.7 F (37.1 C) (01/27 0413) Temp Source: Oral (01/27 0413) Pulse Rate: 95 (01/27 0743)  Labs: Recent Labs    02/01/17 0251  02/02/17 0624 02/02/17 2242 02/03/17 0502  HGB 10.5*  --  11.1*  --  10.7*  HCT 30.9*  --  31.7*  --  30.3*  PLT 253  --  315  --  331  LABPROT 14.2  --  13.8  --  14.6  INR 1.11  --  1.07  --  1.15  HEPARINUNFRC  --    < > 0.24* 0.48 0.36  CREATININE 1.13  --  0.91  --   --    < > = values in this interval not displayed.   Estimated Creatinine Clearance: 57.5 mL/min (by C-G formula based on SCr of 0.91 mg/dL).  Medical History: Past Medical History:  Diagnosis Date  . Back pain   . COPD (chronic obstructive pulmonary disease) (HCC)   . Depression   . Fatigue    a. in setting of Afib, improved following DCCV.  [name]. Hearing loss   . History of GI bleed 1976  . Hyperlipemia   . Hypertension   . Migraine   . Palpitations   . Persistent atrial fibrillation (HCC)    a. 02/2016 Echo: EF 66-65%, mild MR;  b. CHA2DS2VASc = 3-->coumadin;  c. 05/2016 TEE/DCCV: EF 45-50%, triv AI, mild to mod MR, no LA/LAA/RA/RAA thrombus, mod to sev TR-->successful DCCV.  . Seizure (HCC)   . Syncope    a. related to seizure disorder   Assessment: 84 y/o M on warfarin PTA for afib, presents to the ED with seizures, [facility] for same issue, required intubation 1/20-1/21.  Pharmacy was consulted at admission to hold warfarin and heparin was started after INR < 2.  Coumadin resumed 1/25, heparin continues until INR back over 2.  Heparin level is  therapeutic.     Home warfarin dose: 5mg  po daily   Goal of Therapy:  Heparin level 0.3-0.7 units/ml  INR 2-3 Monitor platelets by anticoagulation protocol: Yes   Plan:  -Continue heparin at 1650 units/hr -Coumadin 7.5 mg po x 1 again tonight. -Daily heparin level, CBC, INR.  Tad MooreJessica Jahkeem Kurka, Pharm D, BCPS  Clinical Pharmacist Pager [phone]  02/03/2017 9:09 AM

## 2017-02-03 NOTE — Progress Notes (Signed)
PROGRESS NOTE [facility]   Zachary RavelSamuel D Dorvil   ZOX:[id number] DOB: 02/09/1931  DOA: 01/26/2017 PCP: Ignacia PalmaBeck, Mark C., MD   Brief Narrative:  Zachary Peterson is an 84 year old male with known seizure disorder, COPD, hypertension, A. fib, and history of GI bleed presented to the emergency department with acute respiratory distress after sustaining a seizure.  After EMS was called patient had another witnessed tonic-clonic seizure, he was intubated for airway protection and he was admitted to the ICU.  Neurology was consulted and patient was loaded with Keppra. Patient was successfully extubated on 1/21, fail swallow eval when for modified barium and recommended mechanical soft diet.    Subjective: Patient seen and examined, continues to do well. He report feeling weak. No acute events overnight.   Assessment & Plan: Acute metabolic encephalopathy - improved  Multifactorial - seizures, Pneumonia, hypoxia, hospital delirium  Treat underlying causes Avoid sedating medications  Lights on during daytime  Out bed as tolerated  PT recommending CIR   Acute respiratory failure with hypoxia due to aspiration PNA  Wean off O2 as tolerated keep O2 sats between 89-92%  Continue Zosyn, patient was on Vancomycin due to ? Bacteremia but only 1/2 bottle grew coagulase negative, likely to be contaminant. Will d/c Vancomycin  Repeated blood cultures no growth so far Felt that fluid overload also contributing   Seizures  On Dilantin and stable, no seizure episode  Patient on Valium as well  Continue current treatment   Afib  HR well controlled, not on rate control medication  Continue heparin to bridge with warfarin  INR goal 2-3  COPD - stable Continue nebulizer No wheezing   HTN  BP stable  On HCTZ and Lasix   Dysphagia  Soft diet  Esophagram   DVT prophylaxis: Heparin ggt/Warfarin  Code Status: Full Code  Family Communication: None at bedside  Disposition Plan: CIR     Consultants:   PCCM   Neurology   Procedures:     Antimicrobials: Anti-infectives (From admission, onward)   Start     Dose/Rate Route Frequency Ordered Stop   02/01/17 1400  vancomycin (VANCOCIN) IVPB 750 mg/150 ml premix     750 mg 150 mL/hr over 60 Minutes Intravenous Every 12 hours 02/01/17 1251     01/30/17 0000  vancomycin (VANCOCIN) 500 mg in sodium chloride 0.9 % 100 mL IVPB  Status:  Discontinued     500 mg 100 mL/hr over 60 Minutes Intravenous Every 12 hours 01/29/17 1049 02/01/17 0912   01/29/17 1200  vancomycin (VANCOCIN) 1,250 mg in sodium chloride 0.9 % 250 mL IVPB     1,250 mg 166.7 mL/hr over 90 Minutes Intravenous  Once 01/29/17 1049 01/29/17 1328   01/27/17 1045  piperacillin-tazobactam (ZOSYN) IVPB 3.375 g     3.375 g 12.5 mL/hr over 240 Minutes Intravenous Every 8 hours 01/27/17 1033        Objective: Vitals:   02/03/17 0413 02/03/17 0743 02/03/17 0746 02/03/17 0749  BP:      Pulse:  95    Resp:  20    Temp: 98.7 F (37.1 C)     TempSrc: Oral     SpO2:  90% 90% 92%  Weight:      Height:        Intake/Output Summary (Last 24 hours) at 02/03/2017 0832 Last data filed at 02/03/2017 0748 Gross per 24 hour  Intake 1702.18 ml  Output 3325 ml  Net -1622.82 ml   [employer]  01/31/17 0457 02/01/17 0415 02/02/17 0612  Weight: 67 kg (147 lb 11.3 oz) 72 kg (158 lb 11.7 oz) 74.2 kg (163 lb 9.3 oz)    Examination:  General exam: NAD  HEENT: OP moist and clear Respiratory system: Good air entry, No crackles or wheezing.  Cardiovascular system: S1S2 systolic murmur  Gastrointestinal system: Abdomen is nondistended, soft and nontender.  Central nervous system: AAOx3, non focal findings  Extremities: Trace edema b/l Skin: No rashes Psychiatry: Mood & affect appropriate.    Data Reviewed: I have personally reviewed following labs and imaging studies  CBC: Recent Labs  Lab 01/28/17 0741 01/30/17 0255 01/31/17 0726 02/01/17 0251  02/02/17 0624 02/03/17 0502  WBC 10.7* 10.8* 11.1* 8.1 9.5 11.2*  NEUTROABS 9.1*  --   --  6.2  --   --   HGB 13.9 12.5* 12.8* 10.5* 11.1* 10.7*  HCT 40.5 37.0* 37.5* 30.9* 31.7* 30.3*  MCV 95.1 94.9 92.4 92.0 90.8 91.8  PLT 190 236 256 253 315 331   Basic Metabolic Panel: Recent Labs  Lab 01/29/17 0450 01/30/17 0255 01/30/17 1035 01/30/17 1905 01/31/17 0726 01/31/17 1919 01/31/17 2259 02/01/17 0251 02/01/17 1552 02/02/17 0624  NA 139 137  --   --  134*  --   --  135  --  133*  K 4.0 4.4  --   --  3.7  --   --  3.5  --  3.4*  CL 109 107  --   --  99*  --   --  101  --  100*  CO2 19* 14*  --   --  22  --   --  23  --  22  GLUCOSE 98 91  --   --  155*  --   --  133*  --  112*  BUN 12 13  --   --  11  --   --  13  --  10  CREATININE 1.14 1.15  --   --  1.11  --   --  1.13  --  0.91  CALCIUM 8.0* 8.1*  --   --  8.1*  --   --  7.7*  --  7.9*  MG  --  1.7 1.9 1.8 1.8 1.7  --   --   --  1.7  PHOS  --  1.7* 2.1* 1.5* 1.1* <1.0* <1.0*  --  1.5* 1.6*   GFR: Estimated Creatinine Clearance: 57.5 mL/min (by C-G formula based on SCr of 0.91 mg/dL). Liver Function Tests: Recent Labs  Lab 01/29/17 0450 01/30/17 1035 01/31/17 0726  AST 27 21 19   ALT 12* 11* 10*  ALKPHOS 82 77 74  BILITOT 1.4* 1.5* 1.6*  PROT 6.3* 6.5 6.3*  ALBUMIN 2.6* 2.4* 2.3*   No results for input(s): LIPASE, AMYLASE in the last 168 hours. Recent Labs  Lab 02/01/17 0251  AMMONIA 34   Coagulation Profile: Recent Labs  Lab 01/30/17 1905 01/31/17 0726 02/01/17 0251 02/02/17 0624 02/03/17 0502  INR 1.76 1.16 1.11 1.07 1.15   Cardiac Enzymes: No results for input(s): CKTOTAL, CKMB, CKMBINDEX, TROPONINI in the last 168 hours. BNP (last 3 results) No results for input(s): PROBNP in the last 8760 hours. HbA1C: No results for input(s): HGBA1C in the last 72 hours. CBG: Recent Labs  Lab 02/02/17 1658 02/02/17 1954 02/02/17 2353 02/03/17 0412 02/03/17 0744  GLUCAP 125* 107* 103* 108* 93    Lipid Profile: No results for input(s): CHOL, HDL, LDLCALC, TRIG, CHOLHDL,  LDLDIRECT in the last 72 hours. Thyroid Function Tests: Recent Labs    02/01/17 0251  TSH 1.239   Anemia Panel: No results for input(s): VITAMINB12, FOLATE, FERRITIN, TIBC, IRON, RETICCTPCT in the last 72 hours. Sepsis Labs: Recent Labs  Lab 01/27/17 0836 01/27/17 0900 01/27/17 1258 01/30/17 1034 01/30/17 1035 01/30/17 1400 01/31/17 0726 02/01/17 0251  PROCALCITON  --  1.93  --   --  2.39  --  2.31 2.02  LATICACIDVEN 5.2*  --  4.6* 1.3  --  1.3  --   --     Recent Results (from the past 240 hour(s))  Culture, respiratory (NON-Expectorated)     Status: None   Collection Time: 01/27/17 12:12 AM  Result Value Ref Range Status   Specimen Description TRACHEAL ASPIRATE  Final   Special Requests NONE  Final   Gram Stain   Final    FEW WBC PRESENT, PREDOMINANTLY PMN NO SQUAMOUS EPITHELIAL CELLS SEEN FEW GRAM POSITIVE COCCI IN PAIRS RARE GRAM POSITIVE RODS    Culture Consistent with normal respiratory flora.  Final   Report Status 01/29/2017 FINAL  Final  MRSA PCR Screening     Status: None   Collection Time: 01/27/17  1:56 AM  Result Value Ref Range Status   MRSA by PCR NEGATIVE NEGATIVE Final    Comment:        The GeneXpert MRSA Assay (FDA approved for NASAL specimens only), is one component of a comprehensive MRSA colonization surveillance program. It is not intended to diagnose MRSA infection nor to guide or monitor treatment for MRSA infections.   Culture, respiratory (NON-Expectorated)     Status: None   Collection Time: 01/28/17  7:08 AM  Result Value Ref Range Status   Specimen Description TRACHEAL ASPIRATE  Final   Special Requests Normal  Final   Gram Stain NO WBC SEEN RARE YEAST   Final   Culture FEW CANDIDA TROPICALIS  Final   Report Status 01/30/2017 FINAL  Final  Culture, blood (routine x 2)     Status: Abnormal   Collection Time: 01/28/17  7:35 AM  Result Value Ref  Range Status   Specimen Description BLOOD LEFT HAND  Final   Special Requests IN PEDIATRIC BOTTLE Blood Culture adequate volume  Final   Culture  Setup Time   Final    GRAM POSITIVE COCCI IN CLUSTERS IN PEDIATRIC BOTTLE Organism ID to follow    Culture (A)  Final    STAPHYLOCOCCUS SPECIES (COAGULASE NEGATIVE) THE SIGNIFICANCE OF ISOLATING THIS ORGANISM FROM A SINGLE SET OF BLOOD CULTURES WHEN MULTIPLE SETS ARE DRAWN IS UNCERTAIN. PLEASE NOTIFY THE MICROBIOLOGY DEPARTMENT WITHIN ONE WEEK IF SPECIATION AND SENSITIVITIES ARE REQUIRED.    Report Status 01/31/2017 FINAL  Final  Blood Culture ID Panel (Reflexed)     Status: Abnormal   Collection Time: 01/28/17  7:35 AM  Result Value Ref Range Status   Enterococcus species NOT DETECTED NOT DETECTED Final   Listeria monocytogenes NOT DETECTED NOT DETECTED Final   Staphylococcus species DETECTED (A) NOT DETECTED Final    Comment: Methicillin (oxacillin) susceptible coagulase negative staphylococcus. Possible blood culture contaminant (unless isolated from more than one blood culture draw or clinical case suggests pathogenicity). No antibiotic treatment is indicated for blood  culture contaminants. CRITICAL RESULT CALLED TO, READ BACK BY AND VERIFIED WITH: N. Batchelder Pharm.D. 10:50 01/29/17 ([email or web address])    Staphylococcus aureus NOT DETECTED NOT DETECTED Final   Methicillin resistance NOT DETECTED NOT DETECTED Final   Streptococcus  species NOT DETECTED NOT DETECTED Final   Streptococcus agalactiae NOT DETECTED NOT DETECTED Final   Streptococcus pneumoniae NOT DETECTED NOT DETECTED Final   Streptococcus pyogenes NOT DETECTED NOT DETECTED Final   Acinetobacter baumannii NOT DETECTED NOT DETECTED Final   Enterobacteriaceae species NOT DETECTED NOT DETECTED Final   Enterobacter cloacae complex NOT DETECTED NOT DETECTED Final   Escherichia coli NOT DETECTED NOT DETECTED Final   Klebsiella oxytoca NOT DETECTED NOT DETECTED Final   Klebsiella  pneumoniae NOT DETECTED NOT DETECTED Final   Proteus species NOT DETECTED NOT DETECTED Final   Serratia marcescens NOT DETECTED NOT DETECTED Final   Carbapenem resistance NOT DETECTED NOT DETECTED Final   Haemophilus influenzae NOT DETECTED NOT DETECTED Final   Neisseria meningitidis NOT DETECTED NOT DETECTED Final   Pseudomonas aeruginosa NOT DETECTED NOT DETECTED Final   Candida albicans NOT DETECTED NOT DETECTED Final   Candida glabrata NOT DETECTED NOT DETECTED Final   Candida krusei NOT DETECTED NOT DETECTED Final   Candida parapsilosis NOT DETECTED NOT DETECTED Final   Candida tropicalis NOT DETECTED NOT DETECTED Final  Culture, blood (routine x 2)     Status: None   Collection Time: 01/28/17  7:45 AM  Result Value Ref Range Status   Specimen Description BLOOD LEFT ANTECUBITAL  Final   Special Requests IN PEDIATRIC BOTTLE Blood Culture adequate volume  Final   Culture NO GROWTH 5 DAYS  Final   Report Status 02/02/2017 FINAL  Final  Culture, blood (Routine X 2) w Reflex to ID Panel     Status: None (Preliminary result)   Collection Time: 02/01/17 11:05 AM  Result Value Ref Range Status   Specimen Description BLOOD LEFT HAND  Final   Special Requests IN PEDIATRIC BOTTLE Blood Culture adequate volume  Final   Culture NO GROWTH 1 DAY  Final   Report Status PENDING  Incomplete  Culture, blood (Routine X 2) w Reflex to ID Panel     Status: None (Preliminary result)   Collection Time: 02/01/17 11:12 AM  Result Value Ref Range Status   Specimen Description BLOOD RIGHT HAND  Final   Special Requests IN PEDIATRIC BOTTLE Blood Culture adequate volume  Final   Culture NO GROWTH 1 DAY  Final   Report Status PENDING  Incomplete      Radiology Studies: Dg Chest Port 1 View  Result Date: 02/02/2017 CLINICAL DATA:  Shortness of breath EXAM: PORTABLE CHEST 1 VIEW COMPARISON:  February 01, 2017 FINDINGS: The cardiomediastinal silhouette is stable. No pneumothorax. Patchy opacity in left base  is stable. Increased infiltrate in the right base. No other interval changes. IMPRESSION: Increasing infiltrate in the right base. Stable patchy opacity in the left base. Electronically Signed   By: Gerome Sam III M.D   On: 02/02/2017 07:48      Scheduled Meds: . arformoterol  15 mcg Nebulization BID  . bisoprolol  2.5 mg Oral BID  . budesonide (PULMICORT) nebulizer solution  0.5 mg Nebulization BID  . diazepam  2 mg Oral Q12H  . hydrochlorothiazide  25 mg Oral Daily  . mouth rinse  15 mL Mouth Rinse BID  . mirtazapine  15 mg Oral QHS  . pantoprazole (PROTONIX) IV  40 mg Intravenous Daily  . phenytoin (DILANTIN) IV  100 mg Intravenous TID  . Warfarin - Pharmacist Dosing Inpatient   Does not apply q1800   Continuous Infusions: . sodium chloride Stopped (01/29/17 0800)  . heparin 1,650 Units/hr (02/02/17 2210)  . lactated  ringers 75 mL/hr at 02/02/17 2210  . piperacillin-tazobactam 3.375 g (02/03/17 0618)  . vancomycin Stopped (02/03/17 0322)     LOS: 7 days   Time spent: Total of 25 minutes spent with pt, greater than 50% of which was spent in discussion of  treatment, counseling and coordination of care   Latrelle Dodrill, MD Pager: Text Page via [email or web address]   If 7PM-7AM, please contact night-coverage [email or web address] 02/03/2017, 8:32 AM

## 2017-02-03 NOTE — Progress Notes (Signed)
Modified Barium Swallow Progress Note  Patient Details  Name: Zachary Peterson MRN: [id number] Date of Birth: [birth year]  Today's Date: 02/03/2017  Modified Barium Swallow completed.  Full report located under Chart Review in the Imaging Section.  Brief recommendations include the following:  Clinical Impression  Patient presents with mild oropharyngeal dysphagia and suspected primary esophageal dysphagia. Oral stage characterized by impaired mastication and prolonged bolus preparation due to absence of dentition. Oral containment adequate without premature spillage. Swallow initiation at the base of tongue for solids, valleculae for liquids. Pharyngeal stage characterized by mildly decreased base of tongue retraction and pharyngeal constriction. Hyolaryngeal excursion appears adequate. Decreased amplitude/duration of cricopharyngeal opening and suspected suboptimal esophageal pressure impedes full clearance resulting in residue in the valleculae and pyriform sinuses, solids>liquids. Chin tuck with dry swallow reduces residue. Occasional shallow penetration of residue after the swallow and with larger, consecutive sips of thin liquids. Pt clears with cued throat clear. Intermittent backflow through the cervical esophagus and to the pharynx. Recommend to consume dys 3 (mechanical soft) diet with thin liquids, medications one at a time with liquid, with mild-moderate risk for aspiration. Clear throat intermittently, dry swallow with chin tuck after solids to decrease residue. SLP reviewed results and recommendations with patient immediately following the exam. No prior esophageal examinations are found in chart, however pt tells SLP he was told to have his esophagus stretched (unclear when or by whom) but did not have this done. Recommend further esophageal w/u.    Swallow Evaluation Recommendations   Recommended Consults: Consider esophageal assessment;Consider ENT evaluation   SLP Diet  Recommendations: Dysphagia 3 (Mech soft) solids;Thin liquid   Liquid Administration via: Cup;Straw   Medication Administration: Whole meds with liquid   Supervision: Intermittent supervision to cue for compensatory strategies   Compensations: Slow rate;Small sips/bites;Multiple dry swallows after each bite/sip;Clear throat intermittently;Follow solids with liquid(tuck chin with dry swallow after solids)       Oral Care Recommendations: Oral care BID      Rondel BatonMary Beth Ladislaus Repsher, TennesseeMS, CCC-SLP Speech-Language Pathologist [phone]  [name] 02/03/2017,12:29 PM

## 2017-02-03 NOTE — Progress Notes (Signed)
SLP Cancellation Note  Patient Details Name: Zachary RavelSamuel D Pulaski MRN: [id number] DOB: [birth year]   Cancelled treatment:        Reviewed 02/02/17 CXR which showed increasing infiltrate in the right base. Pt made NPO yesterday due to decline in respiratory status. Per RN, pt stable today. Spoke with pt, who is alert and cooperative; will proceed with objective testing (MBS) to aid in determining safety prior to initiating PO diet.   [name], [place], CCC-SLP Speech-Language Pathologist [phone]  [name] 02/03/2017, 8:47 AM

## 2017-02-03 NOTE — Progress Notes (Signed)
Pt still experiencing some hallucinations, (spiders crawling up the wall)  however he is verbalizing that he knows what he is seeing is not real.

## 2017-02-03 NOTE — Progress Notes (Signed)
Patient c/o not being able to void and bladder scan was done and 999 ml present. In and out cath done and 900 ml emptied from bladder. I will continue to monitor.

## 2017-02-03 NOTE — Evaluation (Signed)
Occupational Therapy Evaluation Patient Details Name: Zachary RavelSamuel D Peterson MRN: [id number] DOB: 24-Sep-1931 Today's Date: 02/03/2017    History of Present Illness 84 y.o. male admitted on 01/26/17 for acute respiratory distress s/p seizure.  CT of head was negative.  Pt was intubated 01/26/17-01/28/17.  Other significant dx: PNA, dysphasia, hypophosphatemia, bacteremia, encepholopathy, HTN and fever. Pt with significant PMH of syncope, seizure, a-fib s/p cardioversion, migraine, HTN, GIB, HOH, COPD, back pain s/p surgery, fatigue and depression.     Clinical Impression   Pt admitted with above. He demonstrates the below listed deficits and will benefit from continued OT to maximize safety and independence with BADLs.  Pt presents to OT with generalized weakness, decreased activity tolerance, decreased balance, impaired cognition.  He requires mod - total A for ADLs and fatigues quickly with activity.   He lived with wife and was independent with ADLs PTA.  Feel he will need SNF level rehab at discharge to allow him to maximize safety and independence with ADLs.       Follow Up Recommendations  SNF    Equipment Recommendations  None recommended by OT    Recommendations for Other Services       Precautions / Restrictions Precautions Precautions: Fall;Other (comment) Precaution Comments: monitor vitals/BP, seizure precautions (per family pt always reports nausea before he has a seizure)      Mobility Bed Mobility Overal bed mobility: Needs Assistance Bed Mobility: Supine to Sit     Supine to sit: Mod assist;+2 for safety/equipment     General bed mobility comments: assist for sequecing to move LEs off bed and to lift trunk   Transfers Overall transfer level: Needs assistance Equipment used: Ambulation equipment used Transfers: Sit to/from Stand;Stand Pivot Transfers Sit to Stand: +2 physical assistance Stand pivot transfers: Mod assist;+2 physical assistance       General  transfer comment: assist to power up into standing and assist for balance    Balance Overall balance assessment: Needs assistance Sitting-balance support: Feet supported;Bilateral upper extremity supported Sitting balance-Leahy Scale: Poor Sitting balance - Comments: Pt looses balance to the Right  Postural control: Right lateral lean Standing balance support: Bilateral upper extremity supported Standing balance-Leahy Scale: Poor Standing balance comment: mod A for balance                            ADL either performed or assessed with clinical judgement   ADL Overall ADL's : Needs assistance/impaired Eating/Feeding: Independent   Grooming: Wash/dry hands;Wash/dry face;Oral care;Brushing hair;Min guard;Sitting   Upper Body Bathing: Moderate assistance;Sitting   Lower Body Bathing: Total assistance;Sit to/from stand   Upper Body Dressing : Sitting;Total assistance   Lower Body Dressing: Total assistance;Sit to/from stand   Toilet Transfer: Moderate assistance;Total assistance;Stand-pivot;BSC   Toileting- Clothing Manipulation and Hygiene: Total assistance;Sit to/from stand       Functional mobility during ADLs: Moderate assistance;+2 for physical assistance General ADL Comments: Pt fatigues quickly      Vision         Perception     Praxis      Pertinent Vitals/Pain Pain Assessment: No/denies pain     Hand Dominance Right   Extremity/Trunk Assessment Upper Extremity Assessment Upper Extremity Assessment: Generalized weakness   Lower Extremity Assessment Lower Extremity Assessment: Defer to PT evaluation   Cervical / Trunk Assessment Cervical / Trunk Assessment: Kyphotic   Communication Communication Communication: HOH   Cognition Arousal/Alertness: Awake/alert Behavior During [facility]: [facility] for  tasks assessed/performed Overall Cognitive Status: Impaired/Different from baseline Area of Impairment: Orientation;Attention;Memory;Following  commands;Safety/judgement;Awareness;Problem solving                 Orientation Level: Disoriented to;Time Current Attention Level: Sustained Memory: Decreased short-term memory Following Commands: Follows one step commands consistently Safety/Judgement: Decreased awareness of safety;Decreased awareness of deficits Awareness: Intellectual Problem Solving: Slow processing;Decreased initiation;Difficulty sequencing;Requires verbal cues;Requires tactile cues General Comments: Pt is generally weak, slow to process, and disoriented by long hospitalization.  He is able to follow my one step commands once aroused, but was not really aware of his significant lean in sitting and could not report to me which direction he was leaning.  Slow to process and answer, but when he did was often accurate information.    General Comments  VSS     Exercises     Shoulder Instructions      Home Living Family/patient expects to be discharged to:: Skilled nursing facility Living Arrangements: Spouse/significant other Available Help at Discharge: Family;Available 24 hours/day Type of Home: House Home Access: Stairs to enter [employer] of Steps: 4 Entrance Stairs-Rails: Right;Left;Can reach both Home Layout: One level     Bathroom Shower/Tub: Tub/shower unit         Home Equipment: Cane - single point          Prior Functioning/Environment Level of Independence: Independent        Comments: Pt walks with a cane in the community and usually walks independently in the house.         OT Problem List: Decreased strength;Decreased activity tolerance;Impaired balance (sitting and/or standing);Decreased cognition;Decreased safety awareness;Decreased knowledge of use of DME or AE;Impaired sensation      OT Treatment/Interventions: Self-care/ADL training;Therapeutic exercise;DME and/or AE instruction;Therapeutic activities;Cognitive remediation/compensation;Patient/family  education;Balance training    OT Goals(Current goals can be found in the care plan section) Acute Rehab OT Goals Patient Stated Goal: to regain independence OT Goal Formulation: With patient/family Time For Goal Achievement: 02/17/17 Potential to Achieve Goals: Good ADL Goals Pt Will Perform Grooming: with mod assist;standing Pt Will Perform Upper Body Bathing: with min assist;sitting Pt Will Transfer to Toilet: with mod assist;stand pivot transfer;regular height toilet;bedside commode;grab bars Pt Will Perform Toileting - Clothing Manipulation and hygiene: with mod assist;sit to/from stand Pt/caregiver will Perform Home Exercise Program: Increased strength;Increased ROM;Left upper extremity;Right Upper extremity;With written HEP provided;With Supervision  OT Frequency: Min 2X/week   Barriers to D/C: Decreased caregiver support          Co-evaluation              AM-PAC PT "6 Clicks" Daily Activity     Outcome Measure Help from another person eating meals?: A Little Help from another person taking care of personal grooming?: A Little Help from another person toileting, which includes using toliet, bedpan, or urinal?: A Lot Help from another person bathing (including washing, rinsing, drying)?: A Lot Help from another person to put on and taking off regular upper body clothing?: Total Help from another person to put on and taking off regular lower body clothing?: Total 6 Click Score: 12   End of Session Equipment Utilized During Treatment: Oxygen Nurse Communication: Mobility status;Need for lift equipment  Activity Tolerance: Patient limited by fatigue Patient left: in chair;with call bell/phone within reach;with family/visitor present  OT Visit Diagnosis: Unsteadiness on feet (R26.81);Muscle weakness (generalized) (M62.81)                Time: 4132-4401 OT Time  Calculation (min): 32 min Charges:    G-Codes:     Jeani Hawking, OTR/L [phone]   Jeani Hawking  M 02/03/2017, 1:37 PM

## 2017-02-04 ENCOUNTER — Other Ambulatory Visit: Payer: Self-pay

## 2017-02-04 ENCOUNTER — Inpatient Hospital Stay (HOSPITAL_COMMUNITY): Payer: Medicare HMO

## 2017-02-04 DIAGNOSIS — E876 Hypokalemia: Secondary | ICD-10-CM

## 2017-02-04 DIAGNOSIS — J449 Chronic obstructive pulmonary disease, unspecified: Secondary | ICD-10-CM

## 2017-02-04 DIAGNOSIS — D72829 Elevated white blood cell count, unspecified: Secondary | ICD-10-CM

## 2017-02-04 DIAGNOSIS — I480 Paroxysmal atrial fibrillation: Secondary | ICD-10-CM

## 2017-02-04 DIAGNOSIS — J9601 Acute respiratory failure with hypoxia: Secondary | ICD-10-CM

## 2017-02-04 DIAGNOSIS — I10 Essential (primary) hypertension: Secondary | ICD-10-CM

## 2017-02-04 DIAGNOSIS — R1310 Dysphagia, unspecified: Secondary | ICD-10-CM

## 2017-02-04 DIAGNOSIS — D62 Acute posthemorrhagic anemia: Secondary | ICD-10-CM

## 2017-02-04 LAB — GLUCOSE, CAPILLARY
GLUCOSE-CAPILLARY: 109 mg/dL — AB (ref 65–99)
GLUCOSE-CAPILLARY: 97 mg/dL (ref 65–99)
Glucose-Capillary: 127 mg/dL — ABNORMAL HIGH (ref 65–99)
Glucose-Capillary: 122 mg/dL — ABNORMAL HIGH (ref 65–99)
Glucose-Capillary: 170 mg/dL — ABNORMAL HIGH (ref 65–99)
Glucose-Capillary: 120 mg/dL — ABNORMAL HIGH (ref 65–99)

## 2017-02-04 LAB — CBC
HEMATOCRIT: 33.1 % — AB (ref 39.0–52.0)
Hemoglobin: 11.3 g/dL — ABNORMAL LOW (ref 13.0–17.0)
MCH: 31 pg (ref 26.0–34.0)
MCHC: 34.1 g/dL (ref 30.0–36.0)
MCV: 90.9 fL (ref 78.0–100.0)
Platelets: 451 10*3/uL — ABNORMAL HIGH (ref 150–400)
RBC: 3.64 MIL/uL — ABNORMAL LOW (ref 4.22–5.81)
RDW: 13.8 % (ref 11.5–15.5)
WBC: 15.9 10*3/uL — ABNORMAL HIGH (ref 4.0–10.5)

## 2017-02-04 LAB — PROTIME-INR
INR: 1.28
Prothrombin Time: 15.9 seconds — ABNORMAL HIGH (ref 11.4–15.2)

## 2017-02-04 LAB — PHENYTOIN LEVEL, TOTAL: Phenytoin Lvl: 4.2 ug/mL — ABNORMAL LOW (ref 10.0–20.0)

## 2017-02-04 LAB — HEPARIN LEVEL (UNFRACTIONATED): Heparin Unfractionated: 0.37 IU/mL (ref 0.30–0.70)

## 2017-02-04 MED ORDER — TAMSULOSIN HCL 0.4 MG PO CAPS
0.4000 mg | ORAL_CAPSULE | Freq: Every day | ORAL | Status: DC
  Administered 2017-02-04 – 2017-02-09 (×6): 0.4 mg via ORAL
  Filled 2017-02-04 (×6): qty 1

## 2017-02-04 MED ORDER — HALOPERIDOL LACTATE 5 MG/ML IJ SOLN
2.0000 mg | Freq: Four times a day (QID) | INTRAMUSCULAR | Status: DC | PRN
  Filled 2017-02-04: qty 1

## 2017-02-04 MED ORDER — RISPERIDONE 0.5 MG PO TBDP
0.5000 mg | ORAL_TABLET | Freq: Two times a day (BID) | ORAL | Status: DC
  Administered 2017-02-04 – 2017-02-07 (×6): 0.5 mg via ORAL
  Filled 2017-02-04 (×7): qty 1

## 2017-02-04 MED ORDER — FUROSEMIDE 10 MG/ML IJ SOLN
40.0000 mg | Freq: Once | INTRAMUSCULAR | Status: AC
  Administered 2017-02-04: 40 mg via INTRAVENOUS
  Filled 2017-02-04: qty 4

## 2017-02-04 MED ORDER — WARFARIN SODIUM 10 MG PO TABS
10.0000 mg | ORAL_TABLET | Freq: Once | ORAL | Status: AC
  Administered 2017-02-04: 10 mg via ORAL
  Filled 2017-02-04: qty 1

## 2017-02-04 NOTE — Progress Notes (Signed)
Entered room d/t Pt is stating that he needs to urinate, urinal was provided and patient was able to urinate approx 50 cc at this time. Bladder scan done afterwards and >600 still in bladder, he feels the need to urinate but unable to at this time, pt was previously not able to urinate at all on his on, Flomax was added today, will do I&O cath one more time to try to give Flomax more time to work and since patient is starting to urinate small amounts on his own.

## 2017-02-04 NOTE — Progress Notes (Signed)
Pt groggy this AM and not following commands, unable to swallow pills or eat breakfast, able to open eyes and look around but not verbally responding at this time. Pt received Ativan last night for agitation, at this time all VSS. Primary MD aware, will monitor patient.

## 2017-02-04 NOTE — Progress Notes (Signed)
Pt becoming increasingly more alert, able to answer all orientation questions (knows his name, where he is, day of the week and why he is here.) Family is concerned with his change in mentation from yesterday but this RN reassured them that it is likely due to medication and that we are aware and monitoring the patient.

## 2017-02-04 NOTE — Progress Notes (Signed)
Patient did not sleep last night he first became agitated and also had hallucinations. He pulled a IV out. He continued with agitation I called MD and she agreed that we could try dose ativan it was given however not effective. IV team put another IV in patient was a very difficult stick . Patient was moving around and restless and BP stayed elevated at times PRN med given to help control.  He managed to pulled another IV out and not able to receive all of his antibiotic therapy heparin is infusing in second site. MD was notified again for updates and patient may benefit from from Flomax. MD to place orders and I will continue to monitor.

## 2017-02-04 NOTE — Progress Notes (Signed)
  Speech Language Pathology Treatment: Dysphagia  Patient Details Name: Zachary RavelSamuel D Peterson MRN: [id number] DOB: March 17, 1931 Today's Date: 02/04/2017 Time: [id number] SLP Time Calculation (min) (ACUTE ONLY): 10 min  Assessment / Plan / Recommendation Clinical Impression  Pt needed min prompting and extra time to recall swallow strategies and implemented with moderate verbal and tactile cues. Delayed cough; unable to determine if cause was po's with observation only. Wife reported he consumed lunch today without coughing (minimal intake but more than previous). Pt will continue to need cues to demonstrate strategies and decrease aspiration risk. Continue Dys 3, thin.   HPI HPI: 84 y.o. male with known seizure disorder admitted through ED in respiratory distress following seizure requiring intubation. ETT 1/19-21.       SLP Plan  Continue with current plan of care       Recommendations  Diet recommendations: Dysphagia 3 (mechanical soft);Thin liquid Liquids provided via: Cup Medication Administration: Crushed with puree Supervision: Full supervision/cueing for compensatory strategies Compensations: Slow rate;Small sips/bites;Multiple dry swallows after each bite/sip;Clear throat intermittently;Follow solids with liquid;Lingual sweep for clearance of pocketing Postural Changes and/or Swallow Maneuvers: Chin tuck;Seated upright 90 degrees(chin tuck during second swallow with solids)                Oral Care Recommendations: Oral care BID Follow up Recommendations: Skilled Nursing facility SLP Visit Diagnosis: Dysphagia, pharyngeal phase (R13.13) Plan: Continue with current plan of care                       Royce MacadamiaLitaker, Gavriella Hearst Willis 02/04/2017, 2:11 PM   Breck CoonsLisa Willis Lonell FaceLitaker M.Ed [employer]-SLP Pager [phone]

## 2017-02-04 NOTE — Progress Notes (Signed)
Physical Therapy Treatment Patient Details Name: Zachary Peterson MRN: [id number] DOB: Aug 31, 1931 Today's Date: 02/04/2017    History of Present Illness 84 y.o. male admitted on 01/26/17 for acute respiratory distress s/p seizure.  CT of head was negative.  Pt was intubated 01/26/17-01/28/17.  Other significant dx: PNA, dysphasia, hypophosphatemia, bacteremia, encepholopathy, HTN and fever. Pt with significant PMH of syncope, seizure, a-fib s/p cardioversion, migraine, HTN, GIB, HOH, COPD, back pain s/p surgery, fatigue and depression.      PT Comments    Pt progressed gait training this pm and more alert during session.  Pt AxO x4 pre session, BP 133/45 pre tx.  Pt desaturated with activity to 88% on 3L [place] required 4L to maintain greater than 90%.  Plan next session for gait training and functional mobility to tolerance.  Plan for CIR remains appropriate as patient is progressing and tolerating increased activity.     Follow Up Recommendations  CIR     Equipment Recommendations  Rolling walker with 5" wheels;Hospital bed    Recommendations for Other Services       Precautions / Restrictions Precautions Precautions: Fall;Other (comment) Precaution Comments: monitor vitals/BP, seizure precautions Restrictions Weight Bearing Restrictions: No    Mobility  Bed Mobility Overal bed mobility: Needs Assistance Bed Mobility: Supine to Sit;Sit to Supine     Supine to sit: Mod assist;+2 for safety/equipment     General bed mobility comments: assist for sequecing to move LEs off bed and to lift trunk   Transfers Overall transfer level: Needs assistance Equipment used: Rolling walker (2 wheeled) Transfers: Sit to/from Stand Sit to Stand: Min assist;+2 physical assistance         General transfer comment: assist to power up into standing and assist for balance, cues for hand placement and forward weight shifting.    Ambulation/Gait Ambulation/Gait assistance: Mod assist Ambulation  Distance (Feet): 10 Feet Assistive device: Rolling walker (2 wheeled) Gait Pattern/deviations: Step-through pattern;Decreased stride length;Trunk flexed     General Gait Details: Cues for upper trunk control and forward gaze, desaturation to 88% on 3L, required 4L to improve greater than 90%.  Close chair follow during gait.     Stairs            Wheelchair Mobility    Modified Rankin (Stroke Patients Only)       Balance Overall balance assessment: Needs assistance   Sitting balance-Leahy Scale: Poor       Standing balance-Leahy Scale: Fair Standing balance comment: mod A for balance                             Cognition Arousal/Alertness: Awake/alert Behavior During Therapy: WFL for tasks assessed/performed Overall Cognitive Status: Within Functional Limits for tasks assessed                                 General Comments: Pt alert and oriented during session and followed commands well, however nursing reports earlier in the day he was showing signs of confusion not present during session.        Exercises      General Comments        Pertinent Vitals/Pain Pain Assessment: No/denies pain Faces Pain Scale: Hurts little more Pain Intervention(s): Monitored during session    Home Living  Prior Function            PT Goals (current goals can now be found in the care plan section) Acute Rehab PT Goals Patient Stated Goal: to regain independence Potential to Achieve Goals: Good Progress towards PT goals: Progressing toward goals    Frequency    Min 3X/week      PT Plan Current plan remains appropriate    Co-evaluation              AM-PAC PT "6 Clicks" Daily Activity  Outcome Measure  Difficulty turning over in bed (including adjusting bedclothes, sheets and blankets)?: Unable Difficulty moving from lying on back to sitting on the side of the bed? : Unable Difficulty sitting down  on and standing up from a chair with arms (e.g., wheelchair, bedside commode, etc,.)?: Unable Help needed moving to and from a bed to chair (including a wheelchair)?: A Lot Help needed walking in hospital room?: A Lot Help needed climbing 3-5 steps with a railing? : Total 6 Click Score: 8    End of Session Equipment Utilized During Treatment: Oxygen;Gait belt(3-4L [place]) Activity Tolerance: Patient limited by fatigue Patient left: with family/visitor present;in bed;with call bell/phone within reach;with nursing/sitter in room Nurse Communication: Mobility status PT Visit Diagnosis: Muscle weakness (generalized) (M62.81);Difficulty in walking, not elsewhere classified (R26.2)     Time: [id number] PT Time Calculation (min) (ACUTE ONLY): 27 min  Charges:  $Gait Training: 8-22 mins $Therapeutic Activity: 8-22 mins                    G Codes:       Joycelyn RuaAimee Krystan Northrop, PTA pager [phone]    Florestine Aversimee J Alene Bergerson 02/04/2017, 3:48 PM

## 2017-02-04 NOTE — Consult Note (Signed)
Physical Medicine and Rehabilitation Consult   Reason for Consult: Encephalopathy. Referring Physician: Edward JollySilva.    HPI: Zachary Peterson is a 84 y.o. male with history of COPD, Afib, HTN, seizure disorder who was [facility] for generalized seizure and discharged to home as work up negative and dilantin level therapeutic. History taken from chart review and wife.  Family reported multiple seizure at home with another witnessed episode by EMS and patient was noted to be hypoxic and tachyphenic requiring intubation in ED. He was loaded with Keppra and work up revealed aspiration PNA as well as metabolic derangements. He was started on broad spectrum antibiotics and tolerated extubation on 01/21.  He has had issues with excessive sedation and encephalopathy felt to be due to Keppra therefore this was discontinued and ativan weaned off with improvement in mentation. MRI brain reviewed, unremarkable for acute intracranial process. Respiratory status has improved but he continues to have issues with confusion, bouts of agitation, hallucinations, difficulty following simple commands with delayed processing as well as debility. CIR recommended due to functional deficits.    Does not drive anymore due to memory issues. Was independent with cane PTA.   Review of Systems  Unable to perform ROS: Patient nonverbal      Past Medical History:  Diagnosis Date  . Back pain   . COPD (chronic obstructive pulmonary disease) (HCC)   . Depression   . Fatigue    a. in setting of Afib, improved following DCCV.  [name]. Hearing loss   . History of GI bleed 1976  . Hyperlipemia   . Hypertension   . Migraine   . Palpitations   . Persistent atrial fibrillation (HCC)    a. 02/2016 Echo: EF 66-65%, mild MR;  b. CHA2DS2VASc = 3-->coumadin;  c. 05/2016 TEE/DCCV: EF 45-50%, triv AI, mild to mod MR, no LA/LAA/RA/RAA thrombus, mod to sev TR-->successful DCCV.  . Seizure (HCC)   . Syncope    a. related to  seizure disorder   Past Surgical History:  Procedure Laterality Date  . APPENDECTOMY    . BACK SURGERY    . CARDIOVERSION N/A 06/06/2016   Procedure: CARDIOVERSION;  Surgeon: Yvonne KendallEnd, Christopher, MD;  Location: [facility] ORS;  Service: Cardiovascular;  Laterality: N/A;  . CATARACT EXTRACTION    . hemmoridectomy    . HERNIA REPAIR    . TEE WITHOUT CARDIOVERSION N/A 06/06/2016   Procedure: TRANSESOPHAGEAL ECHOCARDIOGRAM (TEE);  Surgeon: Yvonne KendallEnd, Christopher, MD;  Location: [facility] ORS;  Service: Cardiovascular;  Laterality: N/A;   Family History  Problem Relation Age of Onset  . Heart disease Mother   . Hypertension Father    Social History:  reports that he has quit smoking. He has quit using smokeless tobacco. He reports that he does not drink alcohol or use drugs. Allergies:  Allergies  Allergen Reactions  . Aspirin Other (See Comments)    CAUSES ULCERS  . Codeine Nausea And Vomiting  . Oxycodone Nausea And Vomiting   Medications Prior to Admission  Medication Sig Dispense Refill  . bisoprolol (ZEBETA) 5 MG tablet Take 0.5 tablets (2.5 mg total) by mouth 2 (two) times daily. 90 tablet 3  . calcium carbonate (TUMS - DOSED IN MG ELEMENTAL CALCIUM) 500 MG chewable tablet Chew 1 tablet (200 mg of elemental calcium total) by mouth 3 (three) times daily with meals as needed for indigestion or heartburn.    . diazepam (VALIUM) 5 MG tablet Take 5 mg by mouth 2 (  two) times daily.     [name] DILANTIN 100 MG ER capsule Take 100mg  by mouth 3 times daily then the next day take 100mg s by mouth 2 times daily,alternating tid and bid every other day    . ezetimibe-simvastatin (VYTORIN) 10-80 MG tablet Take 1 tablet by mouth daily.    . fluticasone (FLONASE) 50 MCG/ACT nasal spray Place 2 sprays into both nostrils daily as needed for allergies.     . hydroxypropyl methylcellulose / hypromellose (ISOPTO TEARS / GONIOVISC) 2.5 % ophthalmic solution Place 1 drop into both eyes 3 (three) times daily as needed for dry eyes.     [name] ibuprofen (ADVIL,MOTRIN) 200 MG tablet Take 200 mg by mouth every 6 (six) hours as needed for headache or moderate pain.     . mirtazapine (REMERON) 15 MG tablet Take 15 mg by mouth at bedtime.    . ondansetron (ZOFRAN ODT) 8 MG disintegrating tablet Take 1 tablet (8 mg total) by mouth every 8 (eight) hours as needed for nausea or vomiting. 15 tablet 0  . pantoprazole (PROTONIX) 20 MG tablet Take 20 mg by mouth daily.    . pregabalin (LYRICA) 75 MG capsule Take 75 mg by mouth 2 (two) times daily.    [name] tiotropium (SPIRIVA) 18 MCG inhalation capsule Place 18 mcg into inhaler and inhale daily.    [name] warfarin (COUMADIN) 2.5 MG tablet TAKE 1 OR 2 TABLETS BY MOUTH EVERY DAY OR AS PHYSICIAN INSTRUCTED BY COUMADIN CLINIC 60 tablet 1  . furosemide (LASIX) 20 MG tablet Take 1 tablet (20 mg total) by mouth daily as needed. (Patient not taking: Reported on 01/26/2017) 90 tablet 3  . HYDROcodone-acetaminophen (NORCO) 5-325 MG per tablet Take 1 tablet by mouth every 6 (six) hours as needed for moderate pain. (Patient not taking: Reported on 01/26/2017) 15 tablet 0    Home: Home Living Family/patient expects to be discharged to:: Private residence Living Arrangements: Spouse/significant other Available Help at Discharge: Family, Available 24 hours/day Type of Home: House Home Access: Stairs to enter [employer] of Steps: 4 Entrance Stairs-Rails: Right, Left, Can reach both Home Layout: One level Bathroom Shower/Tub: Tub/shower unit Home Equipment: Cane - single point  Functional History: Prior Function Level of Independence: Independent Comments: Pt walks with a cane in the community and usually walks independently in the house.  Functional Status:  Mobility: Bed Mobility Overal bed mobility: Needs Assistance Bed Mobility: Supine to Sit Supine to sit: Mod assist, +2 for safety/equipment General bed mobility comments: assist for sequecing to move LEs off bed and to lift trunk    Transfers Overall transfer level: Needs assistance Equipment used: Ambulation equipment used Transfer via Lift Equipment: Stedy Transfers: Sit to/from Stand, [employer] Transfers Sit to Stand: +2 physical assistance Stand pivot transfers: Mod assist, +2 physical assistance General transfer comment: assist to power up into standing and assist for balance Ambulation/Gait General Gait Details: unable at this time, possibly next session with portable O2 and chair to follow closely.     ADL: ADL Overall ADL's : Needs assistance/impaired Eating/Feeding: Independent Grooming: Wash/dry hands, Wash/dry face, Oral care, Brushing hair, Min guard, Sitting Upper Body Bathing: Moderate assistance, Sitting Lower Body Bathing: Total assistance, Sit to/from stand Upper Body Dressing : Sitting, Total assistance Lower Body Dressing: Total assistance, Sit to/from stand Toilet Transfer: Moderate assistance, Total assistance, Stand-pivot, BSC Toileting- Clothing Manipulation and Hygiene: Total assistance, Sit to/from stand Functional mobility during ADLs: Moderate assistance, +2 for physical assistance General ADL Comments: Pt  fatigues quickly   Cognition: Cognition Overall Cognitive Status: Impaired/Different from baseline Orientation Level: Oriented to person, Oriented to place, Oriented to situation, Disoriented to time Cognition Arousal/Alertness: Awake/alert Behavior During Therapy: WFL for tasks assessed/performed Overall Cognitive Status: Impaired/Different from baseline Area of Impairment: Orientation, Attention, Memory, Following commands, Safety/judgement, Awareness, Problem solving Orientation Level: Disoriented to, Time Current Attention Level: Sustained Memory: Decreased short-term memory Following Commands: Follows one step commands consistently Safety/Judgement: Decreased awareness of safety, Decreased awareness of deficits Awareness: Intellectual Problem Solving: Slow  processing, Decreased initiation, Difficulty sequencing, Requires verbal cues, Requires tactile cues General Comments: Pt is generally weak, slow to process, and disoriented by long hospitalization.  He is able to follow my one step commands once aroused, but was not really aware of his significant lean in sitting and could not report to me which direction he was leaning.  Slow to process and answer, but when he did was often accurate information.   Blood pressure (!) 118/102, pulse 97, temperature 98.9 F (37.2 C), temperature source Axillary, resp. rate (!) 32, height 5\' 7"  (1.702 m), weight 68.4 kg (150 lb 12.7 oz), SpO2 95 %. Physical Exam  Vitals reviewed. Constitutional: He appears well-developed and well-nourished.  HENT:  Head: Normocephalic and atraumatic.  Eyes: EOM are normal. Right eye exhibits no discharge. Left eye exhibits no discharge.  Neck: Normal range of motion. Neck supple.  Cardiovascular: Normal rate and regular rhythm.  Respiratory: Effort normal and breath sounds normal.  +Continental  GI: Soft. Bowel sounds are normal.  Musculoskeletal:  No edema or tenderness in extremities  Neurological: He is alert.  Nonverbal Motor (limited due to participation): B/l UE: 4-/5 proximal to distal RLE: 4-/5 proximal to distal LLE: not moving ?involuntary oral movements  Skin: Skin is warm and dry.  Psychiatric: His affect is blunt. He is slowed. He is noncommunicative.    Results for orders placed or performed during the hospital encounter of 01/26/17 (from the past 24 hour(s))  Glucose, capillary     Status: Abnormal   Collection Time: 02/03/17 12:12 PM  Result Value Ref Range   Glucose-Capillary 116 (H) 65 - 99 mg/dL   Comment 1 Notify RN    Comment 2 Document in Chart   Glucose, capillary     Status: Abnormal   Collection Time: 02/03/17  4:20 PM  Result Value Ref Range   Glucose-Capillary 157 (H) 65 - 99 mg/dL   Comment 1 Notify RN    Comment 2 Document in Chart    Glucose, capillary     Status: Abnormal   Collection Time: 02/03/17  7:43 PM  Result Value Ref Range   Glucose-Capillary 126 (H) 65 - 99 mg/dL  Glucose, capillary     Status: Abnormal   Collection Time: 02/03/17 11:34 PM  Result Value Ref Range   Glucose-Capillary 109 (H) 65 - 99 mg/dL  Protime-INR     Status: Abnormal   Collection Time: 02/04/17  2:28 AM  Result Value Ref Range   Prothrombin Time 15.9 (H) 11.4 - 15.2 seconds   INR 1.28   CBC     Status: Abnormal   Collection Time: 02/04/17  2:28 AM  Result Value Ref Range   WBC 15.9 (H) 4.0 - 10.5 K/uL   RBC 3.64 (L) 4.22 - 5.81 MIL/uL   Hemoglobin 11.3 (L) 13.0 - 17.0 g/dL   HCT 40.9 (L) 81.1 - 91.4 %   MCV 90.9 78.0 - 100.0 fL   MCH 31.0 26.0 - 34.0  pg   MCHC 34.1 30.0 - 36.0 g/dL   RDW 96.2 95.2 - 84.1 %   Platelets 451 (H) 150 - 400 K/uL  Heparin level (unfractionated)     Status: None   Collection Time: 02/04/17  2:28 AM  Result Value Ref Range   Heparin Unfractionated 0.37 0.30 - 0.70 IU/mL  Glucose, capillary     Status: Abnormal   Collection Time: 02/04/17  3:11 AM  Result Value Ref Range   Glucose-Capillary 127 (H) 65 - 99 mg/dL   Dg Swallowing Func-speech Pathology  Result Date: 02/03/2017 Objective Swallowing Evaluation: Type of Study: MBS-Modified Barium Swallow Study  Patient Details Name: MELBURN TREIBER MRN: [id number] Date of Birth: 1931-06-10 Today's Date: 02/03/2017 Time: SLP Start Time (ACUTE ONLY): 1105 -SLP Stop Time (ACUTE ONLY): 1125 SLP Time Calculation (min) (ACUTE ONLY): 20 min Past Medical History: Past Medical History: Diagnosis Date . Back pain  . COPD (chronic obstructive pulmonary disease) (HCC)  . Depression  . Fatigue   a. in setting of Afib, improved following DCCV. [name] Hearing loss  . History of GI bleed 1976 . Hyperlipemia  . Hypertension  . Migraine  . Palpitations  . Persistent atrial fibrillation (HCC)   a. 02/2016 Echo: EF 66-65%, mild MR;  b. CHA2DS2VASc = 3-->coumadin;  c. 05/2016 TEE/DCCV: EF  45-50%, triv AI, mild to mod MR, no LA/LAA/RA/RAA thrombus, mod to sev TR-->successful DCCV. . Seizure (HCC)  . Syncope   a. related to seizure disorder Past Surgical History: Past Surgical History: Procedure Laterality Date . APPENDECTOMY   . BACK SURGERY   . CARDIOVERSION N/A 06/06/2016  Procedure: CARDIOVERSION;  Surgeon: Yvonne Kendall, MD;  Location: [facility] ORS;  Service: Cardiovascular;  Laterality: N/A; . CATARACT EXTRACTION   . hemmoridectomy   . HERNIA REPAIR   . TEE WITHOUT CARDIOVERSION N/A 06/06/2016  Procedure: TRANSESOPHAGEAL ECHOCARDIOGRAM (TEE);  Surgeon: Yvonne Kendall, MD;  Location: [facility] ORS;  Service: Cardiovascular;  Laterality: N/A; HPI: 84 y.o. male with known seizure disorder admitted through ED in respiratory distress following seizure requiring intubation. ETT 1/19-21.  Subjective: "I'm good if I get to eat." Assessment / Plan / Recommendation CHL IP CLINICAL IMPRESSIONS 02/03/2017 Clinical Impression Patient presents with mild oropharyngeal dysphagia and suspected primary esophageal dysphagia. Oral stage characterized by impaired mastication and prolonged bolus preparation due to absence of dentition. Oral containment adequate without premature spillage. Swallow initiation at the base of tongue for solids, valleculae for liquids. Pharyngeal stage characterized by mildly decreased base of tongue retraction and pharyngeal constriction. Hyolaryngeal excursion appears adequate. Decreased amplitude/duration of cricopharyngeal opening and suspected suboptimal esophageal pressure impedes full clearance resulting in residue in the valleculae and pyriform sinuses, solids>liquids. Chin tuck with dry swallow reduces residue. Occasional shallow penetration of residue after the swallow and with larger, consecutive sips of thin liquids. Pt clears with cued throat clear. Intermittent backflow through the cervical esophagus and to the pharynx. Recommend to consume dys 3 (mechanical soft) diet with thin  liquids, medications one at a time with liquid, with mild-moderate risk for aspiration. Clear throat intermittently, dry swallow with chin tuck after solids to decrease residue. SLP reviewed results and recommendations with patient immediately following the exam. No prior esophageal examinations are found in chart, however pt tells SLP he was told to have his esophagus stretched (unclear when or by whom) but did not have this done. Recommend further esophageal w/u.  SLP Visit Diagnosis Dysphagia, pharyngeal phase (R13.13);Dysphagia, pharyngoesophageal phase (R13.14) Attention and concentration deficit following --  Frontal lobe and executive function deficit following -- Impact on safety and function Mild aspiration risk;Moderate aspiration risk   CHL IP TREATMENT RECOMMENDATION 02/03/2017 Treatment Recommendations Therapy as outlined in treatment plan below   Prognosis 02/03/2017 Prognosis for Safe Diet Advancement Good Barriers to Reach Goals -- Barriers/Prognosis Comment -- CHL IP DIET RECOMMENDATION 02/03/2017 SLP Diet Recommendations Dysphagia 3 (Mech soft) solids;Thin liquid Liquid Administration via Cup;Straw Medication Administration Whole meds with liquid Compensations Slow rate;Small sips/bites;Multiple dry swallows after each bite/sip;Clear throat intermittently;Follow solids with liquid Postural Changes --   CHL IP OTHER RECOMMENDATIONS 02/03/2017 Recommended Consults Consider esophageal assessment;Consider ENT evaluation Oral Care Recommendations Oral care BID Other Recommendations --   CHL IP FOLLOW UP RECOMMENDATIONS 02/03/2017 Follow up Recommendations Skilled Nursing facility   [facility] IP FREQUENCY AND DURATION 02/03/2017 Speech Therapy Frequency (ACUTE ONLY) min 2x/week Treatment Duration 1 week      CHL IP ORAL PHASE 02/03/2017 Oral Phase Impaired Oral - Pudding Teaspoon -- Oral - Pudding Cup -- Oral - Honey Teaspoon -- Oral - Honey Cup -- Oral - Nectar Teaspoon -- Oral - Nectar Cup -- Oral - Nectar Straw --  Oral - Thin Teaspoon -- Oral - Thin Cup -- Oral - Thin Straw -- Oral - Puree -- Oral - Mech Soft Impaired mastication Oral - Regular -- Oral - Multi-Consistency -- Oral - Pill -- Oral Phase - Comment --  CHL IP PHARYNGEAL PHASE 02/03/2017 Pharyngeal Phase Impaired Pharyngeal- Pudding Teaspoon -- Pharyngeal -- Pharyngeal- Pudding Cup -- Pharyngeal -- Pharyngeal- Honey Teaspoon -- Pharyngeal -- Pharyngeal- Honey Cup -- Pharyngeal -- Pharyngeal- Nectar Teaspoon -- Pharyngeal -- Pharyngeal- Nectar Cup -- Pharyngeal -- Pharyngeal- Nectar Straw -- Pharyngeal -- Pharyngeal- Thin Teaspoon -- Pharyngeal -- Pharyngeal- Thin Cup -- Pharyngeal -- Pharyngeal- Thin Straw Penetration/Aspiration during swallow;Delayed swallow initiation-vallecula;Reduced pharyngeal peristalsis;Reduced tongue base retraction;Pharyngeal residue - valleculae;Pharyngeal residue - pyriform;Pharyngeal residue - cp segment;Penetration/Apiration after swallow Pharyngeal Material enters airway, remains ABOVE vocal cords and not ejected out Pharyngeal- Puree Reduced tongue base retraction;Reduced pharyngeal peristalsis;Penetration/Apiration after swallow;Pharyngeal residue - valleculae;Pharyngeal residue - pyriform;Pharyngeal residue - posterior pharnyx;Pharyngeal residue - cp segment;Other (Comment) Pharyngeal Material enters airway, remains ABOVE vocal cords and not ejected out Pharyngeal- Mechanical Soft Reduced pharyngeal peristalsis;Reduced tongue base retraction;Pharyngeal residue - valleculae;Pharyngeal residue - posterior pharnyx;Pharyngeal residue - cp segment;Pharyngeal residue - pyriform;Compensatory strategies attempted (with notebox) Pharyngeal -- Pharyngeal- Regular -- Pharyngeal -- Pharyngeal- Multi-consistency -- Pharyngeal -- Pharyngeal- Pill -- Pharyngeal -- Pharyngeal Comment --  CHL IP CERVICAL ESOPHAGEAL PHASE 02/03/2017 Cervical Esophageal Phase Impaired Pudding Teaspoon -- Pudding Cup -- Honey Teaspoon -- Honey Cup -- Nectar Teaspoon --  Nectar Cup -- Nectar Straw -- Thin Teaspoon -- Thin Cup -- Thin Straw Reduced cricopharyngeal relaxation;Prominent cricopharyngeal segment;Esophageal backflow into cervical esophagus Puree Reduced cricopharyngeal relaxation;Prominent cricopharyngeal segment;Esophageal backflow into cervical esophagus Mechanical Soft Reduced cricopharyngeal relaxation;Prominent cricopharyngeal segment Regular -- Multi-consistency -- Pill -- Cervical Esophageal Comment -- Rondel Baton, MS, CCC-SLP Speech-Language Pathologist [phone] No flowsheet data found. Arlana Lindau 02/03/2017, 12:30 PM               Assessment/Plan: Diagnosis: Encephalopathy Labs and images independently reviewed.  Records reviewed and summated above.  1. Does the need for close, 24 hr/day medical supervision in concert with the patient's rehab needs make it unreasonable for this patient to be served in a less intensive setting? Yes  2. Co-Morbidities requiring supervision/potential complications: COPD (monitor RR and O2 sats with increased exertion), Afib (monitor HR with increased  physical activity, transition from heparin ggt when appropriate), HTN (monitor and provide prns in accordance with increased physical exertion and pain, transition to oral meds when appropriate), seizure disorder (cont meds), aspiration PNA (cont abx, d/c Vanc when appropriate), hypokalemia (continue to monitor and replete as necessary), hypophosphatemia (cont to monitor, replete as necessary), leukocytosis (cont to monitor for signs and symptoms of infection, further workup if indicated), ABLA (transfuse if necessary to ensure appropriate perfusion for increased activity tolerance) 3. Due to safety, disease management, medication administration and patient education, does the patient require 24 hr/day rehab nursing? Yes 4. Does the patient require coordinated care of a physician, rehab nurse, PT (1-2 hrs/day, 5 days/week), OT (1-2 hrs/day, 5 days/week) and SLP (1-2  hrs/day, 5 days/week) to address physical and functional deficits in the context of the above medical diagnosis(es)? Yes Addressing deficits in the following areas: balance, endurance, locomotion, strength, transferring, bathing, dressing, grooming, toileting, cognition, speech, swallowing and psychosocial support 5. Can the patient actively participate in an intensive therapy program of at least 3 hrs of therapy per day at least 5 days per week? Potentially 6. The potential for patient to make measurable gains while on inpatient rehab is excellent 7. Anticipated functional outcomes upon discharge from inpatient rehab are supervision and min assist  with PT, supervision and min assist with OT, supervision and min assist with SLP. 8. Estimated rehab length of stay to reach the above functional goals is: 15-18 days. 9. Anticipated D/C setting: Other 10. Anticipated post D/C treatments: SNF. 11. Overall Rehab/Functional Prognosis: good  RECOMMENDATIONS: This patient's condition is appropriate for continued rehabilitative care in the following setting: Per nursing, pt with medication side and able to engage more meaningfully otherwise.  Will cont to follow for improvement in cognition and consider CIR if pt demonstrates ability to make meaningful gains with support at home. Patient has agreed to participate in recommended program. Potentially Note that insurance prior authorization may be required for reimbursement for recommended care.  Comment: Rehab Admissions Coordinator to follow up.  Maryla Morrow, MD, ABPMR Jacquelynn Cree, PA-C 02/04/2017

## 2017-02-04 NOTE — Progress Notes (Signed)
ANTICOAGULATION CONSULT NOTE   Pharmacy Consult for Heparin/Coumadin Indication: atrial fibrillation  Allergies  Allergen Reactions  . Aspirin Other (See Comments)    CAUSES ULCERS  . Codeine Nausea And Vomiting  . Oxycodone Nausea And Vomiting   Patient Measurements: Height: 5\' 7"  (170.2 cm) Weight: 150 lb 12.7 oz (68.4 kg) IBW/kg (Calculated) : 66.1 Heparin dosing weight- 67 kg  Vital Signs: Temp: 98.9 F (37.2 C) (01/28 0800) Temp Source: Axillary (01/28 0800) BP: 118/102 (01/27 2334)  Labs: Recent Labs    02/02/17 0624 02/02/17 2242 02/03/17 0502 02/04/17 0228  HGB 11.1*  --  10.7* 11.3*  HCT 31.7*  --  30.3* 33.1*  PLT 315  --  331 451*  LABPROT 13.8  --  14.6 15.9*  INR 1.07  --  1.15 1.28  HEPARINUNFRC 0.24* 0.48 0.36 0.37  CREATININE 0.91  --   --   --    Estimated Creatinine Clearance: 57.5 mL/min (by C-G formula based on SCr of 0.91 mg/dL).  Medical History: Past Medical History:  Diagnosis Date  . Back pain   . COPD (chronic obstructive pulmonary disease) (HCC)   . Depression   . Fatigue    a. in setting of Afib, improved following DCCV.  [name]. Hearing loss   . History of GI bleed 1976  . Hyperlipemia   . Hypertension   . Migraine   . Palpitations   . Persistent atrial fibrillation (HCC)    a. 02/2016 Echo: EF 66-65%, mild MR;  b. CHA2DS2VASc = 3-->coumadin;  c. 05/2016 TEE/DCCV: EF 45-50%, triv AI, mild to mod MR, no LA/LAA/RA/RAA thrombus, mod to sev TR-->successful DCCV.  . Seizure (HCC)   . Syncope    a. related to seizure disorder   Assessment: 84 y/o M on warfarin PTA for afib, presents to the ED with seizures, [facility] for same issue, required intubation 1/20-1/21.  Pharmacy was consulted at admission to hold warfarin and heparin was started after INR < 2.  Coumadin resumed 1/25, heparin continues until INR back over 2.  Heparin level continues to be therapeutic. INR slowly trending up to 1.28 after 3 doses, response may  be slow due to recent vitamin k received. Will increase warfarin dose tonight.   Noted patient agitated overnight and pulled IV out, appears he did not receive abx overnight will delay checking vancomycin level or consider 24 hour random level.   Home warfarin dose: 5mg  po daily   Goal of Therapy:  Heparin level 0.3-0.7 units/ml  INR 2-3 Monitor platelets by anticoagulation protocol: Yes   Plan:  -Continue heparin at 1650 units/hr -Coumadin 10 mg po x 1 tonight. -Daily heparin level, CBC, INR.  Sheppard CoilFrank Wilson PharmD., BCPS Clinical Pharmacist 02/04/2017 8:15 AM

## 2017-02-04 NOTE — Progress Notes (Addendum)
PROGRESS NOTE [facility]   MADOX CORKINS   ZOX:[id number] DOB: Nov 05, 1931  DOA: 01/26/2017 PCP: Ignacia Palma., MD   Brief Narrative:  Zachary Peterson is an 84 year old male with known seizure disorder, COPD, hypertension, A. fib, and history of GI bleed presented to the emergency department with acute respiratory distress after sustaining a seizure.  After EMS was called patient had another witnessed tonic-clonic seizure, he was intubated for airway protection and he was admitted to the ICU.  Neurology was consulted and patient was loaded with Keppra. Patient was successfully extubated on 1/21, fail swallow eval when for modified barium and recommended mechanical soft diet.    Subjective: Patient seen and examined, patient is more lethargic today and minimally responsive, patient was agitated and received Ativan overnight. Patient went for esophagogram but did not cooperate well, spitted out the contrast.  Assessment & Plan: Acute metabolic encephalopathy - worsen today due to Ativan  Multifactorial - seizures, Pneumonia, hypoxia, hospital delirium  Treat underlying causes AVOID ATIVAN as this make delirium worse  Possible sundowning - will add Risperidone 0.5 mg BID  If agitated can use Haldol  Keep room bright  Outbed as tolerated  PT recommending CIR   Acute respiratory failure with hypoxia due to aspiration PNA - stable  Wean off O2 as tolerated keep O2 sats between 89-92%  Continue Zosyn, patient was on Vancomycin due to ? Bacteremia but only 1/2 bottle grew coagulase negative, likely to be contaminant. d/c Vancomycin  Completed 9 days course of Zosyn for aspiration, will continue to monitor for now  Repeated blood cultures no growth so far Felt that fluid overload also contributed, patient was treated with Lasix   Seizures  On Dilantin and stable, no seizure episode  Patient on Valium as well  More sedated today, will check dilantin levels D/c ativan   Afib  HR  well controlled, not on rate control medication  Continue heparin to bridge with warfarin  INR goal 2-3  COPD - stable Continue nebulizer No wheezing   HTN  BP elevated, did not received BP meds  Continue Hydralazine and Labetalol PRN  Continue to monitor  Resume BP meds as able   Dysphagia  Soft diet  Esophagram was limited but not obvious mass or strictures   Urinary retention  Likely from acute illness  Medications reviewed  Added Flomax  If no improvement will place foley back in and get urological evaluation    DVT prophylaxis: Heparin ggt/Warfarin  Code Status: Full Code  Family Communication: None at bedside  Disposition Plan: CIR  ` Consultants:   PCCM   Neurology   Procedures:     Antimicrobials: Anti-infectives (From admission, onward)   Start     Dose/Rate Route Frequency Ordered Stop   02/01/17 1400  vancomycin (VANCOCIN) IVPB 750 mg/150 ml premix     750 mg 150 mL/hr over 60 Minutes Intravenous Every 12 hours 02/01/17 1251     01/30/17 0000  vancomycin (VANCOCIN) 500 mg in sodium chloride 0.9 % 100 mL IVPB  Status:  Discontinued     500 mg 100 mL/hr over 60 Minutes Intravenous Every 12 hours 01/29/17 1049 02/01/17 0912   01/29/17 1200  vancomycin (VANCOCIN) 1,250 mg in sodium chloride 0.9 % 250 mL IVPB     1,250 mg 166.7 mL/hr over 90 Minutes Intravenous  Once 01/29/17 1049 01/29/17 1328   01/27/17 1045  piperacillin-tazobactam (ZOSYN) IVPB 3.375 g     3.375 g 12.5 mL/hr over  240 Minutes Intravenous Every 8 hours 01/27/17 1033       Objective: Vitals:   02/04/17 0750 02/04/17 0752 02/04/17 0800 02/04/17 1145  BP:    (!) 192/88  Pulse:      Resp:    (!) 25  Temp:   98.9 F (37.2 C)   TempSrc:   Axillary   SpO2: 95% 95%    Weight:      Height:        Intake/Output Summary (Last 24 hours) at 02/04/2017 1219 Last data filed at 02/04/2017 0600 Gross per 24 hour  Intake -  Output 2450 ml  Net -2450 ml   Filed Weights   02/02/17 0612  02/03/17 1213 02/04/17 0432  Weight: 74.2 kg (163 lb 9.3 oz) 66.5 kg (146 lb 8 oz) 68.4 kg (150 lb 12.7 oz)    Examination:  General: Lethargic, not following commands Cardiovascular: RRR, S1/S2 +, no rubs, no gallops Respiratory: Decrease BS, bibasilar crackles  Abdominal: Soft, NT, ND, bowel sounds + Extremities: Upper extremities swollen, from IV infiltration Neuro: Not following command, lethargic responds to tactile stimuli only.    Data Reviewed: I have personally reviewed following labs and imaging studies  CBC: Recent Labs  Lab 01/31/17 0726 02/01/17 0251 02/02/17 0624 02/03/17 0502 02/04/17 0228  WBC 11.1* 8.1 9.5 11.2* 15.9*  NEUTROABS  --  6.2  --   --   --   HGB 12.8* 10.5* 11.1* 10.7* 11.3*  HCT 37.5* 30.9* 31.7* 30.3* 33.1*  MCV 92.4 92.0 90.8 91.8 90.9  PLT 256 253 315 331 451*   Basic Metabolic Panel: Recent Labs  Lab 01/29/17 0450  01/30/17 0255 01/30/17 1035 01/30/17 1905 01/31/17 0726 01/31/17 1919 01/31/17 2259 02/01/17 0251 02/01/17 1552 02/02/17 0624  NA 139  --  137  --   --  134*  --   --  135  --  133*  K 4.0  --  4.4  --   --  3.7  --   --  3.5  --  3.4*  CL 109  --  107  --   --  99*  --   --  101  --  100*  CO2 19*  --  14*  --   --  22  --   --  23  --  22  GLUCOSE 98  --  91  --   --  155*  --   --  133*  --  112*  BUN 12  --  13  --   --  11  --   --  13  --  10  CREATININE 1.14  --  1.15  --   --  1.11  --   --  1.13  --  0.91  CALCIUM 8.0*  --  8.1*  --   --  8.1*  --   --  7.7*  --  7.9*  MG  --    < > 1.7 1.9 1.8 1.8 1.7  --   --   --  1.7  PHOS  --    < > 1.7* 2.1* 1.5* 1.1* <1.0* <1.0*  --  1.5* 1.6*   < > = values in this interval not displayed.   GFR: Estimated Creatinine Clearance: 57.5 mL/min (by C-G formula based on SCr of 0.91 mg/dL). Liver Function Tests: Recent Labs  Lab 01/29/17 0450 01/30/17 1035 01/31/17 0726  AST 27 21 19   ALT 12* 11* 10*  ALKPHOS 82  77 74  BILITOT 1.4* 1.5* 1.6*  PROT 6.3* 6.5 6.3*    ALBUMIN 2.6* 2.4* 2.3*   No results for input(s): LIPASE, AMYLASE in the last 168 hours. Recent Labs  Lab 02/01/17 0251  AMMONIA 34   Coagulation Profile: Recent Labs  Lab 01/31/17 0726 02/01/17 0251 02/02/17 0624 02/03/17 0502 02/04/17 0228  INR 1.16 1.11 1.07 1.15 1.28   Cardiac Enzymes: No results for input(s): CKTOTAL, CKMB, CKMBINDEX, TROPONINI in the last 168 hours. BNP (last 3 results) No results for input(s): PROBNP in the last 8760 hours. HbA1C: No results for input(s): HGBA1C in the last 72 hours. CBG: Recent Labs  Lab 02/03/17 1620 02/03/17 1943 02/03/17 2334 02/04/17 0311 02/04/17 0813  GLUCAP 157* 126* 109* 127* 109*   Lipid Profile: No results for input(s): CHOL, HDL, LDLCALC, TRIG, CHOLHDL, LDLDIRECT in the last 72 hours. Thyroid Function Tests: No results for input(s): TSH, T4TOTAL, FREET4, T3FREE, THYROIDAB in the last 72 hours. Anemia Panel: No results for input(s): VITAMINB12, FOLATE, FERRITIN, TIBC, IRON, RETICCTPCT in the last 72 hours. Sepsis Labs: Recent Labs  Lab 01/30/17 1034 01/30/17 1035 01/30/17 1400 01/31/17 0726 02/01/17 0251  PROCALCITON  --  2.39  --  2.31 2.02  LATICACIDVEN 1.3  --  1.3  --   --     Recent Results (from the past 240 hour(s))  Culture, respiratory (NON-Expectorated)     Status: None   Collection Time: 01/27/17 12:12 AM  Result Value Ref Range Status   Specimen Description TRACHEAL ASPIRATE  Final   Special Requests NONE  Final   Gram Stain   Final    FEW WBC PRESENT, PREDOMINANTLY PMN NO SQUAMOUS EPITHELIAL CELLS SEEN FEW GRAM POSITIVE COCCI IN PAIRS RARE GRAM POSITIVE RODS    Culture Consistent with normal respiratory flora.  Final   Report Status 01/29/2017 FINAL  Final  MRSA PCR Screening     Status: None   Collection Time: 01/27/17  1:56 AM  Result Value Ref Range Status   MRSA by PCR NEGATIVE NEGATIVE Final    Comment:        The GeneXpert MRSA Assay (FDA approved for NASAL  specimens only), is one component of a comprehensive MRSA colonization surveillance program. It is not intended to diagnose MRSA infection nor to guide or monitor treatment for MRSA infections.   Culture, respiratory (NON-Expectorated)     Status: None   Collection Time: 01/28/17  7:08 AM  Result Value Ref Range Status   Specimen Description TRACHEAL ASPIRATE  Final   Special Requests Normal  Final   Gram Stain NO WBC SEEN RARE YEAST   Final   Culture FEW CANDIDA TROPICALIS  Final   Report Status 01/30/2017 FINAL  Final  Culture, blood (routine x 2)     Status: Abnormal   Collection Time: 01/28/17  7:35 AM  Result Value Ref Range Status   Specimen Description BLOOD LEFT HAND  Final   Special Requests IN PEDIATRIC BOTTLE Blood Culture adequate volume  Final   Culture  Setup Time   Final    GRAM POSITIVE COCCI IN CLUSTERS IN PEDIATRIC BOTTLE Organism ID to follow    Culture (A)  Final    STAPHYLOCOCCUS SPECIES (COAGULASE NEGATIVE) THE SIGNIFICANCE OF ISOLATING THIS ORGANISM FROM A SINGLE SET OF BLOOD CULTURES WHEN MULTIPLE SETS ARE DRAWN IS UNCERTAIN. PLEASE NOTIFY THE MICROBIOLOGY DEPARTMENT WITHIN ONE WEEK IF SPECIATION AND SENSITIVITIES ARE REQUIRED.    Report Status 01/31/2017 FINAL  Final  Blood  Culture ID Panel (Reflexed)     Status: Abnormal   Collection Time: 01/28/17  7:35 AM  Result Value Ref Range Status   Enterococcus species NOT DETECTED NOT DETECTED Final   Listeria monocytogenes NOT DETECTED NOT DETECTED Final   Staphylococcus species DETECTED (A) NOT DETECTED Final    Comment: Methicillin (oxacillin) susceptible coagulase negative staphylococcus. Possible blood culture contaminant (unless isolated from more than one blood culture draw or clinical case suggests pathogenicity). No antibiotic treatment is indicated for blood  culture contaminants. CRITICAL RESULT CALLED TO, READ BACK BY AND VERIFIED WITH: N. Batchelder Pharm.D. 10:50 01/29/17 ([email or web address])     Staphylococcus aureus NOT DETECTED NOT DETECTED Final   Methicillin resistance NOT DETECTED NOT DETECTED Final   Streptococcus species NOT DETECTED NOT DETECTED Final   Streptococcus agalactiae NOT DETECTED NOT DETECTED Final   Streptococcus pneumoniae NOT DETECTED NOT DETECTED Final   Streptococcus pyogenes NOT DETECTED NOT DETECTED Final   Acinetobacter baumannii NOT DETECTED NOT DETECTED Final   Enterobacteriaceae species NOT DETECTED NOT DETECTED Final   Enterobacter cloacae complex NOT DETECTED NOT DETECTED Final   Escherichia coli NOT DETECTED NOT DETECTED Final   Klebsiella oxytoca NOT DETECTED NOT DETECTED Final   Klebsiella pneumoniae NOT DETECTED NOT DETECTED Final   Proteus species NOT DETECTED NOT DETECTED Final   Serratia marcescens NOT DETECTED NOT DETECTED Final   Carbapenem resistance NOT DETECTED NOT DETECTED Final   Haemophilus influenzae NOT DETECTED NOT DETECTED Final   Neisseria meningitidis NOT DETECTED NOT DETECTED Final   Pseudomonas aeruginosa NOT DETECTED NOT DETECTED Final   Candida albicans NOT DETECTED NOT DETECTED Final   Candida glabrata NOT DETECTED NOT DETECTED Final   Candida krusei NOT DETECTED NOT DETECTED Final   Candida parapsilosis NOT DETECTED NOT DETECTED Final   Candida tropicalis NOT DETECTED NOT DETECTED Final  Culture, blood (routine x 2)     Status: None   Collection Time: 01/28/17  7:45 AM  Result Value Ref Range Status   Specimen Description BLOOD LEFT ANTECUBITAL  Final   Special Requests IN PEDIATRIC BOTTLE Blood Culture adequate volume  Final   Culture NO GROWTH 5 DAYS  Final   Report Status 02/02/2017 FINAL  Final  Culture, blood (Routine X 2) w Reflex to ID Panel     Status: None (Preliminary result)   Collection Time: 02/01/17 11:05 AM  Result Value Ref Range Status   Specimen Description BLOOD LEFT HAND  Final   Special Requests IN PEDIATRIC BOTTLE Blood Culture adequate volume  Final   Culture NO GROWTH 2 DAYS  Final   Report  Status PENDING  Incomplete  Culture, blood (Routine X 2) w Reflex to ID Panel     Status: None (Preliminary result)   Collection Time: 02/01/17 11:12 AM  Result Value Ref Range Status   Specimen Description BLOOD RIGHT HAND  Final   Special Requests IN PEDIATRIC BOTTLE Blood Culture adequate volume  Final   Culture NO GROWTH 2 DAYS  Final   Report Status PENDING  Incomplete      Radiology Studies: Dg Swallowing Func-speech Pathology  Result Date: 02/03/2017 Objective Swallowing Evaluation: Type of Study: MBS-Modified Barium Swallow Study  Patient Details Name: LARNCE SCHNACKENBERG MRN: [id number] Date of Birth: [birth year] Today's Date: 02/03/2017 Time: SLP Start Time (ACUTE ONLY): 1105 -SLP Stop Time (ACUTE ONLY): 1125 SLP Time Calculation (min) (ACUTE ONLY): 20 min Past Medical History: Past Medical History: Diagnosis Date . Back pain  . COPD (chronic obstructive pulmonary  disease) (HCC)  . Depression  . Fatigue   a. in setting of Afib, improved following DCCV. [name] Hearing loss  . History of GI bleed 1976 . Hyperlipemia  . Hypertension  . Migraine  . Palpitations  . Persistent atrial fibrillation (HCC)   a. 02/2016 Echo: EF 66-65%, mild MR;  b. CHA2DS2VASc = 3-->coumadin;  c. 05/2016 TEE/DCCV: EF 45-50%, triv AI, mild to mod MR, no LA/LAA/RA/RAA thrombus, mod to sev TR-->successful DCCV. . Seizure (HCC)  . Syncope   a. related to seizure disorder Past Surgical History: Past Surgical History: Procedure Laterality Date . APPENDECTOMY   . BACK SURGERY   . CARDIOVERSION N/A 06/06/2016  Procedure: CARDIOVERSION;  Surgeon: Yvonne Kendall, MD;  Location: [facility] ORS;  Service: Cardiovascular;  Laterality: N/A; . CATARACT EXTRACTION   . hemmoridectomy   . HERNIA REPAIR   . TEE WITHOUT CARDIOVERSION N/A 06/06/2016  Procedure: TRANSESOPHAGEAL ECHOCARDIOGRAM (TEE);  Surgeon: Yvonne Kendall, MD;  Location: [facility] ORS;  Service: Cardiovascular;  Laterality: N/A; HPI: 84 y.o. male with known seizure disorder admitted through ED  in respiratory distress following seizure requiring intubation. ETT 1/19-21.  Subjective: "I'm good if I get to eat." Assessment / Plan / Recommendation CHL IP CLINICAL IMPRESSIONS 02/03/2017 Clinical Impression Patient presents with mild oropharyngeal dysphagia and suspected primary esophageal dysphagia. Oral stage characterized by impaired mastication and prolonged bolus preparation due to absence of dentition. Oral containment adequate without premature spillage. Swallow initiation at the base of tongue for solids, valleculae for liquids. Pharyngeal stage characterized by mildly decreased base of tongue retraction and pharyngeal constriction. Hyolaryngeal excursion appears adequate. Decreased amplitude/duration of cricopharyngeal opening and suspected suboptimal esophageal pressure impedes full clearance resulting in residue in the valleculae and pyriform sinuses, solids>liquids. Chin tuck with dry swallow reduces residue. Occasional shallow penetration of residue after the swallow and with larger, consecutive sips of thin liquids. Pt clears with cued throat clear. Intermittent backflow through the cervical esophagus and to the pharynx. Recommend to consume dys 3 (mechanical soft) diet with thin liquids, medications one at a time with liquid, with mild-moderate risk for aspiration. Clear throat intermittently, dry swallow with chin tuck after solids to decrease residue. SLP reviewed results and recommendations with patient immediately following the exam. No prior esophageal examinations are found in chart, however pt tells SLP he was told to have his esophagus stretched (unclear when or by whom) but did not have this done. Recommend further esophageal w/u.  SLP Visit Diagnosis Dysphagia, pharyngeal phase (R13.13);Dysphagia, pharyngoesophageal phase (R13.14) Attention and concentration deficit following -- Frontal lobe and executive function deficit following -- Impact on safety and function Mild aspiration  risk;Moderate aspiration risk   CHL IP TREATMENT RECOMMENDATION 02/03/2017 Treatment Recommendations Therapy as outlined in treatment plan below   Prognosis 02/03/2017 Prognosis for Safe Diet Advancement Good Barriers to Reach Goals -- Barriers/Prognosis Comment -- CHL IP DIET RECOMMENDATION 02/03/2017 SLP Diet Recommendations Dysphagia 3 (Mech soft) solids;Thin liquid Liquid Administration via Cup;Straw Medication Administration Whole meds with liquid Compensations Slow rate;Small sips/bites;Multiple dry swallows after each bite/sip;Clear throat intermittently;Follow solids with liquid Postural Changes --   CHL IP OTHER RECOMMENDATIONS 02/03/2017 Recommended Consults Consider esophageal assessment;Consider ENT evaluation Oral Care Recommendations Oral care BID Other Recommendations --   CHL IP FOLLOW UP RECOMMENDATIONS 02/03/2017 Follow up Recommendations Skilled Nursing facility   [facility] IP FREQUENCY AND DURATION 02/03/2017 Speech Therapy Frequency (ACUTE ONLY) min 2x/week Treatment Duration 1 week      CHL IP ORAL PHASE 02/03/2017 Oral Phase Impaired Oral -  Pudding Teaspoon -- Oral - Pudding Cup -- Oral - Honey Teaspoon -- Oral - Honey Cup -- Oral - Nectar Teaspoon -- Oral - Nectar Cup -- Oral - Nectar Straw -- Oral - Thin Teaspoon -- Oral - Thin Cup -- Oral - Thin Straw -- Oral - Puree -- Oral - Mech Soft Impaired mastication Oral - Regular -- Oral - Multi-Consistency -- Oral - Pill -- Oral Phase - Comment --  CHL IP PHARYNGEAL PHASE 02/03/2017 Pharyngeal Phase Impaired Pharyngeal- Pudding Teaspoon -- Pharyngeal -- Pharyngeal- Pudding Cup -- Pharyngeal -- Pharyngeal- Honey Teaspoon -- Pharyngeal -- Pharyngeal- Honey Cup -- Pharyngeal -- Pharyngeal- Nectar Teaspoon -- Pharyngeal -- Pharyngeal- Nectar Cup -- Pharyngeal -- Pharyngeal- Nectar Straw -- Pharyngeal -- Pharyngeal- Thin Teaspoon -- Pharyngeal -- Pharyngeal- Thin Cup -- Pharyngeal -- Pharyngeal- Thin Straw Penetration/Aspiration during swallow;Delayed swallow  initiation-vallecula;Reduced pharyngeal peristalsis;Reduced tongue base retraction;Pharyngeal residue - valleculae;Pharyngeal residue - pyriform;Pharyngeal residue - cp segment;Penetration/Apiration after swallow Pharyngeal Material enters airway, remains ABOVE vocal cords and not ejected out Pharyngeal- Puree Reduced tongue base retraction;Reduced pharyngeal peristalsis;Penetration/Apiration after swallow;Pharyngeal residue - valleculae;Pharyngeal residue - pyriform;Pharyngeal residue - posterior pharnyx;Pharyngeal residue - cp segment;Other (Comment) Pharyngeal Material enters airway, remains ABOVE vocal cords and not ejected out Pharyngeal- Mechanical Soft Reduced pharyngeal peristalsis;Reduced tongue base retraction;Pharyngeal residue - valleculae;Pharyngeal residue - posterior pharnyx;Pharyngeal residue - cp segment;Pharyngeal residue - pyriform;Compensatory strategies attempted (with notebox) Pharyngeal -- Pharyngeal- Regular -- Pharyngeal -- Pharyngeal- Multi-consistency -- Pharyngeal -- Pharyngeal- Pill -- Pharyngeal -- Pharyngeal Comment --  CHL IP CERVICAL ESOPHAGEAL PHASE 02/03/2017 Cervical Esophageal Phase Impaired Pudding Teaspoon -- Pudding Cup -- Honey Teaspoon -- Honey Cup -- Nectar Teaspoon -- Nectar Cup -- Nectar Straw -- Thin Teaspoon -- Thin Cup -- Thin Straw Reduced cricopharyngeal relaxation;Prominent cricopharyngeal segment;Esophageal backflow into cervical esophagus Puree Reduced cricopharyngeal relaxation;Prominent cricopharyngeal segment;Esophageal backflow into cervical esophagus Mechanical Soft Reduced cricopharyngeal relaxation;Prominent cricopharyngeal segment Regular -- Multi-consistency -- Pill -- Cervical Esophageal Comment -- Rondel Baton, MS, CCC-SLP Speech-Language Pathologist [phone] No flowsheet data found. Arlana Lindau 02/03/2017, 12:30 PM                 Scheduled Meds: . arformoterol  15 mcg Nebulization BID  . bisoprolol  2.5 mg Oral BID  . budesonide  (PULMICORT) nebulizer solution  0.5 mg Nebulization BID  . diazepam  2 mg Oral Q12H  . hydrochlorothiazide  25 mg Oral Daily  . mouth rinse  15 mL Mouth Rinse BID  . mirtazapine  15 mg Oral QHS  . pantoprazole (PROTONIX) IV  40 mg Intravenous Daily  . phenytoin (DILANTIN) IV  100 mg Intravenous TID  . tamsulosin  0.4 mg Oral QPC breakfast  . warfarin  10 mg Oral ONCE-1800  . Warfarin - Pharmacist Dosing Inpatient   Does not apply q1800   Continuous Infusions: . sodium chloride Stopped (01/29/17 0800)  . heparin 1,650 Units/hr (02/04/17 0939)  . lactated ringers 75 mL/hr at 02/02/17 2210  . piperacillin-tazobactam 3.375 g (02/03/17 2228)  . vancomycin Stopped (02/03/17 1523)     LOS: 8 days   Time spent: Total of 25 minutes spent with pt, greater than 50% of which was spent in discussion of  treatment, counseling and coordination of care   Latrelle Dodrill, MD Pager: Text Page via [email or web address]   If 7PM-7AM, please contact night-coverage [email or web address] 02/04/2017, 12:19 PM

## 2017-02-05 LAB — BASIC METABOLIC PANEL
Anion gap: 12 (ref 5–15)
BUN: 15 mg/dL (ref 6–20)
CALCIUM: 8 mg/dL — AB (ref 8.9–10.3)
CO2: 23 mmol/L (ref 22–32)
CREATININE: 1.1 mg/dL (ref 0.61–1.24)
Chloride: 100 mmol/L — ABNORMAL LOW (ref 101–111)
GFR calc Af Amer: >60 mL/min (ref >60)
GLUCOSE: 106 mg/dL — AB (ref 65–99)
Potassium: 2.8 mmol/L — ABNORMAL LOW (ref 3.5–5.1)
Sodium: 135 mmol/L (ref 135–145)

## 2017-02-05 LAB — CBC
HCT: 29.6 % — ABNORMAL LOW (ref 39.0–52.0)
Hemoglobin: 10.2 g/dL — ABNORMAL LOW (ref 13.0–17.0)
MCH: 31.3 pg (ref 26.0–34.0)
MCHC: 34.5 g/dL (ref 30.0–36.0)
MCV: 90.8 fL (ref 78.0–100.0)
Platelets: 551 10*3/uL — ABNORMAL HIGH (ref 150–400)
RBC: 3.26 MIL/uL — AB (ref 4.22–5.81)
RDW: 14.5 % (ref 11.5–15.5)
WBC: 14.3 10*3/uL — AB (ref 4.0–10.5)

## 2017-02-05 LAB — GLUCOSE, CAPILLARY
GLUCOSE-CAPILLARY: 99 mg/dL (ref 65–99)
GLUCOSE-CAPILLARY: 105 mg/dL — AB (ref 65–99)
GLUCOSE-CAPILLARY: 165 mg/dL — AB (ref 65–99)
Glucose-Capillary: 114 mg/dL — ABNORMAL HIGH (ref 65–99)
Glucose-Capillary: 121 mg/dL — ABNORMAL HIGH (ref 65–99)
Glucose-Capillary: 120 mg/dL — ABNORMAL HIGH (ref 65–99)

## 2017-02-05 LAB — PROTIME-INR
INR: 1.49
PROTHROMBIN TIME: 17.9 s — AB (ref 11.4–15.2)

## 2017-02-05 LAB — MAGNESIUM: Magnesium: 1.9 mg/dL (ref 1.7–2.4)

## 2017-02-05 LAB — HEPARIN LEVEL (UNFRACTIONATED)
HEPARIN UNFRACTIONATED: 0.28 [IU]/mL — AB (ref 0.30–0.70)
HEPARIN UNFRACTIONATED: 0.63 [IU]/mL (ref 0.30–0.70)

## 2017-02-05 MED ORDER — POTASSIUM CHLORIDE 10 MEQ/100ML IV SOLN
10.0000 meq | INTRAVENOUS | Status: AC
  Administered 2017-02-05 (×3): 10 meq via INTRAVENOUS
  Filled 2017-02-05 (×3): qty 100

## 2017-02-05 MED ORDER — ENSURE ENLIVE PO LIQD
237.0000 mL | Freq: Three times a day (TID) | ORAL | Status: DC
  Administered 2017-02-05 – 2017-02-08 (×8): 237 mL via ORAL

## 2017-02-05 MED ORDER — PHENYTOIN SODIUM 50 MG/ML IJ SOLN
100.0000 mg | Freq: Two times a day (BID) | INTRAMUSCULAR | Status: DC
  Administered 2017-02-05: 100 mg via INTRAVENOUS
  Filled 2017-02-05 (×2): qty 2

## 2017-02-05 MED ORDER — PHENYTOIN SODIUM 50 MG/ML IJ SOLN: 100.0000 mg | Freq: Every morning | INTRAMUSCULAR | Status: DC

## 2017-02-05 MED ORDER — LACOSAMIDE 200 MG/20ML IV SOLN
200.0000 mg | Freq: Once | INTRAVENOUS | Status: AC
  Administered 2017-02-05: 200 mg via INTRAVENOUS
  Filled 2017-02-05: qty 20

## 2017-02-05 MED ORDER — LACOSAMIDE 50 MG PO TABS
200.0000 mg | ORAL_TABLET | Freq: Two times a day (BID) | ORAL | Status: DC
  Administered 2017-02-05 – 2017-02-09 (×8): 200 mg via ORAL
  Filled 2017-02-05 (×8): qty 4

## 2017-02-05 MED ORDER — WARFARIN SODIUM 10 MG PO TABS
10.0000 mg | ORAL_TABLET | Freq: Once | ORAL | Status: AC
  Administered 2017-02-05: 10 mg via ORAL
  Filled 2017-02-05: qty 1

## 2017-02-05 MED ORDER — BISOPROLOL FUMARATE 5 MG PO TABS
5.0000 mg | ORAL_TABLET | Freq: Two times a day (BID) | ORAL | Status: DC
  Administered 2017-02-05 – 2017-02-09 (×8): 5 mg via ORAL
  Filled 2017-02-05 (×8): qty 1

## 2017-02-05 NOTE — Progress Notes (Signed)
Rehab admissions - I met with patient and his wife.  Wife has 3 daughters who can assist.  Wife has a son who lives on her property in a trailer who can assist and is not currently working.  Wife would like inpatient rehab if needed.  Will need insurance approval prior to acute inpatient rehab admission.  Call me for questions.  #[phone]

## 2017-02-05 NOTE — Progress Notes (Signed)
ANTICOAGULATION CONSULT NOTE   Pharmacy Consult for Heparin/Coumadin Indication: atrial fibrillation  Allergies  Allergen Reactions  . Aspirin Other (See Comments)    CAUSES ULCERS  . Codeine Nausea And Vomiting  . Oxycodone Nausea And Vomiting   Patient Measurements: Height: 5\' 7"  (170.2 cm) Weight: 146 lb 13.2 oz (66.6 kg) IBW/kg (Calculated) : 66.1 Heparin dosing weight- 67 kg  Vital Signs: Temp: 98.2 F (36.8 C) (01/29 0830) Temp Source: Oral (01/29 0830) BP: 175/72 (01/29 0830) Pulse Rate: 96 (01/29 0830)  Labs: Recent Labs    02/03/17 0502 02/04/17 0228 02/05/17 0322 02/05/17 0754  HGB 10.7* 11.3* 10.2*  --   HCT 30.3* 33.1* 29.6*  --   PLT 331 451* 551*  --   LABPROT 14.6 15.9* 17.9*  --   INR 1.15 1.28 1.49  --   HEPARINUNFRC 0.36 0.37 0.28*  --   CREATININE  --   --   --  1.10   Estimated Creatinine Clearance: 47.6 mL/min (by C-G formula based on SCr of 1.1 mg/dL).  Medical History: Past Medical History:  Diagnosis Date  . Back pain   . COPD (chronic obstructive pulmonary disease) (HCC)   . Depression   . Fatigue    a. in setting of Afib, improved following DCCV.  [name]. Hearing loss   . History of GI bleed 1976  . Hyperlipemia   . Hypertension   . Migraine   . Palpitations   . Persistent atrial fibrillation (HCC)    a. 02/2016 Echo: EF 66-65%, mild MR;  b. CHA2DS2VASc = 3-->coumadin;  c. 05/2016 TEE/DCCV: EF 45-50%, triv AI, mild to mod MR, no LA/LAA/RA/RAA thrombus, mod to sev TR-->successful DCCV.  . Seizure (HCC)   . Syncope    a. related to seizure disorder   Assessment: 84 y/o M on warfarin PTA for afib, presents to the ED with seizures, [facility] at [facility] earlier on 1/19 for same issue, required intubation 1/20-1/21.  Pharmacy was consulted at admission to hold warfarin and heparin was started after INR < 2.  Coumadin resumed 1/25, heparin continues until INR back over 2.  Noted patient agitated 1/28 and pulled out IV, heparin remained intact.    Heparin level now at goal after rate adjustment this morning. INR trending up to 1.49. No bleeding noted, hgb down overnight but stable overall.   Home warfarin dose: 5mg  po daily   Goal of Therapy:  Heparin level 0.3-0.7 units/ml  INR 2-3 Monitor platelets by anticoagulation protocol: Yes   Plan:  -Continue heparin at 1700 units/hr -Coumadin 10 mg po x 1 tonight. -Daily heparin level, CBC, INR.  Sheppard CoilFrank Deryck Hippler PharmD., BCPS Clinical Pharmacist 02/05/2017 10:02 AM

## 2017-02-05 NOTE — Progress Notes (Signed)
Physical Therapy Treatment Patient Details Name: Zachary Peterson MRN: [id number] DOB: 1931/10/01 Today's Date: 02/05/2017    History of Present Illness 84 y.o. male admitted on 01/26/17 for acute respiratory distress s/p seizure.  CT of head was negative.  Pt was intubated 01/26/17-01/28/17.  Other significant dx: PNA, dysphasia, hypophosphatemia, bacteremia, encepholopathy, HTN and fever. Pt with significant PMH of syncope, seizure, a-fib s/p cardioversion, migraine, HTN, GIB, HOH, COPD, back pain s/p surgery, fatigue and depression.      PT Comments    Pt performed increased gait during session this pm.  Pt remains flexed and required increased assistance to keep RW close.  Plan for CIR remains appropriate.     Follow Up Recommendations  CIR     Equipment Recommendations  Rolling walker with 5" wheels;Hospital bed    Recommendations for Other Services Rehab consult     Precautions / Restrictions Precautions Precautions: Fall;Other (comment) Precaution Comments: monitor vitals/BP, seizure precautions Restrictions Weight Bearing Restrictions: No    Mobility  Bed Mobility Overal bed mobility: Needs Assistance Bed Mobility: Supine to Sit;Sit to Supine     Supine to sit: Mod assist;+2 for safety/equipment     General bed mobility comments: assist for sequecing to move LEs off bed and to lift trunk   Transfers Overall transfer level: Needs assistance Equipment used: Rolling walker (2 wheeled) Transfers: Sit to/from Stand Sit to Stand: Mod assist         General transfer comment: assist to power up into standing and assist for balance, cues for hand placement and forward weight shifting.    Ambulation/Gait Ambulation/Gait assistance: Mod assist Ambulation Distance (Feet): 25 Feet Assistive device: Rolling walker (2 wheeled) Gait Pattern/deviations: Step-through pattern;Decreased stride length;Trunk flexed   Gait velocity interpretation: Below normal speed for  age/gender General Gait Details: Cues for upper trunk control and forward gaze, desaturation to 89% on 3L, required 4L to improve greater than 90%.  Close chair follow during gait.     Stairs            Wheelchair Mobility    Modified Rankin (Stroke Patients Only)       Balance Overall balance assessment: Needs assistance   Sitting balance-Leahy Scale: Poor Sitting balance - Comments: Pt looses balance to the Right    Standing balance support: Bilateral upper extremity supported Standing balance-Leahy Scale: Fair Standing balance comment: mod A for balance                             Cognition Arousal/Alertness: Awake/alert Behavior During Therapy: WFL for tasks assessed/performed Overall Cognitive Status: Within Functional Limits for tasks assessed Area of Impairment: Orientation;Attention;Memory;Following commands;Safety/judgement;Awareness;Problem solving                   Current Attention Level: Sustained   Following Commands: Follows one step commands consistently Safety/Judgement: Decreased awareness of safety;Decreased awareness of deficits Awareness: Intellectual Problem Solving: Slow processing;Decreased initiation;Difficulty sequencing;Requires verbal cues;Requires tactile cues General Comments: Pt alert and oriented during session and followed commands well.        Exercises      General Comments        Pertinent Vitals/Pain Pain Assessment: No/denies pain    Home Living                      Prior Function            PT Goals (current goals can  now be found in the care plan section) Acute Rehab PT Goals Patient Stated Goal: to regain independence Potential to Achieve Goals: Good Progress towards PT goals: Progressing toward goals    Frequency    Min 3X/week      PT Plan Current plan remains appropriate    Co-evaluation              AM-PAC PT "6 Clicks" Daily Activity  Outcome Measure   Difficulty turning over in bed (including adjusting bedclothes, sheets and blankets)?: Unable Difficulty moving from lying on back to sitting on the side of the bed? : Unable Difficulty sitting down on and standing up from a chair with arms (e.g., wheelchair, bedside commode, etc,.)?: Unable Help needed moving to and from a bed to chair (including a wheelchair)?: A Lot Help needed walking in hospital room?: A Lot Help needed climbing 3-5 steps with a railing? : Total 6 Click Score: 8    End of Session Equipment Utilized During Treatment: Oxygen;Gait belt(3-4L) Activity Tolerance: Patient limited by fatigue Patient left: with family/visitor present;in bed;with call bell/phone within reach;with nursing/sitter in room Nurse Communication: Mobility status PT Visit Diagnosis: Muscle weakness (generalized) (M62.81);Difficulty in walking, not elsewhere classified (R26.2)     Time: 1610-9604 PT Time Calculation (min) (ACUTE ONLY): 26 min  Charges:  $Gait Training: 8-22 mins $Therapeutic Activity: 8-22 mins                    G Codes:       Joycelyn Rua, PTA pager [phone]    Florestine Avers 02/05/2017, 5:24 PM

## 2017-02-05 NOTE — Progress Notes (Signed)
ANTICOAGULATION CONSULT NOTE - Follow Up Consult  Pharmacy Consult for heparin Indication: atrial fibrillation  Labs: Recent Labs    02/02/17 0624  02/03/17 0502 02/04/17 0228 02/05/17 0322  HGB 11.1*  --  10.7* 11.3* 10.2*  HCT 31.7*  --  30.3* 33.1* 29.6*  PLT 315  --  331 451* 551*  LABPROT 13.8  --  14.6 15.9* 17.9*  INR 1.07  --  1.15 1.28 1.49  HEPARINUNFRC 0.24*   < > 0.36 0.37 0.28*  CREATININE 0.91  --   --   --   --    < > = values in this interval not displayed.    Assessment: 84yo male subtherapeutic on heparin after two levels at lower end of goal; no gtt issues overnight per RN.  Goal of Therapy:  Heparin level 0.3-0.7 units/ml   Plan:  Will increase heparin gtt by 1-2 units/kg/hr to 1750 units/hr and check level in 8 hours.   Vernard GamblesVeronda Diahann Guajardo, PharmD, BCPS  02/05/2017,5:11 AM

## 2017-02-05 NOTE — Progress Notes (Signed)
Nutrition Follow-up  DOCUMENTATION CODES:   Not applicable  INTERVENTION:   -Continue Magic Cup TID with meals -Ensure Enlive po TID, each supplement provides 350 kcal and 20 grams of protein -Pt with no documented BM since 01/27/17; consider addition of bowel regimen  NUTRITION DIAGNOSIS:   Inadequate oral intake related to lethargy/confusion as evidenced by meal completion < 25%.  Ongoing  GOAL:   Patient will meet greater than or equal to 90% of their needs  Progressing  MONITOR:   PO intake, Supplement acceptance, Diet advancement, Labs, Weight trends, Skin, I & O's  REASON FOR ASSESSMENT:   Consult Enteral/tube feeding initiation and management  ASSESSMENT:   84 year old male presents to ED s/p 6-10 witnessed seizures with progressive hypoxia and concern for aspiration.  Case discussed with RN and SLP, who both confirm that pt was advanced to a dysphagia 3 diet with thin liquids yesterday.   Pt sleeping at time of visit. Spoke with pt wife at bedside, who reports that pt's mentation and intake has improved. Pt still is not eating as much as he was PTA. Pt wife estimates that he is consuming about 25% of meals. He is tolerating new diet texture well.  Discussed with pt wife addition of supplements to assist with nutritional needs; amenable to Ensure as pt wife will occasionally consume Boost at home.   Per CIR admissions coordinator, potential to d/c to CIR once medically stable.   Labs reviewed: K: 2.8, CBGS: 99-114.   Diet Order:  Aspiration precautions Seizure precautions DIET DYS 3 Room service appropriate? Yes; Fluid consistency: Thin  EDUCATION NEEDS:   Education needs have been addressed  Skin:  Skin Assessment: Reviewed RN Assessment  Last BM:  01/27/17  Height:   Ht Readings from Last 1 Encounters:  01/27/17 5\' 7"  (1.702 m)    Weight:   Wt Readings from Last 1 Encounters:  02/05/17 146 lb 13.2 oz (66.6 kg)    Ideal Body Weight:  62.3  kg  BMI:  Body mass index is 23 kg/m.  Estimated Nutritional Needs:   Kcal:  1600-1800  Protein:  75-90 grams  Fluid:  1.6-1.8 L    Blair Mesina A. Mayford KnifeWilliams, RD, LDN, CDE Pager: [phone] After hours Pager: [phone]

## 2017-02-05 NOTE — Progress Notes (Addendum)
PROGRESS NOTE [facility]   Zachary Peterson   RUE:[id number] DOB: 1931/04/28  DOA: 01/26/2017 PCP: Ignacia Palma., MD   Brief Narrative:  Zachary Peterson is an 84 year old male with known seizure disorder, COPD, hypertension, A. fib, and history of GI bleed presented to the emergency department with acute respiratory distress after sustaining a seizure.  After EMS was called patient had another witnessed tonic-clonic seizure, he was intubated for airway protection and he was admitted to the ICU.  Neurology was consulted and patient was loaded with Keppra, and continued on Dilantin. Patient was successfully extubated on 1/21, fail swallow eval when for modified barium and recommended mechanical soft diet.    Subjective: Patient seen and examined, he is up having breakfast by him self. Feels slight SOB, no chest pain. No acute events overnight. Remains afebrile   Assessment & Plan: Acute metabolic encephalopathy - Significantly improved  Multifactorial - seizures, Pneumonia, hypoxia, hospital delirium  Treat underlying causes AVOID ATIVAN as this make delirium worse  Possible sundowning - Continue Risperidone 0.5 mg BID  If agitated can use Haldol  Keep room bright  Outbed as tolerated  PT recommending CIR pending insurance approval   Acute respiratory failure with hypoxia due to aspiration PNA - stable  Wean off O2 as tolerated keep O2 sats between 89-92%  Continue Zosyn, patient was on Vancomycin due to ? Bacteremia but only 1/2 bottle grew coagulase negative, likely to be contaminant. d/c Vancomycin  Completed 9 days course of Zosyn for aspiration, will continue to monitor for now  Repeated blood cultures no growth so far Felt that fluid overload also contributed, patient was treated with Lasix   Seizures - seizures free  On Dilantin, level are subtherapeutic this is going to be difficult to manage as patient is on Warfarin due to Afib  Case discussed with neurology  recommending start Vimpat, load 200 mg IV then 200 mg BID.  Will taper dilantin over the next 2 days Patient was not able to tolerate Keppra due to lethargy.  Patient on Valium as well  D/c ativan   Afib  HR well controlled, on BB  Continue heparin to bridge with warfarin per pharmacy  INR continues to be subtherapeutic - difficult to control given interaction with Dilantin  INR goal 2-3 Maybe in the long term he can be transitioned to NOA as Dilantin will be d/ced, defer to cardiology as outpatient   COPD - stable Continue nebulizer No wheezing   HTN  BP remains slight elevated Increase bisoprolol to 5 mg BID  Continue Hydralazine and Labetalol PRN  Continue to monitor  Resume BP meds as able   Dysphagia  Soft diet  Esophagram was limited but not obvious mass or strictures   Urinary retention - some urine this morning on condom cath  Likely from acute illness  Medications reviewed  Continue Flomax  If signs of retention again will place foley again consider urological consult   Hypokalemia  Replete Check in AM   DVT prophylaxis: Heparin ggt/Warfarin  Code Status: Full Code  Family Communication: None at bedside  Disposition Plan: CIR hopefully in 2-3 days  ` Consultants:   PCCM   Neurology   Procedures:     Antimicrobials: Anti-infectives (From admission, onward)   Start     Dose/Rate Route Frequency Ordered Stop   02/01/17 1400  vancomycin (VANCOCIN) IVPB 750 mg/150 ml premix  Status:  Discontinued     750 mg 150 mL/hr over 60 Minutes  Intravenous Every 12 hours 02/01/17 1251 02/04/17 1314   01/30/17 0000  vancomycin (VANCOCIN) 500 mg in sodium chloride 0.9 % 100 mL IVPB  Status:  Discontinued     500 mg 100 mL/hr over 60 Minutes Intravenous Every 12 hours 01/29/17 1049 02/01/17 0912   01/29/17 1200  vancomycin (VANCOCIN) 1,250 mg in sodium chloride 0.9 % 250 mL IVPB     1,250 mg 166.7 mL/hr over 90 Minutes Intravenous  Once 01/29/17 1049 01/29/17 1328    01/27/17 1045  piperacillin-tazobactam (ZOSYN) IVPB 3.375 g  Status:  Discontinued     3.375 g 12.5 mL/hr over 240 Minutes Intravenous Every 8 hours 01/27/17 1033 02/04/17 1314     Objective: Vitals:   02/04/17 2305 02/05/17 0000 02/05/17 0340 02/05/17 0753  BP: (!) 136/47 (!) 143/46 (!) 147/46   Pulse:      Resp: (!) 24 18 (!) 21   Temp: 98.7 F (37.1 C)  98.8 F (37.1 C)   TempSrc: Oral  Oral   SpO2: 92% 92% 91% 94%  Weight:   66.6 kg (146 lb 13.2 oz)   Height:        Intake/Output Summary (Last 24 hours) at 02/05/2017 0848 Last data filed at 02/05/2017 0800 Gross per 24 hour  Intake 905.75 ml  Output 2600 ml  Net -1694.25 ml   Filed Weights   02/03/17 1213 02/04/17 0432 02/05/17 0340  Weight: 66.5 kg (146 lb 8 oz) 68.4 kg (150 lb 12.7 oz) 66.6 kg (146 lb 13.2 oz)    Examination:  General: NAD, up having breakfast  Cardiovascular: RRR, S1/S2 + Respiratory: Decrease breath sounds b/l, mild bibasilar rales  Abdominal: Soft, NT, ND, bowel sounds Extremities: no edema  Data Reviewed: I have personally reviewed following labs and imaging studies  CBC: Recent Labs  Lab 02/01/17 0251 02/02/17 0624 02/03/17 0502 02/04/17 0228 02/05/17 0322  WBC 8.1 9.5 11.2* 15.9* 14.3*  NEUTROABS 6.2  --   --   --   --   HGB 10.5* 11.1* 10.7* 11.3* 10.2*  HCT 30.9* 31.7* 30.3* 33.1* 29.6*  MCV 92.0 90.8 91.8 90.9 90.8  PLT 253 315 331 451* 551*   Basic Metabolic Panel: Recent Labs  Lab 01/30/17 0255 01/30/17 1035 01/30/17 1905 01/31/17 0726 01/31/17 1919 01/31/17 2259 02/01/17 0251 02/01/17 1552 02/02/17 0624  NA 137  --   --  134*  --   --  135  --  133*  K 4.4  --   --  3.7  --   --  3.5  --  3.4*  CL 107  --   --  99*  --   --  101  --  100*  CO2 14*  --   --  22  --   --  23  --  22  GLUCOSE 91  --   --  155*  --   --  133*  --  112*  BUN 13  --   --  11  --   --  13  --  10  CREATININE 1.15  --   --  1.11  --   --  1.13  --  0.91  CALCIUM 8.1*  --   --   8.1*  --   --  7.7*  --  7.9*  MG 1.7 1.9 1.8 1.8 1.7  --   --   --  1.7  PHOS 1.7* 2.1* 1.5* 1.1* <1.0* <1.0*  --  1.5* 1.6*  GFR: Estimated Creatinine Clearance: 57.5 mL/min (by C-G formula based on SCr of 0.91 mg/dL). Liver Function Tests: Recent Labs  Lab 01/30/17 1035 01/31/17 0726  AST 21 19  ALT 11* 10*  ALKPHOS 77 74  BILITOT 1.5* 1.6*  PROT 6.5 6.3*  ALBUMIN 2.4* 2.3*   No results for input(s): LIPASE, AMYLASE in the last 168 hours. Recent Labs  Lab 02/01/17 0251  AMMONIA 34   Coagulation Profile: Recent Labs  Lab 02/01/17 0251 02/02/17 0624 02/03/17 0502 02/04/17 0228 02/05/17 0322  INR 1.11 1.07 1.15 1.28 1.49   Cardiac Enzymes: No results for input(s): CKTOTAL, CKMB, CKMBINDEX, TROPONINI in the last 168 hours. BNP (last 3 results) No results for input(s): PROBNP in the last 8760 hours. HbA1C: No results for input(s): HGBA1C in the last 72 hours. CBG: Recent Labs  Lab 02/04/17 1739 02/04/17 1944 02/04/17 2309 02/05/17 0348 02/05/17 0733  GLUCAP 122* 170* 120* 99 105*   Lipid Profile: No results for input(s): CHOL, HDL, LDLCALC, TRIG, CHOLHDL, LDLDIRECT in the last 72 hours. Thyroid Function Tests: No results for input(s): TSH, T4TOTAL, FREET4, T3FREE, THYROIDAB in the last 72 hours. Anemia Panel: No results for input(s): VITAMINB12, FOLATE, FERRITIN, TIBC, IRON, RETICCTPCT in the last 72 hours. Sepsis Labs: Recent Labs  Lab 01/30/17 1034 01/30/17 1035 01/30/17 1400 01/31/17 0726 02/01/17 0251  PROCALCITON  --  2.39  --  2.31 2.02  LATICACIDVEN 1.3  --  1.3  --   --     Recent Results (from the past 240 hour(s))  Culture, respiratory (NON-Expectorated)     Status: None   Collection Time: 01/27/17 12:12 AM  Result Value Ref Range Status   Specimen Description TRACHEAL ASPIRATE  Final   Special Requests NONE  Final   Gram Stain   Final    FEW WBC PRESENT, PREDOMINANTLY PMN NO SQUAMOUS EPITHELIAL CELLS SEEN FEW GRAM POSITIVE COCCI  IN PAIRS RARE GRAM POSITIVE RODS    Culture Consistent with normal respiratory flora.  Final   Report Status 01/29/2017 FINAL  Final  MRSA PCR Screening     Status: None   Collection Time: 01/27/17  1:56 AM  Result Value Ref Range Status   MRSA by PCR NEGATIVE NEGATIVE Final    Comment:        The GeneXpert MRSA Assay (FDA approved for NASAL specimens only), is one component of a comprehensive MRSA colonization surveillance program. It is not intended to diagnose MRSA infection nor to guide or monitor treatment for MRSA infections.   Culture, respiratory (NON-Expectorated)     Status: None   Collection Time: 01/28/17  7:08 AM  Result Value Ref Range Status   Specimen Description TRACHEAL ASPIRATE  Final   Special Requests Normal  Final   Gram Stain NO WBC SEEN RARE YEAST   Final   Culture FEW CANDIDA TROPICALIS  Final   Report Status 01/30/2017 FINAL  Final  Culture, blood (routine x 2)     Status: Abnormal   Collection Time: 01/28/17  7:35 AM  Result Value Ref Range Status   Specimen Description BLOOD LEFT HAND  Final   Special Requests IN PEDIATRIC BOTTLE Blood Culture adequate volume  Final   Culture  Setup Time   Final    GRAM POSITIVE COCCI IN CLUSTERS IN PEDIATRIC BOTTLE Organism ID to follow    Culture (A)  Final    STAPHYLOCOCCUS SPECIES (COAGULASE NEGATIVE) THE SIGNIFICANCE OF ISOLATING THIS ORGANISM FROM A SINGLE SET OF BLOOD CULTURES WHEN  MULTIPLE SETS ARE DRAWN IS UNCERTAIN. PLEASE NOTIFY THE MICROBIOLOGY DEPARTMENT WITHIN ONE WEEK IF SPECIATION AND SENSITIVITIES ARE REQUIRED.    Report Status 01/31/2017 FINAL  Final  Blood Culture ID Panel (Reflexed)     Status: Abnormal   Collection Time: 01/28/17  7:35 AM  Result Value Ref Range Status   Enterococcus species NOT DETECTED NOT DETECTED Final   Listeria monocytogenes NOT DETECTED NOT DETECTED Final   Staphylococcus species DETECTED (A) NOT DETECTED Final    Comment: Methicillin (oxacillin) susceptible  coagulase negative staphylococcus. Possible blood culture contaminant (unless isolated from more than one blood culture draw or clinical case suggests pathogenicity). No antibiotic treatment is indicated for blood  culture contaminants. CRITICAL RESULT CALLED TO, READ BACK BY AND VERIFIED WITH: N. Batchelder Pharm.D. 10:50 01/29/17 ([email or web address])    Staphylococcus aureus NOT DETECTED NOT DETECTED Final   Methicillin resistance NOT DETECTED NOT DETECTED Final   Streptococcus species NOT DETECTED NOT DETECTED Final   Streptococcus agalactiae NOT DETECTED NOT DETECTED Final   Streptococcus pneumoniae NOT DETECTED NOT DETECTED Final   Streptococcus pyogenes NOT DETECTED NOT DETECTED Final   Acinetobacter baumannii NOT DETECTED NOT DETECTED Final   Enterobacteriaceae species NOT DETECTED NOT DETECTED Final   Enterobacter cloacae complex NOT DETECTED NOT DETECTED Final   Escherichia coli NOT DETECTED NOT DETECTED Final   Klebsiella oxytoca NOT DETECTED NOT DETECTED Final   Klebsiella pneumoniae NOT DETECTED NOT DETECTED Final   Proteus species NOT DETECTED NOT DETECTED Final   Serratia marcescens NOT DETECTED NOT DETECTED Final   Carbapenem resistance NOT DETECTED NOT DETECTED Final   Haemophilus influenzae NOT DETECTED NOT DETECTED Final   Neisseria meningitidis NOT DETECTED NOT DETECTED Final   Pseudomonas aeruginosa NOT DETECTED NOT DETECTED Final   Candida albicans NOT DETECTED NOT DETECTED Final   Candida glabrata NOT DETECTED NOT DETECTED Final   Candida krusei NOT DETECTED NOT DETECTED Final   Candida parapsilosis NOT DETECTED NOT DETECTED Final   Candida tropicalis NOT DETECTED NOT DETECTED Final  Culture, blood (routine x 2)     Status: None   Collection Time: 01/28/17  7:45 AM  Result Value Ref Range Status   Specimen Description BLOOD LEFT ANTECUBITAL  Final   Special Requests IN PEDIATRIC BOTTLE Blood Culture adequate volume  Final   Culture NO GROWTH 5 DAYS  Final   Report Status  02/02/2017 FINAL  Final  Culture, blood (Routine X 2) w Reflex to ID Panel     Status: None (Preliminary result)   Collection Time: 02/01/17 11:05 AM  Result Value Ref Range Status   Specimen Description BLOOD LEFT HAND  Final   Special Requests IN PEDIATRIC BOTTLE Blood Culture adequate volume  Final   Culture NO GROWTH 3 DAYS  Final   Report Status PENDING  Incomplete  Culture, blood (Routine X 2) w Reflex to ID Panel     Status: None (Preliminary result)   Collection Time: 02/01/17 11:12 AM  Result Value Ref Range Status   Specimen Description BLOOD RIGHT HAND  Final   Special Requests IN PEDIATRIC BOTTLE Blood Culture adequate volume  Final   Culture NO GROWTH 3 DAYS  Final   Report Status PENDING  Incomplete      Radiology Studies: Dg Esophagus  Result Date: 02/04/2017 CLINICAL DATA:  Possible esophageal stricture EXAM: ESOPHOGRAM/BARIUM SWALLOW TECHNIQUE: Single contrast examination was performed using  thin barium. FLUOROSCOPY TIME:  Fluoroscopy Time:  2 minutes 24 seconds Radiation Exposure Index (if provided by  the fluoroscopic device): 13.2 mGy Number of Acquired Spot Images: 5 COMPARISON:  None. FINDINGS: Markedly limited evaluation due to patient's inability to follow directions. Only occasional swallows of contrast passed into the esophagus. No large esophageal mass. No definite esophageal narrowing or stricture. IMPRESSION: Markedly limited evaluation due to the patient's inability to follow directions. No large esophageal mass or definite esophageal narrowing or stricture. These results were called by telephone at the time of interpretation on 02/04/2017 at 12:27 pm to Dr. Lenox Ponds, who verbally acknowledged these results. Electronically Signed   By: Charline Bills M.D.   On: 02/04/2017 12:37   Dg Swallowing Func-speech Pathology  Result Date: 02/03/2017 Objective Swallowing Evaluation: Type of Study: MBS-Modified Barium Swallow Study  Patient Details Name: JAYR LUPERCIO MRN: [id number] Date of Birth: May 08, 1931 Today's Date: 02/03/2017 Time: SLP Start Time (ACUTE ONLY): 1105 -SLP Stop Time (ACUTE ONLY): 1125 SLP Time Calculation (min) (ACUTE ONLY): 20 min Past Medical History: Past Medical History: Diagnosis Date . Back pain  . COPD (chronic obstructive pulmonary disease) (HCC)  . Depression  . Fatigue   a. in setting of Afib, improved following DCCV. [name] Hearing loss  . History of GI bleed 1976 . Hyperlipemia  . Hypertension  . Migraine  . Palpitations  . Persistent atrial fibrillation (HCC)   a. 02/2016 Echo: EF 66-65%, mild MR;  b. CHA2DS2VASc = 3-->coumadin;  c. 05/2016 TEE/DCCV: EF 45-50%, triv AI, mild to mod MR, no LA/LAA/RA/RAA thrombus, mod to sev TR-->successful DCCV. . Seizure (HCC)  . Syncope   a. related to seizure disorder Past Surgical History: Past Surgical History: Procedure Laterality Date . APPENDECTOMY   . BACK SURGERY   . CARDIOVERSION N/A 06/06/2016  Procedure: CARDIOVERSION;  Surgeon: Yvonne Kendall, MD;  Location: [facility] ORS;  Service: Cardiovascular;  Laterality: N/A; . CATARACT EXTRACTION   . hemmoridectomy   . HERNIA REPAIR   . TEE WITHOUT CARDIOVERSION N/A 06/06/2016  Procedure: TRANSESOPHAGEAL ECHOCARDIOGRAM (TEE);  Surgeon: Yvonne Kendall, MD;  Location: [facility] ORS;  Service: Cardiovascular;  Laterality: N/A; HPI: 84 y.o. male with known seizure disorder admitted through ED in respiratory distress following seizure requiring intubation. ETT 1/19-21.  Subjective: "I'm good if I get to eat." Assessment / Plan / Recommendation CHL IP CLINICAL IMPRESSIONS 02/03/2017 Clinical Impression Patient presents with mild oropharyngeal dysphagia and suspected primary esophageal dysphagia. Oral stage characterized by impaired mastication and prolonged bolus preparation due to absence of dentition. Oral containment adequate without premature spillage. Swallow initiation at the base of tongue for solids, valleculae for liquids. Pharyngeal stage characterized by mildly  decreased base of tongue retraction and pharyngeal constriction. Hyolaryngeal excursion appears adequate. Decreased amplitude/duration of cricopharyngeal opening and suspected suboptimal esophageal pressure impedes full clearance resulting in residue in the valleculae and pyriform sinuses, solids>liquids. Chin tuck with dry swallow reduces residue. Occasional shallow penetration of residue after the swallow and with larger, consecutive sips of thin liquids. Pt clears with cued throat clear. Intermittent backflow through the cervical esophagus and to the pharynx. Recommend to consume dys 3 (mechanical soft) diet with thin liquids, medications one at a time with liquid, with mild-moderate risk for aspiration. Clear throat intermittently, dry swallow with chin tuck after solids to decrease residue. SLP reviewed results and recommendations with patient immediately following the exam. No prior esophageal examinations are found in chart, however pt tells SLP he was told to have his esophagus stretched (unclear when or by whom) but did not have this done. Recommend further esophageal w/u.  SLP Visit Diagnosis Dysphagia, pharyngeal phase (R13.13);Dysphagia, pharyngoesophageal phase (R13.14) Attention and concentration deficit following -- Frontal lobe and executive function deficit following -- Impact on safety and function Mild aspiration risk;Moderate aspiration risk   CHL IP TREATMENT RECOMMENDATION 02/03/2017 Treatment Recommendations Therapy as outlined in treatment plan below   Prognosis 02/03/2017 Prognosis for Safe Diet Advancement Good Barriers to Reach Goals -- Barriers/Prognosis Comment -- CHL IP DIET RECOMMENDATION 02/03/2017 SLP Diet Recommendations Dysphagia 3 (Mech soft) solids;Thin liquid Liquid Administration via Cup;Straw Medication Administration Whole meds with liquid Compensations Slow rate;Small sips/bites;Multiple dry swallows after each bite/sip;Clear throat intermittently;Follow solids with liquid  Postural Changes --   CHL IP OTHER RECOMMENDATIONS 02/03/2017 Recommended Consults Consider esophageal assessment;Consider ENT evaluation Oral Care Recommendations Oral care BID Other Recommendations --   CHL IP FOLLOW UP RECOMMENDATIONS 02/03/2017 Follow up Recommendations Skilled Nursing facility   [facility] IP FREQUENCY AND DURATION 02/03/2017 Speech Therapy Frequency (ACUTE ONLY) min 2x/week Treatment Duration 1 week      CHL IP ORAL PHASE 02/03/2017 Oral Phase Impaired Oral - Pudding Teaspoon -- Oral - Pudding Cup -- Oral - Honey Teaspoon -- Oral - Honey Cup -- Oral - Nectar Teaspoon -- Oral - Nectar Cup -- Oral - Nectar Straw -- Oral - Thin Teaspoon -- Oral - Thin Cup -- Oral - Thin Straw -- Oral - Puree -- Oral - Mech Soft Impaired mastication Oral - Regular -- Oral - Multi-Consistency -- Oral - Pill -- Oral Phase - Comment --  CHL IP PHARYNGEAL PHASE 02/03/2017 Pharyngeal Phase Impaired Pharyngeal- Pudding Teaspoon -- Pharyngeal -- Pharyngeal- Pudding Cup -- Pharyngeal -- Pharyngeal- Honey Teaspoon -- Pharyngeal -- Pharyngeal- Honey Cup -- Pharyngeal -- Pharyngeal- Nectar Teaspoon -- Pharyngeal -- Pharyngeal- Nectar Cup -- Pharyngeal -- Pharyngeal- Nectar Straw -- Pharyngeal -- Pharyngeal- Thin Teaspoon -- Pharyngeal -- Pharyngeal- Thin Cup -- Pharyngeal -- Pharyngeal- Thin Straw Penetration/Aspiration during swallow;Delayed swallow initiation-vallecula;Reduced pharyngeal peristalsis;Reduced tongue base retraction;Pharyngeal residue - valleculae;Pharyngeal residue - pyriform;Pharyngeal residue - cp segment;Penetration/Apiration after swallow Pharyngeal Material enters airway, remains ABOVE vocal cords and not ejected out Pharyngeal- Puree Reduced tongue base retraction;Reduced pharyngeal peristalsis;Penetration/Apiration after swallow;Pharyngeal residue - valleculae;Pharyngeal residue - pyriform;Pharyngeal residue - posterior pharnyx;Pharyngeal residue - cp segment;Other (Comment) Pharyngeal Material enters airway,  remains ABOVE vocal cords and not ejected out Pharyngeal- Mechanical Soft Reduced pharyngeal peristalsis;Reduced tongue base retraction;Pharyngeal residue - valleculae;Pharyngeal residue - posterior pharnyx;Pharyngeal residue - cp segment;Pharyngeal residue - pyriform;Compensatory strategies attempted (with notebox) Pharyngeal -- Pharyngeal- Regular -- Pharyngeal -- Pharyngeal- Multi-consistency -- Pharyngeal -- Pharyngeal- Pill -- Pharyngeal -- Pharyngeal Comment --  CHL IP CERVICAL ESOPHAGEAL PHASE 02/03/2017 Cervical Esophageal Phase Impaired Pudding Teaspoon -- Pudding Cup -- Honey Teaspoon -- Honey Cup -- Nectar Teaspoon -- Nectar Cup -- Nectar Straw -- Thin Teaspoon -- Thin Cup -- Thin Straw Reduced cricopharyngeal relaxation;Prominent cricopharyngeal segment;Esophageal backflow into cervical esophagus Puree Reduced cricopharyngeal relaxation;Prominent cricopharyngeal segment;Esophageal backflow into cervical esophagus Mechanical Soft Reduced cricopharyngeal relaxation;Prominent cricopharyngeal segment Regular -- Multi-consistency -- Pill -- Cervical Esophageal Comment -- Rondel Baton, MS, CCC-SLP Speech-Language Pathologist [phone] No flowsheet data found. Arlana Lindau 02/03/2017, 12:30 PM                 Scheduled Meds: . arformoterol  15 mcg Nebulization BID  . bisoprolol  2.5 mg Oral BID  . budesonide (PULMICORT) nebulizer solution  0.5 mg Nebulization BID  . diazepam  2 mg Oral Q12H  . hydrochlorothiazide  25 mg Oral Daily  . mouth rinse  15 mL Mouth Rinse BID  . mirtazapine  15 mg Oral QHS  . pantoprazole (PROTONIX) IV  40 mg Intravenous Daily  . phenytoin (DILANTIN) IV  100 mg Intravenous TID  . risperiDONE  0.5 mg Oral BID  . tamsulosin  0.4 mg Oral QPC breakfast  . Warfarin - Pharmacist Dosing Inpatient   Does not apply q1800   Continuous Infusions: . sodium chloride Stopped (01/29/17 0800)  . heparin 1,750 Units/hr (02/05/17 0511)     LOS: 9 days   Time spent: Total of  25 minutes spent with pt, greater than 50% of which was spent in discussion of  treatment, counseling and coordination of care   Latrelle Dodrill, MD Pager: Text Page via [email or web address]   If 7PM-7AM, please contact night-coverage [email or web address] 02/05/2017, 8:48 AM

## 2017-02-06 LAB — GLUCOSE, CAPILLARY
GLUCOSE-CAPILLARY: 108 mg/dL — AB (ref 65–99)
GLUCOSE-CAPILLARY: 99 mg/dL (ref 65–99)
GLUCOSE-CAPILLARY: 109 mg/dL — AB (ref 65–99)
GLUCOSE-CAPILLARY: 171 mg/dL — AB (ref 65–99)
Glucose-Capillary: 124 mg/dL — ABNORMAL HIGH (ref 65–99)
Glucose-Capillary: 128 mg/dL — ABNORMAL HIGH (ref 65–99)

## 2017-02-06 LAB — MAGNESIUM: Magnesium: 1.8 mg/dL (ref 1.7–2.4)

## 2017-02-06 LAB — CBC
HCT: 29.7 % — ABNORMAL LOW (ref 39.0–52.0)
Hemoglobin: 10.6 g/dL — ABNORMAL LOW (ref 13.0–17.0)
MCH: 32.2 pg (ref 26.0–34.0)
MCHC: 35.7 g/dL (ref 30.0–36.0)
MCV: 90.3 fL (ref 78.0–100.0)
Platelets: 567 10*3/uL — ABNORMAL HIGH (ref 150–400)
RBC: 3.29 MIL/uL — AB (ref 4.22–5.81)
RDW: 14.7 % (ref 11.5–15.5)
WBC: 15.6 10*3/uL — ABNORMAL HIGH (ref 4.0–10.5)

## 2017-02-06 LAB — BASIC METABOLIC PANEL
ANION GAP: 12 (ref 5–15)
BUN: 13 mg/dL (ref 6–20)
CHLORIDE: 97 mmol/L — AB (ref 101–111)
CO2: 23 mmol/L (ref 22–32)
Calcium: 8 mg/dL — ABNORMAL LOW (ref 8.9–10.3)
Creatinine, Ser: 1 mg/dL (ref 0.61–1.24)
GFR calc non Af Amer: >60 mL/min (ref >60)
Glucose, Bld: 115 mg/dL — ABNORMAL HIGH (ref 65–99)
POTASSIUM: 3.2 mmol/L — AB (ref 3.5–5.1)
Sodium: 132 mmol/L — ABNORMAL LOW (ref 135–145)

## 2017-02-06 LAB — HEPARIN LEVEL (UNFRACTIONATED)
HEPARIN UNFRACTIONATED: 0.64 [IU]/mL (ref 0.30–0.70)
Heparin Unfractionated: 0.2 IU/mL — ABNORMAL LOW (ref 0.30–0.70)

## 2017-02-06 LAB — CULTURE, BLOOD (ROUTINE X 2)
CULTURE: NO GROWTH
Culture: NO GROWTH
SPECIAL REQUESTS: ADEQUATE
SPECIAL REQUESTS: ADEQUATE

## 2017-02-06 LAB — PROTIME-INR
INR: 1.69
Prothrombin Time: 19.7 seconds — ABNORMAL HIGH (ref 11.4–15.2)

## 2017-02-06 MED ORDER — WARFARIN SODIUM 2.5 MG PO TABS
12.5000 mg | ORAL_TABLET | Freq: Once | ORAL | Status: AC
  Administered 2017-02-06: 17:00:00 12.5 mg via ORAL
  Filled 2017-02-06: qty 1

## 2017-02-06 MED ORDER — POTASSIUM CHLORIDE CRYS ER 20 MEQ PO TBCR
40.0000 meq | EXTENDED_RELEASE_TABLET | Freq: Once | ORAL | Status: AC
  Administered 2017-02-06: 40 meq via ORAL
  Filled 2017-02-06: qty 2

## 2017-02-06 NOTE — Progress Notes (Signed)
Rehab admissions - I have received a denial from [employer] insurance for acute inpatient rehab admission.  [facility]  I will discuss with family and MD in am.  Call me for questions.  #[phone]

## 2017-02-06 NOTE — Progress Notes (Addendum)
  Speech Language Pathology Treatment: Dysphagia  Patient Details Name: Zachary RavelSamuel D Neault MRN: [id number] DOB: 1931-09-01 Today's Date: 02/06/2017 Time: [id number] SLP Time Calculation (min) (ACUTE ONLY): 12 min  Assessment / Plan / Recommendation Clinical Impression  Today he required max verbal cues to recall and implement strategies (double swallows while tucking chin after solids, clear throat intermittently). Re-educated MBS re: backflow from cervical esophagus with solids therefore need for strategies. Esophagram performed 1/28 however "markedly limited evaluation due to patient's inability to follow directions. Only occasional swallows of contrast passed into the esophagus. No large esophageal mass, narrowing or stricture". He has low grade fever. Suspect dysphagia may be chronic and exacerbated by this illness with intubation. Continue Dys 3, thin liquids with precautions although unable to prevent aspiration but hopes to lessen.      HPI HPI: 84 y.o. male with known seizure disorder admitted through ED in respiratory distress following seizure requiring intubation. ETT 1/19-21.       SLP Plan  Continue with current plan of care       Recommendations  Diet recommendations: Dysphagia 3 (mechanical soft);Thin liquid Liquids provided via: Cup Medication Administration: Crushed with puree Supervision: Patient able to self feed;Full supervision/cueing for compensatory strategies Compensations: Slow rate;Small sips/bites;Multiple dry swallows after each bite/sip;Follow solids with liquid;Clear throat intermittently;Chin tuck(chin tuck during second dry swallow after solids) Postural Changes and/or Swallow Maneuvers: Chin tuck(chin tuck during second dry swallow after solids)                Oral Care Recommendations: Oral care BID Follow up Recommendations: Skilled Nursing facility SLP Visit Diagnosis: Dysphagia, pharyngeal phase (R13.13) Plan: Continue with current plan of  care       GO                Royce MacadamiaLitaker, Lenya Sterne Willis 02/06/2017, 10:45 AM  Breck CoonsLisa Willis Lonell FaceLitaker M.Ed [employer]P Pager [phone]

## 2017-02-06 NOTE — Plan of Care (Signed)
Continue current care plan 

## 2017-02-06 NOTE — Progress Notes (Addendum)
   02/06/17 0203  Urine Characteristics  Bladder Scan Volume (mL) 999 mL    Pt stated he felt like he has to pee but cannot get it out. MD paged orders received for foley. Will continue to monitor.

## 2017-02-06 NOTE — Progress Notes (Signed)
PROGRESS NOTE [facility]   Zachary Peterson   ZOX:[id number] DOB: [birth year]  DOA: 01/26/2017 PCP: Ignacia Palma., MD   Brief Narrative:  Zachary Peterson is an 84 year old male with known seizure disorder, COPD, hypertension, A. fib, and history of GI bleed presented to the emergency department with acute respiratory distress after sustaining a seizure.  After EMS was called patient had another witnessed tonic-clonic seizure, he was intubated for airway protection and he was admitted to the ICU.  Neurology was consulted and patient was loaded with Keppra, and continued on Dilantin. Patient was successfully extubated on 1/21, fail swallow eval when for modified barium and recommended mechanical soft diet.    Subjective: Pt has no new complaints reported today. Was sitting up eating breakfast. No new issues reported by nursing.  Assessment & Plan: Acute metabolic encephalopathy  Multifactorial - seizures, Pneumonia, hypoxia, hospital delirium  Treat underlying causes AVOID ATIVAN as this make delirium worse  Suspecting sundowning - Continue Risperidone 0.5 mg BID  If agitated can use Haldol  Keep room bright  Outbed as tolerated  PT recommending CIR pending insurance approval   Acute respiratory failure with hypoxia due to aspiration PNA - stable  Wean off O2 as tolerated keep O2 sats between 89-92%  Continue Zosyn, patient was on Vancomycin due to ? Bacteremia but only 1/2 bottle grew coagulase negative, likely to be contaminant. d/c Vancomycin  The Patient completed 9 days course of Zosyn for aspiration, will continue to monitor for now  Repeated blood cultures no growth so far Felt that fluid overload also contributed, patient was treated with Lasix   Seizures - seizures free  Case discussed with neurology recommending start Vimpat, load 200 mg IV then 200 mg BID.  D/c neurology and ok to discontinue Dilantin, order placed. Patient was not able to tolerate Keppra due to  lethargy.  Patient on Valium as well  D/c ativan   Afib  HR well controlled, on BB  Continue heparin to bridge with warfarin per pharmacy  INR continues to be subtherapeutic - difficult to control given interaction with Dilantin  INR goal 2-3 Maybe in the long term he can be transitioned to NOAC as Dilantin will be d/ced, defer to cardiology as outpatient   COPD - stable Continue nebulizer No wheezing   HTN  BP remains slight elevated Bisoprolol was recently increased to 5 mg BID  Continue Hydralazine and Labetalol PRN  Continue to monitor  Resume BP meds as able   Dysphagia  Soft diet  Esophagram was limited but not obvious mass or strictures   Urinary retention - some urine this morning on condom cath  Likely from acute illness  Medications reviewed  Continue Flomax  If signs of retention again will place foley again consider urological consult   Hypokalemia  Still low will plan on replacing. Check in AM   DVT prophylaxis: Heparin ggt/Warfarin  Code Status: Full Code  Family Communication: None at bedside  Disposition Plan: CIR once cleared for this ` Consultants:   PCCM   Neurology   Procedures:     Antimicrobials: Anti-infectives (From admission, onward)   Start     Dose/Rate Route Frequency Ordered Stop   02/01/17 1400  vancomycin (VANCOCIN) IVPB 750 mg/150 ml premix  Status:  Discontinued     750 mg 150 mL/hr over 60 Minutes Intravenous Every 12 hours 02/01/17 1251 02/04/17 1314   01/30/17 0000  vancomycin (VANCOCIN) 500 mg in sodium chloride 0.9 % 100  mL IVPB  Status:  Discontinued     500 mg 100 mL/hr over 60 Minutes Intravenous Every 12 hours 01/29/17 1049 02/01/17 0912   01/29/17 1200  vancomycin (VANCOCIN) 1,250 mg in sodium chloride 0.9 % 250 mL IVPB     1,250 mg 166.7 mL/hr over 90 Minutes Intravenous  Once 01/29/17 1049 01/29/17 1328   01/27/17 1045  piperacillin-tazobactam (ZOSYN) IVPB 3.375 g  Status:  Discontinued     3.375 g 12.5  mL/hr over 240 Minutes Intravenous Every 8 hours 01/27/17 1033 02/04/17 1314     Objective: Vitals:   02/06/17 0035 02/06/17 0340 02/06/17 0400 02/06/17 0819  BP: (!) 165/51 (!) 164/68 (!) 157/64 (!) 151/60  Pulse:    82  Resp: 19 19 16 19   Temp:  99 F (37.2 C)  99.4 F (37.4 C)  TempSrc:  Oral  Oral  SpO2:  98% 97% 95%  Weight:  66 kg (145 lb 8.1 oz)    Height:        Intake/Output Summary (Last 24 hours) at 02/06/2017 0858 Last data filed at 02/06/2017 0400 Gross per 24 hour  Intake 833 ml  Output 1450 ml  Net -617 ml   Filed Weights   02/04/17 0432 02/05/17 0340 02/06/17 0340  Weight: 68.4 kg (150 lb 12.7 oz) 66.6 kg (146 lb 13.2 oz) 66 kg (145 lb 8.1 oz)    Examination:  General: Pt in nad, alert and awake Cardiovascular: RRR, S1/S2 + Respiratory: Decrease breath sounds b/l,no wheezes, no increased wob Abdominal: Soft, NT, ND, bowel sounds Extremities: no edema  Data Reviewed: I have personally reviewed following labs and imaging studies  CBC: Recent Labs  Lab 02/01/17 0251 02/02/17 0624 02/03/17 0502 02/04/17 0228 02/05/17 0322 02/06/17 0310  WBC 8.1 9.5 11.2* 15.9* 14.3* 15.6*  NEUTROABS 6.2  --   --   --   --   --   HGB 10.5* 11.1* 10.7* 11.3* 10.2* 10.6*  HCT 30.9* 31.7* 30.3* 33.1* 29.6* 29.7*  MCV 92.0 90.8 91.8 90.9 90.8 90.3  PLT 253 315 331 451* 551* 567*   Basic Metabolic Panel: Recent Labs  Lab 01/31/17 0726 01/31/17 1919 01/31/17 2259 02/01/17 0251 02/01/17 1552 02/02/17 0624 02/05/17 0754 02/05/17 1221 02/06/17 0310  NA 134*  --   --  135  --  133* 135  --  132*  K 3.7  --   --  3.5  --  3.4* 2.8*  --  3.2*  CL 99*  --   --  101  --  100* 100*  --  97*  CO2 22  --   --  23  --  22 23  --  23  GLUCOSE 155*  --   --  133*  --  112* 106*  --  115*  BUN 11  --   --  13  --  10 15  --  13  CREATININE 1.11  --   --  1.13  --  0.91 1.10  --  1.00  CALCIUM 8.1*  --   --  7.7*  --  7.9* 8.0*  --  8.0*  MG 1.8 1.7  --   --   --  1.7   --  1.9 1.8  PHOS 1.1* <1.0* <1.0*  --  1.5* 1.6*  --   --   --    GFR: Estimated Creatinine Clearance: 52.3 mL/min (by C-G formula based on SCr of 1 mg/dL). Liver Function Tests: Recent Labs  Lab 01/30/17 1035 01/31/17 0726  AST 21 19  ALT 11* 10*  ALKPHOS 77 74  BILITOT 1.5* 1.6*  PROT 6.5 6.3*  ALBUMIN 2.4* 2.3*   No results for input(s): LIPASE, AMYLASE in the last 168 hours. Recent Labs  Lab 02/01/17 0251  AMMONIA 34   Coagulation Profile: Recent Labs  Lab 02/02/17 0624 02/03/17 0502 02/04/17 0228 02/05/17 0322 02/06/17 0310  INR 1.07 1.15 1.28 1.49 1.69   Cardiac Enzymes: No results for input(s): CKTOTAL, CKMB, CKMBINDEX, TROPONINI in the last 168 hours. BNP (last 3 results) No results for input(s): PROBNP in the last 8760 hours. HbA1C: No results for input(s): HGBA1C in the last 72 hours. CBG: Recent Labs  Lab 02/05/17 1803 02/05/17 1944 02/05/17 2306 02/06/17 0337 02/06/17 0819  GLUCAP 121* 165* 120* 108* 99   Lipid Profile: No results for input(s): CHOL, HDL, LDLCALC, TRIG, CHOLHDL, LDLDIRECT in the last 72 hours. Thyroid Function Tests: No results for input(s): TSH, T4TOTAL, FREET4, T3FREE, THYROIDAB in the last 72 hours. Anemia Panel: No results for input(s): VITAMINB12, FOLATE, FERRITIN, TIBC, IRON, RETICCTPCT in the last 72 hours. Sepsis Labs: Recent Labs  Lab 01/30/17 1034 01/30/17 1035 01/30/17 1400 01/31/17 0726 02/01/17 0251  PROCALCITON  --  2.39  --  2.31 2.02  LATICACIDVEN 1.3  --  1.3  --   --     Recent Results (from the past 240 hour(s))  Culture, respiratory (NON-Expectorated)     Status: None   Collection Time: 01/28/17  7:08 AM  Result Value Ref Range Status   Specimen Description TRACHEAL ASPIRATE  Final   Special Requests Normal  Final   Gram Stain NO WBC SEEN RARE YEAST   Final   Culture FEW CANDIDA TROPICALIS  Final   Report Status 01/30/2017 FINAL  Final  Culture, blood (routine x 2)     Status: Abnormal    Collection Time: 01/28/17  7:35 AM  Result Value Ref Range Status   Specimen Description BLOOD LEFT HAND  Final   Special Requests IN PEDIATRIC BOTTLE Blood Culture adequate volume  Final   Culture  Setup Time   Final    GRAM POSITIVE COCCI IN CLUSTERS IN PEDIATRIC BOTTLE Organism ID to follow    Culture (A)  Final    STAPHYLOCOCCUS SPECIES (COAGULASE NEGATIVE) THE SIGNIFICANCE OF ISOLATING THIS ORGANISM FROM A SINGLE SET OF BLOOD CULTURES WHEN MULTIPLE SETS ARE DRAWN IS UNCERTAIN. PLEASE NOTIFY THE MICROBIOLOGY DEPARTMENT WITHIN ONE WEEK IF SPECIATION AND SENSITIVITIES ARE REQUIRED.    Report Status 01/31/2017 FINAL  Final  Blood Culture ID Panel (Reflexed)     Status: Abnormal   Collection Time: 01/28/17  7:35 AM  Result Value Ref Range Status   Enterococcus species NOT DETECTED NOT DETECTED Final   Listeria monocytogenes NOT DETECTED NOT DETECTED Final   Staphylococcus species DETECTED (A) NOT DETECTED Final    Comment: Methicillin (oxacillin) susceptible coagulase negative staphylococcus. Possible blood culture contaminant (unless isolated from more than one blood culture draw or clinical case suggests pathogenicity). No antibiotic treatment is indicated for blood  culture contaminants. CRITICAL RESULT CALLED TO, READ BACK BY AND VERIFIED WITH: N. Batchelder Pharm.D. 10:50 01/29/17 ([email or web address])    Staphylococcus aureus NOT DETECTED NOT DETECTED Final   Methicillin resistance NOT DETECTED NOT DETECTED Final   Streptococcus species NOT DETECTED NOT DETECTED Final   Streptococcus agalactiae NOT DETECTED NOT DETECTED Final   Streptococcus pneumoniae NOT DETECTED NOT DETECTED Final   Streptococcus pyogenes NOT DETECTED NOT  DETECTED Final   Acinetobacter baumannii NOT DETECTED NOT DETECTED Final   Enterobacteriaceae species NOT DETECTED NOT DETECTED Final   Enterobacter cloacae complex NOT DETECTED NOT DETECTED Final   Escherichia coli NOT DETECTED NOT DETECTED Final   Klebsiella oxytoca  NOT DETECTED NOT DETECTED Final   Klebsiella pneumoniae NOT DETECTED NOT DETECTED Final   Proteus species NOT DETECTED NOT DETECTED Final   Serratia marcescens NOT DETECTED NOT DETECTED Final   Carbapenem resistance NOT DETECTED NOT DETECTED Final   Haemophilus influenzae NOT DETECTED NOT DETECTED Final   Neisseria meningitidis NOT DETECTED NOT DETECTED Final   Pseudomonas aeruginosa NOT DETECTED NOT DETECTED Final   Candida albicans NOT DETECTED NOT DETECTED Final   Candida glabrata NOT DETECTED NOT DETECTED Final   Candida krusei NOT DETECTED NOT DETECTED Final   Candida parapsilosis NOT DETECTED NOT DETECTED Final   Candida tropicalis NOT DETECTED NOT DETECTED Final  Culture, blood (routine x 2)     Status: None   Collection Time: 01/28/17  7:45 AM  Result Value Ref Range Status   Specimen Description BLOOD LEFT ANTECUBITAL  Final   Special Requests IN PEDIATRIC BOTTLE Blood Culture adequate volume  Final   Culture NO GROWTH 5 DAYS  Final   Report Status 02/02/2017 FINAL  Final  Culture, blood (Routine X 2) w Reflex to ID Panel     Status: None (Preliminary result)   Collection Time: 02/01/17 11:05 AM  Result Value Ref Range Status   Specimen Description BLOOD LEFT HAND  Final   Special Requests IN PEDIATRIC BOTTLE Blood Culture adequate volume  Final   Culture NO GROWTH 4 DAYS  Final   Report Status PENDING  Incomplete  Culture, blood (Routine X 2) w Reflex to ID Panel     Status: None (Preliminary result)   Collection Time: 02/01/17 11:12 AM  Result Value Ref Range Status   Specimen Description BLOOD RIGHT HAND  Final   Special Requests IN PEDIATRIC BOTTLE Blood Culture adequate volume  Final   Culture NO GROWTH 4 DAYS  Final   Report Status PENDING  Incomplete      Radiology Studies: Dg Esophagus  Result Date: 02/04/2017 CLINICAL DATA:  Possible esophageal stricture EXAM: ESOPHOGRAM/BARIUM SWALLOW TECHNIQUE: Single contrast examination was performed using  thin barium.  FLUOROSCOPY TIME:  Fluoroscopy Time:  2 minutes 24 seconds Radiation Exposure Index (if provided by the fluoroscopic device): 13.2 mGy Number of Acquired Spot Images: 5 COMPARISON:  None. FINDINGS: Markedly limited evaluation due to patient's inability to follow directions. Only occasional swallows of contrast passed into the esophagus. No large esophageal mass. No definite esophageal narrowing or stricture. IMPRESSION: Markedly limited evaluation due to the patient's inability to follow directions. No large esophageal mass or definite esophageal narrowing or stricture. These results were called by telephone at the time of interpretation on 02/04/2017 at 12:27 pm to Dr. Lenox PondsEDWIN SILVA ZAPATA, who verbally acknowledged these results. Electronically Signed   By: Charline BillsSriyesh  Krishnan M.D.   On: 02/04/2017 12:37      Scheduled Meds: . arformoterol  15 mcg Nebulization BID  . bisoprolol  5 mg Oral BID  . budesonide (PULMICORT) nebulizer solution  0.5 mg Nebulization BID  . diazepam  2 mg Oral Q12H  . feeding supplement (ENSURE ENLIVE)  237 mL Oral TID BM  . hydrochlorothiazide  25 mg Oral Daily  . lacosamide  200 mg Oral BID  . mouth rinse  15 mL Mouth Rinse BID  . mirtazapine  15 mg Oral QHS  . pantoprazole (PROTONIX) IV  40 mg Intravenous Daily  . risperiDONE  0.5 mg Oral BID  . tamsulosin  0.4 mg Oral QPC breakfast  . Warfarin - Pharmacist Dosing Inpatient   Does not apply q1800   Continuous Infusions: . sodium chloride Stopped (01/29/17 0800)  . heparin 1,750 Units/hr (02/06/17 0425)     LOS: 10 days   Time spent: Total of 30 minutes spent with pt, greater than 50% of which was spent in discussion of  treatment, counseling and coordination of care   Penny Pia, MD Pager: Text Page via [email or web address]   If 7PM-7AM, please contact night-coverage [email or web address] 02/06/2017, 8:58 AM

## 2017-02-06 NOTE — Progress Notes (Signed)
ANTICOAGULATION CONSULT NOTE   Pharmacy Consult for Heparin Indication: atrial fibrillation  Allergies  Allergen Reactions  . Aspirin Other (See Comments)    CAUSES ULCERS  . Codeine Nausea And Vomiting  . Oxycodone Nausea And Vomiting   Patient Measurements: Height: 5\' 7"  (170.2 cm) Weight: 145 lb 8.1 oz (66 kg) IBW/kg (Calculated) : 66.1  Vital Signs: Temp: 99 F (37.2 C) (01/30 0340) Temp Source: Oral (01/30 0340) BP: 164/68 (01/30 0340)  Labs: Recent Labs    02/04/17 0228 02/05/17 0322 02/05/17 0754 02/05/17 1221 02/06/17 0310  HGB 11.3* 10.2*  --   --  10.6*  HCT 33.1* 29.6*  --   --  29.7*  PLT 451* 551*  --   --  567*  LABPROT 15.9* 17.9*  --   --  19.7*  INR 1.28 1.49  --   --  1.69  HEPARINUNFRC 0.37 0.28*  --  0.63 0.20*  CREATININE  --   --  1.10  --  1.00   Estimated Creatinine Clearance: 52.3 mL/min (by C-G formula based on SCr of 1 mg/dL).  Medical History: Past Medical History:  Diagnosis Date  . Back pain   . COPD (chronic obstructive pulmonary disease) (HCC)   . Depression   . Fatigue    a. in setting of Afib, improved following DCCV.  [name]. Hearing loss   . History of GI bleed 1976  . Hyperlipemia   . Hypertension   . Migraine   . Palpitations   . Persistent atrial fibrillation (HCC)    a. 02/2016 Echo: EF 66-65%, mild MR;  b. CHA2DS2VASc = 3-->coumadin;  c. 05/2016 TEE/DCCV: EF 45-50%, triv AI, mild to mod MR, no LA/LAA/RA/RAA thrombus, mod to sev TR-->successful DCCV.  . Seizure (HCC)   . Syncope    a. related to seizure disorder   Assessment: 84 y/o M on warfarin PTA for afib, presents to the ED with seizures, [facility] for same issue, required intubation 1/20-1/21.  Pharmacy was consulted at admission to hold warfarin and heparin was started after INR < 2.  Coumadin resumed 1/25, heparin continues until INR back over 2.  Heparin level sub-therapeutic this AM, no issues per RN  Goal of Therapy:  Heparin level 0.3-0.7  units/ml  Monitor platelets by anticoagulation protocol: Yes   Plan:  -Inc heparin slightly back to 1750 units/hr-seems to be sensitive to small changes in heparin dose -1300 HL  Abran DukeJames Karynn Deblasi, PharmD, BCPS Clinical Pharmacist Phone: [phone]

## 2017-02-06 NOTE — Progress Notes (Signed)
Rehab admissions - I have [facility] clinicals [facility] and am awaiting their response regarding acute inpatient rehab admission.  I will update all once I hear back from insurance case manager.  Call me for questions.  [phone]

## 2017-02-06 NOTE — Progress Notes (Signed)
ANTICOAGULATION CONSULT NOTE   Pharmacy Consult for Heparin Indication: atrial fibrillation  Allergies  Allergen Reactions  . Aspirin Other (See Comments)    CAUSES ULCERS  . Codeine Nausea And Vomiting  . Oxycodone Nausea And Vomiting   Patient Measurements: Height: 5\' 7"  (170.2 cm) Weight: 145 lb 8.1 oz (66 kg) IBW/kg (Calculated) : 66.1  Vital Signs: Temp: 99.4 F (37.4 C) (01/30 0819) Temp Source: Oral (01/30 0819) BP: 151/60 (01/30 0819) Pulse Rate: 82 (01/30 0819)  Labs: Recent Labs    02/04/17 0228 02/05/17 0322 02/05/17 0754 02/05/17 1221 02/06/17 0310  HGB 11.3* 10.2*  --   --  10.6*  HCT 33.1* 29.6*  --   --  29.7*  PLT 451* 551*  --   --  567*  LABPROT 15.9* 17.9*  --   --  19.7*  INR 1.28 1.49  --   --  1.69  HEPARINUNFRC 0.37 0.28*  --  0.63 0.20*  CREATININE  --   --  1.10  --  1.00   Estimated Creatinine Clearance: 52.3 mL/min (by C-G formula based on SCr of 1 mg/dL).  Medical History: Past Medical History:  Diagnosis Date  . Back pain   . COPD (chronic obstructive pulmonary disease) (HCC)   . Depression   . Fatigue    a. in setting of Afib, improved following DCCV.  [name]. Hearing loss   . History of GI bleed 1976  . Hyperlipemia   . Hypertension   . Migraine   . Palpitations   . Persistent atrial fibrillation (HCC)    a. 02/2016 Echo: EF 66-65%, mild MR;  b. CHA2DS2VASc = 3-->coumadin;  c. 05/2016 TEE/DCCV: EF 45-50%, triv AI, mild to mod MR, no LA/LAA/RA/RAA thrombus, mod to sev TR-->successful DCCV.  . Seizure (HCC)   . Syncope    a. related to seizure disorder   Assessment: 84 y/o M on warfarin PTA for afib, presents to the ED with seizures, [facility] for same issue, required intubation 1/20-1/21.  Pharmacy was consulted at admission to hold warfarin and heparin was started after INR < 2.  Coumadin resumed 1/25, heparin continues until INR back over 2.  Heparin level sub-therapeutic this AM, now at goal this afternoon  after small rate adjustments.  INR up to 1.69, slow trend up will give extra warfarin tonight.  Goal of Therapy:  INR goal 2-3 Heparin level 0.3-0.7 units/ml  Monitor platelets by anticoagulation protocol: Yes   Plan:  -Keep heparin at 1750 units/hr-seems to be sensitive to small changes in heparin dose Warfarin 12.5mg  tonight  Sheppard CoilFrank Taniela Feltus PharmD., BCPS Clinical Pharmacist 02/06/2017 10:12 AM

## 2017-02-06 NOTE — Progress Notes (Signed)
D/c dilantin with neurologist since plan passed on was to taper off dilantin. Per their recommendations dilantin was ok to be discontinued as patient is on vimpat.  [employer] Communicationsrlando Izola Teague

## 2017-02-07 LAB — CBC
HCT: 31.9 % — ABNORMAL LOW (ref 39.0–52.0)
HEMOGLOBIN: 11 g/dL — AB (ref 13.0–17.0)
MCH: 31.5 pg (ref 26.0–34.0)
MCHC: 34.5 g/dL (ref 30.0–36.0)
MCV: 91.4 fL (ref 78.0–100.0)
PLATELETS: 648 10*3/uL — AB (ref 150–400)
RBC: 3.49 MIL/uL — ABNORMAL LOW (ref 4.22–5.81)
RDW: 14.8 % (ref 11.5–15.5)
WBC: 13.3 10*3/uL — ABNORMAL HIGH (ref 4.0–10.5)

## 2017-02-07 LAB — GLUCOSE, CAPILLARY
GLUCOSE-CAPILLARY: 157 mg/dL — AB (ref 65–99)
GLUCOSE-CAPILLARY: 107 mg/dL — AB (ref 65–99)
GLUCOSE-CAPILLARY: 128 mg/dL — AB (ref 65–99)
GLUCOSE-CAPILLARY: 131 mg/dL — AB (ref 65–99)
GLUCOSE-CAPILLARY: 143 mg/dL — AB (ref 65–99)
Glucose-Capillary: 116 mg/dL — ABNORMAL HIGH (ref 65–99)

## 2017-02-07 LAB — BASIC METABOLIC PANEL
Anion gap: 12 (ref 5–15)
BUN: 13 mg/dL (ref 6–20)
CALCIUM: 8 mg/dL — AB (ref 8.9–10.3)
CHLORIDE: 98 mmol/L — AB (ref 101–111)
CO2: 23 mmol/L (ref 22–32)
CREATININE: 1.06 mg/dL (ref 0.61–1.24)
GFR calc non Af Amer: >60 mL/min (ref >60)
Glucose, Bld: 97 mg/dL (ref 65–99)
Potassium: 3.5 mmol/L (ref 3.5–5.1)
SODIUM: 133 mmol/L — AB (ref 135–145)

## 2017-02-07 MED ORDER — SENNA 8.6 MG PO TABS
1.0000 | ORAL_TABLET | Freq: Every day | ORAL | Status: DC
  Administered 2017-02-07 – 2017-02-08 (×2): 8.6 mg via ORAL
  Filled 2017-02-07 (×2): qty 1

## 2017-02-07 MED ORDER — WARFARIN SODIUM 5 MG PO TABS
5.0000 mg | ORAL_TABLET | Freq: Once | ORAL | Status: AC
  Administered 2017-02-07: 5 mg via ORAL
  Filled 2017-02-07: qty 1

## 2017-02-07 NOTE — Clinical Social Work Note (Signed)
CSW acknowledges SNF consult. Per CIR admissions coordinator, family wants home health instead of SNF. RNCM and MD notified.  CSW signing off. Consult again if any other social work needs arise.  Charlynn CourtSarah Kenniya Westrich, CSW [phone]

## 2017-02-07 NOTE — Progress Notes (Addendum)
Occupational Therapy Treatment Patient Details Name: Zachary RavelSamuel D Peterson MRN: [id number] DOB: 1931/12/02 Today's Date: 02/07/2017    History of present illness 84 y.o. male admitted on 01/26/17 for acute respiratory distress s/p seizure.  CT of head was negative.  Pt was intubated 01/26/17-01/28/17.  Other significant dx: PNA, dysphasia, hypophosphatemia, bacteremia, encepholopathy, HTN and fever. Pt with significant PMH of syncope, seizure, a-fib s/p cardioversion, migraine, HTN, GIB, HOH, COPD, back pain s/p surgery, fatigue and depression.     OT comments  Pt with very limited activity tolerance with OT this pm.  He requires increased time to complete simple ADL tasks, and only able to stand x ~10-15 seconds due to dizziness.  BP seated 136/44.  Unable to obtain pressure while standing due to dizziness, but BP upon return to sitting 98/53.   02 sats 87% on RA with activity.  Pt  Very upset this pm that he can't have his tobacco in the hospital - [facility].    Recommend w/c for home use as activity tolerance decreases throughout the day as he fatigues.   Follow Up Recommendations  Supervision/Assistance - 24 hour;CIR(If activity tolerance improves )    Equipment Recommendations  Wheelchair (measurements OT)    Recommendations for Other Services      Precautions / Restrictions Precautions Precautions: Fall;Other (comment) Precaution Comments: monitor vitals/BP, seizure precautions       Mobility Bed Mobility               General bed mobility comments: up in chair   Transfers Overall transfer level: Needs assistance Equipment used: Rolling walker (2 wheeled) Transfers: Sit to/from Stand Sit to Stand: Min assist         General transfer comment: Pt with very limited standing tolerance due to immediate feeling of dizziness upon standing.  BP seated 136/44.  Pt only able to stand ~10seconds before needing to sit so unable to get accurate BP reading in  standing.  BP once he returned to sitting was 98/53 - RN notified     Balance Overall balance assessment: Needs assistance Sitting-balance support: Feet supported;Bilateral upper extremity supported Sitting balance-Leahy Scale: Fair Sitting balance - Comments: able to lean forward to access feet, with unilateral UE support    Standing balance support: Bilateral upper extremity supported Standing balance-Leahy Scale: Poor Standing balance comment: requires UE support and min A                            ADL either performed or assessed with clinical judgement   ADL Overall ADL's : Needs assistance/impaired Eating/Feeding: Moderate assistance Eating/Feeding Details (indicate cue type and reason): Pt with tremors and reports frequently dropping items.  Wife reports they have weighted utensils, but they have not worked very well                  Lower Body Dressing: Maximal assistance;Sit to/from stand Lower Body Dressing Details (indicate cue type and reason): Pt able to reach down to ankles to pull up socks.  He becomes dizzy upon standing and unable to complete LB dressing tasks  Toilet Transfer: Moderate assistance;Total assistance;Stand-pivot;BSC           Functional mobility during ADLs: Moderate assistance;+2 for physical assistance General ADL Comments: very limited activity tolerance.  Pt moves very slowly      Vision       Perception     Praxis  Cognition Arousal/Alertness: Awake/alert Behavior During Therapy: WFL for tasks assessed/performed Overall Cognitive Status: Impaired/Different from baseline                     Current Attention Level: Selective Memory: Decreased short-term memory Following Commands: Follows one step commands consistently Safety/Judgement: Decreased awareness of safety   Problem Solving: Slow processing General Comments: Pt frequently having to ask wife to answer questions for him.  Does not recall  walking with PT today, nor recently         Exercises     Shoulder Instructions       General Comments wife present.  Discussed recommendation for w/c for home use     Pertinent Vitals/ Pain       Pain Assessment: No/denies pain  Home Living                                          Prior Functioning/Environment              Frequency  Min 2X/week        Progress Toward Goals  OT Goals(current goals can now be found in the care plan section)  Progress towards OT goals: Progressing toward goals     Plan Discharge plan needs to be updated    Co-evaluation                 AM-PAC PT "6 Clicks" Daily Activity     Outcome Measure   Help from another person eating meals?: A Little Help from another person taking care of personal grooming?: A Little Help from another person toileting, which includes using toliet, bedpan, or urinal?: A Lot Help from another person bathing (including washing, rinsing, drying)?: A Lot Help from another person to put on and taking off regular upper body clothing?: A Lot Help from another person to put on and taking off regular lower body clothing?: A Lot 6 Click Score: 14    End of Session    OT Visit Diagnosis: Unsteadiness on feet (R26.81);Muscle weakness (generalized) (M62.81)   Activity Tolerance Treatment limited secondary to medical complications (Comment)   Patient Left in chair;with call bell/phone within reach;with family/visitor present   Nurse Communication Mobility status;Other (comment)(BP)        Time: 1610-9604 OT Time Calculation (min): 32 min  Charges: OT General Charges $OT Visit: 1 Visit OT Treatments $Therapeutic Activity: 23-37 mins  [employer], OTR/L [phone]    Jeani Hawking M 02/07/2017, 2:45 PM

## 2017-02-07 NOTE — Progress Notes (Signed)
PROGRESS NOTE [facility]   Zachary RavelSamuel D Slayden   WUJ:[id number] DOB: 11-28-1931  DOA: 01/26/2017 PCP: Ignacia PalmaBeck, Mark C., MD   Brief Narrative:  Zachary Peterson is an [age] year old male with known seizure disorder, COPD, hypertension, A. fib, and history of GI bleed presented to the emergency department with acute respiratory distress after sustaining a seizure.  After EMS was called patient had another witnessed tonic-clonic seizure, he was intubated for airway protection and he was admitted to the ICU.  Neurology was consulted and patient was loaded with Keppra, and continued on Dilantin. Patient was successfully extubated on 1/21, fail swallow eval when for modified barium and recommended mechanical soft diet.    Subjective: Patient per family has been more somnolent. Family states that the risperidal is a new medication as well as the vimpat.  Assessment & Plan: Acute metabolic encephalopathy  Multifactorial - seizures, Pneumonia, hypoxia, hospital delirium  Treat underlying causes AVOID ATIVAN as this make delirium worse  Suspecting sundowning - Will discontinue the risperidal due to hypersomnolence. If agitated can use Haldol  Keep room bright  Outbed as tolerated  PT recommending CIR pending insurance approval   Acute respiratory failure with hypoxia due to aspiration PNA - stable  Wean off O2 as tolerated keep O2 sats between 89-92%  Continue Zosyn, patient was on Vancomycin due to ? Bacteremia but only 1/2 bottle grew coagulase negative, likely to be contaminant. d/c Vancomycin   The Patient completed 9 days course of Zosyn for aspiration, will continue to monitor for now  Repeated blood cultures no growth so far Felt that fluid overload also contributed, patient was treated with Lasix   Seizures - seizures free  Case discussed with neurology recommending start Vimpat, load 200 mg IV then 200 mg BID.  D/c neurology and ok to discontinue Dilantin, order placed.  Patient was  not able to tolerate Keppra due to lethargy.  Patient on Valium as well  D/c ativan   Afib  HR well controlled, on BB  Continue heparin to bridge with warfarin per pharmacy  INR continues to be subtherapeutic - difficult to control given interaction with Dilantin  INR goal 2-3 Maybe in the long term he can be transitioned to NOAC as Dilantin will be d/ced, defer to cardiology as outpatient   COPD - stable Continue nebulizer No wheezing   HTN  BP remains slight elevated Bisoprolol was recently increased to 5 mg BID  Continue Hydralazine and Labetalol PRN  Continue to monitor  Resume BP meds as able   Dysphagia  Soft diet  Esophagram was limited but not obvious mass or strictures   Urinary retention - some urine this morning on condom cath  Likely from acute illness  Medications reviewed  Continue Flomax  If signs of retention again will place foley again consider urological consult   Hypokalemia  Still low will plan on replacing. Check in AM   DVT prophylaxis: Heparin ggt/Warfarin  Code Status: Full Code  Family Communication: None at bedside  Disposition Plan: Pt somnolent. Will d/c risperidal and plan on transitioning with improvement in mentation most likely next am to SNF or home. ` Consultants:   PCCM   Neurology   Procedures:     Antimicrobials: Anti-infectives (From admission, onward)   Start     Dose/Rate Route Frequency Ordered Stop   02/01/17 1400  vancomycin (VANCOCIN) IVPB 750 mg/150 ml premix  Status:  Discontinued     750 mg 150 mL/hr over 60 Minutes Intravenous  Every 12 hours 02/01/17 1251 02/04/17 1314   01/30/17 0000  vancomycin (VANCOCIN) 500 mg in sodium chloride 0.9 % 100 mL IVPB  Status:  Discontinued     500 mg 100 mL/hr over 60 Minutes Intravenous Every 12 hours 01/29/17 1049 02/01/17 0912   01/29/17 1200  vancomycin (VANCOCIN) 1,250 mg in sodium chloride 0.9 % 250 mL IVPB     1,250 mg 166.7 mL/hr over 90 Minutes Intravenous  Once  01/29/17 1049 01/29/17 1328   01/27/17 1045  piperacillin-tazobactam (ZOSYN) IVPB 3.375 g  Status:  Discontinued     3.375 g 12.5 mL/hr over 240 Minutes Intravenous Every 8 hours 01/27/17 1033 02/04/17 1314     Objective: Vitals:   02/07/17 0654 02/07/17 0718 02/07/17 0719 02/07/17 0722  BP:  (!) 144/57    Pulse:  80    Resp:  20    Temp:  (!) 97.3 F (36.3 C)    TempSrc:  Oral    SpO2:  93% 93% 93%  Weight: 66 kg (145 lb 8.1 oz)     Height:        Intake/Output Summary (Last 24 hours) at 02/07/2017 1049 Last data filed at 02/07/2017 0745 Gross per 24 hour  Intake 552.75 ml  Output 2050 ml  Net -1497.25 ml   Filed Weights   02/05/17 0340 02/06/17 0340 02/07/17 0654  Weight: 66.6 kg (146 lb 13.2 oz) 66 kg (145 lb 8.1 oz) 66 kg (145 lb 8.1 oz)    Examination: exam unchanged when compared to 02/06/17  General: Pt in nad, alert and awake Cardiovascular: RRR, S1/S2 + Respiratory: Decrease breath sounds b/l,no wheezes, no increased wob Abdominal: Soft, NT, ND, bowel sounds Extremities: no edema  Data Reviewed: I have personally reviewed following labs and imaging studies  CBC: Recent Labs  Lab 02/01/17 0251  02/03/17 0502 02/04/17 0228 02/05/17 0322 02/06/17 0310 02/07/17 0043  WBC 8.1   < > 11.2* 15.9* 14.3* 15.6* 13.3*  NEUTROABS 6.2  --   --   --   --   --   --   HGB 10.5*   < > 10.7* 11.3* 10.2* 10.6* 11.0*  HCT 30.9*   < > 30.3* 33.1* 29.6* 29.7* 31.9*  MCV 92.0   < > 91.8 90.9 90.8 90.3 91.4  PLT 253   < > 331 451* 551* 567* 648*   < > = values in this interval not displayed.   Basic Metabolic Panel: Recent Labs  Lab 01/31/17 1919 01/31/17 2259 02/01/17 0251 02/01/17 1552 02/02/17 1610 02/05/17 0754 02/05/17 1221 02/06/17 0310 02/07/17 0043  NA  --   --  135  --  133* 135  --  132* 133*  K  --   --  3.5  --  3.4* 2.8*  --  3.2* 3.5  CL  --   --  101  --  100* 100*  --  97* 98*  CO2  --   --  23  --  22 23  --  23 23  GLUCOSE  --   --  133*  --   112* 106*  --  115* 97  BUN  --   --  13  --  10 15  --  13 13  CREATININE  --   --  1.13  --  0.91 1.10  --  1.00 1.06  CALCIUM  --   --  7.7*  --  7.9* 8.0*  --  8.0* 8.0*  MG  1.7  --   --   --  1.7  --  1.9 1.8  --   PHOS <1.0* <1.0*  --  1.5* 1.6*  --   --   --   --    GFR: Estimated Creatinine Clearance: 49.3 mL/min (by C-G formula based on SCr of 1.06 mg/dL). Liver Function Tests: No results for input(s): AST, ALT, ALKPHOS, BILITOT, PROT, ALBUMIN in the last 168 hours. No results for input(s): LIPASE, AMYLASE in the last 168 hours. Recent Labs  Lab 02/01/17 0251  AMMONIA 34   Coagulation Profile: Recent Labs  Lab 02/02/17 0624 02/03/17 0502 02/04/17 0228 02/05/17 0322 02/06/17 0310  INR 1.07 1.15 1.28 1.49 1.69   Cardiac Enzymes: No results for input(s): CKTOTAL, CKMB, CKMBINDEX, TROPONINI in the last 168 hours. BNP (last 3 results) No results for input(s): PROBNP in the last 8760 hours. HbA1C: No results for input(s): HGBA1C in the last 72 hours. CBG: Recent Labs  Lab 02/06/17 1629 02/06/17 1954 02/06/17 2314 02/07/17 0320 02/07/17 0725  GLUCAP 109* 171* 128* 157* 107*   Lipid Profile: No results for input(s): CHOL, HDL, LDLCALC, TRIG, CHOLHDL, LDLDIRECT in the last 72 hours. Thyroid Function Tests: No results for input(s): TSH, T4TOTAL, FREET4, T3FREE, THYROIDAB in the last 72 hours. Anemia Panel: No results for input(s): VITAMINB12, FOLATE, FERRITIN, TIBC, IRON, RETICCTPCT in the last 72 hours. Sepsis Labs: Recent Labs  Lab 02/01/17 0251  PROCALCITON 2.02    Recent Results (from the past 240 hour(s))  Culture, blood (Routine X 2) w Reflex to ID Panel     Status: None   Collection Time: 02/01/17 11:05 AM  Result Value Ref Range Status   Specimen Description BLOOD LEFT HAND  Final   Special Requests IN PEDIATRIC BOTTLE Blood Culture adequate volume  Final   Culture NO GROWTH 5 DAYS  Final   Report Status 02/06/2017 FINAL  Final  Culture, blood  (Routine X 2) w Reflex to ID Panel     Status: None   Collection Time: 02/01/17 11:12 AM  Result Value Ref Range Status   Specimen Description BLOOD RIGHT HAND  Final   Special Requests IN PEDIATRIC BOTTLE Blood Culture adequate volume  Final   Culture NO GROWTH 5 DAYS  Final   Report Status 02/06/2017 FINAL  Final      Radiology Studies: No results found.    Scheduled Meds: . arformoterol  15 mcg Nebulization BID  . bisoprolol  5 mg Oral BID  . budesonide (PULMICORT) nebulizer solution  0.5 mg Nebulization BID  . diazepam  2 mg Oral Q12H  . feeding supplement (ENSURE ENLIVE)  237 mL Oral TID BM  . hydrochlorothiazide  25 mg Oral Daily  . lacosamide  200 mg Oral BID  . mouth rinse  15 mL Mouth Rinse BID  . mirtazapine  15 mg Oral QHS  . pantoprazole (PROTONIX) IV  40 mg Intravenous Daily  . tamsulosin  0.4 mg Oral QPC breakfast  . Warfarin - Pharmacist Dosing Inpatient   Does not apply q1800   Continuous Infusions: . sodium chloride 250 mL (02/06/17 0930)  . heparin 1,750 Units/hr (02/07/17 0024)     LOS: 11 days   Time spent: Total of 30 minutes spent with pt, greater than 50% of which was spent in discussion of  treatment, counseling and coordination of care   Penny Pia, MD Pager: Text Page via [email or web address]   If 7PM-7AM, please contact night-coverage [email or web address] 02/07/2017, 10:49 AM

## 2017-02-07 NOTE — Progress Notes (Signed)
Pt refusing lab draws, pt is confused and stated his hands were very sore and he couldn't stand it. Will pass information to day team.

## 2017-02-07 NOTE — Progress Notes (Signed)
Rehab admissions - I spoke with Dr. Cena BentonVega and he has agreed to do the peer to peer tomorrow.  I have called and set up that review for Dr. Cena BentonVega with the medical director.  Call me for questions.  #[phone]

## 2017-02-07 NOTE — Progress Notes (Signed)
ANTICOAGULATION CONSULT NOTE   Pharmacy Consult for Heparin/Warfarin Indication: atrial fibrillation  Allergies  Allergen Reactions  . Aspirin Other (See Comments)    CAUSES ULCERS  . Codeine Nausea And Vomiting  . Oxycodone Nausea And Vomiting   Patient Measurements: Height: 5\' 7"  (170.2 cm) Weight: 145 lb 8.1 oz (66 kg) IBW/kg (Calculated) : 66.1  Vital Signs: Temp: 97.3 F (36.3 C) (01/31 0718) Temp Source: Oral (01/31 0718) BP: 144/57 (01/31 0718) Pulse Rate: 80 (01/31 0718)  Labs: Recent Labs    02/05/17 0322 02/05/17 0754 02/05/17 1221 02/06/17 0310 02/06/17 1224 02/07/17 0043  HGB 10.2*  --   --  10.6*  --  11.0*  HCT 29.6*  --   --  29.7*  --  31.9*  PLT 551*  --   --  567*  --  648*  LABPROT 17.9*  --   --  19.7*  --   --   INR 1.49  --   --  1.69  --   --   HEPARINUNFRC 0.28*  --  0.63 0.20* 0.64  --   CREATININE  --  1.10  --  1.00  --  1.06   Estimated Creatinine Clearance: 49.3 mL/min (by C-G formula based on SCr of 1.06 mg/dL).  Medical History: Past Medical History:  Diagnosis Date  . Back pain   . COPD (chronic obstructive pulmonary disease) (HCC)   . Depression   . Fatigue    a. in setting of Afib, improved following DCCV.  [name]. Hearing loss   . History of GI bleed 1976  . Hyperlipemia   . Hypertension   . Migraine   . Palpitations   . Persistent atrial fibrillation (HCC)    a. 02/2016 Echo: EF 66-65%, mild MR;  b. CHA2DS2VASc = 3-->coumadin;  c. 05/2016 TEE/DCCV: EF 45-50%, triv AI, mild to mod MR, no LA/LAA/RA/RAA thrombus, mod to sev TR-->successful DCCV.  . Seizure (HCC)   . Syncope    a. related to seizure disorder   Assessment: 84 y/o M on warfarin PTA for afib, presents to the ED with seizures, [facility] for same issue, required intubation 1/20-1/21.  Pharmacy was consulted at admission to hold warfarin and heparin was started after INR < 2.  Coumadin resumed 1/25, heparin continues until INR back over 2.  Refused  am labs d/t confusion, no issues noted per nursing except hyper somnolence. With no heparin level will continue current dose for today, INR was trending up and extra warfarin given, will back down on dosing for tonight.  Goal of Therapy:  INR goal 2-3 Heparin level 0.3-0.7 units/ml  Monitor platelets by anticoagulation protocol: Yes   Plan:  -Keep heparin at 1750 units/hr-seems to be sensitive to small changes in heparin dose Give warfarin 5mg  tonight Hope to get labs in am  Sheppard CoilFrank Wilson PharmD., BCPS Clinical Pharmacist 02/07/2017 12:03 PM

## 2017-02-07 NOTE — Progress Notes (Addendum)
Physical Therapy Treatment Patient Details Name: Zachary RavelSamuel D Peterson MRN: [id number] DOB: 1931/06/23 Today's Date: 02/07/2017    History of Present Illness 84 y.o. male admitted on 01/26/17 for acute respiratory distress s/p seizure.  CT of head was negative.  Pt was intubated 01/26/17-01/28/17.  Other significant dx: PNA, dysphasia, hypophosphatemia, bacteremia, encepholopathy, HTN and fever. Pt with significant PMH of syncope, seizure, a-fib s/p cardioversion, migraine, HTN, GIB, HOH, COPD, back pain s/p surgery, fatigue and depression.      PT Comments    Pt performed short bout of gait but appears more fatigue than previous session.  Pt required cues for safety and +2 for chair follow.  Plan for post acute rehab remains appropriate to improve strength and function before return home.    Follow Up Recommendations  CIR     Equipment Recommendations  Rolling walker with 5" wheels;Hospital bed    Recommendations for Other Services Rehab consult     Precautions / Restrictions Precautions Precautions: Fall;Other (comment) Precaution Comments: monitor vitals/BP, seizure precautions Restrictions Weight Bearing Restrictions: No    Mobility  Bed Mobility               General bed mobility comments: up in chair   Transfers Overall transfer level: Needs assistance Equipment used: Rolling walker (2 wheeled) Transfers: Sit to/from Stand Sit to Stand: Min assist;Mod assist         General transfer comment: Cues for hand placement to and from seated surface.  During stand to sit pt leaning forward and required mod assist to facilitate hips back to seat of chair.    Ambulation/Gait Ambulation/Gait assistance: Mod assist Ambulation Distance (Feet): 20 Feet Assistive device: Rolling walker (2 wheeled) Gait Pattern/deviations: Step-to pattern;Shuffle;Trunk flexed;Narrow base of support   Gait velocity interpretation: Below normal speed for age/gender General Gait Details: Cues for  upper trunk control and forward gaze, poor position of RW requiring cues to step forward into RW. Close chair follow during gait.     Stairs            Wheelchair Mobility    Modified Rankin (Stroke Patients Only)       Balance Overall balance assessment: Needs assistance Sitting-balance support: Feet supported;Bilateral upper extremity supported Sitting balance-Leahy Scale: Fair Sitting balance - Comments: able to lean forward to access feet, with unilateral UE support    Standing balance support: Bilateral upper extremity supported Standing balance-Leahy Scale: Poor Standing balance comment: requires UE support and min A                             Cognition Arousal/Alertness: Awake/alert Behavior During Therapy: WFL for tasks assessed/performed Overall Cognitive Status: Impaired/Different from baseline Area of Impairment: Orientation;Attention;Memory;Following commands;Safety/judgement;Awareness;Problem solving                 Orientation Level: Disoriented to;Time Current Attention Level: Selective Memory: Decreased short-term memory Following Commands: Follows one step commands consistently Safety/Judgement: Decreased awareness of safety Awareness: Intellectual Problem Solving: Slow processing General Comments: Pt cannot recall how long he has been sitting in recliner chair.        Exercises      General Comments General comments (skin integrity, edema, etc.): wife present.  Discussed recommendation for w/c for home use       Pertinent Vitals/Pain Pain Assessment: No/denies pain(just reports feeling fatigued.  )    Home Living  Prior Function            PT Goals (current goals can now be found in the care plan section) Acute Rehab PT Goals Patient Stated Goal: to regain independence Potential to Achieve Goals: Good Progress towards PT goals: Progressing toward goals    Frequency    Min  3X/week      PT Plan Current plan remains appropriate    Co-evaluation              AM-PAC PT "6 Clicks" Daily Activity  Outcome Measure  Difficulty turning over in bed (including adjusting bedclothes, sheets and blankets)?: Unable Difficulty moving from lying on back to sitting on the side of the bed? : Unable Difficulty sitting down on and standing up from a chair with arms (e.g., wheelchair, bedside commode, etc,.)?: Unable Help needed moving to and from a bed to chair (including a wheelchair)?: A Lot Help needed walking in hospital room?: A Lot Help needed climbing 3-5 steps with a railing? : A Little 6 Click Score: 10    End of Session Equipment Utilized During Treatment: Gait belt;Oxygen Activity Tolerance: Patient limited by fatigue Patient left: with family/visitor present;in bed;with call bell/phone within reach;with nursing/sitter in room Nurse Communication: Mobility status PT Visit Diagnosis: Muscle weakness (generalized) (M62.81);Difficulty in walking, not elsewhere classified (R26.2)     Time: 1610-9604 PT Time Calculation (min) (ACUTE ONLY): 17 min  Charges:  $Gait Training: 8-22 mins                    G Codes:       Joycelyn Rua, PTA pager [phone]    Florestine Avers 02/07/2017, 2:53 PM

## 2017-02-07 NOTE — Progress Notes (Signed)
Paged dr Cena Bentonvega, pt states feels need to have BM but unable to, new orders rec'd for senokot

## 2017-02-07 NOTE — Care Management Note (Signed)
Case Management Note  Patient Details  Name: Zachary RavelSamuel D Peterson MRN: [id number] Date of Birth: [birth year]  Subjective/Objective:     Pt admitted post seizure               Action/Plan:  PTA independent from home with wife.  Daughter at bedside.  Therapy will be ordered once pt is considered stable.   Expected Discharge Date:  02/15/17               Expected Discharge Plan:  Home w Home Health Services  In-House Referral:     Discharge planning Services  CM Consult  Post Acute Care Choice:    Choice offered to:     DME Arranged:    DME Agency:     HH Arranged:    HH Agency:     Status of Service:     If discussed at [employer] of [employer] Meetings, dates discussed:    Additional Comments: 02/07/2017  CIR recommended - however insurance declined.  Both pt and wife declined SNF and request HH.  Wife informed CM that family would provide 24/7 assistance with pt including daughter that is a CNA and grand daughter that is a Engineer, civil (consulting)nurse.  Wife states pt will discharge home via private vehicle.  Wife declined hospital bed but she is in agreement for RW - [facility].  [facility] given and [facility] agency informed of pending referral.  CM requested HH/DME orders from attending  02/01/17 Per bedside nurse - pt is mostly total care at this point.  PT/OT ordered  Cherylann Parrlaxton, Josiyah Tozzi S, RN 02/07/2017, 12:09 PM

## 2017-02-07 NOTE — Plan of Care (Signed)
Continue current care plan, remains to require assistance with ambulation due to weakness, will continue to monitor.

## 2017-02-07 NOTE — Progress Notes (Signed)
Pt on bsc, feeling dizzy returned to bed, bp taken. 148/60. Bladder scan for only 326cc will continue to monitor.

## 2017-02-07 NOTE — Care Management (Addendum)
Pt's daughter Agustin CreeDarlene contacted [employer] to gain clarity on CIR insurance denial.  CM contacted by [facility] and informed that CIR authorization is pending - insurance company requesting attending to conduct Peer to Peer to assist with final determination. CM discussed request with attending - attending requested CM to provide peer to peer contact information in Epic.  Peer to Peer Review Information Please call [phone] ext [phone] authorization reference # [id number] To provide necessary information for pts insurance to potentially approve CIR. Raynald BlendSamantha Alois Mincer, RN, BSN [phone]

## 2017-02-07 NOTE — Progress Notes (Signed)
Was called earlier regarding difficulty with phenytoin doing as pt on warfarin. Recommended switching to Vimpat.

## 2017-02-08 LAB — PROTIME-INR
INR: 2.05
Prothrombin Time: 23 seconds — ABNORMAL HIGH (ref 11.4–15.2)

## 2017-02-08 LAB — CBC
HEMATOCRIT: 31.4 % — AB (ref 39.0–52.0)
HEMOGLOBIN: 10.6 g/dL — AB (ref 13.0–17.0)
MCH: 31 pg (ref 26.0–34.0)
MCHC: 33.8 g/dL (ref 30.0–36.0)
MCV: 91.8 fL (ref 78.0–100.0)
Platelets: 667 10*3/uL — ABNORMAL HIGH (ref 150–400)
RBC: 3.42 MIL/uL — AB (ref 4.22–5.81)
RDW: 15.1 % (ref 11.5–15.5)
WBC: 12.3 10*3/uL — ABNORMAL HIGH (ref 4.0–10.5)

## 2017-02-08 LAB — GLUCOSE, CAPILLARY
GLUCOSE-CAPILLARY: 117 mg/dL — AB (ref 65–99)
GLUCOSE-CAPILLARY: 126 mg/dL — AB (ref 65–99)
GLUCOSE-CAPILLARY: 130 mg/dL — AB (ref 65–99)
Glucose-Capillary: 124 mg/dL — ABNORMAL HIGH (ref 65–99)
Glucose-Capillary: 142 mg/dL — ABNORMAL HIGH (ref 65–99)
Glucose-Capillary: 159 mg/dL — ABNORMAL HIGH (ref 65–99)

## 2017-02-08 LAB — HEPARIN LEVEL (UNFRACTIONATED): HEPARIN UNFRACTIONATED: 0.64 [IU]/mL (ref 0.30–0.70)

## 2017-02-08 MED ORDER — PANTOPRAZOLE SODIUM 40 MG PO TBEC
40.0000 mg | DELAYED_RELEASE_TABLET | Freq: Every day | ORAL | Status: DC
  Administered 2017-02-08 – 2017-02-09 (×2): 40 mg via ORAL
  Filled 2017-02-08: qty 1

## 2017-02-08 MED ORDER — WARFARIN SODIUM 7.5 MG PO TABS
7.5000 mg | ORAL_TABLET | Freq: Once | ORAL | Status: AC
  Administered 2017-02-08: 7.5 mg via ORAL
  Filled 2017-02-08: qty 1

## 2017-02-08 NOTE — [place] FL2 (Signed)
[place] MEDICAID FL2 LEVEL OF CARE SCREENING TOOL     IDENTIFICATION  Patient Name: Zachary Peterson Birthdate: 01/05/1932 Sex: male Admission Date (Current Location): 01/26/2017  [facility] [place]   Facility and Address:  [facility] [place], [address], [place], [place] [address]      Provider Number: [id number]  Attending Physician Name and Address:  Penny PiaVega, [place], MD  Relative Name and Phone Number:       Current Level of Care: Hospital Recommended Level of Care: Skilled Nursing Facility Prior Approval Number:    Date Approved/Denied:   PASRR Number: [id number] [id number]  Discharge Plan: SNF    Current Diagnoses: Patient Active Problem List   Diagnosis Date Noted  . Acute respiratory failure with hypoxia (HCC)   . Dysphagia   . Chronic obstructive pulmonary disease (HCC)   . PAF (paroxysmal atrial fibrillation) (HCC)   . Benign essential HTN   . Hypophosphatemia   . Leukocytosis   . Acute blood loss anemia   . Aspiration pneumonia (HCC) 01/27/2017  . Endotracheally intubated 01/27/2017  . Acute hypercapnic respiratory failure (HCC)   . At high risk for aspiration   . (HFpEF) heart failure with preserved ejection fraction (HCC) 08/29/2016  . Persistent atrial fibrillation (HCC) 06/06/2016  . Encounter for therapeutic drug monitoring 03/14/2016  . Difficulty in walking, not elsewhere classified   . Muscle weakness (generalized)   . Diarrhea   . Seizure (HCC) 02/21/2016  . Atrial fibrillation with RVR (HCC) 02/21/2016  . Viral gastroenteritis 02/21/2016  . Hypokalemia 02/21/2016  . Demand ischemia of myocardium (HCC) 02/21/2016  . Essential hypertension 02/21/2016    Orientation RESPIRATION BLADDER Height & Weight     Self, Place  O2(Nasal Canula 2 L) Incontinent Weight: 141 lb (64 kg) Height:  5\' 7"  (170.2 cm)  BEHAVIORAL SYMPTOMS/MOOD NEUROLOGICAL BOWEL NUTRITION STATUS  (None) Convulsions/Seizures Continent Diet(DYS  3)  AMBULATORY STATUS COMMUNICATION OF NEEDS Skin   Limited Assist Verbally Normal                       Personal Care Assistance Level of Assistance  Bathing, Feeding, Dressing Bathing Assistance: Maximum assistance Feeding assistance: Limited assistance Dressing Assistance: Maximum assistance     Functional Limitations Info  Sight, Hearing, Speech Sight Info: Adequate Hearing Info: Adequate Speech Info: Adequate    SPECIAL CARE FACTORS FREQUENCY  PT (By licensed PT), OT (By licensed OT), Speech therapy     PT Frequency: 5 x week OT Frequency: 5 x week     Speech Therapy Frequency: 5 x week      Contractures Contractures Info: Not present    Additional Factors Info  Code Status, Allergies Code Status Info: Full Allergies Info: Aspirin, Codeine, Oxycodone           Current Medications (02/08/2017):  This is the current hospital active medication list Current Facility-Administered Medications  Medication Dose Route Frequency Provider Last Rate Last Dose  . 0.9 %  sodium chloride infusion  250 mL Intravenous PRN Tobey GrimEubanks, Katalina M, NP 10 mL/hr at 02/06/17 0930 250 mL at 02/06/17 0930  . acetaminophen (TYLENOL) solution 650 mg  650 mg Per Tube Q6H PRN Karl ItoSommer, Steven E, MD   650 mg at 01/28/17 0239  . acetaminophen (TYLENOL) suppository 650 mg  650 mg Rectal Q4H PRN Lynnell JudeGray, Walter J, MD   650 mg at 02/02/17 1224  . arformoterol (BROVANA) nebulizer solution 15 mcg  15  mcg Nebulization BID Tobey Grim, NP   15 mcg at 02/08/17 0756  . bisoprolol (ZEBETA) tablet 5 mg  5 mg Oral BID Randel Pigg, Dorma Russell, MD   5 mg at 02/08/17 [phone]  . budesonide (PULMICORT) nebulizer solution 0.5 mg  0.5 mg Nebulization BID Jovita Kussmaul M, NP   0.5 mg at 02/08/17 0756  . diazepam (VALIUM) tablet 2 mg  2 mg Oral Q12H Lupita Leash, MD   2 mg at 02/08/17 [phone]  . feeding supplement (ENSURE ENLIVE) (ENSURE ENLIVE) liquid 237 mL  237 mL Oral TID BM Randel Pigg, Dorma Russell, MD   237  mL at 02/07/17 2212  . haloperidol lactate (HALDOL) injection 2 mg  2 mg Intramuscular Q6H PRN Blount, Andi Devon T, NP      . hydrALAZINE (APRESOLINE) injection 10-40 mg  10-40 mg Intravenous Q4H PRN Oretha Milch, MD   40 mg at 02/05/17 2037  . hydrochlorothiazide (HYDRODIURIL) tablet 25 mg  25 mg Oral Daily Max Fickle B, MD   25 mg at 02/08/17 0839  . ipratropium-albuterol (DUONEB) 0.5-2.5 (3) MG/3ML nebulizer solution 3 mL  3 mL Nebulization Q6H PRN Scatliffe, Kristen D, MD      . labetalol (NORMODYNE,TRANDATE) injection 20 mg  20 mg Intravenous Q4H PRN Henry Russel, MD   20 mg at 02/04/17 1045  . lacosamide (VIMPAT) tablet 200 mg  200 mg Oral BID Randel Pigg, Dorma Russell, MD   200 mg at 02/08/17 5409  . MEDLINE mouth rinse  15 mL Mouth Rinse BID Scatliffe, Gypsy Balsam, MD   15 mL at 02/08/17 0850  . mirtazapine (REMERON) tablet 15 mg  15 mg Oral QHS Zigmund Gottron, MD   15 mg at 02/07/17 2212  . ondansetron (ZOFRAN) injection 4 mg  4 mg Intravenous Q8H PRN Lupita Leash, MD   4 mg at 02/03/17 2019  . pantoprazole (PROTONIX) EC tablet 40 mg  40 mg Oral Daily Earnie Larsson, RPH   40 mg at 02/08/17 0845  . RESOURCE THICKENUP CLEAR   Oral PRN Scatliffe, Gypsy Balsam, MD      . senna (SENOKOT) tablet 8.6 mg  1 tablet Oral QHS Penny Pia, MD   8.6 mg at 02/07/17 2212  . tamsulosin (FLOMAX) capsule 0.4 mg  0.4 mg Oral QPC breakfast Randel Pigg, Dorma Russell, MD   0.4 mg at 02/08/17 8119  . warfarin (COUMADIN) tablet 7.5 mg  7.5 mg Oral ONCE-1800 Earnie Larsson, [facility]      . Warfarin - Pharmacist Dosing Inpatient   Does not apply q1800 Silvana Newness, [facility]   1 each at 02/06/17 1722     Discharge Medications: Please see discharge summary for a list of discharge medications.  Relevant Imaging Results:  Relevant Lab Results:   Additional Information SS#: [id number]  Margarito Liner, LCSW

## 2017-02-08 NOTE — Progress Notes (Signed)
Occupational Therapy Treatment Patient Details Name: Zachary Peterson MRN: [id number] DOB: April 14, 1931 Today's Date: 02/08/2017    History of present illness 84 y.o. male admitted on 01/26/17 for acute respiratory distress s/p seizure.  CT of head was negative.  Pt was intubated 01/26/17-01/28/17.  Other significant dx: PNA, dysphasia, hypophosphatemia, bacteremia, encepholopathy, HTN and fever. Pt with significant PMH of syncope, seizure, a-fib s/p cardioversion, migraine, HTN, GIB, HOH, COPD, back pain s/p surgery, fatigue and depression.     OT comments  Pt provided with weighted lidded mug to reduce spillage and increase his ability to self feed.  He was able to use with supervision.  Agree with SNF level rehab at discharge.   Follow Up Recommendations  SNF;Supervision/Assistance - 24 hour    Equipment Recommendations  Wheelchair (measurements OT)    Recommendations for Other Services      Precautions / Restrictions Precautions Precautions: Fall;Other (comment) Precaution Comments: monitor vitals/BP, seizure precautions       Mobility Bed Mobility                  Transfers                      Balance                                           ADL either performed or assessed with clinical judgement   ADL Overall ADL's : Needs assistance/impaired Eating/Feeding: Moderate assistance;Sitting Eating/Feeding Details (indicate cue type and reason): Pt provided with weighted lidded mug.  He and wife were instructed in use.  He was able to drink from cup with supervision, and no spills.  He moves very slowly                                            Biochemist, clinical      Cognition Arousal/Alertness: Awake/alert Behavior During Therapy: WFL for tasks assessed/performed Overall Cognitive Status: Impaired/Different from baseline                     Current Attention Level: Selective Memory:  Decreased short-term memory Following Commands: Follows one step commands consistently Safety/Judgement: Decreased awareness of safety Awareness: Intellectual Problem Solving: Slow processing          Exercises     Shoulder Instructions       General Comments      Pertinent Vitals/ Pain       Pain Assessment: No/denies pain  Home Living                                          Prior Functioning/Environment              Frequency  Min 2X/week        Progress Toward Goals  OT Goals(current goals can now be found in the care plan section)  Progress towards OT goals: Progressing toward goals  ADL Goals Additional ADL Goal #1: Pt will drink from adapted mug mod I  Plan Discharge plan needs to be updated    Co-evaluation  AM-PAC PT "6 Clicks" Daily Activity     Outcome Measure   Help from another person eating meals?: A Little Help from another person taking care of personal grooming?: A Little Help from another person toileting, which includes using toliet, bedpan, or urinal?: A Lot Help from another person bathing (including washing, rinsing, drying)?: A Lot Help from another person to put on and taking off regular upper body clothing?: A Lot Help from another person to put on and taking off regular lower body clothing?: A Lot 6 Click Score: 14    End of Session    OT Visit Diagnosis: Unsteadiness on feet (R26.81);Muscle weakness (generalized) (M62.81)   Activity Tolerance Patient tolerated treatment well   Patient Left in chair;with call bell/phone within reach;with family/visitor present   Nurse Communication          Time:  -     Charges:    Jeani HawkingWendi Kina Shiffman, OTR/L [phone]    Jeani Hawkingonarpe, Chaquana Nichols M 02/08/2017, 1:45 PM

## 2017-02-08 NOTE — Progress Notes (Signed)
ANTICOAGULATION CONSULT NOTE   Pharmacy Consult for Heparin/Warfarin Indication: atrial fibrillation  Allergies  Allergen Reactions  . Aspirin Other (See Comments)    CAUSES ULCERS  . Codeine Nausea And Vomiting  . Oxycodone Nausea And Vomiting   Patient Measurements: Height: 5\' 7"  (170.2 cm) Weight: 141 lb (64 kg) IBW/kg (Calculated) : 66.1  Vital Signs: Temp: 98.5 F (36.9 C) (02/01 0717) Temp Source: Oral (02/01 0717) BP: 141/67 (02/01 0754) Pulse Rate: 83 (02/01 0754)  Labs: Recent Labs    02/06/17 0310 02/06/17 1224 02/07/17 0043 02/08/17 0223  HGB 10.6*  --  11.0* 10.6*  HCT 29.7*  --  31.9* 31.4*  PLT 567*  --  648* 667*  LABPROT 19.7*  --   --  23.0*  INR 1.69  --   --  2.05  HEPARINUNFRC 0.20* 0.64  --  0.64  CREATININE 1.00  --  1.06  --    Estimated Creatinine Clearance: 47.8 mL/min (by C-G formula based on SCr of 1.06 mg/dL).  Medical History: Past Medical History:  Diagnosis Date  . Back pain   . COPD (chronic obstructive pulmonary disease) (HCC)   . Depression   . Fatigue    a. in setting of Afib, improved following DCCV.  [name]. Hearing loss   . History of GI bleed 1976  . Hyperlipemia   . Hypertension   . Migraine   . Palpitations   . Persistent atrial fibrillation (HCC)    a. 02/2016 Echo: EF 66-65%, mild MR;  b. CHA2DS2VASc = 3-->coumadin;  c. 05/2016 TEE/DCCV: EF 45-50%, triv AI, mild to mod MR, no LA/LAA/RA/RAA thrombus, mod to sev TR-->successful DCCV.  . Seizure (HCC)   . Syncope    a. related to seizure disorder   Assessment: 84 y/o M on warfarin PTA for afib, presents to the ED with seizures, [facility] issue, required intubation 1/20-1/21.  Pharmacy was consulted at admission to hold warfarin and heparin was started after INR < 2.  Coumadin resumed 1/25, heparin continues until INR back over 2.  Refused labs d/t confusion yesterday, no issues noted overnight. Heparin level at goal, INR also therapeutic this  am so will stop heparin. CBC remains stable.   Goal of Therapy:  INR goal 2-3 Heparin level 0.3-0.7 units/ml  Monitor platelets by anticoagulation protocol: Yes   Plan:  Will give warfarin 7.5mg  tonight Daily INR for another day D/c heparin  Sheppard CoilFrank Wilson PharmD., BCPS Clinical Pharmacist 02/08/2017 8:23 AM

## 2017-02-08 NOTE — Progress Notes (Signed)
Rehab admissions - I did meet with patient's daughter this am.  I had received another [facility] after the peer to peer was completed by Dr. Cena BentonVega.  I explained everything I had done to get patient approved to the daughter.  The case manager and SW and I all talked to the daughter about pursing SNF for short term rehab outside the hospital.  Daughter expressed desire for [facility] close to patient's home.  SW to present bed offers to daughter/wife once the patient has some SNF options.  Call me for questions.  #[phone]

## 2017-02-08 NOTE — Plan of Care (Signed)
Continue current care plan, pt to go to skilled facility for rehab.

## 2017-02-08 NOTE — Care Management Important Message (Signed)
Important Message  Patient Details  Name: Zachary RavelSamuel D Peterson MRN: [id number] Date of Birth: 27-Sep-1931   Medicare Important Message Given:  Yes    Lawerance Sabalebbie Kenzi Bardwell, RN 02/08/2017, 1:43 PM

## 2017-02-08 NOTE — Clinical Social Work Note (Addendum)
[facility] does not have any beds today. [facility] has extended a bed offer. CSW left voicemail for admissions coordinator to confirm they can take patient today.  Charlynn CourtSarah Refujio Haymer, CSW [phone]  2:17 pm [facility] once authorization obtained. CSW notified [facility] of facility choice. Authorization still pending.  Charlynn CourtSarah Kendel Pesnell, CSW [phone]  3:34 pm Insurance authorization approved: [id number] rug level RVB. CSW notified SNF clinical liaison and MD.  Charlynn CourtSarah Coyle Stordahl, CSW [phone]  4:16 pm Patient will discharge tomorrow. SNF and patient's wife and granddaughter notified.   Charlynn CourtSarah Manville Rico, CSW [phone]

## 2017-02-08 NOTE — Clinical Social Work Note (Signed)
Clinical Social Work Assessment  Patient Details  Name: Zachary Peterson MRN: [id number] Date of Birth: 04-Oct-1931  Date of referral:  02/08/17               Reason for consult:  Facility Placement, Discharge Planning                Permission sought to share information with:  Facility Sport and exercise psychologist, Family Supports Permission granted to share information::  Yes, Verbal Permission Granted  Name::     Kosei Rhodes  Agency::  SNF's  Relationship::  Spouse  Contact Information:  [phone]  Housing/Transportation Living arrangements for the past 2 months:  Single Family Home Source of Information:  Medical Team, Adult Children, Spouse Patient Interpreter Needed:  None Criminal Activity/Legal Involvement Pertinent to Current Situation/Hospitalization:  No - Comment as needed Significant Relationships:  Adult Children, Spouse Lives with:  Spouse Do you feel safe going back to the place where you live?  Yes Need for family participation in patient care:  Yes (Comment)  Care giving concerns:  Insurance denied CIR. Patient's wife and daughters now agreeable to SNF placement.   Social Worker assessment / plan:  Patient not fully oriented. CSW met with patient's wife and two daughters. CSW introduced role and explained that PT recommendations would be discussed. Insurance has denied CIR admission. They are agreeable to SNF placement now. First preference is [facility], second is [facility], and third is [facility]. [facility] admissions coordinator will review referral to see if patient is appropriate for their facility and determine if they will have a bed today. CSW has started insurance authorization and will call them with facility choice once secured. No further concerns. CSW encouraged patient's wife and daughters to contact CSW as needed. CSW will continue to follow patient and his family for support and facilitate discharge to SNF once insurance  authorization obtained.  Employment status:  Retired Nurse, adult PT Recommendations:  Inpatient [place] / Referral to community resources:  [place]  Patient/Family's Response to care:  Patient not fully oriented. Patient's wife and daughters agreeable to SNF placement. Patient's family supportive and involved in patient's care. Patient's wife and daughters appreciated social work intervention.  Patient/Family's Understanding of and Emotional Response to Diagnosis, Current Treatment, and Prognosis:  Patient not fully oriented. Patient's wife and daughters have a good understanding of the reason for admission and his need for rehab prior to returning home. Patient's wife and daughters appear happy with hospital care.  Emotional Assessment Appearance:  Appears stated age Attitude/Demeanor/Rapport:  Unable to Assess Affect (typically observed):  Unable to Assess Orientation:  Oriented to Self, Oriented to Place Alcohol / Substance use:  Never Used Psych involvement (Current and /or in the community):  No (Comment)  Discharge Needs  Concerns to be addressed:  Care Coordination Readmission within the last 30 days:  No Current discharge risk:  Cognitively Impaired, Dependent with Mobility Barriers to Discharge:  [place], LCSW 02/08/2017, 11:25 AM

## 2017-02-08 NOTE — Clinical Social Work Placement (Signed)
   CLINICAL SOCIAL WORK PLACEMENT  NOTE  Date:  02/08/2017  Patient Details  Name: Zachary Peterson MRN: [id number] Date of Birth: October 28, 1931  Clinical Social Work is seeking post-discharge placement for this patient at the Skilled  Nursing Facility level of care (*CSW will initial, date and re-position this form in  chart as items are completed):  Yes   Patient/family provided with [place] Clinical Social Work Department's list of facilities offering this level of care within the geographic area requested by the patient (or if unable, by the patient's family).  Yes   Patient/family informed of their freedom to choose among providers that offer the needed level of care, that participate in Medicare, Medicaid or managed care program needed by the patient, have an available bed and are willing to accept the patient.  Yes   Patient/family informed of [facility] ownership interest in [facility] and [facility], as well as of the fact that they are under no obligation to receive care at these facilities.  PASRR submitted to EDS on 02/08/17     PASRR number received on 02/08/17     Existing PASRR number confirmed on       FL2 transmitted to all facilities in geographic area requested by pt/family on 02/08/17     FL2 transmitted to all facilities within larger geographic area on       Patient informed that his/her managed care company has contracts with or will negotiate with certain facilities, including the following:            Patient/family informed of bed offers received.  Patient chooses bed at       Physician recommends and patient chooses bed at      Patient to be transferred to   on  .  Patient to be transferred to facility by       Patient family notified on   of transfer.  Name of family member notified:        PHYSICIAN Please sign FL2     Additional Comment:    _______________________________________________ Margarito LinerSarah C Athalene Kolle, LCSW 02/08/2017, 11:29  AM

## 2017-02-08 NOTE — Progress Notes (Signed)
PROGRESS NOTE [facility]   Zachary Peterson   ZOX:[id number] DOB: November 30, 1931  DOA: 01/26/2017 PCP: Ignacia Palma., MD   Brief Narrative:  Zachary Peterson is an 84 year old male with known seizure disorder, COPD, hypertension, A. fib, and history of GI bleed presented to the emergency department with acute respiratory distress after sustaining a seizure.  After EMS was called patient had another witnessed tonic-clonic seizure, he was intubated for airway protection and he was admitted to the ICU.  Neurology was consulted and patient was loaded with Keppra, and continued on Dilantin. Patient was successfully extubated on 1/21, fail swallow eval when for modified barium and recommended mechanical soft diet.    Subjective: Somnolence resolved after discontinuation of risperdal  Assessment & Plan: Acute metabolic encephalopathy  Multifactorial - seizures, Pneumonia, hypoxia, hospital delirium  Treat underlying causes AVOID ATIVAN as this make delirium worse  Suspecting sundowning - Will discontinue the risperidal due to hypersomnolence. If agitated can use Haldol  Keep room bright  Outbed as tolerated  PT recommending CIR pending insurance approval   Acute respiratory failure with hypoxia due to aspiration PNA - stable  Wean off O2 as tolerated keep O2 sats between 89-92%  Continue Zosyn, patient was on Vancomycin due to ? Bacteremia but only 1/2 bottle grew coagulase negative, likely to be contaminant. d/c Vancomycin   The Patient completed 9 days course of Zosyn for aspiration, will continue to monitor for now  Repeated blood cultures no growth so far Felt that fluid overload also contributed, patient was treated with Lasix   Seizures - seizures free  Case discussed with neurology recommending start Vimpat, load 200 mg IV then 200 mg BID.  D/c neurology and ok to discontinue Dilantin, order placed.  Patient was not able to tolerate Keppra due to lethargy.  Patient on Valium as  well  D/c ativan   Afib  HR well controlled, on BB  Continue heparin to bridge with warfarin per pharmacy  INR continues to be subtherapeutic - difficult to control given interaction with Dilantin  INR goal 2-3 Maybe in the long term he can be transitioned to NOAC as Dilantin will be d/ced, defer to cardiology as outpatient   COPD - stable Continue nebulizer No wheezing   HTN  Continue current BP medication regimen  Dysphagia  Soft diet  Esophagram was limited but not obvious mass or strictures   Urinary retention - some urine this morning on condom cath  We will continue Flomax  If signs of retention again will place foley again consider urological consult   Hypokalemia  Resolved on last check.  DVT prophylaxis: Heparin ggt/Warfarin  Code Status: Full Code  Family Communication: None at bedside  Disposition Plan: Pt somnolent. Will d/c risperidal and plan on transitioning with improvement in mentation most likely next am to SNF or home. ` Consultants:   PCCM   Neurology   Procedures:     Antimicrobials: Anti-infectives (From admission, onward)   Start     Dose/Rate Route Frequency Ordered Stop   02/01/17 1400  vancomycin (VANCOCIN) IVPB 750 mg/150 ml premix  Status:  Discontinued     750 mg 150 mL/hr over 60 Minutes Intravenous Every 12 hours 02/01/17 1251 02/04/17 1314   01/30/17 0000  vancomycin (VANCOCIN) 500 mg in sodium chloride 0.9 % 100 mL IVPB  Status:  Discontinued     500 mg 100 mL/hr over 60 Minutes Intravenous Every 12 hours 01/29/17 1049 02/01/17 0912   01/29/17 1200  vancomycin (VANCOCIN) 1,250 mg in sodium chloride 0.9 % 250 mL IVPB     1,250 mg 166.7 mL/hr over 90 Minutes Intravenous  Once 01/29/17 1049 01/29/17 1328   01/27/17 1045  piperacillin-tazobactam (ZOSYN) IVPB 3.375 g  Status:  Discontinued     3.375 g 12.5 mL/hr over 240 Minutes Intravenous Every 8 hours 01/27/17 1033 02/04/17 1314     Objective: Vitals:   02/08/17 0756  02/08/17 0759 02/08/17 1131 02/08/17 1134  BP:   (!) 117/49 (!) 116/53  Pulse:      Resp:   17 19  Temp:   98.2 F (36.8 C)   TempSrc:   Oral   SpO2: 95% 95% 92% 92%  Weight:      Height:        Intake/Output Summary (Last 24 hours) at 02/08/2017 1522 Last data filed at 02/08/2017 1400 Gross per 24 hour  Intake 797.5 ml  Output 2350 ml  Net -1552.5 ml   Filed Weights   02/06/17 0340 02/07/17 0654 02/08/17 0330  Weight: 66 kg (145 lb 8.1 oz) 66 kg (145 lb 8.1 oz) 64 kg (141 lb)    Examination: exam unchanged when compared to 02/07/17  General: Pt in nad, alert and awake Cardiovascular: RRR, S1/S2 + Respiratory: Decrease breath sounds b/l,no wheezes, no increased wob Abdominal: Soft, NT, ND, bowel sounds Extremities: no edema  Data Reviewed: I have personally reviewed following labs and imaging studies  CBC: Recent Labs  Lab 02/04/17 0228 02/05/17 0322 02/06/17 0310 02/07/17 0043 02/08/17 0223  WBC 15.9* 14.3* 15.6* 13.3* 12.3*  HGB 11.3* 10.2* 10.6* 11.0* 10.6*  HCT 33.1* 29.6* 29.7* 31.9* 31.4*  MCV 90.9 90.8 90.3 91.4 91.8  PLT 451* 551* 567* 648* 667*   Basic Metabolic Panel: Recent Labs  Lab 02/01/17 1552 02/02/17 0624 02/05/17 0754 02/05/17 1221 02/06/17 0310 02/07/17 0043  NA  --  133* 135  --  132* 133*  K  --  3.4* 2.8*  --  3.2* 3.5  CL  --  100* 100*  --  97* 98*  CO2  --  22 23  --  23 23  GLUCOSE  --  112* 106*  --  115* 97  BUN  --  10 15  --  13 13  CREATININE  --  0.91 1.10  --  1.00 1.06  CALCIUM  --  7.9* 8.0*  --  8.0* 8.0*  MG  --  1.7  --  1.9 1.8  --   PHOS 1.5* 1.6*  --   --   --   --    GFR: Estimated Creatinine Clearance: 47.8 mL/min (by C-G formula based on SCr of 1.06 mg/dL). Liver Function Tests: No results for input(s): AST, ALT, ALKPHOS, BILITOT, PROT, ALBUMIN in the last 168 hours. No results for input(s): LIPASE, AMYLASE in the last 168 hours. No results for input(s): AMMONIA in the last 168 hours. Coagulation  Profile: Recent Labs  Lab 02/03/17 0502 02/04/17 0228 02/05/17 0322 02/06/17 0310 02/08/17 0223  INR 1.15 1.28 1.49 1.69 2.05   Cardiac Enzymes: No results for input(s): CKTOTAL, CKMB, CKMBINDEX, TROPONINI in the last 168 hours. BNP (last 3 results) No results for input(s): PROBNP in the last 8760 hours. HbA1C: No results for input(s): HGBA1C in the last 72 hours. CBG: Recent Labs  Lab 02/07/17 1954 02/07/17 2323 02/08/17 0329 02/08/17 0822 02/08/17 1222  GLUCAP 116* 143* 117* 124* 130*   Lipid Profile: No results for input(s): CHOL, HDL,  LDLCALC, TRIG, CHOLHDL, LDLDIRECT in the last 72 hours. Thyroid Function Tests: No results for input(s): TSH, T4TOTAL, FREET4, T3FREE, THYROIDAB in the last 72 hours. Anemia Panel: No results for input(s): VITAMINB12, FOLATE, FERRITIN, TIBC, IRON, RETICCTPCT in the last 72 hours. Sepsis Labs: No results for input(s): PROCALCITON, LATICACIDVEN in the last 168 hours.  Recent Results (from the past 240 hour(s))  Culture, blood (Routine X 2) w Reflex to ID Panel     Status: None   Collection Time: 02/01/17 11:05 AM  Result Value Ref Range Status   Specimen Description BLOOD LEFT HAND  Final   Special Requests IN PEDIATRIC BOTTLE Blood Culture adequate volume  Final   Culture NO GROWTH 5 DAYS  Final   Report Status 02/06/2017 FINAL  Final  Culture, blood (Routine X 2) w Reflex to ID Panel     Status: None   Collection Time: 02/01/17 11:12 AM  Result Value Ref Range Status   Specimen Description BLOOD RIGHT HAND  Final   Special Requests IN PEDIATRIC BOTTLE Blood Culture adequate volume  Final   Culture NO GROWTH 5 DAYS  Final   Report Status 02/06/2017 FINAL  Final      Radiology Studies: No results found.    Scheduled Meds: . arformoterol  15 mcg Nebulization BID  . bisoprolol  5 mg Oral BID  . budesonide (PULMICORT) nebulizer solution  0.5 mg Nebulization BID  . diazepam  2 mg Oral Q12H  . feeding supplement (ENSURE  ENLIVE)  237 mL Oral TID BM  . hydrochlorothiazide  25 mg Oral Daily  . lacosamide  200 mg Oral BID  . mouth rinse  15 mL Mouth Rinse BID  . mirtazapine  15 mg Oral QHS  . pantoprazole  40 mg Oral Daily  . senna  1 tablet Oral QHS  . tamsulosin  0.4 mg Oral QPC breakfast  . warfarin  7.5 mg Oral ONCE-1800  . Warfarin - Pharmacist Dosing Inpatient   Does not apply q1800   Continuous Infusions: . sodium chloride 250 mL (02/06/17 0930)     LOS: 12 days   Time spent: Total of 30 minutes spent with pt, greater than 50% of which was spent in discussion of  treatment, counseling and coordination of care   Penny Pia, MD Pager: Text Page via [email or web address]   If 7PM-7AM, please contact night-coverage [email or web address] 02/08/2017, 3:22 PM

## 2017-02-08 NOTE — Progress Notes (Signed)
Pt unable to void, in and out for 600cc of yellow uring

## 2017-02-08 NOTE — Care Management Note (Signed)
Case Management Note  Patient Details  Name: Zachary RavelSamuel D Peterson MRN: [id number] Date of Birth: 09/22/1931  Subjective/Objective:     Pt admitted post seizure               Action/Plan:  PTA independent from home with wife.  Daughter at bedside.  Therapy will be ordered once pt is considered stable.   Expected Discharge Date:  02/15/17               Expected Discharge Plan:  Home w Home Health Services  In-House Referral:     Discharge planning Services  CM Consult  Post Acute Care Choice:    Choice offered to:     DME Arranged:    DME Agency:     HH Arranged:    HH Agency:     Status of Service:     If discussed at [employer] Meetings, dates discussed:    Additional Comments: 02/08/2017  CIR denied post peer to peer.  Family has elected for pt to discharge to SNF.  CSW following  02/07/17 CIR recommended - however insurance declined.  Both pt and wife declined SNF and request HH.  Wife informed CM that family would provide 24/7 assistance with pt including daughter that is a CNA and grand daughter that is a Engineer, civil (consulting)nurse.  Wife states pt will discharge home via private vehicle.  Wife declined hospital bed but she is in agreement for RW - [facility]  [facility] informed of pending referral.  CM requested HH/DME orders from attending  02/01/17 Per bedside nurse - pt is mostly total care at this point.  PT/OT ordered  Cherylann Parrlaxton, Odarius Dines S, RN 02/08/2017, 3:02 PM

## 2017-02-09 LAB — PROTIME-INR
INR: 1.37
PROTHROMBIN TIME: 16.7 s — AB (ref 11.4–15.2)

## 2017-02-09 LAB — CBC
HCT: 32 % — ABNORMAL LOW (ref 39.0–52.0)
HEMOGLOBIN: 10.7 g/dL — AB (ref 13.0–17.0)
MCH: 31.2 pg (ref 26.0–34.0)
MCHC: 33.4 g/dL (ref 30.0–36.0)
MCV: 93.3 fL (ref 78.0–100.0)
Platelets: 688 10*3/uL — ABNORMAL HIGH (ref 150–400)
RBC: 3.43 MIL/uL — ABNORMAL LOW (ref 4.22–5.81)
RDW: 15.8 % — ABNORMAL HIGH (ref 11.5–15.5)
WBC: 8.9 10*3/uL (ref 4.0–10.5)

## 2017-02-09 LAB — GLUCOSE, CAPILLARY
Glucose-Capillary: 108 mg/dL — ABNORMAL HIGH (ref 65–99)
Glucose-Capillary: 110 mg/dL — ABNORMAL HIGH (ref 65–99)
Glucose-Capillary: 119 mg/dL — ABNORMAL HIGH (ref 65–99)

## 2017-02-09 MED ORDER — DIAZEPAM 2 MG PO TABS: 2.0000 mg | ORAL_TABLET | Freq: Two times a day (BID) | ORAL | 0 refills | Status: DC

## 2017-02-09 MED ORDER — TAMSULOSIN HCL 0.4 MG PO CAPS: 0.8000 mg | ORAL_CAPSULE | Freq: Every day | ORAL | 0 refills | Status: AC

## 2017-02-09 MED ORDER — WARFARIN SODIUM 10 MG PO TABS: 10.0000 mg | ORAL_TABLET | Freq: Once | ORAL | Status: DC

## 2017-02-09 MED ORDER — LACOSAMIDE 200 MG PO TABS: 200.0000 mg | ORAL_TABLET | Freq: Two times a day (BID) | ORAL | 0 refills | Status: DC

## 2017-02-09 MED ORDER — TAMSULOSIN HCL 0.4 MG PO CAPS: 0.8000 mg | ORAL_CAPSULE | Freq: Every day | ORAL | Status: DC

## 2017-02-09 NOTE — Progress Notes (Signed)
Bladder scan showed of urine, with in and out cath obtained. Pt states he feels much more comfortable

## 2017-02-09 NOTE — Progress Notes (Addendum)
ANTICOAGULATION CONSULT NOTE   Pharmacy Consult for Warfarin Indication: atrial fibrillation  Allergies  Allergen Reactions  . Aspirin Other (See Comments)    CAUSES ULCERS  . Codeine Nausea And Vomiting  . Oxycodone Nausea And Vomiting   Patient Measurements: Height: 5\' 7"  (170.2 cm) Weight: 135 lb 12.9 oz (61.6 kg) IBW/kg (Calculated) : 66.1  Vital Signs: Temp: 98.7 F (37.1 C) (02/02 0752) Temp Source: Oral (02/02 0752) BP: 116/48 (02/02 0752) Pulse Rate: 99 (02/02 0354)  Labs: Recent Labs    02/06/17 1224  02/07/17 0043 02/08/17 0223 02/09/17 0236  HGB  --    < > 11.0* 10.6* 10.7*  HCT  --   --  31.9* 31.4* 32.0*  PLT  --   --  648* 667* 688*  LABPROT  --   --   --  23.0* 16.7*  INR  --   --   --  2.05 1.37  HEPARINUNFRC 0.64  --   --  0.64  --   CREATININE  --   --  1.06  --   --    < > = values in this interval not displayed.   Estimated Creatinine Clearance: 46 mL/min (by C-G formula based on SCr of 1.06 mg/dL).  Medical History: Past Medical History:  Diagnosis Date  . Back pain   . COPD (chronic obstructive pulmonary disease) (HCC)   . Depression   . Fatigue    a. in setting of Afib, improved following DCCV.  [name]. Hearing loss   . History of GI bleed 1976  . Hyperlipemia   . Hypertension   . Migraine   . Palpitations   . Persistent atrial fibrillation (HCC)    a. 02/2016 Echo: EF 66-65%, mild MR;  b. CHA2DS2VASc = 3-->coumadin;  c. 05/2016 TEE/DCCV: EF 45-50%, triv AI, mild to mod MR, no LA/LAA/RA/RAA thrombus, mod to sev TR-->successful DCCV.  . Seizure (HCC)   . Syncope    a. related to seizure disorder   Assessment: 84 y/o M on warfarin PTA for afib, presents to the ED with seizures, [facility] same issue, required intubation 1/20-1/21.  Pharmacy was consulted at admission to hold warfarin and heparin was started after INR < 2.  Coumadin resumed 1/25, heparin stopped 2/1 with INR over 2.0.  INR subtherapeutic this morning  at 1.37 - discussed with Dr. Cena BentonVega, hold off on heparin bridge for now.  Goal of Therapy:  INR goal 2-3 Monitor platelets by anticoagulation protocol: Yes   Plan:  -Warfarin 10mg  PO x1 tonight -Daily INR -Consider heparin bridge if INR remains < 2 for prolonged time  Fredonia HighlandMichael Maccoy Haubner, PharmD, BCPS PGY-2 Cardiology Pharmacy Resident Pager: [phone] 02/09/2017

## 2017-02-09 NOTE — Clinical Social Work Placement (Signed)
   CLINICAL SOCIAL WORK PLACEMENT  NOTE  Date:  02/09/2017  Patient Details  Name: Zachary RavelSamuel D Peterson MRN: [id number] Date of Birth: 06/02/1931  Clinical Social Work is seeking post-discharge placement for this patient at the Skilled  Nursing Facility level of care (*CSW will initial, date and re-position this form in  chart as items are completed):  Yes   Patient/family provided with [facility] Clinical Social Work Department's list of facilities offering this level of care within the geographic area requested by the patient (or if unable, by the patient's family).  Yes   Patient/family informed of their freedom to choose among providers that offer the needed level of care, that participate in Medicare, Medicaid or managed care program needed by the patient, have an available bed and are willing to accept the patient.  Yes   Patient/family informed of [facility] ownership interest in [facility] and [facility], as well as of the fact that they are under no obligation to receive care at these facilities.  PASRR submitted to EDS on 02/08/17     PASRR number received on 02/08/17     Existing PASRR number confirmed on       FL2 transmitted to all facilities in geographic area requested by pt/family on 02/08/17     FL2 transmitted to all facilities within larger geographic area on       Patient informed that his/her managed care company has contracts with or will negotiate with certain facilities, including the following:        Yes   Patient/family informed of bed offers received.  [facility]     Physician recommends and patient chooses bed at      [facility] on 02[facility]09/17.  Patient to be transferred to facility by [facility]     Patient family notified on 02/09/17 of transfer.  Name of family member notified:        PHYSICIAN Please sign FL2     Additional Comment:     _______________________________________________ Maree KrabbeBridget A Adana Marik, LCSW 02/09/2017, 11:56 AM

## 2017-02-09 NOTE — Discharge Summary (Signed)
Physician Discharge Summary  Derrick RavelSamuel D Aube WUJ:[id number] DOB: May 02, 1931 DOA: 01/26/2017  PCP: Ignacia PalmaBeck, Mark C., MD  Admit date: 01/26/2017 Discharge date: 02/09/2017  Time spent: > 35 minutes  Recommendations for Outpatient Follow-up:  Pt has had some urinary retention. Will increase flomax dose on d/c Monitor INR levels  Discharge Diagnoses:  Principal Problem:   Seizure (HCC) Active Problems:   Acute hypercapnic respiratory failure (HCC)   Aspiration pneumonia (HCC)   Endotracheally intubated   Acute respiratory failure with hypoxia (HCC)   Dysphagia   Chronic obstructive pulmonary disease (HCC)   PAF (paroxysmal atrial fibrillation) (HCC)   Benign essential HTN   Hypophosphatemia   Leukocytosis   Acute blood loss anemia   Discharge Condition: stable  Diet recommendation: dysphagia 3  Filed Weights   02/07/17 0654 02/08/17 0330 02/09/17 0604  Weight: 66 kg (145 lb 8.1 oz) 64 kg (141 lb) 61.6 kg (135 lb 12.9 oz)    History of present illness:  84 year old male past medical history significant for COPD, depression, history of a GI bleed, hypertension, persistent A. fib, seizures and syncope.  Presenting on 1/19 to the ED in acute respiratory distress status post seizures.  Per the EMR and the accounts of other providers the patient had a witnessed seizure and fall later in the day EMS was called due to respiratory distress and then another witnessed tonic-clonic seizure.  Hospital Course:   Acute metabolic encephalopathy  Multifactorial - seizures, Pneumonia, hypoxia, hospital delirium  Resolving - suspect more hospital delirium at this point.  Acute respiratory failure with hypoxia due to aspiration PNA - stable  Wean off O2 as tolerated  Continue Zosyn, patient was on Vancomycin due to ? Bacteremia but only 1/2 bottle grew coagulase negative, likely to be contaminant. d/c Vancomycin   The Patient completed 9 days course of Zosyn for aspiration  Seizures -  seizures free  Pt transitioned to Vimpat  Patient was not able to tolerate Keppra due to lethargy.  Patient on Valium as well  D/c ativan   Afib  HR well controlled, on BB  Continue heparin to bridge with warfarin per pharmacy  INR continues to be subtherapeutic - difficult to control given interaction with Dilantin  INR goal 2-3 Maybe in the long term he can be transitioned to NOAC as Dilantin will be d/ced, defer to cardiology as outpatient   COPD - stable Continue nebulizer No wheezing   HTN  Continue current BP medication regimen  Dysphagia  Soft diet  Esophagram was limited but not obvious mass or strictures   Urinary retention -  - increased to flomax 0.8 mg per day  Hypokalemia  Resolved on last check.  Procedures:  none  Consultations:  Neurology  Discharge Exam: Vitals:   02/09/17 0752 02/09/17 0818  BP: (!) 116/48   Pulse: 82   Resp: 18   Temp: 98.7 F (37.1 C)   SpO2: 94% 94%    General: Pt in nad, alert and awake Cardiovascular: s1 and s2 present, no rubs or gallops Respiratory: no increased wob, no wheezes  Discharge Instructions   Discharge Instructions    Call MD for:  severe uncontrolled pain   Complete by:  As directed    Call MD for:  temperature >100.4   Complete by:  As directed    Diet - low sodium heart healthy   Complete by:  As directed    Discharge instructions   Complete by:  As directed    Please  monitor your INR levels at facility. Ensure follow up with your neurologist moving forward.   Increase activity slowly   Complete by:  As directed      Allergies as of 02/09/2017      Reactions   Aspirin Other (See Comments)   CAUSES ULCERS   Codeine Nausea And Vomiting   Oxycodone Nausea And Vomiting      Medication List    STOP taking these medications   DILANTIN 100 MG ER capsule Generic drug:  phenytoin   furosemide 20 MG tablet Commonly known as:  LASIX   HYDROcodone-acetaminophen 5-325 MG  tablet Commonly known as:  NORCO   ibuprofen 200 MG tablet Commonly known as:  ADVIL,MOTRIN     TAKE these medications   bisoprolol 5 MG tablet Commonly known as:  ZEBETA Take 0.5 tablets (2.5 mg total) by mouth 2 (two) times daily.   calcium carbonate 500 MG chewable tablet Commonly known as:  TUMS - dosed in mg elemental calcium Chew 1 tablet (200 mg of elemental calcium total) by mouth 3 (three) times daily with meals as needed for indigestion or heartburn.   diazepam 2 MG tablet Commonly known as:  VALIUM Take 1 tablet (2 mg total) by mouth every 12 (twelve) hours. What changed:    medication strength  how much to take  when to take this   ezetimibe-simvastatin 10-80 MG tablet Commonly known as:  VYTORIN Take 1 tablet by mouth daily.   fluticasone 50 MCG/ACT nasal spray Commonly known as:  FLONASE Place 2 sprays into both nostrils daily as needed for allergies.   hydroxypropyl methylcellulose / hypromellose 2.5 % ophthalmic solution Commonly known as:  ISOPTO TEARS / GONIOVISC Place 1 drop into both eyes 3 (three) times daily as needed for dry eyes.   lacosamide 200 MG Tabs tablet Commonly known as:  VIMPAT Take 1 tablet (200 mg total) by mouth 2 (two) times daily.   mirtazapine 15 MG tablet Commonly known as:  REMERON Take 15 mg by mouth at bedtime.   ondansetron 8 MG disintegrating tablet Commonly known as:  ZOFRAN ODT Take 1 tablet (8 mg total) by mouth every 8 (eight) hours as needed for nausea or vomiting.   pantoprazole 20 MG tablet Commonly known as:  PROTONIX Take 20 mg by mouth daily.   pregabalin 75 MG capsule Commonly known as:  LYRICA Take 75 mg by mouth 2 (two) times daily.   tamsulosin 0.4 MG Caps capsule Commonly known as:  FLOMAX Take 2 capsules (0.8 mg total) by mouth daily after breakfast. Start taking on:  02/10/2017   tiotropium 18 MCG inhalation capsule Commonly known as:  SPIRIVA Place 18 mcg into inhaler and inhale daily.    warfarin 2.5 MG tablet Commonly known as:  COUMADIN Take as directed. If you are unsure how to take this medication, talk to your nurse or doctor. Original instructions:  TAKE 1 OR 2 TABLETS BY MOUTH EVERY DAY OR AS PHYSICIAN INSTRUCTED BY COUMADIN CLINIC      Allergies  Allergen Reactions  . Aspirin Other (See Comments)    CAUSES ULCERS  . Codeine Nausea And Vomiting  . Oxycodone Nausea And Vomiting   Contact information for after-discharge care    Destination    [facility] .   Service:  Skilled Nursing Contact information: [address] [place] [address] [phone]               The results of significant diagnostics  from this hospitalization (including imaging, microbiology, ancillary and laboratory) are listed below for reference.    Significant Diagnostic Studies: Ct Head Wo Contrast  Result Date: 01/26/2017 CLINICAL DATA:  New onset seizure EXAM: CT HEAD WITHOUT CONTRAST TECHNIQUE: Contiguous axial images were obtained from the base of the skull through the vertex without intravenous contrast. COMPARISON:  Head CT 01/26/2017 at 3:19 p.m. FINDINGS: Brain: No mass lesion, intraparenchymal hemorrhage or extra-axial collection. No evidence of acute cortical infarct. There is periventricular hypoattenuation compatible with chronic microvascular disease. There is generalized atrophy. Old bilateral basal ganglia lacunar infarcts. Vascular: No hyperdense vessel or unexpected calcification. Skull: Normal visualized skull base, calvarium and extracranial soft tissues. Sinuses/Orbits: No sinus fluid levels or advanced mucosal thickening. No mastoid effusion. Normal orbits. IMPRESSION: Unchanged examination without acute intracranial abnormality. Generalized volume loss and sequelae of chronic ischemic microangiopathy. Electronically Signed   By: Deatra Robinson M.D.   On: 01/26/2017 22:32   Ct Head Wo Contrast  Result Date: 01/26/2017 CLINICAL  DATA:  Pt came to Ed via EMS from home. Pt had unwitnessed seizure. Found in bathroom by wife. Postictal upon EMS arrival. Pt has history of seizures and afib. EXAM: CT HEAD WITHOUT CONTRAST TECHNIQUE: Contiguous axial images were obtained from the base of the skull through the vertex without intravenous contrast. COMPARISON:  02/23/2016 FINDINGS: Brain: No acute intracranial hemorrhage. No focal mass lesion. No CT evidence of acute infarction. No midline shift or mass effect. No hydrocephalus. Basilar cisterns are patent. There are periventricular and subcortical white matter hypodensities. Generalized cortical atrophy. Vascular: No hyperdense vessel or unexpected calcification. Skull: Normal. Negative for fracture or focal lesion. Sinuses/Orbits: Paranasal sinuses and mastoid air cells are clear. Orbits are clear. Other: None. IMPRESSION: 1. No acute intracranial findings.  No change from prior. 2. Atrophy, mild ventricular dilatation and mild white matter microvascular disease again noted. Electronically Signed   By: Genevive Bi M.D.   On: 01/26/2017 15:41   Mr Brain Wo Contrast  Result Date: 01/30/2017 CLINICAL DATA:  Initial evaluation for acute seizure, history of epilepsy. EXAM: MRI HEAD WITHOUT CONTRAST TECHNIQUE: Multiplanar, multiecho pulse sequences of the brain and surrounding structures were obtained without intravenous contrast. COMPARISON:  Prior CT from 01/26/2017. FINDINGS: Brain: Study moderately degraded by motion artifact. Diffuse prominence of the CSF containing spaces compatible with generalized age-related cerebral atrophy. Patchy and confluent T2/FLAIR hyperintensity within the periventricular and deep white matter both cerebral hemispheres most consistent with chronic small vessel ischemic disease. No abnormal foci of restricted diffusion to suggest acute or subacute ischemia or changes related to seizure. No encephalomalacia to suggest chronic infarction. Gray-white matter  differentiation maintained. No evidence for acute or chronic intracranial hemorrhage. No mass lesion, midline shift or mass effect. Diffuse ventricular prominence related global parenchymal volume loss without hydrocephalus. No extra-axial fluid collection. Major dural sinuses are grossly patent. Pituitary gland suprasellar region grossly normal. Midline structures intact. Vascular: Major intracranial vascular flow voids are maintained. Skull and upper cervical spine: Craniocervical junction grossly normal. Bone marrow signal intensity within normal limits. No scalp soft tissue abnormality. Sinuses/Orbits: Globes and orbital soft tissues within normal limits. Patient status post cataract extraction bilaterally. Paranasal sinuses are clear. No mastoid effusion. Other: None. IMPRESSION: 1. No acute intracranial abnormality. 2. Moderately advanced cerebral atrophy with chronic small vessel ischemic disease. Electronically Signed   By: Rise Mu M.D.   On: 01/30/2017 04:58   Ct Abdomen Pelvis W Contrast  Result Date: 01/26/2017 CLINICAL DATA:  Nausea and  vomiting. EXAM: CT ABDOMEN AND PELVIS WITH CONTRAST TECHNIQUE: Multidetector CT imaging of the abdomen and pelvis was performed using the standard protocol following bolus administration of intravenous contrast. CONTRAST:  ISOVUE-300 IOPAMIDOL (ISOVUE-300) INJECTION 61% COMPARISON:  04/12/2016 scratch set 04/13/2014 FINDINGS: Lower chest: No acute abnormality. Hepatobiliary: No suspicious liver abnormality. Numerous tiny stones are identified within the gallbladder. No gallbladder wall thickening or pericholecystic fluid. No biliary dilatation identified. Pancreas: Unremarkable. No pancreatic ductal dilatation or surrounding inflammatory changes. Spleen: Normal in size without focal abnormality. Adrenals/Urinary Tract: The adrenal glands are normal. Unremarkable appearance of both kidneys. No mass or hydronephrosis. Moderate distension of the  urinary bladder. Stomach/Bowel: Small hiatal hernia. The stomach has a normal course and caliber. No bowel obstruction. Scattered colonic diverticula identified. There is mild wall thickening from the distal transverse colon to the sigmoid colon. No pneumatosis or perforation. No significant fat stranding or free fluid. Vascular/Lymphatic: Aortic atherosclerosis. No aneurysm. No adenopathy within the abdomen or pelvis. Reproductive: Prostate gland measures 5.6 by 3.7 by 3.6 cm (volume = 39 cm^3). Other: There is a left inguinal hernia which contains a nonobstructed loop of small bowel, image 82 of series 3. Status post repair of right inguinal hernia. No free fluid or fluid collections identified. Musculoskeletal: Mild scoliosis and degenerative disc disease noted within the lumbar spine. IMPRESSION: 1. Mild wall thickening involving the left colon noted. The appearance is nonspecific in may reflect incomplete distention. Inflammatory or infectious colitis not excluded. Clinical correlation advise. 2. Left inguinal hernia containing nonobstructed loops of small bowel. 3. Gallstones.  No gallbladder wall thickening identified. 4.  Aortic Atherosclerosis (ICD10-I70.0). 5. Scoliosis and lumbar spondylosis. Electronically Signed   By: Signa Kell M.D.   On: 01/26/2017 22:37   Dg Esophagus  Result Date: 02/04/2017 CLINICAL DATA:  Possible esophageal stricture EXAM: ESOPHOGRAM/BARIUM SWALLOW TECHNIQUE: Single contrast examination was performed using  thin barium. FLUOROSCOPY TIME:  Fluoroscopy Time:  2 minutes 24 seconds Radiation Exposure Index (if provided by the fluoroscopic device): 13.2 mGy Number of Acquired Spot Images: 5 COMPARISON:  None. FINDINGS: Markedly limited evaluation due to patient's inability to follow directions. Only occasional swallows of contrast passed into the esophagus. No large esophageal mass. No definite esophageal narrowing or stricture. IMPRESSION: Markedly limited evaluation due to  the patient's inability to follow directions. No large esophageal mass or definite esophageal narrowing or stricture. These results were called by telephone at the time of interpretation on 02/04/2017 at 12:27 pm to Dr. Lenox Ponds, who verbally acknowledged these results. Electronically Signed   By: Charline Bills M.D.   On: 02/04/2017 12:37   Dg Chest Port 1 View  Result Date: 02/02/2017 CLINICAL DATA:  Shortness of breath EXAM: PORTABLE CHEST 1 VIEW COMPARISON:  February 01, 2017 FINDINGS: The cardiomediastinal silhouette is stable. No pneumothorax. Patchy opacity in left base is stable. Increased infiltrate in the right base. No other interval changes. IMPRESSION: Increasing infiltrate in the right base. Stable patchy opacity in the left base. Electronically Signed   By: Gerome Sam III M.D   On: 02/02/2017 07:48   Dg Chest Port 1 View  Result Date: 02/01/2017 CLINICAL DATA:  Aspiration. EXAM: PORTABLE CHEST 1 VIEW COMPARISON:  01/29/2017. FINDINGS: Heart size normal. Diffuse bilateral pulmonary interstitial prominence noted. These changes may be related interstitial edema and/or pneumonitis. No pleural effusion or pneumothorax. No acute bony abnormality. IMPRESSION: Diffuse bilateral pulmonary interstitial prominence noted. These changes may be related to interstitial edema and/or pneumonitis. Electronically Signed  ByMaisie Fus  Register   On: 02/01/2017 08:44   Dg Chest Port 1 View  Result Date: 01/29/2017 CLINICAL DATA:  Seizure with possible aspiration. EXAM: PORTABLE CHEST 1 VIEW COMPARISON:  01/28/2017 FINDINGS: Lower lung volumes with bibasilar patchy opacity likely atelectasis. No dense focal airspace consolidation. No overt pulmonary edema or pleural effusion. Cardiopericardial silhouette is at upper limits of normal for size. The visualized bony structures of the thorax are intact. Telemetry leads overlie the chest. IMPRESSION: Lower lung volumes with increase in basilar  atelectasis. Electronically Signed   By: Kennith Center M.D.   On: 01/29/2017 10:19   Dg Chest Port 1 View  Result Date: 01/28/2017 CLINICAL DATA:  Respiratory failure EXAM: PORTABLE CHEST 1 VIEW COMPARISON:  01/26/2017 FINDINGS: Normal heart size. Endotracheal and NG tubes stable. Subsegmental atelectasis at the left base has increased. Lungs otherwise clear. No pneumothorax. IMPRESSION: Increased subsegmental atelectasis at the left base. Otherwise clear lungs. Electronically Signed   By: Jolaine Click M.D.   On: 01/28/2017 07:30   Dg Chest Portable 1 View  Result Date: 01/27/2017 CLINICAL DATA:  Endotracheal intubation EXAM: PORTABLE CHEST 1 VIEW COMPARISON:  01/26/2017 at 8:02 p.m. FINDINGS: Endotracheal tube tip is at the level of the clavicular heads. Orogastric tube tip is below the diaphragm. Bibasilar atelectasis. Unchanged cardiomediastinal contours. IMPRESSION: Endotracheal tube tip at the level of the clavicular heads. Electronically Signed   By: Deatra Robinson M.D.   On: 01/27/2017 00:17   Dg Chest Portable 1 View  Result Date: 01/26/2017 CLINICAL DATA:  Seizures.  Shortness of breath. EXAM: PORTABLE CHEST 1 VIEW COMPARISON:  04/13/2014 FINDINGS: The heart size and mediastinal contours are within normal limits. Both lungs are clear. The visualized skeletal structures are unremarkable. IMPRESSION: No active disease. Electronically Signed   By: Signa Kell M.D.   On: 01/26/2017 20:39   Dg Abd Portable 1 View  Result Date: 01/27/2017 CLINICAL DATA:  Orogastric tube placement EXAM: PORTABLE ABDOMEN - 1 VIEW COMPARISON:  None. FINDINGS: Orogastric tube tip and side port are in the stomach. The tip is probably near the gastroduodenal junction. IMPRESSION: Orogastric tube tip and side-port in the stomach. Electronically Signed   By: Deatra Robinson M.D.   On: 01/27/2017 00:19   Dg Swallowing Func-speech Pathology  Result Date: 02/03/2017 Objective Swallowing Evaluation: Type of Study:  MBS-Modified Barium Swallow Study  Patient Details Name: RAHSHAWN REMO MRN: [id number] Date of Birth: November 07, 1931 Today's Date: 02/03/2017 Time: SLP Start Time (ACUTE ONLY): 1105 -SLP Stop Time (ACUTE ONLY): 1125 SLP Time Calculation (min) (ACUTE ONLY): 20 min Past Medical History: Past Medical History: Diagnosis Date . Back pain  . COPD (chronic obstructive pulmonary disease) (HCC)  . Depression  . Fatigue   a. in setting of Afib, improved following DCCV. [name] Hearing loss  . History of GI bleed 1976 . Hyperlipemia  . Hypertension  . Migraine  . Palpitations  . Persistent atrial fibrillation (HCC)   a. 02/2016 Echo: EF 66-65%, mild MR;  b. CHA2DS2VASc = 3-->coumadin;  c. 05/2016 TEE/DCCV: EF 45-50%, triv AI, mild to mod MR, no LA/LAA/RA/RAA thrombus, mod to sev TR-->successful DCCV. . Seizure (HCC)  . Syncope   a. related to seizure disorder Past Surgical History: Past Surgical History: Procedure Laterality Date . APPENDECTOMY   . BACK SURGERY   . CARDIOVERSION N/A 06/06/2016  Procedure: CARDIOVERSION;  Surgeon: Yvonne Kendall, MD;  Location: [facility] ORS;  Service: Cardiovascular;  Laterality: N/A; . CATARACT EXTRACTION   . hemmoridectomy   .  HERNIA REPAIR   . TEE WITHOUT CARDIOVERSION N/A 06/06/2016  Procedure: TRANSESOPHAGEAL ECHOCARDIOGRAM (TEE);  Surgeon: Yvonne Kendall, MD;  Location: [facility] ORS;  Service: Cardiovascular;  Laterality: N/A; HPI: 84 y.o. male with known seizure disorder admitted through ED in respiratory distress following seizure requiring intubation. ETT 1/19-21.  Subjective: "I'm good if I get to eat." Assessment / Plan / Recommendation CHL IP CLINICAL IMPRESSIONS 02/03/2017 Clinical Impression Patient presents with mild oropharyngeal dysphagia and suspected primary esophageal dysphagia. Oral stage characterized by impaired mastication and prolonged bolus preparation due to absence of dentition. Oral containment adequate without premature spillage. Swallow initiation at the base of tongue for solids,  valleculae for liquids. Pharyngeal stage characterized by mildly decreased base of tongue retraction and pharyngeal constriction. Hyolaryngeal excursion appears adequate. Decreased amplitude/duration of cricopharyngeal opening and suspected suboptimal esophageal pressure impedes full clearance resulting in residue in the valleculae and pyriform sinuses, solids>liquids. Chin tuck with dry swallow reduces residue. Occasional shallow penetration of residue after the swallow and with larger, consecutive sips of thin liquids. Pt clears with cued throat clear. Intermittent backflow through the cervical esophagus and to the pharynx. Recommend to consume dys 3 (mechanical soft) diet with thin liquids, medications one at a time with liquid, with mild-moderate risk for aspiration. Clear throat intermittently, dry swallow with chin tuck after solids to decrease residue. SLP reviewed results and recommendations with patient immediately following the exam. No prior esophageal examinations are found in chart, however pt tells SLP he was told to have his esophagus stretched (unclear when or by whom) but did not have this done. Recommend further esophageal w/u.  SLP Visit Diagnosis Dysphagia, pharyngeal phase (R13.13);Dysphagia, pharyngoesophageal phase (R13.14) Attention and concentration deficit following -- Frontal lobe and executive function deficit following -- Impact on safety and function Mild aspiration risk;Moderate aspiration risk   CHL IP TREATMENT RECOMMENDATION 02/03/2017 Treatment Recommendations Therapy as outlined in treatment plan below   Prognosis 02/03/2017 Prognosis for Safe Diet Advancement Good Barriers to Reach Goals -- Barriers/Prognosis Comment -- CHL IP DIET RECOMMENDATION 02/03/2017 SLP Diet Recommendations Dysphagia 3 (Mech soft) solids;Thin liquid Liquid Administration via Cup;Straw Medication Administration Whole meds with liquid Compensations Slow rate;Small sips/bites;Multiple dry swallows after each  bite/sip;Clear throat intermittently;Follow solids with liquid Postural Changes --   CHL IP OTHER RECOMMENDATIONS 02/03/2017 Recommended Consults Consider esophageal assessment;Consider ENT evaluation Oral Care Recommendations Oral care BID Other Recommendations --   CHL IP FOLLOW UP RECOMMENDATIONS 02/03/2017 Follow up Recommendations Skilled Nursing facility   [facility] IP FREQUENCY AND DURATION 02/03/2017 Speech Therapy Frequency (ACUTE ONLY) min 2x/week Treatment Duration 1 week      CHL IP ORAL PHASE 02/03/2017 Oral Phase Impaired Oral - Pudding Teaspoon -- Oral - Pudding Cup -- Oral - Honey Teaspoon -- Oral - Honey Cup -- Oral - Nectar Teaspoon -- Oral - Nectar Cup -- Oral - Nectar Straw -- Oral - Thin Teaspoon -- Oral - Thin Cup -- Oral - Thin Straw -- Oral - Puree -- Oral - Mech Soft Impaired mastication Oral - Regular -- Oral - Multi-Consistency -- Oral - Pill -- Oral Phase - Comment --  CHL IP PHARYNGEAL PHASE 02/03/2017 Pharyngeal Phase Impaired Pharyngeal- Pudding Teaspoon -- Pharyngeal -- Pharyngeal- Pudding Cup -- Pharyngeal -- Pharyngeal- Honey Teaspoon -- Pharyngeal -- Pharyngeal- Honey Cup -- Pharyngeal -- Pharyngeal- Nectar Teaspoon -- Pharyngeal -- Pharyngeal- Nectar Cup -- Pharyngeal -- Pharyngeal- Nectar Straw -- Pharyngeal -- Pharyngeal- Thin Teaspoon -- Pharyngeal -- Pharyngeal- Thin Cup -- Pharyngeal -- Pharyngeal- Thin Straw  Penetration/Aspiration during swallow;Delayed swallow initiation-vallecula;Reduced pharyngeal peristalsis;Reduced tongue base retraction;Pharyngeal residue - valleculae;Pharyngeal residue - pyriform;Pharyngeal residue - cp segment;Penetration/Apiration after swallow Pharyngeal Material enters airway, remains ABOVE vocal cords and not ejected out Pharyngeal- Puree Reduced tongue base retraction;Reduced pharyngeal peristalsis;Penetration/Apiration after swallow;Pharyngeal residue - valleculae;Pharyngeal residue - pyriform;Pharyngeal residue - posterior pharnyx;Pharyngeal residue - cp  segment;Other (Comment) Pharyngeal Material enters airway, remains ABOVE vocal cords and not ejected out Pharyngeal- Mechanical Soft Reduced pharyngeal peristalsis;Reduced tongue base retraction;Pharyngeal residue - valleculae;Pharyngeal residue - posterior pharnyx;Pharyngeal residue - cp segment;Pharyngeal residue - pyriform;Compensatory strategies attempted (with notebox) Pharyngeal -- Pharyngeal- Regular -- Pharyngeal -- Pharyngeal- Multi-consistency -- Pharyngeal -- Pharyngeal- Pill -- Pharyngeal -- Pharyngeal Comment --  CHL IP CERVICAL ESOPHAGEAL PHASE 02/03/2017 Cervical Esophageal Phase Impaired Pudding Teaspoon -- Pudding Cup -- Honey Teaspoon -- Honey Cup -- Nectar Teaspoon -- Nectar Cup -- Nectar Straw -- Thin Teaspoon -- Thin Cup -- Thin Straw Reduced cricopharyngeal relaxation;Prominent cricopharyngeal segment;Esophageal backflow into cervical esophagus Puree Reduced cricopharyngeal relaxation;Prominent cricopharyngeal segment;Esophageal backflow into cervical esophagus Mechanical Soft Reduced cricopharyngeal relaxation;Prominent cricopharyngeal segment Regular -- Multi-consistency -- Pill -- Cervical Esophageal Comment -- Rondel Baton, MS, CCC-SLP Speech-Language Pathologist [phone] No flowsheet data found. Arlana Lindau 02/03/2017, 12:30 PM               Microbiology: Recent Results (from the past 240 hour(s))  Culture, blood (Routine X 2) w Reflex to ID Panel     Status: None   Collection Time: 02/01/17 11:05 AM  Result Value Ref Range Status   Specimen Description BLOOD LEFT HAND  Final   Special Requests IN PEDIATRIC BOTTLE Blood Culture adequate volume  Final   Culture NO GROWTH 5 DAYS  Final   Report Status 02/06/2017 FINAL  Final  Culture, blood (Routine X 2) w Reflex to ID Panel     Status: None   Collection Time: 02/01/17 11:12 AM  Result Value Ref Range Status   Specimen Description BLOOD RIGHT HAND  Final   Special Requests IN PEDIATRIC BOTTLE Blood Culture adequate volume   Final   Culture NO GROWTH 5 DAYS  Final   Report Status 02/06/2017 FINAL  Final     Labs: Basic Metabolic Panel: Recent Labs  Lab 02/05/17 0754 02/05/17 1221 02/06/17 0310 02/07/17 0043  NA 135  --  132* 133*  K 2.8*  --  3.2* 3.5  CL 100*  --  97* 98*  CO2 23  --  23 23  GLUCOSE 106*  --  115* 97  BUN 15  --  13 13  CREATININE 1.10  --  1.00 1.06  CALCIUM 8.0*  --  8.0* 8.0*  MG  --  1.9 1.8  --    Liver Function Tests: No results for input(s): AST, ALT, ALKPHOS, BILITOT, PROT, ALBUMIN in the last 168 hours. No results for input(s): LIPASE, AMYLASE in the last 168 hours. No results for input(s): AMMONIA in the last 168 hours. CBC: Recent Labs  Lab 02/05/17 0322 02/06/17 0310 02/07/17 0043 02/08/17 0223 02/09/17 0236  WBC 14.3* 15.6* 13.3* 12.3* 8.9  HGB 10.2* 10.6* 11.0* 10.6* 10.7*  HCT 29.6* 29.7* 31.9* 31.4* 32.0*  MCV 90.8 90.3 91.4 91.8 93.3  PLT 551* 567* 648* 667* 688*   Cardiac Enzymes: No results for input(s): CKTOTAL, CKMB, CKMBINDEX, TROPONINI in the last 168 hours. BNP: BNP (last 3 results) No results for input(s): BNP in the last 8760 hours.  ProBNP (last 3 results) No results for input(s):  PROBNP in the last 8760 hours.  CBG: Recent Labs  Lab 02/08/17 1711 02/08/17 1954 02/08/17 2359 02/09/17 0354 02/09/17 0837  GLUCAP 142* 159* 126* 108* 110*    Signed:  Penny Pia MD.  [facility] 02/09/2017, 10:37 AM

## 2017-02-09 NOTE — Clinical Social Work Note (Signed)
Clinical Social Worker facilitated patient discharge including contacting patient family and facility to confirm patient discharge plans.  Clinical information faxed to facility and family agreeable with plan.  CSW arranged ambulance transport via [facility] to [facility] .  RN to call [phone] for report prior to discharge. Pt will go to room 3A.  Clinical Social Worker will sign off for now as social work intervention is no longer needed. Please consult us again if new need arises.  GrayBridget Arielle Eber, [place] [phone]

## 2017-02-09 NOTE — Progress Notes (Signed)
[facility]. Pt's wife informed that transport has been called.

## 2017-02-15 ENCOUNTER — Other Ambulatory Visit: Payer: Self-pay

## 2017-02-15 ENCOUNTER — Inpatient Hospital Stay
Admission: EM | Admit: 2017-02-15 | Discharge: 2017-02-23 | DRG: 871 | Disposition: A | Discharge status: Skilled Nursing Facility | Payer: Medicare HMO | Attending: Internal Medicine | Admitting: Internal Medicine

## 2017-02-15 ENCOUNTER — Emergency Department: Payer: Medicare HMO

## 2017-02-15 DIAGNOSIS — J189 Pneumonia, unspecified organism: Secondary | ICD-10-CM | POA: Diagnosis present

## 2017-02-15 DIAGNOSIS — Z888 Allergy status to other drugs, medicaments and biological substances status: Secondary | ICD-10-CM

## 2017-02-15 DIAGNOSIS — E43 Unspecified severe protein-calorie malnutrition: Secondary | ICD-10-CM

## 2017-02-15 DIAGNOSIS — E871 Hypo-osmolality and hyponatremia: Secondary | ICD-10-CM | POA: Diagnosis present

## 2017-02-15 DIAGNOSIS — J9621 Acute and chronic respiratory failure with hypoxia: Secondary | ICD-10-CM | POA: Diagnosis present

## 2017-02-15 DIAGNOSIS — R338 Other retention of urine: Secondary | ICD-10-CM | POA: Diagnosis present

## 2017-02-15 DIAGNOSIS — Z66 Do not resuscitate: Secondary | ICD-10-CM | POA: Diagnosis not present

## 2017-02-15 DIAGNOSIS — D631 Anemia in chronic kidney disease: Secondary | ICD-10-CM | POA: Diagnosis present

## 2017-02-15 DIAGNOSIS — I480 Paroxysmal atrial fibrillation: Secondary | ICD-10-CM | POA: Diagnosis present

## 2017-02-15 DIAGNOSIS — R402413 Glasgow coma scale score 13-15, at hospital admission: Secondary | ICD-10-CM | POA: Diagnosis present

## 2017-02-15 DIAGNOSIS — Z6820 Body mass index (BMI) 20.0-20.9, adult: Secondary | ICD-10-CM

## 2017-02-15 DIAGNOSIS — M549 Dorsalgia, unspecified: Secondary | ICD-10-CM | POA: Diagnosis present

## 2017-02-15 DIAGNOSIS — R531 Weakness: Secondary | ICD-10-CM | POA: Diagnosis not present

## 2017-02-15 DIAGNOSIS — Z79899 Other long term (current) drug therapy: Secondary | ICD-10-CM

## 2017-02-15 DIAGNOSIS — I482 Chronic atrial fibrillation: Secondary | ICD-10-CM | POA: Diagnosis present

## 2017-02-15 DIAGNOSIS — J440 Chronic obstructive pulmonary disease with acute lower respiratory infection: Secondary | ICD-10-CM | POA: Diagnosis present

## 2017-02-15 DIAGNOSIS — R6521 Severe sepsis with septic shock: Secondary | ICD-10-CM | POA: Diagnosis present

## 2017-02-15 DIAGNOSIS — H9190 Unspecified hearing loss, unspecified ear: Secondary | ICD-10-CM | POA: Diagnosis present

## 2017-02-15 DIAGNOSIS — N183 Chronic kidney disease, stage 3 (moderate): Secondary | ICD-10-CM | POA: Diagnosis present

## 2017-02-15 DIAGNOSIS — N401 Enlarged prostate with lower urinary tract symptoms: Secondary | ICD-10-CM | POA: Diagnosis present

## 2017-02-15 DIAGNOSIS — R1310 Dysphagia, unspecified: Secondary | ICD-10-CM | POA: Diagnosis present

## 2017-02-15 DIAGNOSIS — I481 Persistent atrial fibrillation: Secondary | ICD-10-CM | POA: Diagnosis present

## 2017-02-15 DIAGNOSIS — R739 Hyperglycemia, unspecified: Secondary | ICD-10-CM | POA: Diagnosis present

## 2017-02-15 DIAGNOSIS — I129 Hypertensive chronic kidney disease with stage 1 through stage 4 chronic kidney disease, or unspecified chronic kidney disease: Secondary | ICD-10-CM | POA: Diagnosis present

## 2017-02-15 DIAGNOSIS — R059 Cough, unspecified: Secondary | ICD-10-CM

## 2017-02-15 DIAGNOSIS — K224 Dyskinesia of esophagus: Secondary | ICD-10-CM | POA: Diagnosis present

## 2017-02-15 DIAGNOSIS — G92 Toxic encephalopathy: Secondary | ICD-10-CM | POA: Diagnosis present

## 2017-02-15 DIAGNOSIS — J441 Chronic obstructive pulmonary disease with (acute) exacerbation: Secondary | ICD-10-CM | POA: Diagnosis present

## 2017-02-15 DIAGNOSIS — Z7901 Long term (current) use of anticoagulants: Secondary | ICD-10-CM

## 2017-02-15 DIAGNOSIS — Y95 Nosocomial condition: Secondary | ICD-10-CM | POA: Diagnosis present

## 2017-02-15 DIAGNOSIS — T17820A Food in other parts of respiratory tract causing asphyxiation, initial encounter: Secondary | ICD-10-CM | POA: Diagnosis present

## 2017-02-15 DIAGNOSIS — E785 Hyperlipidemia, unspecified: Secondary | ICD-10-CM | POA: Diagnosis present

## 2017-02-15 DIAGNOSIS — R05 Cough: Secondary | ICD-10-CM

## 2017-02-15 DIAGNOSIS — G40909 Epilepsy, unspecified, not intractable, without status epilepticus: Secondary | ICD-10-CM | POA: Diagnosis present

## 2017-02-15 DIAGNOSIS — Z515 Encounter for palliative care: Secondary | ICD-10-CM | POA: Diagnosis not present

## 2017-02-15 DIAGNOSIS — R197 Diarrhea, unspecified: Secondary | ICD-10-CM | POA: Diagnosis present

## 2017-02-15 DIAGNOSIS — Z9981 Dependence on supplemental oxygen: Secondary | ICD-10-CM | POA: Diagnosis not present

## 2017-02-15 DIAGNOSIS — X58XXXA Exposure to other specified factors, initial encounter: Secondary | ICD-10-CM | POA: Diagnosis present

## 2017-02-15 DIAGNOSIS — R627 Adult failure to thrive: Secondary | ICD-10-CM | POA: Diagnosis present

## 2017-02-15 DIAGNOSIS — Z885 Allergy status to narcotic agent status: Secondary | ICD-10-CM

## 2017-02-15 DIAGNOSIS — J690 Pneumonitis due to inhalation of food and vomit: Secondary | ICD-10-CM | POA: Diagnosis present

## 2017-02-15 DIAGNOSIS — F329 Major depressive disorder, single episode, unspecified: Secondary | ICD-10-CM | POA: Diagnosis present

## 2017-02-15 DIAGNOSIS — J449 Chronic obstructive pulmonary disease, unspecified: Secondary | ICD-10-CM | POA: Diagnosis not present

## 2017-02-15 DIAGNOSIS — Z87891 Personal history of nicotine dependence: Secondary | ICD-10-CM

## 2017-02-15 DIAGNOSIS — Z886 Allergy status to analgesic agent status: Secondary | ICD-10-CM

## 2017-02-15 DIAGNOSIS — A419 Sepsis, unspecified organism: Principal | ICD-10-CM | POA: Diagnosis present

## 2017-02-15 DIAGNOSIS — D72819 Decreased white blood cell count, unspecified: Secondary | ICD-10-CM | POA: Diagnosis present

## 2017-02-15 LAB — CBC WITH DIFFERENTIAL/PLATELET
BASOS PCT: 1 %
Basophils Absolute: 0.1 10*3/uL (ref 0–0.1)
EOS ABS: 0 10*3/uL (ref 0–0.7)
EOS PCT: 0 %
HCT: 36.2 % — ABNORMAL LOW (ref 40.0–52.0)
HEMOGLOBIN: 12.4 g/dL — AB (ref 13.0–18.0)
LYMPHS ABS: 1.1 10*3/uL (ref 1.0–3.6)
Lymphocytes Relative: 12 %
MCH: 32.2 pg (ref 26.0–34.0)
MCHC: 34.2 g/dL (ref 32.0–36.0)
MCV: 94.2 fL (ref 80.0–100.0)
MONOS PCT: 8 %
Monocytes Absolute: 0.7 10*3/uL (ref 0.2–1.0)
NEUTROS PCT: 79 %
Neutro Abs: 7.3 10*3/uL — ABNORMAL HIGH (ref 1.4–6.5)
PLATELETS: 588 10*3/uL — AB (ref 150–440)
RBC: 3.85 MIL/uL — ABNORMAL LOW (ref 4.40–5.90)
RDW: 15.5 % — AB (ref 11.5–14.5)
WBC: 9.2 10*3/uL (ref 3.8–10.6)

## 2017-02-15 LAB — URINALYSIS, COMPLETE (UACMP) WITH MICROSCOPIC
Bacteria, UA: NONE SEEN
Bilirubin Urine: NEGATIVE
GLUCOSE, UA: NEGATIVE mg/dL
Ketones, ur: NEGATIVE mg/dL
NITRITE: NEGATIVE
PH: 5 (ref 5.0–8.0)
Protein, ur: 30 mg/dL — AB
SPECIFIC GRAVITY, URINE: 1.018 (ref 1.005–1.030)
Squamous Epithelial / LPF: NONE SEEN

## 2017-02-15 LAB — URINE DRUG SCREEN, QUALITATIVE (ARMC ONLY)
Amphetamines, Ur Screen: NOT DETECTED
BARBITURATES, UR SCREEN: NOT DETECTED
Benzodiazepine, Ur Scrn: POSITIVE — AB
CANNABINOID 50 NG, UR ~~LOC~~: NOT DETECTED
COCAINE METABOLITE, UR ~~LOC~~: NOT DETECTED
MDMA (ECSTASY) UR SCREEN: NOT DETECTED
Methadone Scn, Ur: NOT DETECTED
OPIATE, UR SCREEN: NOT DETECTED
PHENCYCLIDINE (PCP) UR S: NOT DETECTED
TRICYCLIC, UR SCREEN: NOT DETECTED

## 2017-02-15 LAB — BLOOD GAS, VENOUS
ACID-BASE EXCESS: 0.5 mmol/L (ref 0.0–2.0)
Bicarbonate: 27 mmol/L (ref 20.0–28.0)
O2 Saturation: 59.1 %
PCO2 VEN: 50 mmHg (ref 44.0–60.0)
PH VEN: 7.34 (ref 7.250–7.430)
Patient temperature: 37
pO2, Ven: 33 mmHg (ref 32.0–45.0)

## 2017-02-15 LAB — COMPREHENSIVE METABOLIC PANEL
ALT: 16 U/L — ABNORMAL LOW (ref 17–63)
AST: 30 U/L (ref 15–41)
Albumin: 3.3 g/dL — ABNORMAL LOW (ref 3.5–5.0)
Alkaline Phosphatase: 100 U/L (ref 38–126)
Anion gap: 10 (ref 5–15)
BILIRUBIN TOTAL: 0.5 mg/dL (ref 0.3–1.2)
BUN: 23 mg/dL — ABNORMAL HIGH (ref 6–20)
CHLORIDE: 101 mmol/L (ref 101–111)
CO2: 25 mmol/L (ref 22–32)
CREATININE: 1.45 mg/dL — AB (ref 0.61–1.24)
Calcium: 8.8 mg/dL — ABNORMAL LOW (ref 8.9–10.3)
GFR, EST AFRICAN AMERICAN: 50 mL/min — AB (ref >60)
GFR, EST NON AFRICAN AMERICAN: 43 mL/min — AB (ref >60)
Glucose, Bld: 140 mg/dL — ABNORMAL HIGH (ref 65–99)
Potassium: 4.4 mmol/L (ref 3.5–5.1)
Sodium: 136 mmol/L (ref 135–145)
TOTAL PROTEIN: 8.2 g/dL — AB (ref 6.5–8.1)

## 2017-02-15 LAB — TROPONIN I

## 2017-02-15 LAB — PROTIME-INR
INR: 1.09
PROTHROMBIN TIME: 14 s (ref 11.4–15.2)

## 2017-02-15 LAB — LACTIC ACID, PLASMA
LACTIC ACID, VENOUS: 1.9 mmol/L (ref 0.5–1.9)
Lactic Acid, Venous: 1.3 mmol/L (ref 0.5–1.9)

## 2017-02-15 LAB — MRSA PCR SCREENING: MRSA by PCR: NEGATIVE

## 2017-02-15 LAB — BRAIN NATRIURETIC PEPTIDE: B NATRIURETIC PEPTIDE 5: 140 pg/mL — AB (ref 0.0–100.0)

## 2017-02-15 LAB — PROCALCITONIN: Procalcitonin: 0.2 ng/mL

## 2017-02-15 MED ORDER — TAMSULOSIN HCL 0.4 MG PO CAPS
0.8000 mg | ORAL_CAPSULE | Freq: Every day | ORAL | Status: DC
  Administered 2017-02-16 – 2017-02-22 (×7): 0.8 mg via ORAL
  Filled 2017-02-15 (×7): qty 2

## 2017-02-15 MED ORDER — FLUTICASONE PROPIONATE 50 MCG/ACT NA SUSP
2.0000 | Freq: Every day | NASAL | Status: DC | PRN
  Filled 2017-02-15: qty 16

## 2017-02-15 MED ORDER — DIAZEPAM 2 MG PO TABS
2.0000 mg | ORAL_TABLET | Freq: Two times a day (BID) | ORAL | Status: DC
  Administered 2017-02-15 – 2017-02-20 (×7): 2 mg via ORAL
  Filled 2017-02-15 (×8): qty 1

## 2017-02-15 MED ORDER — METOPROLOL TARTRATE 5 MG/5ML IV SOLN
5.0000 mg | Freq: Four times a day (QID) | INTRAVENOUS | Status: DC | PRN
  Administered 2017-02-19: 5 mg via INTRAVENOUS
  Filled 2017-02-15: qty 5

## 2017-02-15 MED ORDER — BUDESONIDE 0.5 MG/2ML IN SUSP
0.5000 mg | Freq: Two times a day (BID) | RESPIRATORY_TRACT | Status: DC
  Administered 2017-02-15 – 2017-02-22 (×14): 0.5 mg via RESPIRATORY_TRACT
  Filled 2017-02-15 (×16): qty 2

## 2017-02-15 MED ORDER — POLYVINYL ALCOHOL 1.4 % OP SOLN
1.0000 [drp] | Freq: Three times a day (TID) | OPHTHALMIC | Status: DC | PRN
  Filled 2017-02-15: qty 15

## 2017-02-15 MED ORDER — EZETIMIBE-SIMVASTATIN 10-80 MG PO TABS: 1.0000 | ORAL_TABLET | Freq: Every day | ORAL | Status: DC

## 2017-02-15 MED ORDER — DEXTROSE 5 % IV SOLN
1.0000 g | Freq: Three times a day (TID) | INTRAVENOUS | Status: DC
  Filled 2017-02-15 (×4): qty 1

## 2017-02-15 MED ORDER — VANCOMYCIN HCL IN DEXTROSE 1-5 GM/200ML-% IV SOLN
1000.0000 mg | INTRAVENOUS | Status: DC
  Administered 2017-02-16: 03:00:00 1000 mg via INTRAVENOUS
  Filled 2017-02-15: qty 200

## 2017-02-15 MED ORDER — TIOTROPIUM BROMIDE MONOHYDRATE 18 MCG IN CAPS
18.0000 ug | ORAL_CAPSULE | Freq: Every day | RESPIRATORY_TRACT | Status: DC
  Administered 2017-02-16 – 2017-02-22 (×7): 18 ug via RESPIRATORY_TRACT
  Filled 2017-02-15 (×2): qty 5

## 2017-02-15 MED ORDER — SODIUM CHLORIDE 0.9 % IV BOLUS (SEPSIS)
1000.0000 mL | Freq: Once | INTRAVENOUS | Status: AC
  Administered 2017-02-15: 1000 mL via INTRAVENOUS

## 2017-02-15 MED ORDER — CALCIUM CARBONATE ANTACID 500 MG PO CHEW: 1.0000 | CHEWABLE_TABLET | Freq: Three times a day (TID) | ORAL | Status: DC | PRN

## 2017-02-15 MED ORDER — IPRATROPIUM-ALBUTEROL 0.5-2.5 (3) MG/3ML IN SOLN
3.0000 mL | Freq: Four times a day (QID) | RESPIRATORY_TRACT | Status: AC
  Administered 2017-02-15 – 2017-02-16 (×5): 3 mL via RESPIRATORY_TRACT
  Filled 2017-02-15 (×6): qty 3

## 2017-02-15 MED ORDER — PIPERACILLIN-TAZOBACTAM 3.375 G IVPB 30 MIN
3.3750 g | Freq: Once | INTRAVENOUS | Status: AC
  Administered 2017-02-15: 3.375 g via INTRAVENOUS
  Filled 2017-02-15: qty 50

## 2017-02-15 MED ORDER — BISOPROLOL FUMARATE 5 MG PO TABS
2.5000 mg | ORAL_TABLET | Freq: Two times a day (BID) | ORAL | Status: DC
  Administered 2017-02-15 – 2017-02-22 (×13): 2.5 mg via ORAL
  Filled 2017-02-15 (×18): qty 0.5

## 2017-02-15 MED ORDER — KCL-LACTATED RINGERS-D5W 20 MEQ/L IV SOLN
INTRAVENOUS | Status: DC
  Filled 2017-02-15: qty 1000

## 2017-02-15 MED ORDER — PANTOPRAZOLE SODIUM 40 MG PO TBEC
40.0000 mg | DELAYED_RELEASE_TABLET | Freq: Every day | ORAL | Status: DC
  Administered 2017-02-16 – 2017-02-21 (×6): 40 mg via ORAL
  Filled 2017-02-15 (×6): qty 1

## 2017-02-15 MED ORDER — FLORANEX PO PACK
1.0000 g | PACK | Freq: Three times a day (TID) | ORAL | Status: DC
  Filled 2017-02-15 (×3): qty 1

## 2017-02-15 MED ORDER — ONDANSETRON 4 MG PO TBDP
8.0000 mg | ORAL_TABLET | Freq: Three times a day (TID) | ORAL | Status: DC | PRN
  Filled 2017-02-15: qty 2

## 2017-02-15 MED ORDER — WARFARIN SODIUM 2.5 MG PO TABS
2.5000 mg | ORAL_TABLET | Freq: Every day | ORAL | Status: DC
  Administered 2017-02-16: 2.5 mg via ORAL
  Filled 2017-02-15 (×2): qty 1

## 2017-02-15 MED ORDER — VANCOMYCIN HCL IN DEXTROSE 1-5 GM/200ML-% IV SOLN
1000.0000 mg | INTRAVENOUS | Status: DC
  Filled 2017-02-15: qty 200

## 2017-02-15 MED ORDER — DIAZEPAM 5 MG PO TABS
5.0000 mg | ORAL_TABLET | Freq: Two times a day (BID) | ORAL | Status: DC
  Administered 2017-02-15 – 2017-02-16 (×2): 5 mg via ORAL
  Filled 2017-02-15 (×2): qty 1

## 2017-02-15 MED ORDER — MIRTAZAPINE 15 MG PO TABS
15.0000 mg | ORAL_TABLET | Freq: Every day | ORAL | Status: DC
  Administered 2017-02-15 – 2017-02-20 (×5): 15 mg via ORAL
  Filled 2017-02-15 (×5): qty 1

## 2017-02-15 MED ORDER — SIMVASTATIN 20 MG PO TABS
80.0000 mg | ORAL_TABLET | Freq: Every day | ORAL | Status: DC
  Administered 2017-02-17 – 2017-02-22 (×6): 80 mg via ORAL
  Filled 2017-02-15 (×6): qty 4

## 2017-02-15 MED ORDER — POTASSIUM CHLORIDE 20 MEQ PO PACK
40.0000 meq | PACK | Freq: Once | ORAL | Status: DC
  Filled 2017-02-15: qty 2

## 2017-02-15 MED ORDER — DEXTROSE 5 % IV SOLN
2.0000 g | Freq: Two times a day (BID) | INTRAVENOUS | Status: DC
  Filled 2017-02-15: qty 2

## 2017-02-15 MED ORDER — LACOSAMIDE 50 MG PO TABS
200.0000 mg | ORAL_TABLET | Freq: Two times a day (BID) | ORAL | Status: DC
  Administered 2017-02-15 – 2017-02-22 (×14): 200 mg via ORAL
  Filled 2017-02-15 (×14): qty 4

## 2017-02-15 MED ORDER — VANCOMYCIN HCL IN DEXTROSE 1-5 GM/200ML-% IV SOLN
1000.0000 mg | Freq: Once | INTRAVENOUS | Status: AC
  Administered 2017-02-15: 1000 mg via INTRAVENOUS
  Filled 2017-02-15: qty 200

## 2017-02-15 MED ORDER — EZETIMIBE 10 MG PO TABS
10.0000 mg | ORAL_TABLET | Freq: Every day | ORAL | Status: DC
  Administered 2017-02-17 – 2017-02-22 (×6): 10 mg via ORAL
  Filled 2017-02-15 (×8): qty 1

## 2017-02-15 MED ORDER — DEXTROSE-NACL 5-0.45 % IV SOLN
INTRAVENOUS | Status: DC
  Administered 2017-02-15: 75 mL/h via INTRAVENOUS
  Administered 2017-02-15 – 2017-02-17 (×3): via INTRAVENOUS
  Administered 2017-02-17: 75 mL/h via INTRAVENOUS

## 2017-02-15 MED ORDER — PREGABALIN 75 MG PO CAPS
75.0000 mg | ORAL_CAPSULE | Freq: Two times a day (BID) | ORAL | Status: DC
  Administered 2017-02-15 – 2017-02-22 (×14): 75 mg via ORAL
  Filled 2017-02-15 (×14): qty 1

## 2017-02-15 MED ORDER — ACETAMINOPHEN 650 MG RE SUPP
650.0000 mg | Freq: Once | RECTAL | Status: AC
  Administered 2017-02-15: 650 mg via RECTAL
  Filled 2017-02-15: qty 1

## 2017-02-15 MED ORDER — ENOXAPARIN SODIUM 40 MG/0.4ML ~~LOC~~ SOLN
40.0000 mg | SUBCUTANEOUS | Status: DC
  Administered 2017-02-17 – 2017-02-22 (×6): 40 mg via SUBCUTANEOUS
  Filled 2017-02-15 (×6): qty 0.4

## 2017-02-15 NOTE — Progress Notes (Signed)
CODE SEPSIS - PHARMACY COMMUNICATION  **Broad Spectrum Antibiotics should be administered within 1 hour of Sepsis diagnosis**  Time Code Sepsis Called/Page Received: 2/8 1158   Antibiotics Ordered: Zosyn  Time of 1st antibiotic administration: Zosyn @ 1201  Additional action taken by pharmacy: N/A  If necessary, Name of Provider/Nurse Contacted: N/A    Yolanda BonineHannah Shavonne Ambroise, PharmD Pharmacy Resident 02/15/2017  12:22 PM

## 2017-02-15 NOTE — ED Notes (Addendum)
Attempted to call report

## 2017-02-15 NOTE — ED Notes (Signed)
CODE  SEPSIS  CALLED  TO  CARELINK 

## 2017-02-15 NOTE — ED Notes (Signed)
Called report to Sherol DadeStephanie Kennedy RN

## 2017-02-15 NOTE — Plan of Care (Signed)
  Education: Knowledge of General Education information will improve 02/15/2017 1932 - Progressing by Donnel SaxonKennedy, Britta Louth L, RN   Health Behavior/Discharge Planning: Ability to manage health-related needs will improve 02/15/2017 1932 - Progressing by Donnel SaxonKennedy, Theon Sobotka L, RN   Clinical Measurements: Ability to maintain clinical measurements within normal limits will improve 02/15/2017 1932 - Progressing by Donnel SaxonKennedy, Tywaun Hiltner L, RN Will remain free from infection 02/15/2017 1932 - Progressing by Donnel SaxonKennedy, Roman Dubuc L, RN Diagnostic test results will improve 02/15/2017 1932 - Progressing by Donnel SaxonKennedy, Addelynn Batte L, RN Respiratory complications will improve 02/15/2017 1932 - Progressing by Donnel SaxonKennedy, Reynald Woods L, RN Cardiovascular complication will be avoided 02/15/2017 1932 - Progressing by Donnel SaxonKennedy, Maryln Eastham L, RN   Activity: Risk for activity intolerance will decrease 02/15/2017 1932 - Progressing by Donnel SaxonKennedy, Tanashia Ciesla L, RN   Nutrition: Adequate nutrition will be maintained 02/15/2017 1932 - Progressing by Donnel SaxonKennedy, Charle Mclaurin L, RN   Coping: Level of anxiety will decrease 02/15/2017 1932 - Progressing by Donnel SaxonKennedy, Shawntavia Saunders L, RN   Elimination: Will not experience complications related to bowel motility 02/15/2017 1932 - Progressing by Donnel SaxonKennedy, Raheen Capili L, RN Will not experience complications related to urinary retention 02/15/2017 1932 - Progressing by Donnel SaxonKennedy, Bralee Feldt L, RN   Pain Managment: General experience of comfort will improve 02/15/2017 1932 - Progressing by Donnel SaxonKennedy, Harrison Zetina L, RN   Safety: Ability to remain free from injury will improve 02/15/2017 1932 - Progressing by Donnel SaxonKennedy, Rakin Lemelle L, RN   Skin Integrity: Risk for impaired skin integrity will decrease 02/15/2017 1932 - Progressing by Donnel SaxonKennedy, Laylia Mui L, RN

## 2017-02-15 NOTE — ED Triage Notes (Signed)
Sinus Tach, no fever, bp normal, recent Dx viral pneumonia, noro virus, green discharge from urinary cath

## 2017-02-15 NOTE — H&P (Addendum)
[facility] - [place] at [facility]   PATIENT NAME: Zachary Peterson    MR#:  [id number]  DATE OF BIRTH:  [birth year]  DATE OF ADMISSION:  02/15/2017  PRIMARY CARE PHYSICIAN: Ignacia Palma., MD   REQUESTING/REFERRING PHYSICIAN:   CHIEF COMPLAINT:   Chief Complaint  Patient presents with  . Shortness of Breath    runny nose, Recent Dx viral Pneumonia, norovirus    HISTORY OF PRESENT ILLNESS: Zachary Peterson  is a 84 y.o. male with a known history per below recently discharged from [facility] for pneumonia January 21, currently in inpatient rehab, since emergency room for shortness of breath, tachycardia, noted hypoxia with O2 saturation in the 80s, diarrhea for the last 1-2 days, in the emergency room patient was tachycardic, tachypneic, hypoxic, patient evaluated in the emergency room, multiple family members at the bedside, chest x-ray noted for multifocal pneumonia, patient is now being admitted for acute multifocal pneumonia-cannot rule out possible aspiration HCAP/given history of dysphagia.  PAST MEDICAL HISTORY:   Past Medical History:  Diagnosis Date  . Back pain   . COPD (chronic obstructive pulmonary disease) (HCC)   . Depression   . Fatigue    a. in setting of Afib, improved following DCCV.  [name] Hearing loss   . History of GI bleed 1976  . Hyperlipemia   . Hypertension   . Migraine   . Palpitations   . Persistent atrial fibrillation (HCC)    a. 02/2016 Echo: EF 66-65%, mild MR;  b. CHA2DS2VASc = 3-->coumadin;  c. 05/2016 TEE/DCCV: EF 45-50%, triv AI, mild to mod MR, no LA/LAA/RA/RAA thrombus, mod to sev TR-->successful DCCV.  . Seizure (HCC)   . Syncope    a. related to seizure disorder    PAST SURGICAL HISTORY:  Past Surgical History:  Procedure Laterality Date  . APPENDECTOMY    . BACK SURGERY    . CARDIOVERSION N/A 06/06/2016   Procedure: CARDIOVERSION;  Surgeon: Yvonne Kendall, MD;  Location: [facility] ORS;  Service: Cardiovascular;  Laterality: N/A;   . CATARACT EXTRACTION    . hemmoridectomy    . HERNIA REPAIR    . TEE WITHOUT CARDIOVERSION N/A 06/06/2016   Procedure: TRANSESOPHAGEAL ECHOCARDIOGRAM (TEE);  Surgeon: Yvonne Kendall, MD;  Location: [facility] ORS;  Service: Cardiovascular;  Laterality: N/A;    SOCIAL HISTORY:  Social History   Tobacco Use  . Smoking status: Former Games developer  . Smokeless tobacco: Former Engineer, water Use Topics  . Alcohol use: No    FAMILY HISTORY:  Family History  Problem Relation Age of Onset  . Heart disease Mother   . Hypertension Father     DRUG ALLERGIES:  Allergies  Allergen Reactions  . Aspirin Other (See Comments)    CAUSES ULCERS  . Codeine Nausea And Vomiting  . Oxycodone Nausea And Vomiting    REVIEW OF SYSTEMS: Poor historian due to mild to moderate respiratory distress-only able to speak in short sentences  CONSTITUTIONAL: + fever, fatigue/ weakness.  EYES: No blurred or double vision.  EARS, NOSE, AND THROAT: No tinnitus or ear pain.  RESPIRATORY: + cough, shortness of breath, wheezing, no or hemoptysis.  CARDIOVASCULAR: No chest pain, orthopnea, edema.  GASTROINTESTINAL: No nausea, vomiting, diarrhea or abdominal pain.  GENITOURINARY: No dysuria, hematuria.  ENDOCRINE: No polyuria, nocturia,  HEMATOLOGY: No anemia, easy bruising or bleeding SKIN: No rash or lesion. MUSCULOSKELETAL: No joint pain or arthritis.   NEUROLOGIC: No tingling, numbness, weakness.  PSYCHIATRY: No anxiety or depression.  MEDICATIONS AT HOME:  Prior to Admission medications   Medication Sig Start Date End Date Taking? Authorizing Provider  bisoprolol (ZEBETA) 5 MG tablet Take 0.5 tablets (2.5 mg total) by mouth 2 (two) times daily. 05/11/16  Yes End, Cristal Deer, MD  diazepam (VALIUM) 2 MG tablet Take 1 tablet (2 mg total) by mouth every 12 (twelve) hours. 02/09/17  Yes Penny Pia, MD  ezetimibe-simvastatin (VYTORIN) 10-80 MG tablet Take 1 tablet by mouth daily.   Yes [provider]   lacosamide (VIMPAT) 200 MG TABS tablet Take 1 tablet (200 mg total) by mouth 2 (two) times daily. 02/09/17  Yes Penny Pia, MD  mirtazapine (REMERON) 15 MG tablet Take 15 mg by mouth at bedtime.   Yes [provider]  pantoprazole (PROTONIX) 20 MG tablet Take 20 mg by mouth daily.   Yes [provider]  pregabalin (LYRICA) 75 MG capsule Take 75 mg by mouth 2 (two) times daily.   Yes [provider]  tamsulosin (FLOMAX) 0.4 MG CAPS capsule Take 2 capsules (0.8 mg total) by mouth daily after breakfast. 02/10/17  Yes Penny Pia, MD  tiotropium (SPIRIVA) 18 MCG inhalation capsule Place 18 mcg into inhaler and inhale daily.   Yes [provider]  warfarin (COUMADIN) 2.5 MG tablet TAKE 1 OR 2 TABLETS BY MOUTH EVERY DAY OR AS PHYSICIAN INSTRUCTED BY COUMADIN CLINIC Patient taking differently: TAKE 1 TABLET BY MOUTH DAILY 09/25/16  Yes End, Cristal Deer, MD  calcium carbonate (TUMS - DOSED IN MG ELEMENTAL CALCIUM) 500 MG chewable tablet Chew 1 tablet (200 mg of elemental calcium total) by mouth 3 (three) times daily with meals as needed for indigestion or heartburn. 02/24/16   Gouru, Deanna Artis, MD  fluticasone (FLONASE) 50 MCG/ACT nasal spray Place 2 sprays into both nostrils daily as needed for allergies.     [provider]  hydroxypropyl methylcellulose / hypromellose (ISOPTO TEARS / GONIOVISC) 2.5 % ophthalmic solution Place 1 drop into both eyes 3 (three) times daily as needed for dry eyes.    [provider]  ondansetron (ZOFRAN ODT) 8 MG disintegrating tablet Take 1 tablet (8 mg total) by mouth every 8 (eight) hours as needed for nausea or vomiting. 04/13/14   Kirichenko, Lemont Fillers, PA-C      PHYSICAL EXAMINATION:   VITAL SIGNS: Blood pressure (!) 141/81, pulse (!) 117, temperature 98.8 F (37.1 C), temperature source Oral, resp. rate (!) 22, height 5\' 7"  (1.702 m), weight 61.2 kg (135 lb), SpO2 (!) 87 %.  GENERAL:  84 y.o.-year-old patient lying in  the bed with no acute distress.  Frail-appearing EYES: Pupils equal, round, reactive to light and accommodation. No scleral icterus. Extraocular muscles intact.  HEENT: Head atraumatic, normocephalic. Oropharynx and nasopharynx clear.  NECK:  Supple, no jugular venous distention. No thyroid enlargement, no tenderness.  LUNGS: Diminished breath sounds with coarse/mild rhonchi bilaterally. No use of accessory muscles of respiration.  CARDIOVASCULAR: S1, S2 normal. No murmurs, rubs, or gallops.  ABDOMEN: Soft, nontender, nondistended. Bowel sounds present. No organomegaly or mass.  EXTREMITIES: Bilateral lower extremity edema, cyanosis, or clubbing.  Profound muscular wasting NEUROLOGIC: Cranial nerves II through XII are intact. MAES. Gait not checked.  PSYCHIATRIC: The patient is alert and oriented x 3.  SKIN: No obvious rash, lesion, or ulcer.   LABORATORY PANEL:   CBC Recent Labs  Lab 02/09/17 0236 02/15/17 1110  WBC 8.9 9.2  HGB 10.7* 12.4*  HCT 32.0* 36.2*  PLT 688* 588*  MCV 93.3 94.2  MCH 31.2 32.2  MCHC 33.4 34.2  RDW 15.8* 15.5*  LYMPHSABS  --  1.1  MONOABS  --  0.7  EOSABS  --  0.0  BASOSABS  --  0.1   ------------------------------------------------------------------------------------------------------------------  Chemistries  Recent Labs  Lab 02/15/17 1110  NA 136  K 4.4  CL 101  CO2 25  GLUCOSE 140*  BUN 23*  CREATININE 1.45*  CALCIUM 8.8*  AST 30  ALT 16*  ALKPHOS 100  BILITOT 0.5   ------------------------------------------------------------------------------------------------------------------ estimated creatinine clearance is 33.4 mL/min (A) (by C-G formula based on SCr of 1.45 mg/dL (H)). ------------------------------------------------------------------------------------------------------------------ No results for input(s): TSH, T4TOTAL, T3FREE, THYROIDAB in the last 72 hours.  Invalid input(s): FREET3   Coagulation profile Recent Labs   Lab 02/09/17 0236  INR 1.37   ------------------------------------------------------------------------------------------------------------------- No results for input(s): DDIMER in the last 72 hours. -------------------------------------------------------------------------------------------------------------------  Cardiac Enzymes Recent Labs  Lab 02/15/17 1110  TROPONINI <0.03   ------------------------------------------------------------------------------------------------------------------ Invalid input(s): POCBNP  ---------------------------------------------------------------------------------------------------------------  Urinalysis    Component Value Date/Time   COLORURINE YELLOW (A) 02/15/2017 1138   APPEARANCEUR HAZY (A) 02/15/2017 1138   LABSPEC 1.018 02/15/2017 1138   PHURINE 5.0 02/15/2017 1138   GLUCOSEU NEGATIVE 02/15/2017 1138   HGBUR MODERATE (A) 02/15/2017 1138   BILIRUBINUR NEGATIVE 02/15/2017 1138   KETONESUR NEGATIVE 02/15/2017 1138   PROTEINUR 30 (A) 02/15/2017 1138   UROBILINOGEN 0.2 05/17/2009 1257   NITRITE NEGATIVE 02/15/2017 1138   LEUKOCYTESUR SMALL (A) 02/15/2017 1138     RADIOLOGY: Dg Chest Portable 1 View  Result Date: 02/15/2017 CLINICAL DATA:  Shortness of breath, tachycardia, pneumonia EXAM: PORTABLE CHEST 1 VIEW COMPARISON:  02/02/2017 FINDINGS: Multifocal patchy opacities the bilateral upper and lower lobes, similar to the prior, although with mild progression in the left upper lobe. No pleural effusion or pneumothorax. The heart is normal in size IMPRESSION: Multifocal pneumonia, with mild progression in the left upper lobe. Electronically Signed   By: Charline BillsSriyesh  Krishnan M.D.   On: 02/15/2017 11:26    EKG: Orders placed or performed during the hospital encounter of 02/15/17  . ED EKG 12-Lead  . ED EKG 12-Lead    IMPRESSION AND PLAN: 1 acute multifocal HCAP Cannot rule out aspiration given history of dysphagia Admit to regular  nursing floor bed on our pneumonia protocol, vancomycin/cefepime, follow-up on cultures, aspiration/fall precautions, speech therapy to evaluate/treat  2 acute on COPD exacerbation, mild Aggressive pulmonary toilet with bronchodilator therapy, inhaled corticosteroids twice daily, respiratory therapy to see, mucolytic agents, supplemental oxygen as needed, and continue close medical monitoring  3 acute on chronic hypoxic respiratory failure Secondary to above On 2-3 L continuous at rehab facility All other plans as stated above  4 chronic paroxysmal A. Fib Continue beta-blocker therapy, Coumadin-check PT/INR now as well as every a.m. Currently in sinus rhythm  5 chronic moderate to severe protein calorie malnutrition Dietary was consulted Check prealbumin level  6 acute diarrhea Etiology unknown Check GI panel, Lactinex 3 times daily  7 chronic BPH with chronic urinary retention Continue to straight cath every 6 hours   Patient wishes to be intubated if needed only-no other resuscitative efforts Long-term prognosis poor DVT prophylaxis with Lovenox subcu    All the records are reviewed and case discussed with ED provider. Management plans discussed with the patient, family and they are in agreement.  CODE STATUS: Code Status History    Date Active Date Inactive Code Status Order ID Comments User Context   01/27/2017 00:05 02/09/2017 16:41 Full  Code [id number]  Tobey Grim, NP ED   02/20/2016 21:15 02/24/2016 19:38 Full Code [id number]  Auburn Bilberry, MD Inpatient       TOTAL TIME TAKING CARE OF THIS PATIENT: 45 minutes.    Evelena Asa Salary M.D on 02/15/2017   Between 7am to 6pm - Pager - [phone]  After 6pm go to [email or web address] - password [email or web address]  [facility]  Office  [phone]  CC: Primary care physician; Ignacia Palma., MD   Note: This dictation was prepared with Dragon dictation along with smaller phrase technology. Any  transcriptional errors that result from this process are unintentional.

## 2017-02-15 NOTE — ED Provider Notes (Signed)
[facility] Emergency Department Provider Note   ____________________________________________   First MD Initiated Contact with Patient 02/15/17 1109     (approximate)  I have reviewed the triage vital signs and the nursing notes.   HISTORY  Chief Complaint Shortness of Breath (runny nose, Recent Dx viral Pneumonia, norovirus) Chief complaint shortness of breath  HPI Zachary Peterson is a 84 y.o. male patient comes from [facility] where he has been short of breath. He is satting in the 80s on room air. he does not use oxygen. On 4 L oxygen now his sats go up into the 90s. He is tachypnea. He says he is short of breath. He is not running a fever. Last month he had a seizure and it was felt that he may have aspirated.  he also was diagnosed with viral pneumonia and had pus come out when his Foley was removed several days ago. Past Medical History:  Diagnosis Date  . Back pain   . COPD (chronic obstructive pulmonary disease) (HCC)   . Depression   . Fatigue    a. in setting of Afib, improved following DCCV.  [name]. Hearing loss   . History of GI bleed 1976  . Hyperlipemia   . Hypertension   . Migraine   . Palpitations   . Persistent atrial fibrillation (HCC)    a. 02/2016 Echo: EF 66-65%, mild MR;  b. CHA2DS2VASc = 3-->coumadin;  c. 05/2016 TEE/DCCV: EF 45-50%, triv AI, mild to mod MR, no LA/LAA/RA/RAA thrombus, mod to sev TR-->successful DCCV.  . Seizure (HCC)   . Syncope    a. related to seizure disorder    Patient Active Problem List   Diagnosis Date Noted  . Acute respiratory failure with hypoxia (HCC)   . Dysphagia   . Chronic obstructive pulmonary disease (HCC)   . PAF (paroxysmal atrial fibrillation) (HCC)   . Benign essential HTN   . Hypophosphatemia   . Leukocytosis   . Acute blood loss anemia   . Aspiration pneumonia (HCC) 01/27/2017  . Endotracheally intubated 01/27/2017  . Acute hypercapnic respiratory failure (HCC)   . At high risk  for aspiration   . (HFpEF) heart failure with preserved ejection fraction (HCC) 08/29/2016  . Persistent atrial fibrillation (HCC) 06/06/2016  . Encounter for therapeutic drug monitoring 03/14/2016  . Difficulty in walking, not elsewhere classified   . Muscle weakness (generalized)   . Diarrhea   . Seizure (HCC) 02/21/2016  . Atrial fibrillation with RVR (HCC) 02/21/2016  . Viral gastroenteritis 02/21/2016  . Hypokalemia 02/21/2016  . Demand ischemia of myocardium (HCC) 02/21/2016  . Essential hypertension 02/21/2016    Past Surgical History:  Procedure Laterality Date  . APPENDECTOMY    . BACK SURGERY    . CARDIOVERSION N/A 06/06/2016   Procedure: CARDIOVERSION;  Surgeon: Yvonne KendallEnd, Christopher, MD;  Location: [facility] ORS;  Service: Cardiovascular;  Laterality: N/A;  . CATARACT EXTRACTION    . hemmoridectomy    . HERNIA REPAIR    . TEE WITHOUT CARDIOVERSION N/A 06/06/2016   Procedure: TRANSESOPHAGEAL ECHOCARDIOGRAM (TEE);  Surgeon: Yvonne KendallEnd, Christopher, MD;  Location: [facility] ORS;  Service: Cardiovascular;  Laterality: N/A;    Prior to Admission medications   Medication Sig Start Date End Date Taking? Authorizing Provider  bisoprolol (ZEBETA) 5 MG tablet Take 0.5 tablets (2.5 mg total) by mouth 2 (two) times daily. 05/11/16   End, [name], MD  calcium carbonate (TUMS - DOSED IN MG ELEMENTAL CALCIUM) 500 MG chewable tablet Chew  1 tablet (200 mg of elemental calcium total) by mouth 3 (three) times daily with meals as needed for indigestion or heartburn. 02/24/16   Ramonita Lab, MD  diazepam (VALIUM) 2 MG tablet Take 1 tablet (2 mg total) by mouth every 12 (twelve) hours. 02/09/17   Penny Pia, MD  ezetimibe-simvastatin (VYTORIN) 10-80 MG tablet Take 1 tablet by mouth daily.    [provider]  fluticasone (FLONASE) 50 MCG/ACT nasal spray Place 2 sprays into both nostrils daily as needed for allergies.     [provider]  hydroxypropyl methylcellulose / hypromellose (ISOPTO TEARS  / GONIOVISC) 2.5 % ophthalmic solution Place 1 drop into both eyes 3 (three) times daily as needed for dry eyes.    [provider]  lacosamide (VIMPAT) 200 MG TABS tablet Take 1 tablet (200 mg total) by mouth 2 (two) times daily. 02/09/17   Penny Pia, MD  mirtazapine (REMERON) 15 MG tablet Take 15 mg by mouth at bedtime.    [provider]  ondansetron (ZOFRAN ODT) 8 MG disintegrating tablet Take 1 tablet (8 mg total) by mouth every 8 (eight) hours as needed for nausea or vomiting. 04/13/14   Kirichenko, Tatyana, PA-C  pantoprazole (PROTONIX) 20 MG tablet Take 20 mg by mouth daily.    [provider]  pregabalin (LYRICA) 75 MG capsule Take 75 mg by mouth 2 (two) times daily.    [provider]  tamsulosin (FLOMAX) 0.4 MG CAPS capsule Take 2 capsules (0.8 mg total) by mouth daily after breakfast. 02/10/17   Penny Pia, MD  tiotropium (SPIRIVA) 18 MCG inhalation capsule Place 18 mcg into inhaler and inhale daily.    [provider]  warfarin (COUMADIN) 2.5 MG tablet TAKE 1 OR 2 TABLETS BY MOUTH EVERY DAY OR AS PHYSICIAN INSTRUCTED BY COUMADIN CLINIC 09/25/16   End, Cristal Deer, MD    Allergies Aspirin; Codeine; and Oxycodone  Family History  Problem Relation Age of Onset  . Heart disease Mother   . Hypertension Father     Social History Social History   Tobacco Use  . Smoking status: Former Games developer  . Smokeless tobacco: Former Engineer, water Use Topics  . Alcohol use: No  . Drug use: No    Review of Systems  Constitutional: No fever/chills Eyes: No visual changes. ENT: No sore throat. Cardiovascular: Denies chest pain. Respiratory: shortness of breath. Gastrointestinal: No abdominal pain.  No nausea, no vomiting.  No diarrhea.  No constipation. Genitourinary: Negative for dysuria. Musculoskeletal: Negative for back pain. Skin: Negative for rash. Neurological: Negative for headaches, focal weakness    ____________________________________________   PHYSICAL EXAM:  VITAL SIGNS: ED Triage Vitals  Enc Vitals Group     BP      Pulse      Resp      Temp      Temp src      SpO2      Weight      Height      Head Circumference      Peak Flow      Pain Score      Pain Loc      Pain Edu?      Excl. in GC?     Constitutional: Alert and oriented. Ill appearing in breathing hard Eyes: Conjunctivae are normal.  Head: Atraumatic. Nose: No congestion/rhinnorhea. Mouth/Throat: Mucous membranes are moist.  Oropharynx non-erythematous. Neck: No stridor.  Cardiovascular: Normal rate, regular rhythm. Grossly normal heart sounds.  Good peripheral circulation.  Respiratory: increased respiratory effort.   retractions. Lungs scattered crackles Gastrointestinal: Soft and nontender. No distention. No abdominal bruits. No CVA tenderness. Musculoskeletal: No lower extremity tenderness nor edema.  No joint effusions. Neurologic:  Normal speech and language. No gross focal neurologic deficits are appreciated. No gait instability. Skin:  Skin is warm, dry and intact. No rash noted.  ____________________________________________   LABS (all labs ordered are listed, but only abnormal results are displayed)  Labs Reviewed  COMPREHENSIVE METABOLIC PANEL - Abnormal; Notable for the following components:      Result Value   Glucose, Bld 140 (*)    BUN 23 (*)    Creatinine, Ser 1.45 (*)    Calcium 8.8 (*)    Total Protein 8.2 (*)    Albumin 3.3 (*)    ALT 16 (*)    GFR calc non Af Amer 43 (*)    GFR calc Af Amer 50 (*)    All other components within normal limits  CBC WITH DIFFERENTIAL/PLATELET - Abnormal; Notable for the following components:   RBC 3.85 (*)    Hemoglobin 12.4 (*)    HCT 36.2 (*)    RDW 15.5 (*)    Platelets 588 (*)    Neutro Abs 7.3 (*)    All other components within normal limits  CULTURE, BLOOD (ROUTINE X 2)  CULTURE, BLOOD (ROUTINE X 2)  TROPONIN I  LACTIC ACID,  PLASMA  BLOOD GAS, VENOUS  LACTIC ACID, PLASMA  BRAIN NATRIURETIC PEPTIDE  URINALYSIS, COMPLETE (UACMP) WITH MICROSCOPIC  URINE DRUG SCREEN, QUALITATIVE ([facility] ONLY)  URINALYSIS, ROUTINE W REFLEX MICROSCOPIC  I-STAT CG4 LACTIC ACID, ED  I-STAT CG4 LACTIC ACID, ED   ____________________________________________  EKG ____________________________________________  RADIOLOGY  ED MD interpretation:   Official radiology report(s): Dg Chest Portable 1 View  Result Date: 02/15/2017 CLINICAL DATA:  Shortness of breath, tachycardia, pneumonia EXAM: PORTABLE CHEST 1 VIEW COMPARISON:  02/02/2017 FINDINGS: Multifocal patchy opacities the bilateral upper and lower lobes, similar to the prior, although with mild progression in the left upper lobe. No pleural effusion or pneumothorax. The heart is normal in size IMPRESSION: Multifocal pneumonia, with mild progression in the left upper lobe. Electronically Signed   By: Charline Bills M.D.   On: 02/15/2017 11:26    ____________________________________________   PROCEDURES  Procedure(s) performed:   Procedures  Critical Care performed:   ____________________________________________   INITIAL IMPRESSION / ASSESSMENT AND PLAN / ED COURSE    patient now with multifocal pneumonia. White count was not particularly elevated but he is hypoxic tachypnea can tachycardic. I have started the sepsis protocol started him on Zosyn we'll get him in the hospital.        ____________________________________________   FINAL CLINICAL IMPRESSION(S) / ED DIAGNOSES  Final diagnoses:  HCAP (healthcare-associated pneumonia)     ED Discharge Orders    None       Note:  This document was prepared using Dragon voice recognition software and may include unintentional dictation errors.    Arnaldo Natal, MD 02/15/17 [phone]

## 2017-02-15 NOTE — Progress Notes (Signed)
Pharmacy Antibiotic Note  Zachary RavelSamuel D Peterson is a 84 y.o. male admitted on 02/15/2017 with multifocal PNA/HCAP/possible aspiration pneumonia.  Pharmacy has been consulted for vancomycin and cefepime dosing.  Patient received vancomycin 1000mg  once and zosyn 3.375g once in ED.   Plan: Will begin  Vancomycin 1000 IV every 24 hours.  Goal trough 15-20 mcg/mL.  Cefepime 2g Q12H due to CrCl of 33.4.   Pharmayc will continue to monitor and adjust as needed.   Ke=0.03 T1/2=21 hr Vd=46 Cmin 17 Stack dose @ 12 hours  Height: 5\' 7"  (170.2 cm) Weight: 135 lb (61.2 kg) IBW/kg (Calculated) : 66.1  Temp (24hrs), Avg:100.8 F (38.2 C), Min:98.8 F (37.1 C), Max:102.7 F (39.3 C)  Recent Labs  Lab 02/09/17 0236 02/15/17 1110  WBC 8.9 9.2  CREATININE  --  1.45*  LATICACIDVEN  --  1.9    Estimated Creatinine Clearance: 33.4 mL/min (A) (by C-G formula based on SCr of 1.45 mg/dL (H)).    Allergies  Allergen Reactions  . Ativan [Lorazepam] Anaphylaxis  . Aspirin Other (See Comments)    CAUSES ULCERS  . Codeine Nausea And Vomiting  . Oxycodone Nausea And Vomiting    Antimicrobials this admission: Zosyn x1 Vancomycin 2/8 >> Cefepime 2/9>>  Dose adjustments this admission:   Microbiology results: BCx: 2/8 >> NGTD Sputum: 2/8 >> sent   Thank you for allowing pharmacy to be a part of this patient's care.  Yolanda BonineHannah Lifsey, PharmD Pharmacy Resident 02/15/2017 4:15 PM

## 2017-02-16 ENCOUNTER — Inpatient Hospital Stay: Payer: Medicare HMO

## 2017-02-16 DIAGNOSIS — A419 Sepsis, unspecified organism: Principal | ICD-10-CM

## 2017-02-16 DIAGNOSIS — J449 Chronic obstructive pulmonary disease, unspecified: Secondary | ICD-10-CM

## 2017-02-16 LAB — CBC WITH DIFFERENTIAL/PLATELET
Basophils Absolute: 0.1 10*3/uL (ref 0–0.1)
Basophils Absolute: 0 10*3/uL (ref 0–0.1)
Basophils Relative: 1 %
Basophils Relative: 1 %
Eosinophils Absolute: 0 10*3/uL (ref 0–0.7)
Eosinophils Absolute: 0 10*3/uL (ref 0–0.7)
Eosinophils Relative: 0 %
Eosinophils Relative: 0 %
HEMATOCRIT: 30.7 % — AB (ref 40.0–52.0)
HEMATOCRIT: 29.7 % — AB (ref 40.0–52.0)
Hemoglobin: 10.2 g/dL — ABNORMAL LOW (ref 13.0–18.0)
Hemoglobin: 9.9 g/dL — ABNORMAL LOW (ref 13.0–18.0)
LYMPHS ABS: 0.5 10*3/uL — AB (ref 1.0–3.6)
LYMPHS ABS: 0.6 10*3/uL — AB (ref 1.0–3.6)
LYMPHS PCT: 11 %
LYMPHS PCT: 21 %
MCH: 31.5 pg (ref 26.0–34.0)
MCH: 31.9 pg (ref 26.0–34.0)
MCHC: 33.3 g/dL (ref 32.0–36.0)
MCHC: 33.4 g/dL (ref 32.0–36.0)
MCV: 94.6 fL (ref 80.0–100.0)
MCV: 95.5 fL (ref 80.0–100.0)
MONO ABS: 0.2 10*3/uL (ref 0.2–1.0)
MONOS PCT: 8 %
Monocytes Absolute: 0.4 10*3/uL (ref 0.2–1.0)
Monocytes Relative: 5 %
NEUTROS ABS: 3.8 10*3/uL (ref 1.4–6.5)
NEUTROS ABS: 2.1 10*3/uL (ref 1.4–6.5)
Neutrophils Relative %: 80 %
Neutrophils Relative %: 73 %
Platelets: 430 10*3/uL (ref 150–440)
Platelets: 329 10*3/uL (ref 150–440)
RBC: 3.24 MIL/uL — ABNORMAL LOW (ref 4.40–5.90)
RBC: 3.11 MIL/uL — ABNORMAL LOW (ref 4.40–5.90)
RDW: 15.5 % — AB (ref 11.5–14.5)
RDW: 15.6 % — AB (ref 11.5–14.5)
WBC: 4.8 10*3/uL (ref 3.8–10.6)
WBC: 3 10*3/uL — ABNORMAL LOW (ref 3.8–10.6)

## 2017-02-16 LAB — PREALBUMIN: Prealbumin: 10.9 mg/dL — ABNORMAL LOW (ref 18–38)

## 2017-02-16 LAB — BASIC METABOLIC PANEL
ANION GAP: 7 (ref 5–15)
BUN: 15 mg/dL (ref 6–20)
CO2: 23 mmol/L (ref 22–32)
Calcium: 7.3 mg/dL — ABNORMAL LOW (ref 8.9–10.3)
Chloride: 103 mmol/L (ref 101–111)
Creatinine, Ser: 1.23 mg/dL (ref 0.61–1.24)
GFR calc Af Amer: >60 mL/min (ref >60)
GFR calc non Af Amer: 52 mL/min — ABNORMAL LOW (ref >60)
GLUCOSE: 122 mg/dL — AB (ref 65–99)
POTASSIUM: 3.8 mmol/L (ref 3.5–5.1)
Sodium: 133 mmol/L — ABNORMAL LOW (ref 135–145)

## 2017-02-16 LAB — LACTIC ACID, PLASMA
Lactic Acid, Venous: 1.4 mmol/L (ref 0.5–1.9)
Lactic Acid, Venous: 1.1 mmol/L (ref 0.5–1.9)

## 2017-02-16 LAB — GLUCOSE, CAPILLARY: GLUCOSE-CAPILLARY: 121 mg/dL — AB (ref 65–99)

## 2017-02-16 LAB — TROPONIN I

## 2017-02-16 LAB — PROTIME-INR
INR: 1.16
Prothrombin Time: 14.7 seconds (ref 11.4–15.2)

## 2017-02-16 LAB — STREP PNEUMONIAE URINARY ANTIGEN: STREP PNEUMO URINARY ANTIGEN: NEGATIVE

## 2017-02-16 MED ORDER — METHYLPREDNISOLONE SODIUM SUCC 125 MG IJ SOLR
60.0000 mg | INTRAMUSCULAR | Status: DC
  Administered 2017-02-16 – 2017-02-18 (×3): 60 mg via INTRAVENOUS
  Filled 2017-02-16 (×3): qty 2

## 2017-02-16 MED ORDER — SODIUM CHLORIDE 0.9 % IV BOLUS (SEPSIS)
500.0000 mL | Freq: Once | INTRAVENOUS | Status: AC
  Administered 2017-02-16: 500 mL via INTRAVENOUS

## 2017-02-16 MED ORDER — WARFARIN SODIUM 4 MG PO TABS
4.0000 mg | ORAL_TABLET | Freq: Once | ORAL | Status: DC
  Filled 2017-02-16: qty 1

## 2017-02-16 MED ORDER — WARFARIN - PHARMACIST DOSING INPATIENT
Freq: Every day | Status: DC
  Administered 2017-02-19 – 2017-02-22 (×3)

## 2017-02-16 MED ORDER — PIPERACILLIN-TAZOBACTAM 3.375 G IVPB
3.3750 g | Freq: Three times a day (TID) | INTRAVENOUS | Status: DC
  Administered 2017-02-16 – 2017-02-19 (×10): 3.375 g via INTRAVENOUS
  Filled 2017-02-16 (×10): qty 50

## 2017-02-16 MED ORDER — ACETAMINOPHEN 325 MG PO TABS
ORAL_TABLET | ORAL | Status: AC
  Filled 2017-02-16: qty 2

## 2017-02-16 MED ORDER — ACETAMINOPHEN 325 MG PO TABS
650.0000 mg | ORAL_TABLET | Freq: Four times a day (QID) | ORAL | Status: DC | PRN
  Administered 2017-02-16 – 2017-02-19 (×2): 650 mg via ORAL
  Filled 2017-02-16: qty 2

## 2017-02-16 MED ORDER — DEXTROSE 5 % IV SOLN
0.0000 ug/min | Freq: Once | INTRAVENOUS | Status: DC
  Filled 2017-02-16: qty 1

## 2017-02-16 MED ORDER — VANCOMYCIN HCL IN DEXTROSE 1-5 GM/200ML-% IV SOLN
1000.0000 mg | INTRAVENOUS | Status: DC
  Administered 2017-02-17: 1000 mg via INTRAVENOUS
  Filled 2017-02-16: qty 200

## 2017-02-16 MED ORDER — SODIUM CHLORIDE 0.9 % IV SOLN
0.0000 ug/min | INTRAVENOUS | Status: DC
  Administered 2017-02-16: 20 ug/min via INTRAVENOUS
  Administered 2017-02-17: 2 ug/min via INTRAVENOUS
  Administered 2017-02-17: 6 ug/min via INTRAVENOUS
  Filled 2017-02-16 (×2): qty 10

## 2017-02-16 MED ORDER — RISAQUAD PO CAPS
1.0000 | ORAL_CAPSULE | Freq: Three times a day (TID) | ORAL | Status: DC
  Administered 2017-02-16 – 2017-02-22 (×20): 1 via ORAL
  Filled 2017-02-16 (×20): qty 1

## 2017-02-16 NOTE — Progress Notes (Signed)
PT Cancellation Note  Patient Details Name: Zachary RavelSamuel D Peterson MRN: [id number] DOB: 19-Mar-1931   Cancelled Treatment:    Reason Eval/Treat Not Completed: Other (comment).  PT consult received.  Chart reviewed.  Nursing reports pt not feeling well and with an elevated temperature this morning (per chart most recent documented temperature 102.2).  Upon arrival to pt's room pt soundly sleeping with respiratory present for breathing treatment.  Will re-attempt PT evaluation at a later date/time.  Hendricks LimesEmily Khadeem Rockett, PT 02/16/17, 11:42 AM [phone]

## 2017-02-16 NOTE — Progress Notes (Signed)
Ch was paged for Rapid Response. Wife was outside of the room and Ch provided support and prayer. Ch rolled wife to ICU waiting room and waited for other family. Ch checked on readiness of the Pt and called family pastor. Chaplain continued support and prayer until another RR. Chaplain followed up with family who said they were ok. Renato Gailsastor called for direction to the room.   02/16/17 1600  Clinical Encounter Type  Visited With Patient and family together  Visit Type Spiritual support;Critical Care  Referral From Nurse  Spiritual Encounters  Spiritual Needs Prayer;Emotional

## 2017-02-16 NOTE — NC FL2 (Signed)
[place] MEDICAID FL2 LEVEL OF CARE SCREENING TOOL     IDENTIFICATION  Patient Name: Zachary Peterson Birthdate: 12-09-1931 Sex: male Admission Date (Current Location): 02/15/2017  [place]   Facility and Address:  [facility], [address], [place], [place] [address]      Provider Number: [id number]  Attending Physician Name and Address:  Milagros LollSudini, Srikar, MD  Relative Name and Phone Number:  Claudia Desanctisleanor [facility](Spouse) [phone] MetroDarlene Mach (Daughter) [phone]    Current Level of Care: Hospital Recommended Level of Care: Skilled Nursing Facility Prior Approval Number:    Date Approved/Denied:   PASRR Number: [id number]  Discharge Plan: SNF    Current Diagnoses: Patient Active Problem List   Diagnosis Date Noted  . HCAP (healthcare-associated pneumonia) 02/15/2017  . Acute respiratory failure with hypoxia (HCC)   . Dysphagia   . Chronic obstructive pulmonary disease (HCC)   . PAF (paroxysmal atrial fibrillation) (HCC)   . Benign essential HTN   . Hypophosphatemia   . Leukocytosis   . Acute blood loss anemia   . Aspiration pneumonia (HCC) 01/27/2017  . Endotracheally intubated 01/27/2017  . Acute hypercapnic respiratory failure (HCC)   . At high risk for aspiration   . (HFpEF) heart failure with preserved ejection fraction (HCC) 08/29/2016  . Persistent atrial fibrillation (HCC) 06/06/2016  . Encounter for therapeutic drug monitoring 03/14/2016  . Difficulty in walking, not elsewhere classified   . Muscle weakness (generalized)   . Diarrhea   . Seizure (HCC) 02/21/2016  . Atrial fibrillation with RVR (HCC) 02/21/2016  . Viral gastroenteritis 02/21/2016  . Hypokalemia 02/21/2016  . Demand ischemia of myocardium (HCC) 02/21/2016  . Essential hypertension 02/21/2016    Orientation RESPIRATION BLADDER Height & Weight     Self, Place  O2(Acute o2, currently 4L) Incontinent Weight: 139 lb 1.8 oz (63.1  kg) Height:  5\' 9"  (175.3 cm)  BEHAVIORAL SYMPTOMS/MOOD NEUROLOGICAL BOWEL NUTRITION STATUS      Continent Diet(Dysphagia 3, thin liquids)  AMBULATORY STATUS COMMUNICATION OF NEEDS Skin   Limited Assist Verbally Normal                       Personal Care Assistance Level of Assistance  Bathing, Feeding, Dressing Bathing Assistance: Maximum assistance Feeding assistance: Limited assistance Dressing Assistance: Maximum assistance     Functional Limitations Info  Sight, Hearing, Speech Sight Info: Adequate Hearing Info: Adequate Speech Info: Adequate    SPECIAL CARE FACTORS FREQUENCY  PT (By licensed PT), OT (By licensed OT), Speech therapy     PT Frequency: 5/week OT Frequency: 5/week     Speech Therapy Frequency: 5/week      Contractures Contractures Info: Not present    Additional Factors Info  Code Status, Isolation Precautions, Psychotropic Code Status Info: Partial: In event of Cardiac or Respiratory areest, do not perform CPR, Administer ACLS meds or perform Defibrillation or Cardioversion Allergies Info: Ativan Lorazepam, Aspirin, Codeine, Oxycodone Psychotropic Info: Valium, Remeron, Lyrica   Isolation Precautions Info: Enteric Precautions     Current Medications (02/16/2017):  This is the current hospital active medication list Current Facility-Administered Medications  Medication Dose Route Frequency Provider Last Rate Last Dose  . acidophilus (RISAQUAD) capsule 1 capsule  1 capsule Oral TID WC Milagros LollSudini, Srikar, MD   1 capsule at 02/16/17 0923  . bisoprolol (ZEBETA) tablet 2.5 mg  2.5 mg Oral BID Angelina OkSalary, Montell D, MD   2.5 mg at 02/16/17 0923  .  budesonide (PULMICORT) nebulizer solution 0.5 mg  0.5 mg Nebulization BID Salary, Montell D, MD   0.5 mg at 02/16/17 0813  . calcium carbonate (TUMS - dosed in mg elemental calcium) chewable tablet 200 mg of elemental calcium  1 tablet Oral TID WC PRN Salary, Montell D, MD      . dextrose 5 %-0.45 % sodium chloride  infusion   Intravenous Continuous Salary, Montell D, MD 75 mL/hr at 02/16/17 0322    . diazepam (VALIUM) tablet 2 mg  2 mg Oral Q12H Salary, Montell D, MD   2 mg at 02/15/17 2300  . diazepam (VALIUM) tablet 5 mg  5 mg Oral BID Angelina Ok D, MD   5 mg at 02/16/17 0923  . enoxaparin (LOVENOX) injection 40 mg  40 mg Subcutaneous Q24H Salary, Montell D, MD      . ezetimibe (ZETIA) tablet 10 mg  10 mg Oral Daily Salary, Montell D, MD       And  . simvastatin (ZOCOR) tablet 80 mg  80 mg Oral q1800 Salary, Montell D, MD      . fluticasone (FLONASE) 50 MCG/ACT nasal spray 2 spray  2 spray Each Nare Daily PRN Salary, Montell D, MD      . ipratropium-albuterol (DUONEB) 0.5-2.5 (3) MG/3ML nebulizer solution 3 mL  3 mL Nebulization QID Salary, Montell D, MD   3 mL at 02/16/17 0813  . lacosamide (VIMPAT) tablet 200 mg  200 mg Oral BID Angelina Ok D, MD   200 mg at 02/16/17 0923  . metoprolol tartrate (LOPRESSOR) injection 5 mg  5 mg Intravenous Q6H PRN Salary, Montell D, MD      . mirtazapine (REMERON) tablet 15 mg  15 mg Oral QHS Salary, Montell D, MD   15 mg at 02/15/17 2252  . ondansetron (ZOFRAN-ODT) disintegrating tablet 8 mg  8 mg Oral Q8H PRN Salary, Montell D, MD      . pantoprazole (PROTONIX) EC tablet 40 mg  40 mg Oral Daily Salary, Montell D, MD   40 mg at 02/16/17 0923  . piperacillin-tazobactam (ZOSYN) IVPB 3.375 g  3.375 g Intravenous Q8H Sudini, Srikar, MD 12.5 mL/hr at 02/16/17 0922 3.375 g at 02/16/17 0922  . polyvinyl alcohol (LIQUIFILM TEARS) 1.4 % ophthalmic solution 1 drop  1 drop Both Eyes TID PRN Salary, Montell D, MD      . potassium chloride (KLOR-CON) packet 40 mEq  40 mEq Oral Once Salary, Montell D, MD      . pregabalin (LYRICA) capsule 75 mg  75 mg Oral BID Angelina Ok D, MD   75 mg at 02/16/17 0923  . tamsulosin (FLOMAX) capsule 0.8 mg  0.8 mg Oral QPC breakfast Salary, Montell D, MD   0.8 mg at 02/16/17 0923  . tiotropium (SPIRIVA) inhalation capsule 18 mcg  18 mcg  Inhalation Daily Salary, Jetty Duhamel D, MD   18 mcg at 02/16/17 0924  . warfarin (COUMADIN) tablet 2.5 mg  2.5 mg Oral Daily Salary, Montell D, MD         Discharge Medications: Please see discharge summary for a list of discharge medications.  Relevant Imaging Results:  Relevant Lab Results:   Additional Information SS# [id number]  Judi Cong, LCSW

## 2017-02-16 NOTE — Clinical Social Work Note (Signed)
CSW aware through chart review that this patient admitted from a facility ([facility]). CSW will assess when able.  Zachary PonderKaren Martha Kelle Ruppert, MSW, Theresia MajorsLCSWA [phone]

## 2017-02-16 NOTE — Progress Notes (Signed)
SLP Cancellation Note  Patient Details Name: Zachary RavelSamuel D Peterson MRN: [id number] DOB: 12-Sep-1931   Cancelled treatment:       Reason Eval/Treat Not Completed: Medical issues which prohibited therapy Reviewed chart and spoke with nsg. Pt just transferred to CCU with rapid response. Per chart review pt recently had MBSS on previous admission and was discharged on Dys 3 with thin. Chin tuck and double swallow with solids d/t backflow of solids from cervical esophagus. Will defer evaluation today until pt more medically stable.    [place],Naylea Wigington 02/16/2017, 3:21 PM

## 2017-02-16 NOTE — Progress Notes (Signed)
PT Cancellation Note  Patient Details Name: Zachary RavelSamuel D Ledonne MRN: [id number] DOB: 08-Oct-1931   Cancelled Treatment:    Reason Eval/Treat Not Completed: Other (comment).  Per chart review, rapid response called by nursing and pt transferred to CCU today. D/t pt transferring to higher level of care, per PT protocol require new PT consult in order to continue therapy (will discontinue current PT order d/t this).  Please re-consult PT when pt is medically appropriate to participate in PT.  Hendricks LimesEmily Eveleigh Crumpler, PT 02/16/17, 5:03 PM [phone]

## 2017-02-16 NOTE — Progress Notes (Signed)
Patient ID: Derrick RavelSamuel D Kerekes, male   DOB: 08-14-1931, 84 y.o.   MRN: [id number] Pulmonary/critical care  I had a long discussion with family members present to includes wife daughter and granddaughter. They related to me Mr. Renella CunasMcGee's deteriorating medical condition over the past several months frequent hospitalization and admission. They also related that he was intubated for aspiration pneumonia at: Hospital and subsequently discharged. I discussed with them his issue of hypotension, most likely sepsis, probable multi lobar pneumonia, concern for aspiration. Relate to me that they don't want him intubated, they don't want central access, they want us to fix a week and fixed without being too aggressive. Will make patient DO NOT RESUSCITATE, fluid resuscitation, low dose pressors if needed, goal map of [address]  Mairead Schwarzkopf, D.O.

## 2017-02-16 NOTE — Consult Note (Signed)
Reason for Consult: Hypotension Referring Physician: [name] is an 84 y.o. male.   HPI: [name] is an 84 year old gentleman with a past medical history remarkable for COPD, hypertension, hyperlipidemia, atrial fibrillation, seizure, depression, back pain who was recently discharged from: Hospital for pneumonia January 21, was in inpatient rehabilitation when he was sent to the emergency department for progressive shortness of breath, tachycardia, noted to be hypoxemic on supplemental oxygen and diarrhea for 2 days. In emergency department he was tachycardic tachypneic and hypoxemic chest x-ray revealed multi focal pneumonia patient was admitted for treatment. He was started on Solu-Medrol, budesonide, albuterol, Atrovent, Zosyn and anticoagulation therapy was continued. This morning patient developed worsening hypotension requiring fluid resuscitation. He was subsequently transferred to the intensive care unit for further evaluation and management. Presently he is resting comfortably in no acute distress on nasal cannula. Systolic pressures were in the 60s receiving a normal saline bolus  Past Medical History:  Diagnosis Date  . Back pain   . COPD (chronic obstructive pulmonary disease) ([place])   . Depression   . Fatigue    a. in setting of Afib, improved following DCCV.  [name] Hearing loss   . History of GI bleed 1976  . Hyperlipemia   . Hypertension   . Migraine   . Palpitations   . Persistent atrial fibrillation ([place])    a. 02/2016 Echo: EF 66-65%, mild MR;  b. CHA2DS2VASc = 3-->coumadin;  c. 05/2016 TEE/DCCV: EF 45-50%, triv AI, mild to mod MR, no LA/LAA/RA/RAA thrombus, mod to sev TR-->successful DCCV.  . Seizure ([place])   . Syncope    a. related to seizure disorder    Past Surgical History:  Procedure Laterality Date  . APPENDECTOMY    . BACK SURGERY    . CARDIOVERSION N/A 06/06/2016   Procedure: CARDIOVERSION;  Surgeon: Nelva Bush, MD;  Location: [facility] ORS;   Service: Cardiovascular;  Laterality: N/A;  . CATARACT EXTRACTION    . hemmoridectomy    . HERNIA REPAIR    . TEE WITHOUT CARDIOVERSION N/A 06/06/2016   Procedure: TRANSESOPHAGEAL ECHOCARDIOGRAM (TEE);  Surgeon: Nelva Bush, MD;  Location: [facility] ORS;  Service: Cardiovascular;  Laterality: N/A;    Family History  Problem Relation Age of Onset  . Heart disease Mother   . Hypertension Father     Social History:  reports that he has quit smoking. He has quit using smokeless tobacco. He reports that he does not drink alcohol or use drugs.  Allergies:  Allergies  Allergen Reactions  . Ativan [Lorazepam] Anaphylaxis  . Aspirin Other (See Comments)    CAUSES ULCERS  . Codeine Nausea And Vomiting  . Oxycodone Nausea And Vomiting    Medications: I have reviewed the patient's current medications.  Results for orders placed or performed during the hospital encounter of 02/15/17 (from the past 48 hour(s))  Comprehensive metabolic panel     Status: Abnormal   Collection Time: 02/15/17 11:10 AM  Result Value Ref Range   Sodium 136 135 - 145 mmol/L   Potassium 4.4 3.5 - 5.1 mmol/L   Chloride 101 101 - 111 mmol/L   CO2 25 22 - 32 mmol/L   Glucose, Bld 140 (H) 65 - 99 mg/dL   BUN 23 (H) 6 - 20 mg/dL   Creatinine, Ser 1.45 (H) 0.61 - 1.24 mg/dL   Calcium 8.8 (L) 8.9 - 10.3 mg/dL   Total Protein 8.2 (H) 6.5 - 8.1 g/dL   Albumin 3.3 (L) 3.5 -  5.0 g/dL   AST 30 15 - 41 U/L   ALT 16 (L) 17 - 63 U/L   Alkaline Phosphatase 100 38 - 126 U/L   Total Bilirubin 0.5 0.3 - 1.2 mg/dL   GFR calc non Af Amer 43 (L) >60 mL/min   GFR calc Af Amer 50 (L) >60 mL/min    Comment: (NOTE) The eGFR has been calculated using the CKD EPI equation. This calculation has not been validated in all clinical situations. eGFR's persistently <60 mL/min signify possible Chronic Kidney Disease.    Anion gap 10 5 - 15    Comment: Performed at [facility], [address] [place], [place] [address]   Troponin I     Status: None   Collection Time: 02/15/17 11:10 AM  Result Value Ref Range   Troponin I <0.03 <0.03 ng/mL    Comment: Performed at [facility], [address] [place], [place] [address]  Lactic acid, plasma     Status: None   Collection Time: 02/15/17 11:10 AM  Result Value Ref Range   Lactic Acid, Venous 1.9 0.5 - 1.9 mmol/L    Comment: Performed at [facility], [address] [place], [place] [address]  CBC with Differential     Status: Abnormal   Collection Time: 02/15/17 11:10 AM  Result Value Ref Range   WBC 9.2 3.8 - 10.6 K/uL   RBC 3.85 (L) 4.40 - 5.90 MIL/uL   Hemoglobin 12.4 (L) 13.0 - 18.0 g/dL   HCT 36.2 (L) 40.0 - 52.0 %   MCV 94.2 80.0 - 100.0 fL   MCH 32.2 26.0 - 34.0 pg   MCHC 34.2 32.0 - 36.0 g/dL   RDW 15.5 (H) 11.5 - 14.5 %   Platelets 588 (H) 150 - 440 K/uL   Neutrophils Relative % 79 %   Neutro Abs 7.3 (H) 1.4 - 6.5 K/uL   Lymphocytes Relative 12 %   Lymphs Abs 1.1 1.0 - 3.6 K/uL   Monocytes Relative 8 %   Monocytes Absolute 0.7 0.2 - 1.0 K/uL   Eosinophils Relative 0 %   Eosinophils Absolute 0.0 0 - 0.7 K/uL   Basophils Relative 1 %   Basophils Absolute 0.1 0 - 0.1 K/uL    Comment: Performed at [facility], [address] [place], [place] [address]  Culture, blood (routine x 2)     Status: None (Preliminary result)   Collection Time: 02/15/17 11:10 AM  Result Value Ref Range   Specimen Description BLOOD BLOOD LEFT HAND    Special Requests      BOTTLES DRAWN AEROBIC AND ANAEROBIC Blood Culture adequate volume   Culture      NO GROWTH < 24 HOURS Performed at [facility], [address] [place], [place] [address]    Report Status PENDING   Urinalysis, Complete w Microscopic     Status: Abnormal   Collection Time: 02/15/17 11:38 AM  Result Value Ref Range   Color, Urine YELLOW (A) YELLOW   APPearance HAZY (A) CLEAR   Specific Gravity, Urine 1.018 1.005 - 1.030   pH 5.0 5.0 - 8.0    Glucose, UA NEGATIVE NEGATIVE mg/dL   Hgb urine dipstick MODERATE (A) NEGATIVE   Bilirubin Urine NEGATIVE NEGATIVE   Ketones, ur NEGATIVE NEGATIVE mg/dL   Protein, ur 30 (A) NEGATIVE mg/dL   Nitrite NEGATIVE NEGATIVE   Leukocytes, UA SMALL (A) NEGATIVE   RBC / HPF 6-30 0 - 5 RBC/hpf  WBC, UA 6-30 0 - 5 WBC/hpf   Bacteria, UA NONE SEEN NONE SEEN   Squamous Epithelial / LPF NONE SEEN NONE SEEN    Comment: Performed at [facility], [address] [place], [place] [address]  Urine Drug Screen, Qualitative     Status: Abnormal   Collection Time: 02/15/17 11:38 AM  Result Value Ref Range   Tricyclic, Ur Screen NONE DETECTED NONE DETECTED   Amphetamines, Ur Screen NONE DETECTED NONE DETECTED   MDMA (Ecstasy)Ur Screen NONE DETECTED NONE DETECTED   Cocaine Metabolite,Ur [place] NONE DETECTED NONE DETECTED   Opiate, Ur Screen NONE DETECTED NONE DETECTED   Phencyclidine (PCP) Ur S NONE DETECTED NONE DETECTED   Cannabinoid 50 Ng, Ur [place] NONE DETECTED NONE DETECTED   Barbiturates, Ur Screen NONE DETECTED NONE DETECTED   Benzodiazepine, Ur Scrn POSITIVE (A) NONE DETECTED   Methadone Scn, Ur NONE DETECTED NONE DETECTED    Comment: (NOTE) Tricyclics + metabolites, urine    Cutoff 1000 ng/mL Amphetamines + metabolites, urine  Cutoff 1000 ng/mL MDMA (Ecstasy), urine              Cutoff 500 ng/mL Cocaine Metabolite, urine          Cutoff 300 ng/mL Opiate + metabolites, urine        Cutoff 300 ng/mL Phencyclidine (PCP), urine         Cutoff 25 ng/mL Cannabinoid, urine                 Cutoff 50 ng/mL Barbiturates + metabolites, urine  Cutoff 200 ng/mL Benzodiazepine, urine              Cutoff 200 ng/mL Methadone, urine                   Cutoff 300 ng/mL The urine drug screen provides only a preliminary, unconfirmed analytical test result and should not be used for non-medical purposes. Clinical consideration and professional judgment should be applied to any positive drug screen result due  to possible interfering substances. A more specific alternate chemical method must be used in order to obtain a confirmed analytical result. Gas chromatography / mass spectrometry (GC/MS) is the preferred confirmat ory method. Performed at [facility], [address] [place], [place] [address]   Blood gas, venous     Status: None   Collection Time: 02/15/17 11:38 AM  Result Value Ref Range   pH, Ven 7.34 7.250 - 7.430   pCO2, Ven 50 44.0 - 60.0 mmHg   pO2, Ven 33.0 32.0 - 45.0 mmHg   Bicarbonate 27.0 20.0 - 28.0 mmol/L   Acid-Base Excess 0.5 0.0 - 2.0 mmol/L   O2 Saturation 59.1 %   Patient temperature 37.0    Collection site VEIN    Sample type VENOUS     Comment: Performed at [facility], [address] [place], [place] [address]  Strep pneumoniae urinary antigen     Status: None   Collection Time: 02/15/17 11:38 AM  Result Value Ref Range   Strep Pneumo Urinary Antigen NEGATIVE NEGATIVE    Comment:        Infection due to S. pneumoniae cannot be absolutely ruled out since the antigen present may be below the detection limit of the test. Performed at [facility], [address], [place], [place] [address]   Culture, blood (routine x 2)     Status: None (Preliminary result)   Collection Time: 02/15/17 11:39 AM  Result Value Ref Range   Specimen Description BLOOD Blood Culture adequate volume    Special Requests BLOOD LEFT FOREARM    Culture      NO GROWTH < 24 HOURS Performed at [facility], [address] [place], [place] [address]    Report Status PENDING   Brain natriuretic peptide     Status: Abnormal   Collection Time: 02/15/17  1:37 PM  Result Value Ref Range   B Natriuretic Peptide 140.0 (H) 0.0 - 100.0 pg/mL    Comment: Performed at [facility], [address] [place], [place] [address]  MRSA PCR Screening     Status: None   Collection Time: 02/15/17  4:34 PM  Result Value Ref Range   MRSA by PCR  NEGATIVE NEGATIVE    Comment:        The GeneXpert MRSA Assay (FDA approved for NASAL specimens only), is one component of a comprehensive MRSA colonization surveillance program. It is not intended to diagnose MRSA infection nor to guide or monitor treatment for MRSA infections. Performed at [facility], [address] [place], [place] [address]   Lactic acid, plasma     Status: None   Collection Time: 02/15/17  5:00 PM  Result Value Ref Range   Lactic Acid, Venous 1.3 0.5 - 1.9 mmol/L    Comment: Performed at [facility], [address] [place], [place] [address]  Prealbumin     Status: Abnormal   Collection Time: 02/15/17  9:39 PM  Result Value Ref Range   Prealbumin 10.9 (L) 18 - 38 mg/dL    Comment: Performed at [facility] [address] [place], [place] [address]  Protime-INR     Status: None   Collection Time: 02/15/17  9:39 PM  Result Value Ref Range   Prothrombin Time 14.0 11.4 - 15.2 seconds   INR 1.09     Comment: Performed at [facility], [address] [place], [place] [address]  Procalcitonin - Baseline     Status: None   Collection Time: 02/15/17  9:39 PM  Result Value Ref Range   Procalcitonin 0.20 ng/mL    Comment:        Interpretation: PCT (Procalcitonin) <= 0.5 ng/mL: Systemic infection (sepsis) is not likely. Local bacterial infection is possible. (NOTE)       Sepsis PCT Algorithm           Lower Respiratory Tract                                      Infection PCT Algorithm    ----------------------------     ----------------------------         PCT < 0.25 ng/mL                PCT < 0.10 ng/mL         Strongly encourage             Strongly discourage   discontinuation of antibiotics    initiation of antibiotics    ----------------------------     -----------------------------       PCT 0.25 - 0.50 ng/mL            PCT 0.10 - 0.25 ng/mL               OR       >80% decrease in PCT  Discourage  initiation of                                            antibiotics      Encourage discontinuation           of antibiotics    ----------------------------     -----------------------------         PCT >= 0.50 ng/mL              PCT 0.26 - 0.50 ng/mL               AND        <80% decrease in PCT             Encourage initiation of                                             antibiotics       Encourage continuation           of antibiotics    ----------------------------     -----------------------------        PCT >= 0.50 ng/mL                  PCT > 0.50 ng/mL               AND         increase in PCT                  Strongly encourage                                      initiation of antibiotics    Strongly encourage escalation           of antibiotics                                     -----------------------------                                           PCT <= 0.25 ng/mL                                                 OR                                        > 80% decrease in PCT                                     Discontinue / Do not initiate  antibiotics Performed at [facility], [address] [place], [place] [address]   Protime-INR     Status: None   Collection Time: 02/16/17  5:42 AM  Result Value Ref Range   Prothrombin Time 14.7 11.4 - 15.2 seconds   INR 1.16     Comment: Performed at [facility], [address] [place], [place] [address]  CBC with Differential/Platelet     Status: Abnormal   Collection Time: 02/16/17  7:00 AM  Result Value Ref Range   WBC 4.8 3.8 - 10.6 K/uL   RBC 3.24 (L) 4.40 - 5.90 MIL/uL   Hemoglobin 10.2 (L) 13.0 - 18.0 g/dL   HCT 30.7 (L) 40.0 - 52.0 %   MCV 94.6 80.0 - 100.0 fL   MCH 31.5 26.0 - 34.0 pg   MCHC 33.3 32.0 - 36.0 g/dL   RDW 15.5 (H) 11.5 - 14.5 %   Platelets 430 150 - 440 K/uL   Neutrophils Relative % 80 %   Neutro Abs 3.8 1.4 - 6.5 K/uL    Lymphocytes Relative 11 %   Lymphs Abs 0.5 (L) 1.0 - 3.6 K/uL   Monocytes Relative 8 %   Monocytes Absolute 0.4 0.2 - 1.0 K/uL   Eosinophils Relative 0 %   Eosinophils Absolute 0.0 0 - 0.7 K/uL   Basophils Relative 1 %   Basophils Absolute 0.1 0 - 0.1 K/uL    Comment: Performed at [facility], [address] [place], [place] [address]    Dg Chest Portable 1 View  Result Date: 02/15/2017 CLINICAL DATA:  Shortness of breath, tachycardia, pneumonia EXAM: PORTABLE CHEST 1 VIEW COMPARISON:  02/02/2017 FINDINGS: Multifocal patchy opacities the bilateral upper and lower lobes, similar to the prior, although with mild progression in the left upper lobe. No pleural effusion or pneumothorax. The heart is normal in size IMPRESSION: Multifocal pneumonia, with mild progression in the left upper lobe. Electronically Signed   By: Julian Hy M.D.   On: 02/15/2017 11:26    ROS minimal review of systems secondary to patient's status  Blood pressure (!) 74/28, pulse 92, temperature 100 F (37.8 C), temperature source Oral, resp. rate 16, height _0  (1.753 m), weight 139 lb 1.8 oz (63.1 kg), SpO2 95 %. Physical Exam Vital signs: Please see the listed vital signs Patient is awake, in no acute distress, his blood pressure however is low with systolic pressure of 74 HEENT: No oral lesions appreciated, trachea is midline, no accessory muscle utilization Cardiovascular: Irregularly irregular rhythm with controlled ventricular response Pulmonary: Coarse rhonchi appreciated Abdominal: Positive bowel sounds, hypoactive Extremities: No clubbing cyanosis or edema noted Neurologic: Patient was all extremities Limited exam at this time  Assessment/Plan:  Septic shock. Most likely source can be multilobar pneumonia. Patient moved to the intensive care unit, volume resuscitation, will place on vancomycin and Zosyn. Patient is already on systemic steroids and supplemental oxygen. Patient may need  central access and IV pressors  COPD exacerbation. Presently on albuterol, Atrovent, Solu-Medrol and broad-spectrum coverage  Renal insufficiency. BUN/creatinine 23/1.45, supporting hemodynamics with fluid and pressors. We'll avoid nephrotoxic agents  Anemia. No active bleeding noted at this time  Diarrhea. C. difficile is pending    Hermelinda Dellen, DO  Nealy Karapetian 02/16/2017, 2:26 PM

## 2017-02-16 NOTE — Progress Notes (Signed)
Rapid response called by nursing staff.  Patient noted to have blood pressure 79/33.  I was on the floor and responded immediately.  Patient is more drowsy.  Mild respiratory distress is similar to today morning. Blood pressure repeated in systolic blood pressure still in the 70s.  Poor oral intake.  Physical examination  S1-S2  Bilateral coarse breath sounds with wheezing Abdomen soft and non tender.  *Bilateral healthcare acquired versus aspiration pneumonia.  Severe sepsis.  Check lactic acid.  Blood cultures pending.  Will repeat chest x-ray.  Discussed with Dr. Lonn Georgiaonforti of ICU.  Transfer to ICU stat.  Patient is critically ill. On IV Zosyn.  *Diarrhea.  C. difficile pending.  Enteric precautions.  Patient is partial code.  Poor long-term prognosis.    Critical care time spent 35 minutes

## 2017-02-16 NOTE — Progress Notes (Signed)
Pharmacy Antibiotic Note  Zachary Peterson is a 84 y.o. male admitted on 02/15/2017 with multifocal PNA/HCAP/possible aspiration pneumonia.  Pharmacy has been consulted for vancomycin and cefepime dosing.  Patient received vancomycin 1000mg  once and zosyn 3.375g once in ED.   Plan: Will begin  Vancomycin 1000 IV every 24 hours.  Goal trough 15-20 mcg/mL.  Cefepime 2g Q12H due to CrCl of 33.4.   Pharmacy will continue to monitor and adjust as needed.   Ke=0.03 T1/2=21 hr Vd=46 Cmin 17 Stack dose @ 12 hours  Height: 5\' 9"  (175.3 cm) Weight: 139 lb 1.8 oz (63.1 kg) IBW/kg (Calculated) : 70.7  Temp (24hrs), Avg:100.5 F (38.1 C), Min:98.8 F (37.1 C), Max:102.7 F (39.3 C)  Recent Labs  Lab 02/15/17 1110 02/15/17 1700  WBC 9.2  --   CREATININE 1.45*  --   LATICACIDVEN 1.9 1.3    Estimated Creatinine Clearance: 34.5 mL/min (A) (by C-G formula based on SCr of 1.45 mg/dL (H)).    Allergies  Allergen Reactions  . Ativan [Lorazepam] Anaphylaxis  . Aspirin Other (See Comments)    CAUSES ULCERS  . Codeine Nausea And Vomiting  . Oxycodone Nausea And Vomiting    Antimicrobials this admission: Zosyn x1 Vancomycin 2/8 >> dc 2/9 Cefepime 2/9>> Zosyn 2/9  Dose adjustments this admission:   Microbiology results: BCx: 2/8 >> NGTD Sputum: 2/8 >> sent   Thank you for allowing pharmacy to be a part of this patient's care.  Yolanda BonineHannah Lifsey, PharmD Pharmacy Resident 02/16/2017 6:57 AM

## 2017-02-16 NOTE — Progress Notes (Signed)
Chaplain gave follow up support. Presented a prayer shawl to wife of the Pt as she is making a difficult decision to DNR. Family was disappointed in not being able to have more than 2 members back.    02/16/17 2000  Clinical Encounter Type  Visited With Patient and family together  Visit Type Spiritual support  Referral From Chaplain  Spiritual Encounters  Spiritual Needs Prayer;Emotional;Grief support

## 2017-02-16 NOTE — Consult Note (Signed)
ANTICOAGULATION CONSULT NOTE - Initial Consult  Pharmacy Consult for Warfarin dosing and monitoring  Indication: atrial fibrillation  Allergies  Allergen Reactions  . Ativan [Lorazepam] Anaphylaxis  . Aspirin Other (See Comments)    CAUSES ULCERS  . Codeine Nausea And Vomiting  . Oxycodone Nausea And Vomiting   Patient Measurements: Height: 5\' 9"  (175.3 cm) Weight: 139 lb 1.8 oz (63.1 kg) IBW/kg (Calculated) : 70.7  Vital Signs: Temp: 100 F (37.8 C) (02/09 1352) Temp Source: Oral (02/09 1352) BP: 74/28 (02/09 1352) Pulse Rate: 92 (02/09 1352)  Labs: Recent Labs    02/15/17 1110 02/15/17 2139 02/16/17 0542 02/16/17 0700  HGB 12.4*  --   --  10.2*  HCT 36.2*  --   --  30.7*  PLT 588*  --   --  430  LABPROT  --  14.0 14.7  --   INR  --  1.09 1.16  --   CREATININE 1.45*  --   --   --   TROPONINI <0.03  --   --   --     Estimated Creatinine Clearance: 34.5 mL/min (A) (by C-G formula based on SCr of 1.45 mg/dL (H)).   Medical History: Past Medical History:  Diagnosis Date  . Back pain   . COPD (chronic obstructive pulmonary disease) ([facility])   . Depression   . Fatigue    a. in setting of Afib, improved following DCCV.  [name]. Hearing loss   . History of GI bleed 1976  . Hyperlipemia   . Hypertension   . Migraine   . Palpitations   . Persistent atrial fibrillation ([facility])    a. 02/2016 Echo: EF 66-65%, mild MR;  b. CHA2DS2VASc = 3-->coumadin;  c. 05/2016 TEE/DCCV: EF 45-50%, triv AI, mild to mod MR, no LA/LAA/RA/RAA thrombus, mod to sev TR-->successful DCCV.  . Seizure ([facility])   . Syncope    a. related to seizure disorder    Assessment: Pharmacy consulted for warfarin dosing and monitoring in 84 yo male with PMH of A. Fib. [facility] takes warfarin 2.5mg  daily. INR on admission was subtherapeutic at 1.09.  DATE INR DOSE 2/8 1.09 2.5mg  2/9 1.16 4mg    Goal of Therapy:  INR 2-3 Monitor platelets by anticoagulation protocol: Yes   Plan:   Patient is on enoxaparin 40mg  daily. Will continue until INR is therapeutic.  Patient is also receiving antibiotics, which can increase INR. Will give Warfarin 4mg  x 1 dose tonight.   INRs daily until therapeutic and while on Abx.   Gardner CandleSheema M Bernadett Milian, PharmD, BCPS Clinical Pharmacist 02/16/2017 2:24 PM

## 2017-02-16 NOTE — Progress Notes (Addendum)
Blood pressure 79/33 with dinemap, 60/30 manually taken, rapid response called, Dr. Elpidio AnisSudini at bedside. Patient has increased lethargy, not responding compared to baseline. Received verbal order for NS 500ml bolus. After rapid response team arrived, received verbal order from Dr. Elpidio AnisSudini to send patient to CCU. Explained patient's status to his wife, assisted her in a wheelchair for her to go with the patient to CCU in the waiting room. Also discussed patient's status with granddaughter. Patient is a limited code.

## 2017-02-16 NOTE — Progress Notes (Addendum)
Pt came from 1C today as Rapid response with low BP. He is lethargic, weak. Resp non labored. Lungs diminished with rhonchi. Afebrile. SR on monitor. He had 1L NS bolus. On zosyn and vancomycin. Pts  Wife, daughter and granddaughter are at bedside. Spoke at length with Dr Lonn Georgiaonforti. Decision to make DNR. Not wanting to accelerate care other than possibly low dose IV pressor.  He appears comfortable. Wife at bedside tells me she is comfortable with this decision. There is one granddaughter that does not want pt to be DNR. It was suggested we support his wife in this decision because it is ultimately her decision when pt is unable to make it for himself.

## 2017-02-16 NOTE — Progress Notes (Signed)
[facility] - [place] [facility]   PATIENT NAME: Zachary Peterson    MR#:  [id number]  DATE OF BIRTH:  October 15, 1931  SUBJECTIVE:  CHIEF COMPLAINT:   Chief Complaint  Patient presents with  . Shortness of Breath    runny nose, Recent Dx viral Pneumonia, norovirus   Patient is feeling weak.  Drowsy.  Poor oral intake.  On 4 L oxygen.  REVIEW OF SYSTEMS:    Review of Systems  Unable to perform ROS: Mental status change    DRUG ALLERGIES:   Allergies  Allergen Reactions  . Ativan [Lorazepam] Anaphylaxis  . Aspirin Other (See Comments)    CAUSES ULCERS  . Codeine Nausea And Vomiting  . Oxycodone Nausea And Vomiting    VITALS:  Blood pressure (!) 74/28, pulse 92, temperature 100 F (37.8 C), temperature source Oral, resp. rate 16, height 5\' 9"  (1.753 m), weight 63.1 kg (139 lb 1.8 oz), SpO2 95 %.  PHYSICAL EXAMINATION:   Physical Exam  GENERAL:  84 y.o.-year-old patient lying in the bed with mild respiratory distress EYES: Pupils equal, round, reactive to light and accommodation. No scleral icterus. Extraocular muscles intact.  HEENT: Head atraumatic, normocephalic. Oropharynx and nasopharynx clear.  NECK:  Supple, no jugular venous distention. No thyroid enlargement, no tenderness.  LUNGS: Bilateral wheezing and decreased air entry CARDIOVASCULAR: S1, S2 normal. No murmurs, rubs, or gallops.  ABDOMEN: Soft, nontender, nondistended. Bowel sounds present. No organomegaly or mass.  EXTREMITIES: No cyanosis, clubbing or edema b/l.    NEUROLOGIC: Moving all 4 extremities PSYCHIATRIC: The patient is alert and awake.  Oriented to place but not time SKIN: No obvious rash, lesion, or ulcer.   LABORATORY PANEL:   CBC Recent Labs  Lab 02/16/17 0700  WBC 4.8  HGB 10.2*  HCT 30.7*  PLT 430   ------------------------------------------------------------------------------------------------------------------ Chemistries  Recent Labs  Lab 02/15/17 1110  NA  136  K 4.4  CL 101  CO2 25  GLUCOSE 140*  BUN 23*  CREATININE 1.45*  CALCIUM 8.8*  AST 30  ALT 16*  ALKPHOS 100  BILITOT 0.5   ------------------------------------------------------------------------------------------------------------------  Cardiac Enzymes Recent Labs  Lab 02/15/17 1110  TROPONINI <0.03   ------------------------------------------------------------------------------------------------------------------  RADIOLOGY:  Dg Chest Portable 1 View  Result Date: 02/15/2017 CLINICAL DATA:  Shortness of breath, tachycardia, pneumonia EXAM: PORTABLE CHEST 1 VIEW COMPARISON:  02/02/2017 FINDINGS: Multifocal patchy opacities the bilateral upper and lower lobes, similar to the prior, although with mild progression in the left upper lobe. No pleural effusion or pneumothorax. The heart is normal in size IMPRESSION: Multifocal pneumonia, with mild progression in the left upper lobe. Electronically Signed   By: Charline BillsSriyesh  Krishnan M.D.   On: 02/15/2017 11:26     ASSESSMENT AND PLAN:   *Multifocal healthcare acquired pneumonia .  Also at risk for aspiration pneumonia [facility] and treated for pneumonia and sent to rehab. MRSA PCR negative.  Stop vancomycin. Change cefepime to IV Zosyn due to risk of aspiration. Culture sent and pending.  *Acute COPD exacerbation.  Nebulizers. Start IV steroids.  *paroxysmal atrial fibrillation.  On beta-blocker therapy.  Coumadin.  *Severe protein calorie malnutrition.  Dietitian consult.  *Acute diarrhea. Check for C. difficile.  Recent antibiotic use. Enteric precautions.  *Acute on chronic hypoxic respiratory failure due to pneumonia.  Wean oxygen as tolerated.  Nebulizers as needed.  *Acute toxic and metabolic encephalopathy.  Improving.  *CKD stage III is stable.  *Anemia of chronic disease  *  DVT prophylaxis.  On Coumadin.  All the records are reviewed and case discussed with Care  Management/Social Worker Management plans discussed with the patient, family and they are in agreement.  CODE STATUS: Partial code.  Okay to intubate.  DVT Prophylaxis: SCDs  TOTAL TIME TAKING CARE OF THIS PATIENT: 35 minutes.   POSSIBLE D/C IN 2-3 DAYS, DEPENDING ON CLINICAL CONDITION.  Molinda Bailiff Tobey Lippard M.D on 02/16/2017 at 2:09 PM  Between 7am to 6pm - Pager - [phone]  After 6pm go to [email or web address] - password [email or web address]  [facility] [place] [facility]  Office  [phone]  CC: Primary care physician; Ignacia Palma., MD  Note: This dictation was prepared with Dragon dictation along with smaller phrase technology. Any transcriptional errors that result from this process are unintentional.

## 2017-02-17 LAB — CREATININE, SERUM
Creatinine, Ser: 1.1 mg/dL (ref 0.61–1.24)
GFR calc non Af Amer: >60 mL/min (ref >60)

## 2017-02-17 LAB — BASIC METABOLIC PANEL
ANION GAP: 7 (ref 5–15)
BUN: 17 mg/dL (ref 6–20)
CO2: 22 mmol/L (ref 22–32)
Calcium: 7.4 mg/dL — ABNORMAL LOW (ref 8.9–10.3)
Chloride: 105 mmol/L (ref 101–111)
Creatinine, Ser: 1.02 mg/dL (ref 0.61–1.24)
Glucose, Bld: 189 mg/dL — ABNORMAL HIGH (ref 65–99)
POTASSIUM: 4.1 mmol/L (ref 3.5–5.1)
Sodium: 134 mmol/L — ABNORMAL LOW (ref 135–145)

## 2017-02-17 LAB — MAGNESIUM: MAGNESIUM: 1.7 mg/dL (ref 1.7–2.4)

## 2017-02-17 LAB — HIV ANTIBODY (ROUTINE TESTING W REFLEX): HIV SCREEN 4TH GENERATION: NONREACTIVE

## 2017-02-17 LAB — PHOSPHORUS: Phosphorus: 2.5 mg/dL (ref 2.5–4.6)

## 2017-02-17 LAB — PROTIME-INR
INR: 1.13
Prothrombin Time: 14.4 seconds (ref 11.4–15.2)

## 2017-02-17 MED ORDER — VANCOMYCIN HCL IN DEXTROSE 1-5 GM/200ML-% IV SOLN
1000.0000 mg | INTRAVENOUS | Status: DC
  Administered 2017-02-17: 1000 mg via INTRAVENOUS
  Filled 2017-02-17 (×2): qty 200

## 2017-02-17 MED ORDER — WARFARIN SODIUM 4 MG PO TABS
4.0000 mg | ORAL_TABLET | Freq: Once | ORAL | Status: AC
  Administered 2017-02-17: 4 mg via ORAL
  Filled 2017-02-17: qty 1

## 2017-02-17 MED ORDER — ADULT MULTIVITAMIN W/MINERALS CH
1.0000 | ORAL_TABLET | Freq: Every day | ORAL | Status: DC
  Administered 2017-02-18 – 2017-02-21 (×4): 1 via ORAL
  Filled 2017-02-17 (×4): qty 1

## 2017-02-17 MED ORDER — IPRATROPIUM-ALBUTEROL 0.5-2.5 (3) MG/3ML IN SOLN
3.0000 mL | Freq: Four times a day (QID) | RESPIRATORY_TRACT | Status: DC
  Administered 2017-02-17 – 2017-02-23 (×24): 3 mL via RESPIRATORY_TRACT
  Filled 2017-02-17 (×24): qty 3

## 2017-02-17 NOTE — Progress Notes (Signed)
Initial Nutrition Assessment  DOCUMENTATION CODES:   Severe malnutrition in context of acute illness/injury  INTERVENTION:  Provide Ensure milkshakes as snacks from kitchen BID between meals. Make with strawberry Ensure Enlive and vanilla ice cream. Each supplement provides 480 kcal and 22 grams of protein.  Provide Magic cup TID with meals, each supplement provides 290 kcal and 9 grams of protein.  Provide daily MVI.  Encouraged adequate intake of calories and protein from meals, snacks, and oral nutrition supplements.  NUTRITION DIAGNOSIS:   Severe Malnutrition related to acute illness(inadequate intake following intubation in setting of respiratory failure s/p seizures, now PNA) as evidenced by moderate fat depletion, moderate muscle depletion, 9.1 percent weight loss over 3 weeks.  GOAL:   Patient will meet greater than or equal to 90% of their needs  MONITOR:   PO intake, Supplement acceptance, Labs, Weight trends, Skin, I & O's  REASON FOR ASSESSMENT:   Consult Assessment of nutrition requirement/status  ASSESSMENT:   84 year old male with PMHx of COPD, HLD, hx GI bleed, seizure disorder, HTN, depression, hearing loss, back pain, A-fib, recent admission to [facility] 1/19-2/2 for acute respiratory distress s/p seizures requiring intubation, after which patient discharged to [facility]. Patient now admitted with PNA and diarrhea and transferred to ICU on 2/9  for hemodynamic instability.   Met with patient and his family members at bedside. Patient reports his appetite is doing pretty good now compared to how it had been lately. Before breakfast this morning he ate a container of applesauce. From his breakfast tray he had 6 bites of eggs (were cold after breathing treatment), a container of yogurt, and coffee. He was only at rehab for a couple days, but he had not been eating well there because he felt so bad. At [facility] he ate practically nothing for 2  weeks. Noted patient was on tube feeds and then was transitioned to a dysphagia diet with thickened liquids. Prior to discharge he was approved for dysphagia 3 diet with thin liquids. He likes yogurt and ice cream. RN made patient an Ensure milkshake to drink today. His family has already ordered his lunch and dinner, too. Patient is edentulous. His dentures are at home and he does not like to eat with them in. He reports he is fine "chewing" the food on the dysphagia 3 diet.  UBW was 150-160 lbs. Patient reports he had been weight stable until his recent admission at [facility]. He was 153 lbs on 01/26/2017 and lost 13.9 lbs (9.1% body weight) over the past 3 weeks, which is significant for time frame.  Medications reviewed and include: acidophilus 1 capsule TID, Solu-Medrol 60 mg daily IV, Remeron 15 mg QHS, pantoprazole, Flomax, warfarin, D5-1/2NS at 75 mL/hr (90 grams dextrose, 306 kcal daily), phenylephrine gtt at 10 mcg/min, Zosyn, vancomycin.  Labs reviewed: Sodium 134. Potassium, Phosphorus, and Magnesium WNL.  Discussed with RN. Patient enjoys Ensure milkshakes made with ice cream (prefers strawberry or vanilla).  NUTRITION - FOCUSED PHYSICAL EXAM:    Most Recent Value  Orbital Region  Severe depletion  Upper Arm Region  Moderate depletion  Thoracic and Lumbar Region  Moderate depletion  Buccal Region  Severe depletion  Temple Region  Severe depletion  Clavicle Bone Region  Moderate depletion  Clavicle and Acromion Bone Region  Moderate depletion  Scapular Bone Region  Moderate depletion  Dorsal Hand  Moderate depletion  Patellar Region  Moderate depletion  Anterior Thigh Region  Moderate depletion  Posterior Calf Region  Moderate depletion  Edema (RD Assessment)  Mild [to bilateral upper and lower extremities]  Hair  Reviewed  Eyes  Reviewed  Mouth  Reviewed [edentulous,  dentures are at home as pt does not like to use them]  Skin  Reviewed  Nails  Reviewed     Diet Order:   Aspiration precautions Fall precautions DIET DYS 3 Room service appropriate? Yes; Fluid consistency: Thin  EDUCATION NEEDS:   No education needs have been identified at this time  Skin:  Skin Assessment: Reviewed RN Assessment(ecchymosis to bilateral arms)  Last BM:  PTA (02/14/2017 per chart)  Height:   Ht Readings from Last 1 Encounters:  02/15/17 _0  (1.753 m)    Weight:   Wt Readings from Last 1 Encounters:  02/15/17 139 lb 1.8 oz (63.1 kg)    Ideal Body Weight:  67.27 kg  BMI:  Body mass index is 20.54 kg/m.  Estimated Nutritional Needs:   Kcal:  1720-1850 (MSJ x 1.3-1.4)  Protein:  80-90 grams (1.3-1.4 grams/kg)  Fluid:  1.6-1.9 L/day (25-30 mL/kg)  Willey Blade, MS, RD, LDN Office: [phone] Pager: [phone] After Hours/Weekend Pager: [phone]

## 2017-02-17 NOTE — Progress Notes (Signed)
Patient has been hypotensive since change of shift. It was reported that the family did not want patient on pressors, but may reconsider. Changed to DNR and family does not want intubation or central line. Day shift RN reported that MD wanted patient's MAP to stay above 55. NP ordered 500 ml bolus and Neo with MAP 65 or more. Foley intact and has good output. More alert this AM and has been talking clearly to daughter at bedside asking her about her dog and telling the nurse how to use the scanner. Continue to monitor.

## 2017-02-17 NOTE — Progress Notes (Signed)
Patient ID: Zachary Peterson, male   DOB: [birth year], 84 y.o.   MRN: [id number]  [name]    SYNOPSIS   Zachary Peterson is an 85 year old gentleman with a past history of COPD, hypertension, hyperlipidemia, atrial fibrillation, seizure, depression, recently discharged for aspiration pneumonia, intubation at: Hospital we presented with shortness of breath, multi lobar pneumonia and diarrhea. Transferred to the intensive care unit for hemodynamic instability  ASSESSMENT/PLAN   Multifocal pneumonia. Patient is on vancomycin and Zosyn, is on systemic steroids and supplemental oxygen. He is oxygenating well on nasal cannula and states shortness of breath is significantly improved  Septic shock. Presently on Neo-Synephrine, improved blood pressures. Is already on steroids, has been fully resuscitated, goal map of 60 or systolic of 90  Hyponatremia saline resuscitation  Hyperglycemia on coverage  Anemia no evidence of active bleeding  Leukopenia may be secondary to sepsis and marrow suppression   INTAKE / OUTPUT:  Intake/Output Summary (Last 24 hours) at 02/17/2017 0926 Last data filed at 02/17/2017 0600 Gross per 24 hour  Intake 2030.25 ml  Output 1300 ml  Net 730.25 ml    Name: Zachary Peterson MRN: [id number] DOB: Jul 12, 1931    ADMISSION DATE:  02/15/2017  SUBJECTIVE:   Patient doing well today, is on low-dose norepinephrine, is awake, alert, feels much better and states his shortness of breath is minimal  VITAL SIGNS: Temp:  [97.5 F (36.4 C)-100 F (37.8 C)] 97.7 F (36.5 C) (02/10 0400) Pulse Rate:  [77-97] 83 (02/10 0645) Resp:  [14-24] 21 (02/10 0645) BP: (60-134)/(28-69) 98/46 (02/10 0645) SpO2:  [79 %-96 %] 95 % (02/10 0854)  PHYSICAL EXAMINATION: Physical Examination:   VS: BP (!) 98/46   Pulse 83   Temp 97.7 F (36.5 C) (Oral)   Resp (!) 21   Ht 5\' 9"  (1.753 m)   Wt 139 lb 1.8 oz (63.1 kg)   SpO2 95%   BMI 20.54 kg/m     General Appearance: No distress  Neuro: Patient much more alert, significantly improved since yesterday Pulmonary: normal breath sounds   Cardiovascular Normal S1,S2.  No m/r/g.   Abdomen: Benign, Soft, non-tender. Skin:   warm, no rashes, no ecchymosis  Extremities: normal, no cyanosis, clubbing.    LABORATORY PANEL:   CBC Recent Labs  Lab 02/16/17 1632  WBC 3.0*  HGB 9.9*  HCT 29.7*  PLT 329    Chemistries  Recent Labs  Lab 02/15/17 1110  02/17/17 0508  NA 136   < > 134*  K 4.4   < > 4.1  CL 101   < > 105  CO2 25   < > 22  GLUCOSE 140*   < > 189*  BUN 23*   < > 17  CREATININE 1.45*   < > 1.02  1.10  CALCIUM 8.8*   < > 7.4*  MG  --   --  1.7  PHOS  --   --  2.5  AST 30  --   --   ALT 16*  --   --   ALKPHOS 100  --   --   BILITOT 0.5  --   --    < > = values in this interval not displayed.    Recent Labs  Lab 02/16/17 1426  GLUCAP 121*   No results for input(s): PHART, PCO2ART, PO2ART in the last 168 hours. Recent Labs  Lab 02/15/17 1110  AST 30  ALT 16*  ALKPHOS 100  BILITOT 0.5  ALBUMIN 3.3*    Cardiac Enzymes Recent Labs  Lab 02/16/17 1632  TROPONINI <0.03    RADIOLOGY:  Dg Chest Port 1 View  Result Date: 02/16/2017 CLINICAL DATA:  Cough. EXAM: PORTABLE CHEST 1 VIEW COMPARISON:  02/15/2017 and prior chest radiographs. FINDINGS: Focal opacities within the mid and lower lungs bilaterally are unchanged from 02/15/2017 and may represent pneumonia. The cardiomediastinal silhouette is unremarkable. No new pulmonary opacities are present. There is no evidence of pleural effusion or pneumothorax. IMPRESSION: Focal mid and lower lung opacities which may represent multifocal pneumonia. This is unchanged from 02/15/2017. Electronically Signed   By: Harmon PierJeffrey  Hu M.D.   On: 02/16/2017 15:01   Dg Chest Portable 1 View  Result Date: 02/15/2017 CLINICAL DATA:  Shortness of breath, tachycardia, pneumonia EXAM: PORTABLE CHEST 1 VIEW COMPARISON:  02/02/2017  FINDINGS: Multifocal patchy opacities the bilateral upper and lower lobes, similar to the prior, although with mild progression in the left upper lobe. No pleural effusion or pneumothorax. The heart is normal in size IMPRESSION: Multifocal pneumonia, with mild progression in the left upper lobe. Electronically Signed   By: Charline BillsSriyesh  Krishnan M.D.   On: 02/15/2017 11:26   Tora KindredJohn Demica Zook, DO  02/17/2017

## 2017-02-17 NOTE — Consult Note (Signed)
ANTICOAGULATION CONSULT NOTE -  Pharmacy Consult for Warfarin dosing and monitoring  Indication: atrial fibrillation  Allergies  Allergen Reactions  . Ativan [Lorazepam] Anaphylaxis  . Aspirin Other (See Comments)    CAUSES ULCERS  . Codeine Nausea And Vomiting  . Oxycodone Nausea And Vomiting   Patient Measurements: Height: 5\' 9"  (175.3 cm) Weight: 139 lb 1.8 oz (63.1 kg) IBW/kg (Calculated) : 70.7  Vital Signs: Temp: 97.7 F (36.5 C) (02/10 0400) Temp Source: Oral (02/10 0400) BP: 98/46 (02/10 0645) Pulse Rate: 83 (02/10 0645)  Labs: Recent Labs    02/15/17 1110 02/15/17 2139 02/16/17 0542 02/16/17 0700 02/16/17 1632 02/17/17 0508  HGB 12.4*  --   --  10.2* 9.9*  --   HCT 36.2*  --   --  30.7* 29.7*  --   PLT 588*  --   --  430 329  --   LABPROT  --  14.0 14.7  --   --  14.4  INR  --  1.09 1.16  --   --  1.13  CREATININE 1.45*  --   --   --  1.23 1.02  1.10  TROPONINI <0.03  --   --   --  <0.03  --     Estimated Creatinine Clearance: 45.4 mL/min (by C-G formula based on SCr of 1.1 mg/dL).   Medical History: Past Medical History:  Diagnosis Date  . Back pain   . COPD (chronic obstructive pulmonary disease) (HCC)   . Depression   . Fatigue    a. in setting of Afib, improved following DCCV.  [name]. Hearing loss   . History of GI bleed 1976  . Hyperlipemia   . Hypertension   . Migraine   . Palpitations   . Persistent atrial fibrillation (HCC)    a. 02/2016 Echo: EF 66-65%, mild MR;  b. CHA2DS2VASc = 3-->coumadin;  c. 05/2016 TEE/DCCV: EF 45-50%, triv AI, mild to mod MR, no LA/LAA/RA/RAA thrombus, mod to sev TR-->successful DCCV.  . Seizure (HCC)   . Syncope    a. related to seizure disorder    Assessment: Pharmacy consulted for warfarin dosing and monitoring in 84 yo male with PMH of A. Fib. [facility] 2.5[facility]  daily. INR on admission was subtherapeutic at 1.09.  DATE INR DOSE 2/8 1.09 2.5mg  2/9 1.16 Dose NOT  given- pt too lethargic for oral meds 2/10 1.13   Goal of Therapy:  INR 2-3 Monitor platelets by anticoagulation protocol: Yes   Plan:   Patient did not receive Dose last night 2/9-per eMAR. Will give Warfarin 4mg  x 1 dose tonight.   Patient is on enoxaparin 40mg  daily. Will continue until INR is therapeutic.  Patient is also receiving antibiotics, which can increase INR. INRs daily until therapeutic and while on Abx.   Angelique BlonderMerrill,Mariela Rex A, PharmD, BCPS Clinical Pharmacist 02/17/2017 9:05 AM

## 2017-02-17 NOTE — Progress Notes (Signed)
Pharmacy Antibiotic Note  Zachary RavelSamuel D Peterson is a 84 y.o. male admitted on 02/15/2017 with multifocal PNA/HCAP/possible aspiration pneumonia.  Pharmacy has been consulted for vancomycin and cefepime dosing.  Patient received vancomycin 1000mg  once and zosyn 3.375g once in ED.   Plan: Will begin  Vancomycin 1000 IV every 24 hours.  Goal trough 15-20 mcg/mL.  Cefepime 2g Q12H due to CrCl of 33.4.   Pharmacy will continue to monitor and adjust as needed.   Ke=0.03 T1/2=21 hr Vd=46 Cmin 17 Stack dose @ 12 hours  2/10: Scr improved. Ke 0.045  T1/2 15.4.  Will adjust Vancomcyin to 1000 mg Q18h. Trough 2/21- prior to 4th dose of new regimen (will be 6th dose total on Day 5) Monitor renal fxn while on Zosyn/Vancomycin.   Height: 5\' 9"  (175.3 cm) Weight: 139 lb 1.8 oz (63.1 kg) IBW/kg (Calculated) : 70.7  Temp (24hrs), Avg:99.1 F (37.3 C), Min:97.5 F (36.4 C), Max:102.2 F (39 C)  Recent Labs  Lab 02/15/17 1110 02/15/17 1700 02/16/17 0700 02/16/17 1448 02/16/17 1632 02/17/17 0508  WBC 9.2  --  4.8  --  3.0*  --   CREATININE 1.45*  --   --   --  1.23 1.02  1.10  LATICACIDVEN 1.9 1.3  --  1.4 1.1  --     Estimated Creatinine Clearance: 45.4 mL/min (by C-G formula based on SCr of 1.1 mg/dL).    Allergies  Allergen Reactions  . Ativan [Lorazepam] Anaphylaxis  . Aspirin Other (See Comments)    CAUSES ULCERS  . Codeine Nausea And Vomiting  . Oxycodone Nausea And Vomiting    Antimicrobials this admission: Zosyn x1 Vancomycin 2/8 >> dc 2/9 Cefepime 2/9>> Zosyn 2/9 Vanc restart 2/9 >>  Dose adjustments this admission:   Microbiology results: BCx: 2/8 >> NGTD Sputum: 2/8 >> sent MRSA PCR neg.   Thank you for allowing pharmacy to be a part of this patient's care.  Yolanda BonineHannah Lifsey, PharmD Pharmacy Resident 02/17/2017 9:10 AM

## 2017-02-17 NOTE — Progress Notes (Signed)
Patient complained of feeing hungry and family was concerned he was "starving". Patient was able to eat one container of applesauce and tolerated. Did not cough and no s/s choking. Daughter and another family member wanted to know when patient will restart po medications and were told that since patient is no longer lethargic and able to talk and tolerated swallowing, he can resume his po medications crushed in applesauce today if he remains awake and alert.

## 2017-02-17 NOTE — Progress Notes (Signed)
sats on 6l were 96%, decreased to 3lnc

## 2017-02-17 NOTE — Progress Notes (Signed)
More alert today. Ate some of all 3 meals. Interactive with family and conversational all day. Napped briefly after breakfast. Fed himself dinner.

## 2017-02-18 DIAGNOSIS — E43 Unspecified severe protein-calorie malnutrition: Secondary | ICD-10-CM

## 2017-02-18 LAB — CBC
HEMATOCRIT: 27 % — AB (ref 40.0–52.0)
HEMOGLOBIN: 9.1 g/dL — AB (ref 13.0–18.0)
MCH: 31.4 pg (ref 26.0–34.0)
MCHC: 33.6 g/dL (ref 32.0–36.0)
MCV: 93.5 fL (ref 80.0–100.0)
Platelets: 330 10*3/uL (ref 150–440)
RBC: 2.89 MIL/uL — AB (ref 4.40–5.90)
RDW: 15.5 % — ABNORMAL HIGH (ref 11.5–14.5)
WBC: 3.9 10*3/uL (ref 3.8–10.6)

## 2017-02-18 LAB — BASIC METABOLIC PANEL
Anion gap: 8 (ref 5–15)
BUN: 15 mg/dL (ref 6–20)
CHLORIDE: 103 mmol/L (ref 101–111)
CO2: 24 mmol/L (ref 22–32)
Calcium: 7.6 mg/dL — ABNORMAL LOW (ref 8.9–10.3)
Creatinine, Ser: 1.04 mg/dL (ref 0.61–1.24)
GFR calc non Af Amer: >60 mL/min (ref >60)
Glucose, Bld: 189 mg/dL — ABNORMAL HIGH (ref 65–99)
POTASSIUM: 4.2 mmol/L (ref 3.5–5.1)
SODIUM: 135 mmol/L (ref 135–145)

## 2017-02-18 LAB — MAGNESIUM: Magnesium: 1.7 mg/dL (ref 1.7–2.4)

## 2017-02-18 LAB — PROTIME-INR
INR: 0.99
Prothrombin Time: 13 seconds (ref 11.4–15.2)

## 2017-02-18 LAB — PHOSPHORUS: Phosphorus: 1.6 mg/dL — ABNORMAL LOW (ref 2.5–4.6)

## 2017-02-18 MED ORDER — WARFARIN SODIUM 5 MG PO TABS
5.0000 mg | ORAL_TABLET | Freq: Once | ORAL | Status: AC
  Administered 2017-02-18: 5 mg via ORAL
  Filled 2017-02-18: qty 1

## 2017-02-18 MED ORDER — SENNOSIDES-DOCUSATE SODIUM 8.6-50 MG PO TABS
1.0000 | ORAL_TABLET | Freq: Two times a day (BID) | ORAL | Status: DC
  Administered 2017-02-18 – 2017-02-21 (×7): 1 via ORAL
  Filled 2017-02-18 (×7): qty 1

## 2017-02-18 NOTE — Evaluation (Addendum)
Clinical/Bedside Swallow Evaluation Patient Details  Name: Zachary Peterson MRN: [id number] Date of Birth: 11-06-1931  Today's Date: 02/18/2017 Time: SLP Start Time (ACUTE ONLY): 0930 SLP Stop Time (ACUTE ONLY): 1030 SLP Time Calculation (min) (ACUTE ONLY): 60 min  Past Medical History:  Past Medical History:  Diagnosis Date  . Back pain   . COPD (chronic obstructive pulmonary disease) (HCC)   . Depression   . Fatigue    a. in setting of Afib, improved following DCCV.  [name] Hearing loss   . History of GI bleed 1976  . Hyperlipemia   . Hypertension   . Migraine   . Palpitations   . Persistent atrial fibrillation (HCC)    a. 02/2016 Echo: EF 66-65%, mild MR;  b. CHA2DS2VASc = 3-->coumadin;  c. 05/2016 TEE/DCCV: EF 45-50%, triv AI, mild to mod MR, no LA/LAA/RA/RAA thrombus, mod to sev TR-->successful DCCV.  . Seizure (HCC)   . Syncope    a. related to seizure disorder   Past Surgical History:  Past Surgical History:  Procedure Laterality Date  . APPENDECTOMY    . BACK SURGERY    . CARDIOVERSION N/A 06/06/2016   Procedure: CARDIOVERSION;  Surgeon: Yvonne Kendall, MD;  Location: [facility] ORS;  Service: Cardiovascular;  Laterality: N/A;  . CATARACT EXTRACTION    . hemmoridectomy    . HERNIA REPAIR    . TEE WITHOUT CARDIOVERSION N/A 06/06/2016   Procedure: TRANSESOPHAGEAL ECHOCARDIOGRAM (TEE);  Surgeon: Yvonne Kendall, MD;  Location: [facility] ORS;  Service: Cardiovascular;  Laterality: N/A;   HPI:  Pt is a 84 y.o. male with a known history of multiple medical issues including COPD, seizures, fatigue, HTN, afib, and UE Tremors (hands) who recently discharged from [facility] for pneumonia January 21, currently in inpatient rehab, since emergency room for shortness of breath, tachycardia, noted hypoxia with O2 saturation in the 80s, diarrhea for the last 1-2 days, in the emergency room patient was tachycardic, tachypneic, hypoxic, patient evaluated in the emergency room, multiple family members  at the bedside, chest x-ray noted for multifocal pneumonia, patient is now being admitted for acute multifocal pneumonia-cannot rule out possible aspiration HCAP/given history of dysphagia. Pt was recently hospitalized at [facility] from 01/26/17-02/09/17. During that admission, pt had a Barium study and MBSS performed. Pt exhibited no aspiration of bolus material before/during the swallow but Retrograde bolus activity was noted from the Cervical Esophagus into the Pharynx. ENT/GI evaluations were recommended then. This presentation could greatly increase risk for aspiration of REFLUX material thus Pulmonary compromise from such. Chest CT in 2016 revealed Esophagus is decompressed, esophageal hiatal hernia. Head CT in 2018 revealed Moderate diffuse atrophy is stable. Unsure of pt's baseline Cognitive function. Pt was transferred to CCU post a Rapid Response day after admission d/t decline in medical and respiratory status' and blood pressure.    Assessment / Plan / Recommendation Clinical Impression  Pt appears to present w/ an adequate oropharyngeal phase swallow function w/ reduced risk for immediate aspiration from an oropharyngeal phase standpoint when following general aspiration precautions, and broken down food(lacking dentition). HOWEVER, it has been documented pt has Esophageal dysmotility w/ Retrgograde activity of bolus material to the Pharynx which, if to the level of the Pharynx, he is at HIGH risk for aspiration of the Reflux material thus Pulmonary compromise from such. There was concern of Reduced cricopharyngeal relaxation impeding bolus motility to the Esophagus. Pt consumed po trials of thin liquids, purees and soft solids w/ no immediate, overt s/s of aspiration noted.  Pt exhibited a delayed cough post trials of liquids during meal x2 but w/ no increase in frequency or intensity w/ ~6ozs of liquids total. Pt consumed foods w/ increased mastication time/effort w/ increased textured foods of Mech Soft -  pt is edentulous and does not wear the dentures per report. This overall presentation appears to go along w/ the MBSS performed in 01/2017 in which pt demonstrated Esophageal Dysmotility but no immediate aspiration w/ po trials given. Recommend f/u w/ GI for Esophageal Dysmotility; ENT as recommended d/t Reduced cricopharyngeal relaxation; CP prominence. Recommend a modified diet to Dysphagia level 3(mech soft w/ MINCED meats/foods as needed); thin liquids - no straws if coughing noted. General aspiration precautions; REFLUX precautions. Of note, pt MUST have assistance w/ feeding at this time d/t mod-severe TREMORS in his UEs/hands preventing him from feeding himself. Pt would benefit from a weighted utensil. This is baseline per pt "for many years".  SLP Visit Diagnosis: Dysphagia, pharyngoesophageal phase (R13.14)    Aspiration Risk  Mild aspiration risk;Moderate aspiration risk(from an pharyngeal-Esophageal standpoint)    Diet Recommendation  Dysphagia level 3/ w/ some level 2(MINCED) foods; Thin liquids. General aspiration precautions; Strict REFLUX precautions.   Medication Administration: Whole meds with puree but crushed if necessary for easier CP segment and Esophageal clearing   Other  Recommendations Recommended Consults: Consider esophageal assessment;Consider GI evaluation Oral Care Recommendations: Oral care BID;Staff/trained caregiver to provide oral care(UE tremors) Other Recommendations: (n/a)   Follow up Recommendations None      Frequency and Duration min 2x/week  1 week       Prognosis Prognosis for Safe Diet Advancement: Fair(-Good) Barriers to Reach Goals: Severity of deficits;Time post onset;Cognitive deficits      Swallow Study   General Date of Onset: 02/15/17 HPI: Pt is a 84 y.o. male with a known history of multiple medical issues including COPD, seizures, fatigue, HTN, afib, and UE Tremors (hands) who recently discharged from [facility] for pneumonia January  21, currently in inpatient rehab, since emergency room for shortness of breath, tachycardia, noted hypoxia with O2 saturation in the 80s, diarrhea for the last 1-2 days, in the emergency room patient was tachycardic, tachypneic, hypoxic, patient evaluated in the emergency room, multiple family members at the bedside, chest x-ray noted for multifocal pneumonia, patient is now being admitted for acute multifocal pneumonia-cannot rule out possible aspiration HCAP/given history of dysphagia. Pt was recently hospitalized at [facility] from 01/26/17-02/09/17. During that admission, pt had a Barium study and MBSS performed. Pt exhibited no aspiration of bolus material before/during the swallow but Retrograde bolus activity was noted from the Cervical Esophagus into the Pharynx. ENT/GI evaluations were recommended then. This presentation could greatly increase risk for aspiration of REFLUX material thus Pulmonary compromise from such. Chest CT in 2016 revealed Esophagus is decompressed, esophageal hiatal hernia. Head CT in 2018 revealed Moderate diffuse atrophy is stable. Unsure of pt's baseline Cognitive function. Pt was transferred to CCU post a Rapid Response day after admission d/t decline in medical and respiratory status' and blood pressure.  Type of Study: Bedside Swallow Evaluation Previous Swallow Assessment: yes(last admission last month 01/2017) Diet Prior to this Study: Dysphagia 3 (soft);Thin liquids(per MBSS in Jan 2019 ) Temperature Spikes Noted: No(wbc 3.9) Respiratory Status: Nasal cannula(3-4 liters) History of Recent Intubation: No Behavior/Cognition: Alert;Cooperative;Pleasant mood;Distractible;Requires cueing Oral Cavity Assessment: Within Functional Limits Oral Care Completed by SLP: Recent completion by staff Oral Cavity - Dentition: Edentulous(does not wear his dentures) Vision: Impaired for self-feeding(UE tremors)  Self-Feeding Abilities: Needs assist;Needs set up;Total assist Patient  Positioning: Upright in bed Baseline Vocal Quality: Normal Volitional Cough: Strong;Congested Volitional Swallow: Able to elicit    Oral/Motor/Sensory Function Overall Oral Motor/Sensory Function: Within functional limits   Ice Chips Ice chips: Within functional limits Presentation: Spoon(2 trials)   Thin Liquid Thin Liquid: Impaired Presentation: Cup;Self Fed(~6 ozs) Oral Phase Impairments: (none) Pharyngeal  Phase Impairments: Cough - Delayed(x2 but not immediate to trials; did not increase)    Nectar Thick Nectar Thick Liquid: Not tested   Honey Thick Honey Thick Liquid: Not tested   Puree Puree: Within functional limits Presentation: Spoon(fed; 4 ozs)   Solid   GO   Solid: Impaired Presentation: Spoon(fed; mech soft trials moistened) Oral Phase Impairments: Impaired mastication Oral Phase Functional Implications: Impaired mastication Pharyngeal Phase Impairments: (none) Other Comments: pt lacking dentition        Jerilynn SomKatherine Elsworth Ledin, MS, CCC-SLP Toney Lizaola 02/18/2017,4:13 PM

## 2017-02-18 NOTE — Consult Note (Signed)
 @ENCDATE @ 4:20 PM   Zachary RavelSamuel D Peterson [birth year] [id number]  Referring provider: No referring provider defined for this encounter.  Chief Complaint  Patient presents with  . Shortness of Breath    runny nose, Recent Dx viral Pneumonia, norovirus    HPI: Patient has a 3-4-week history of trouble to urinate.  He claims about 6 months his flow was good.  He does not get bladder infections.  He has not had previous GU surgery.  He has multiple comorbidities.  He was admitted with aspiration pneumonia and medical issues.  He now has a Foley catheter.  He was in a nursing facility where they were performing clean intermittent catheterization.  Apparently his family would like to resume the latter care unit they have to be taught how to perform it.  They also wondered if he could have definitive treatment while in the hospital for his retention  Modifying factors: There are no other modifying factors  Associated signs and symptoms: There are no other associated signs and symptoms Aggravating and relieving factors: There are no other aggravating or relieving factors Severity: Moderate Duration: Persistent   PMH: Past Medical History:  Diagnosis Date  . Back pain   . COPD (chronic obstructive pulmonary disease) (HCC)   . Depression   . Fatigue    a. in setting of Afib, improved following DCCV.  [name]. Hearing loss   . History of GI bleed 1976  . Hyperlipemia   . Hypertension   . Migraine   . Palpitations   . Persistent atrial fibrillation (HCC)    a. 02/2016 Echo: EF 66-65%, mild MR;  b. CHA2DS2VASc = 3-->coumadin;  c. 05/2016 TEE/DCCV: EF 45-50%, triv AI, mild to mod MR, no LA/LAA/RA/RAA thrombus, mod to sev TR-->successful DCCV.  . Seizure (HCC)   . Syncope    a. related to seizure disorder    Surgical History: Past Surgical History:  Procedure Laterality Date  . APPENDECTOMY    . BACK SURGERY    . CARDIOVERSION N/A 06/06/2016   Procedure: CARDIOVERSION;  Surgeon: Yvonne KendallEnd,  Christopher, MD;  Location: [facility] ORS;  Service: Cardiovascular;  Laterality: N/A;  . CATARACT EXTRACTION    . hemmoridectomy    . HERNIA REPAIR    . TEE WITHOUT CARDIOVERSION N/A 06/06/2016   Procedure: TRANSESOPHAGEAL ECHOCARDIOGRAM (TEE);  Surgeon: Yvonne KendallEnd, Christopher, MD;  Location: [facility] ORS;  Service: Cardiovascular;  Laterality: N/A;    Home Medications:  Medications reviewed  Allergies:  Allergies  Allergen Reactions  . Ativan [Lorazepam] Anaphylaxis  . Aspirin Other (See Comments)    CAUSES ULCERS  . Codeine Nausea And Vomiting  . Oxycodone Nausea And Vomiting    Family History: Family History  Problem Relation Age of Onset  . Heart disease Mother   . Hypertension Father     Social History:  reports that he has quit smoking. He has quit using smokeless tobacco. He reports that he does not drink alcohol or use drugs.  ROS:                                        Physical Exam: BP 117/60   Pulse (!) 104   Temp 98.7 F (37.1 C) (Axillary)   Resp 20   Ht 5\' 9"  (1.753 m)   Wt 139 lb 1.8 oz (63.1 kg)   SpO2 99%   BMI 20.54 kg/m   Constitutional:  Alert  and oriented, No acute distress. HEENT: [place] AT, moist mucus membranes.  Trachea midline, no masses. Cardiovascular: No clubbing, cyanosis, or edema. Respiratory: Normal respiratory effort, no increased work of breathing. GI: Abdomen is soft, nontender, nondistended, no abdominal masses GU: Nondistended bladder.  Male genitalia normal.  Foley catheter in place Skin: No rashes, bruises or suspicious lesions. Lymph: No cervical or inguinal adenopathy. Neurologic: Grossly intact, no focal deficits, moving all 4 extremities. Psychiatric: Normal mood and affect.  Laboratory Data: Lab Results  Component Value Date   WBC 3.9 02/18/2017   HGB 9.1 (L) 02/18/2017   HCT 27.0 (L) 02/18/2017   MCV 93.5 02/18/2017   PLT 330 02/18/2017    Lab Results  Component Value Date   CREATININE 1.04  02/18/2017    No results found for: PSA  No results found for: TESTOSTERONE  No results found for: HGBA1C  Urinalysis    Component Value Date/Time   COLORURINE YELLOW (A) 02/15/2017 1138   APPEARANCEUR HAZY (A) 02/15/2017 1138   LABSPEC 1.018 02/15/2017 1138   PHURINE 5.0 02/15/2017 1138   GLUCOSEU NEGATIVE 02/15/2017 1138   HGBUR MODERATE (A) 02/15/2017 1138   BILIRUBINUR NEGATIVE 02/15/2017 1138   KETONESUR NEGATIVE 02/15/2017 1138   PROTEINUR 30 (A) 02/15/2017 1138   UROBILINOGEN 0.2 05/17/2009 1257   NITRITE NEGATIVE 02/15/2017 1138   LEUKOCYTESUR SMALL (A) 02/15/2017 1138  None Pertinent Imaging: None  Assessment & Plan: The patient has retention.  Pathophysiology discussed.  The patient in the short-term should be managed with a Foley catheter or clean intermittent catheterization.  The patient is recommended to follow-up with the urology clinic here as an outpatient.  He is not a surgical candidate and likely in the future we would not perform urodynamics but this can be reassessed.  I recommend for the patient to start Flomax 0.4 mg daily with 30 tablets and 11 refills if the patient currently is not on it  @DIAGMED @  No Follow-up on file.  Martina Sinner, MD  [facility] [address], [address] [place], [place] [address] [phone]

## 2017-02-18 NOTE — Progress Notes (Signed)
Pharmacy Antibiotic Note  Zachary Peterson is a 84 y.o. male admitted on 02/15/2017 with multifocal PNA/HCAP/possible aspiration pneumonia.  Pharmacy has been consulted for Zosyn dosing.  Plan: Continue Zosyn EI 3.375g IV Q8hr.   Vancomycin discontinued during AM rounds on 2/11.    Height: 5\' 9"  (175.3 cm) Weight: 139 lb 1.8 oz (63.1 kg) IBW/kg (Calculated) : 70.7  Temp (24hrs), Avg:98.6 F (37 C), Min:98.4 F (36.9 C), Max:98.7 F (37.1 C)  Recent Labs  Lab 02/15/17 1110 02/15/17 1700 02/16/17 0700 02/16/17 1448 02/16/17 1632 02/17/17 0508 02/18/17 0445  WBC 9.2  --  4.8  --  3.0*  --  3.9  CREATININE 1.45*  --   --   --  1.23 1.02  1.10 1.04  LATICACIDVEN 1.9 1.3  --  1.4 1.1  --   --     Estimated Creatinine Clearance: 48 mL/min (by C-G formula based on SCr of 1.04 mg/dL).    Allergies  Allergen Reactions  . Ativan [Lorazepam] Anaphylaxis  . Aspirin Other (See Comments)    CAUSES ULCERS  . Codeine Nausea And Vomiting  . Oxycodone Nausea And Vomiting    Antimicrobials this admission: Zosyn 2/8  >>  Vancomycin 2/8 >> 2/11  Dose adjustments this admission:   Microbiology results: 2/8 BCx: no growth x 3 days  2/8 MRSA PCR: negative    Thank you for allowing pharmacy to be a part of this patient's care.  MLS 02/18/2017 4:56 PM

## 2017-02-18 NOTE — Plan of Care (Signed)
Patient alert and oriented.  Able to make needs known.  Medications given and tolerated.  Patient using bedside commode.  Remains on 4L oxygen via nasal cannula. Dyspnea noted on exertion.  Patient remains on antibiotics for pneumonia.  No acute distress noted. Will continue to monitor.

## 2017-02-18 NOTE — Consult Note (Signed)
ANTICOAGULATION CONSULT NOTE   Pharmacy Consult for Warfarin dosing and monitoring  Indication: atrial fibrillation  Allergies  Allergen Reactions  . Ativan [Lorazepam] Anaphylaxis  . Aspirin Other (See Comments)    CAUSES ULCERS  . Codeine Nausea And Vomiting  . Oxycodone Nausea And Vomiting   Patient Measurements: Height: 5\' 9"  (175.3 cm) Weight: 139 lb 1.8 oz (63.1 kg) IBW/kg (Calculated) : 70.7  Vital Signs: Temp: 98.7 F (37.1 C) (02/11 1600) Temp Source: Axillary (02/11 1600) BP: 120/60 (02/11 1600) Pulse Rate: 101 (02/11 1600)  Labs: Recent Labs    02/16/17 0542  02/16/17 0700 02/16/17 1632 02/17/17 0508 02/18/17 0445  HGB  --    < > 10.2* 9.9*  --  9.1*  HCT  --   --  30.7* 29.7*  --  27.0*  PLT  --   --  430 329  --  330  LABPROT 14.7  --   --   --  14.4 13.0  INR 1.16  --   --   --  1.13 0.99  CREATININE  --   --   --  1.23 1.02  1.10 1.04  TROPONINI  --   --   --  <0.03  --   --    < > = values in this interval not displayed.    Estimated Creatinine Clearance: 48 mL/min (by C-G formula based on SCr of 1.04 mg/dL).   Medical History: Past Medical History:  Diagnosis Date  . Back pain   . COPD (chronic obstructive pulmonary disease) (HCC)   . Depression   . Fatigue    a. in setting of Afib, improved following DCCV.  [name]. Hearing loss   . History of GI bleed 1976  . Hyperlipemia   . Hypertension   . Migraine   . Palpitations   . Persistent atrial fibrillation (HCC)    a. 02/2016 Echo: EF 66-65%, mild MR;  b. CHA2DS2VASc = 3-->coumadin;  c. 05/2016 TEE/DCCV: EF 45-50%, triv AI, mild to mod MR, no LA/LAA/RA/RAA thrombus, mod to sev TR-->successful DCCV.  . Seizure (HCC)   . Syncope    a. related to seizure disorder    Assessment: Pharmacy consulted for warfarin dosing and monitoring in 84 yo male with PMH of A. Fib. [facility]  [facility]. INR on admission was subtherapeutic at 1.09.  Goal of Therapy:   INR 2-3 Monitor platelets by anticoagulation protocol: Yes   Plan:   Patient received warfarin 4mg  x 1 on 2/10 - expect dosing to be reflected on INR on 2/12. Will order warfarin 5mg  x 1 and will recheck INR with am labs.   Pharmacy will continue to monitor and adjust per consult.   Davidlee Jeanbaptiste L, 02/18/2017 4:52 PM

## 2017-02-18 NOTE — Progress Notes (Signed)
Chaplain gave a follow up visit. Pt was being feed by wife and was alert and communicating. Ch will follow up with prayer later.   02/18/17 1100  Clinical Encounter Type  Visited With Patient and family together  Visit Type Spiritual support  Referral From Chaplain  Spiritual Encounters  Spiritual Needs Emotional  .

## 2017-02-18 NOTE — Plan of Care (Signed)
Patient alert. Sleeping in intervals.  Family at bedside.  No acute distress noted.  Patient continues on antibiotics for pneumonia. No seizure activity noted.  Able to make needs known. All meds given and tolerated. Will continue to monitor.

## 2017-02-19 ENCOUNTER — Telehealth: Payer: Self-pay | Admitting: Urology

## 2017-02-19 LAB — PROTIME-INR
INR: 1.15
PROTHROMBIN TIME: 14.6 s (ref 11.4–15.2)

## 2017-02-19 MED ORDER — GUAIFENESIN ER 600 MG PO TB12
600.0000 mg | ORAL_TABLET | Freq: Two times a day (BID) | ORAL | Status: DC
  Administered 2017-02-20 – 2017-02-21 (×4): 600 mg via ORAL
  Filled 2017-02-19 (×4): qty 1

## 2017-02-19 MED ORDER — SODIUM CHLORIDE 0.9 % IV SOLN
3.0000 g | Freq: Four times a day (QID) | INTRAVENOUS | Status: DC
  Administered 2017-02-19 – 2017-02-20 (×4): 3 g via INTRAVENOUS
  Filled 2017-02-19 (×7): qty 3

## 2017-02-19 MED ORDER — WARFARIN SODIUM 4 MG PO TABS
4.0000 mg | ORAL_TABLET | Freq: Once | ORAL | Status: AC
  Administered 2017-02-19: 19:00:00 4 mg via ORAL
  Filled 2017-02-19: qty 1

## 2017-02-19 NOTE — Evaluation (Signed)
Occupational Therapy Evaluation Patient Details Name: Zachary Peterson MRN: [id number] DOB: 08/10/1931 Today's Date: 02/19/2017    History of Present Illness 84 y.o. male admitted on 2/8 with HCAP and Afib. Pt with recent admission at [facility] with acute respiratory distress s/p seizure, intubated 01/26/17-01/28/17. Pt with significant PMH of dysphasia, syncope, seizure, a-fib s/p cardioversion, migraine, HTN, GIB, HOH, COPD, back pain s/p surgery, fatigue and depression.     Clinical Impression   Pt seen for OT evaluation this date, alert and oriented, eager to participate after breakfast. Pt with recent admission to [facility] and came to [facility] from STR. Prior to recent admission, pt was modified independent with SPC for mobility, indep with basic ADL, requiring assist for med mgt, cooking/cleaning/driving from spouse and daughter. Pt had some difficulty with self feeding due to hand tremors and reports having tried weighted utensils in the past but without much improvement. Pt currently presents with impaired strength, activity tolerance, cardiopulmonary status, and balance impairing his ability to perform functional mobility and ADL tasks, requiring 4L O2, min assist for bed mobility, min assist for sit to stand transfers with handheld assist, and stand pivot transfer with mod assist and verbal cues for hand placement and standing balance, set up and min guard to perform peri hygiene after BM with lateral lean to R side, and mod assist for LB ADL tasks. Pt O2 sats >94% throughout, improving with instruction and use of pursed lip breathing, HR 108, BP 136/67, O2 sats 94% on 4L at start of session, HR increasing to 113 during mobility with O2 sats 96%, and HR 100, BP 129/60, O2 sats 100% on 4L at end of session at rest. Pt will benefit from skilled OT services to address noted impairments and functional deficits in order to maximize return to PLOF and minimize risk of falls, functional decline, and need for higher level  of care or increased caregiver burden. Recommend pt return to STR to continue therapy upon discharge.     Follow Up Recommendations  SNF    Equipment Recommendations  3 in 1 bedside commode    Recommendations for Other Services       Precautions / Restrictions Precautions Precautions: Fall;Other (comment) Precaution Comments: monitor vitals/BP, seizure precautions Restrictions Weight Bearing Restrictions: No      Mobility Bed Mobility Overal bed mobility: Needs Assistance Bed Mobility: Supine to Sit;Sit to Supine     Supine to sit: Min assist;Min guard Sit to supine: Min guard;Min assist   General bed mobility comments: additional time/effort to perform, min assist for hip alignment to improve sitting balance  Transfers Overall transfer level: Needs assistance Equipment used: 1 person hand held assist Transfers: Sit to/from [employer] Sit to Stand: Min assist Stand pivot transfers: Mod assist       General transfer comment: VC for hand placement and improved posture    Balance Overall balance assessment: Needs assistance Sitting-balance support: Feet supported;Bilateral upper extremity supported Sitting balance-Leahy Scale: Fair     Standing balance support: Single extremity supported;During functional activity Standing balance-Leahy Scale: Poor                             ADL either performed or assessed with clinical judgement   ADL Overall ADL's : Needs assistance/impaired Eating/Feeding: Bed level;Set up Eating/Feeding Details (indicate cue type and reason): slow to eat/drink, on dysphagia III thin liquids diet Grooming: Wash/dry hands;Set up;Sitting;Supervision/safety   Upper Body Bathing: Minimal  assistance;Sitting   Lower Body Bathing: Moderate assistance;Sitting/lateral leans   Upper Body Dressing : Minimal assistance;Sitting   Lower Body Dressing: Sit to/from stand;Moderate assistance   Toilet Transfer:  BSC;Stand-pivot;Moderate assistance Toilet Transfer Details (indicate cue type and reason): VC for hand placement, handheld assist +1 and mod assist Toileting- Clothing Manipulation and Hygiene: Sitting/lateral lean;Set up;Supervision/safety               Vision Baseline Vision/History: Wears glasses Wears Glasses: At all times Patient Visual Report: No change from baseline       Perception     Praxis      Pertinent Vitals/Pain Pain Assessment: No/denies pain     Hand Dominance Right   Extremity/Trunk Assessment Upper Extremity Assessment Upper Extremity Assessment: Generalized weakness(bilat shoulder flexion 4-/5, all else 4/5 bilaterally)   Lower Extremity Assessment Lower Extremity Assessment: Generalized weakness(grossly at least 4/5 bilaterally)   Cervical / Trunk Assessment Cervical / Trunk Assessment: Kyphotic   Communication Communication Communication: HOH   Cognition Arousal/Alertness: Awake/alert Behavior During Therapy: WFL for tasks assessed/performed Overall Cognitive Status: Within Functional Limits for tasks assessed                                     General Comments       Exercises Other Exercises Other Exercises: Pt instructed in pursed lip breathing to minimize over exertion and recovery after functional mobility, with noted improvement in O2 sats with use (94% on 4L improving to 96-100% on 4L after PLB). Pt required verbal cues to utilize, as he tends to breathe through open mouth. Other Exercises: Pt instructed in BLE heel slides in preparation for bed mobility   Shoulder Instructions      Home Living Family/patient expects to be discharged to:: Skilled nursing facility                             Home Equipment: Gilmer Mor - single point;Shower seat   Additional Comments: Pt from STR, previously was in single family home with 24/7 assist from family, 4 STE home with handrails, tub/shower      Prior  Functioning/Environment Level of Independence: Needs assistance  Gait / Transfers Assistance Needed: Pt mod indep with SPC prior to recent admission ADL's / Homemaking Assistance Needed: Pt indep with ADL, spouse/daughter assist with med mgt, cooking, cleaning, and driving (pt hasn't driven in > 40yr)   Comments: Pt reports no significant falls hx.         OT Problem List: Decreased strength;Decreased knowledge of use of DME or AE;Cardiopulmonary status limiting activity;Decreased activity tolerance;Impaired balance (sitting and/or standing)      OT Treatment/Interventions: Self-care/ADL training;Balance training;Therapeutic exercise;Therapeutic activities;Energy conservation;DME and/or AE instruction;Patient/family education    OT Goals(Current goals can be found in the care plan section) Acute Rehab OT Goals Patient Stated Goal: to regain independence OT Goal Formulation: With patient Time For Goal Achievement: 03/05/17 Potential to Achieve Goals: Good ADL Goals Pt Will Perform Lower Body Dressing: sit to/from stand;with supervision;with adaptive equipment(LRAD as needed) Pt Will Transfer to Toilet: with min guard assist;ambulating(comfort height toilet, LRAD for ambulation) Additional ADL Goal #1: Pt will utilize pursed lip breathing and other learned ECS during functional mobility and ADL tasks with no verbal cues and O2 sats >95%, 5/5 opportunities.  OT Frequency: Min 2X/week   Barriers to D/C:  Co-evaluation              AM-PAC PT "6 Clicks" Daily Activity     Outcome Measure Help from another person eating meals?: A Little Help from another person taking care of personal grooming?: None Help from another person toileting, which includes using toliet, bedpan, or urinal?: A Little Help from another person bathing (including washing, rinsing, drying)?: A Lot Help from another person to put on and taking off regular upper body clothing?: A Little Help from  another person to put on and taking off regular lower body clothing?: A Lot 6 Click Score: 17   End of Session Equipment Utilized During Treatment: Oxygen(4L O2)  Activity Tolerance: Patient tolerated treatment well Patient left: in bed;with call bell/phone within reach;with bed alarm set;with SCD's reapplied  OT Visit Diagnosis: Other abnormalities of gait and mobility (R26.89);Muscle weakness (generalized) (M62.81)                Time: [id number] OT Time Calculation (min): 70 min Charges:  OT General Charges $OT Visit: 1 Visit OT Evaluation $OT Eval Low Complexity: 1 Low OT Treatments $Self Care/Home Management : 23-37 mins $Therapeutic Exercise: 8-22 mins  Richrd PrimeJamie Stiller, MPH, MS, OTR/L ascom [phone] 02/19/17, 10:48 AM

## 2017-02-19 NOTE — Consult Note (Addendum)
ANTICOAGULATION CONSULT NOTE   Pharmacy Consult for Warfarin dosing and monitoring  Indication: atrial fibrillation  Allergies  Allergen Reactions  . Ativan [Lorazepam] Anaphylaxis  . Aspirin Other (See Comments)    CAUSES ULCERS  . Codeine Nausea And Vomiting  . Oxycodone Nausea And Vomiting   Patient Measurements: Height: 5\' 9"  (175.3 cm) Weight: 139 lb 1.8 oz (63.1 kg) IBW/kg (Calculated) : 70.7  Vital Signs: Temp: 97.6 F (36.4 C) (02/12 0800) Temp Source: Oral (02/12 0800) BP: 137/67 (02/12 1100) Pulse Rate: 97 (02/12 1100)  Labs: Recent Labs    02/16/17 1632 02/17/17 0508 02/18/17 0445 02/19/17 0539  HGB 9.9*  --  9.1*  --   HCT 29.7*  --  27.0*  --   PLT 329  --  330  --   LABPROT  --  14.4 13.0 14.6  INR  --  1.13 0.99 1.15  CREATININE 1.23 1.02  1.10 1.04  --   TROPONINI <0.03  --   --   --     Estimated Creatinine Clearance: 48 mL/min (by C-G formula based on SCr of 1.04 mg/dL).   Medical History: Past Medical History:  Diagnosis Date  . Back pain   . COPD (chronic obstructive pulmonary disease) (HCC)   . Depression   . Fatigue    a. in setting of Afib, improved following DCCV.  [name]. Hearing loss   . History of GI bleed 1976  . Hyperlipemia   . Hypertension   . Migraine   . Palpitations   . Persistent atrial fibrillation (HCC)    a. 02/2016 Echo: EF 66-65%, mild MR;  b. CHA2DS2VASc = 3-->coumadin;  c. 05/2016 TEE/DCCV: EF 45-50%, triv AI, mild to mod MR, no LA/LAA/RA/RAA thrombus, mod to sev TR-->successful DCCV.  . Seizure (HCC)   . Syncope    a. related to seizure disorder    Assessment: Pharmacy consulted for warfarin dosing and monitoring in 84 yo male with PMH of A. Fib. [facility] 2.5mg  daily. INR on admission was subtherapeutic at 1.09.  DATE INR DOSE 2/10 1.13 4mg  2/11 0.99 5mg  2/12 1.15  Goal of Therapy:  INR 2-3 Monitor platelets by anticoagulation protocol: Yes   Plan:   INR still  subtherapeutic. Expect to see increase in INR on 2/13. Patient also receiving Abx which can increase INR.  Will order warfarin 4mg  x 1 tonight and will recheck INR with am labs.   Pharmacy will continue to monitor and adjust per consult.   Gardner CandleSheema M Orean Giarratano, PharmD, BCPS Clinical Pharmacist 02/19/2017 11:55 AM

## 2017-02-19 NOTE — Telephone Encounter (Signed)
Spoke with pt wife in reference to pt having an outpatient f/u. Wife stated that she will call once pt is d/c from hospital.

## 2017-02-19 NOTE — Progress Notes (Signed)
[facility] - [place] at [facility]   PATIENT NAME: Zachary Peterson    MR#:  [id number]  DATE OF BIRTH:  1931-10-26  SUBJECTIVE:  CHIEF COMPLAINT:   Chief Complaint  Patient presents with  . Shortness of Breath    runny nose, Recent Dx viral Pneumonia, norovirus   Still has SOB. Awake. Sats dropped and O2 increased to 4 L  REVIEW OF SYSTEMS:    Review of Systems  Unable to perform ROS: Mental status change    DRUG ALLERGIES:   Allergies  Allergen Reactions  . Ativan [Lorazepam] Anaphylaxis  . Aspirin Other (See Comments)    CAUSES ULCERS  . Codeine Nausea And Vomiting  . Oxycodone Nausea And Vomiting    VITALS:  Blood pressure 137/67, pulse 97, temperature 97.6 F (36.4 C), temperature source Oral, resp. rate (!) 23, height 5\' 9"  (1.753 m), weight 63.1 kg (139 lb 1.8 oz), SpO2 97 %.  PHYSICAL EXAMINATION:   Physical Exam  GENERAL:  84 y.o.-year-old patient lying in the bed with mild respiratory distress EYES: Pupils equal, round, reactive to light and accommodation. No scleral icterus. Extraocular muscles intact.  HEENT: Head atraumatic, normocephalic. Oropharynx and nasopharynx clear.  NECK:  Supple, no jugular venous distention. No thyroid enlargement, no tenderness.  LUNGS: Bilateral wheezing and decreased air entry CARDIOVASCULAR: S1, S2 normal. No murmurs, rubs, or gallops.  ABDOMEN: Soft, nontender, nondistended. Bowel sounds present. No organomegaly or mass.  EXTREMITIES: No cyanosis, clubbing or edema b/l.    NEUROLOGIC: Moving all 4 extremities PSYCHIATRIC: The patient is awake SKIN: No obvious rash, lesion, or ulcer.   LABORATORY PANEL:   CBC Recent Labs  Lab 02/18/17 0445  WBC 3.9  HGB 9.1*  HCT 27.0*  PLT 330   ------------------------------------------------------------------------------------------------------------------ Chemistries  Recent Labs  Lab 02/15/17 1110  02/18/17 0445  NA 136   < > 135  K 4.4   < > 4.2   CL 101   < > 103  CO2 25   < > 24  GLUCOSE 140*   < > 189*  BUN 23*   < > 15  CREATININE 1.45*   < > 1.04  CALCIUM 8.8*   < > 7.6*  MG  --    < > 1.7  AST 30  --   --   ALT 16*  --   --   ALKPHOS 100  --   --   BILITOT 0.5  --   --    < > = values in this interval not displayed.   ------------------------------------------------------------------------------------------------------------------  Cardiac Enzymes Recent Labs  Lab 02/16/17 1632  TROPONINI <0.03   ------------------------------------------------------------------------------------------------------------------  RADIOLOGY:  No results found.   ASSESSMENT AND PLAN:   * Multifocal healthcare acquired pneumonia vs aspiration PNA MRSA PCR negative.  Stopped vancomycin. On IV Zosyn Culture NGTD Still has SOB and on 4 L O2.  *Acute COPD exacerbation.  Nebulizers. steroids.  * paroxysmal atrial fibrillation.  On beta-blocker therapy.  Coumadin.  * Severe protein calorie malnutrition.  * Acute diarrhea. Resolved. C diff ordered but no further diarrhea.  * Acute on chronic hypoxic respiratory failure due to pneumonia.  Wean oxygen as tolerated.  Nebulizers  * Acute toxic and metabolic encephalopathy. Resolved.  * CKD stage III is stable.  * Anemia of chronic disease  * DVT prophylaxis.  On Coumadin.  All the records are reviewed and case discussed with Care Management/Social Worker Management plans discussed with the patient, family and they  are in agreement.  CODE STATUS: DNR  DVT Prophylaxis: SCDs  TOTAL TIME TAKING CARE OF THIS PATIENT: 35 minutes.   POSSIBLE D/C IN 2-3 DAYS, DEPENDING ON CLINICAL CONDITION.  Orie FishermanSrikar R Aine Strycharz M.D on 02/19/2017 at 12:06 PM  Between 7am to 6pm - Pager - [phone]  After 6pm go to [email or web address] - password [email or web address]  [facility] [place] [facility]  Office  [phone]  CC: Primary care physician; Ignacia PalmaBeck, Mark C., MD  Note: This dictation was prepared  with Dragon dictation along with smaller phrase technology. Any transcriptional errors that result from this process are unintentional.

## 2017-02-19 NOTE — Progress Notes (Signed)
[facility] - [place] at [facility]   PATIENT NAME: Leanora IvanoffSamuel Rothgeb    MR#:  [id number]  DATE OF BIRTH:  Jul 03, 1931  SUBJECTIVE:  CHIEF COMPLAINT:   Chief Complaint  Patient presents with  . Shortness of Breath    runny nose, Recent Dx viral Pneumonia, norovirus   SOB present. Afebrile  REVIEW OF SYSTEMS:    Review of Systems  Unable to perform ROS: Mental status change    DRUG ALLERGIES:   Allergies  Allergen Reactions  . Ativan [Lorazepam] Anaphylaxis  . Aspirin Other (See Comments)    CAUSES ULCERS  . Codeine Nausea And Vomiting  . Oxycodone Nausea And Vomiting    VITALS:  Blood pressure 137/67, pulse 97, temperature 97.6 F (36.4 C), temperature source Oral, resp. rate (!) 23, height 5\' 9"  (1.753 m), weight 63.1 kg (139 lb 1.8 oz), SpO2 97 %.  PHYSICAL EXAMINATION:   Physical Exam  GENERAL:  84 y.o.-year-old patient lying in the bed with mild respiratory distress EYES: Pupils equal, round, reactive to light and accommodation. No scleral icterus. Extraocular muscles intact.  HEENT: Head atraumatic, normocephalic. Oropharynx and nasopharynx clear.  NECK:  Supple, no jugular venous distention. No thyroid enlargement, no tenderness.  LUNGS: Bilateral wheezing and decreased air entry CARDIOVASCULAR: S1, S2 normal. No murmurs, rubs, or gallops.  ABDOMEN: Soft, nontender, nondistended. Bowel sounds present. No organomegaly or mass.  EXTREMITIES: No cyanosis, clubbing or edema b/l.    NEUROLOGIC: Moving all 4 extremities PSYCHIATRIC: The patient is alert and awake.  SKIN: No obvious rash, lesion, or ulcer.   LABORATORY PANEL:   CBC Recent Labs  Lab 02/18/17 0445  WBC 3.9  HGB 9.1*  HCT 27.0*  PLT 330   ------------------------------------------------------------------------------------------------------------------ Chemistries  Recent Labs  Lab 02/15/17 1110  02/18/17 0445  NA 136   < > 135  K 4.4   < > 4.2  CL 101   < > 103  CO2 25    < > 24  GLUCOSE 140*   < > 189*  BUN 23*   < > 15  CREATININE 1.45*   < > 1.04  CALCIUM 8.8*   < > 7.6*  MG  --    < > 1.7  AST 30  --   --   ALT 16*  --   --   ALKPHOS 100  --   --   BILITOT 0.5  --   --    < > = values in this interval not displayed.   ------------------------------------------------------------------------------------------------------------------  Cardiac Enzymes Recent Labs  Lab 02/16/17 1632  TROPONINI <0.03   ------------------------------------------------------------------------------------------------------------------  RADIOLOGY:  No results found.   ASSESSMENT AND PLAN:   * Multifocal healthcare acquired pneumonia vs aspiration PNA MRSA PCR negative.  Stopped vancomycin. On IV Zosyn Culture NGTD Slowly improving  *Acute COPD exacerbation.  Nebulizers. steroids.  * paroxysmal atrial fibrillation.  On beta-blocker therapy.  Coumadin.  * Severe protein calorie malnutrition.  Dietitian consult.  * Acute diarrhea. Resolved. C diff ordered but no further diarrhea.  * Acute on chronic hypoxic respiratory failure due to pneumonia.  Wean oxygen as tolerated.  Nebulizers  * Acute toxic and metabolic encephalopathy. Resolved.  * CKD stage III is stable.  * Anemia of chronic disease  * DVT prophylaxis.  On Coumadin.  All the records are reviewed and case discussed with Care Management/Social Worker Management plans discussed with the patient, family and they are in agreement.  CODE STATUS: DNR  DVT  Prophylaxis: SCDs  TOTAL TIME TAKING CARE OF THIS PATIENT: 35 minutes.   POSSIBLE D/C IN 2-3 DAYS, DEPENDING ON CLINICAL CONDITION.  Orie Fisherman M.D on 02/19/2017 at 12:05 PM  Between 7am to 6pm - Pager - [phone]  After 6pm go to [email or web address] - password [email or web address]  [facility] [place] [facility]  Office  [phone]  CC: Primary care physician; Ignacia Palma., MD  Note: This dictation was prepared with Dragon dictation  along with smaller phrase technology. Any transcriptional errors that result from this process are unintentional.

## 2017-02-19 NOTE — Progress Notes (Signed)
Pt [facility] to 1C, [facility] no supp O[facility] needed, pt will transfer in his recliner, no fluids needed, chart meds and belongings will transfer with him

## 2017-02-19 NOTE — Evaluation (Signed)
Physical Therapy Evaluation Patient Details Name: Zachary Peterson MRN: [id number] DOB: 1931-09-21 Today's Date: 02/19/2017   History of Present Illness  84 y.o. male admitted on 2/8 with HCAP and Afib. After admission to the floor the pt presented with hypotension and was transferred to the ICU.  Following improvement a new PT order was received.  Pt with recent admission at [facility] with acute respiratory distress s/p seizure, intubated 01/26/17-01/28/17. Pt with significant PMH of dysphasia, syncope, seizure, a-fib s/p cardioversion, migraine, HTN, GIB, HOH, COPD, back pain s/p surgery, fatigue and depression.      Clinical Impression  Pt admitted with above diagnosis. Pt currently with functional limitations due to the deficits listed below (see PT Problem List). Pt mod indep with SPC prior to recent admission.  While at SNF he ambulated very limited distances in room with RW.  Had just begun PT at [facility] before admitted to this hospital.  Pt current requires min assist for transfers and ambulation.  Distance limited by fatigue and hypoxia.  SpO2 initially in the low 90s on 4L O2 and dropping to 86% on 4L O2.  Ambulation stopped due to this and RN bumped O2 up to 6L O2 with improvement after several minutes of sitting to low 90s on 4L.  Cues for pursed lip breathing throughout all activity this session.  Given pt's current mobility status, recommending SNF at d/c.  Pt will benefit from skilled PT to increase their independence and safety with mobility to allow discharge to the venue listed below.      Follow Up Recommendations SNF    Equipment Recommendations  Other (comment)(TBD at next venue of care)    Recommendations for Other Services       Precautions / Restrictions Precautions Precautions: Fall;Other (comment) Precaution Comments: monitor vitals/BP, seizure precautions Restrictions Weight Bearing Restrictions: No      Mobility  Bed Mobility Overal bed mobility: Needs Assistance Bed  Mobility: Supine to Sit     Supine to sit: Min guard     General bed mobility comments: Increased time and effort with min verbal cues.  Close min guard assist as pt unsteady with poor trunk control due to significant kyphosis.   Transfers Overall transfer level: Needs assistance Equipment used: Rolling walker (2 wheeled) Transfers: Sit to/from Stand Sit to Stand: Min assist         General transfer comment: Pt slow to stand and cues for hand placement with mild unsteadiness but no LOB.  Cues for hand placement to reach back for armrests when preparing to sit.   Ambulation/Gait Ambulation/Gait assistance: Min assist;+2 safety/equipment Ambulation Distance (Feet): 60 Feet Assistive device: Rolling walker (2 wheeled) Gait Pattern/deviations: Step-through pattern;Decreased stride length;Trunk flexed Gait velocity: decreased Gait velocity interpretation: Below normal speed for age/gender General Gait Details: Pt with decreased gait speed and flexed posture pushing RW too far ahead.  Cues for upright posture and forward gaze which improved but was difficult to maintain.  SpO2 initially low 90s on 4L O2 and dropping to 86% on 4L O2.  Ambulation stopped due to this and RN bumped O2 up to 6L O2 with improvement after several minutes of sitting to low 90s on 4L.  Cues for pursed lip breathing throughout all activity this session.   Stairs            Wheelchair Mobility    Modified Rankin (Stroke Patients Only)       Balance Overall balance assessment: Needs assistance Sitting-balance support: No upper extremity  supported;Feet supported Sitting balance-Leahy Scale: Fair Sitting balance - Comments: Fatigue after several several minutes of sitting but pt able to maintain balance sitting EOB.    Standing balance support: Bilateral upper extremity supported;During functional activity Standing balance-Leahy Scale: Poor Standing balance comment: Pt relies on UE support to maintain  balance with static and dynamic activities                             Pertinent Vitals/Pain Pain Assessment: No/denies pain    Home Living Family/patient expects to be discharged to:: Skilled nursing facility Living Arrangements: Spouse/significant other Available Help at Discharge: Family;Available 24 hours/day Type of Home: House Home Access: Stairs to enter Entrance Stairs-Rails: Right;Left;Can reach both Entrance Stairs-Number of Steps: 4 Home Layout: One level Home Equipment: Cane - single point;Shower seat Additional Comments: [facility] and [facility]    Prior Function Level of Independence: Needs assistance   Gait / Transfers Assistance Needed: Pt mod indep with SPC prior to recent admission.  While at SNF he ambulated very limited distances in room with RW.  Had just begun PT.   ADL's / Homemaking Assistance Needed: [facility], indep with ADL, spouse/daughter assist with med mgt, cooking, cleaning, and driving (pt hasn't driven in > 2928yr)  Comments: Pt reports no significant falls hx.      Hand Dominance   Dominant Hand: Right    Extremity/Trunk Assessment   Upper Extremity Assessment Upper Extremity Assessment: Defer to OT evaluation    Lower Extremity Assessment Lower Extremity Assessment: (Strength grossly 4/5 BLE)    Cervical / Trunk Assessment Cervical / Trunk Assessment: Kyphotic(significantly kyphotic, effecting his breathing)  Communication   Communication: HOH  Cognition Arousal/Alertness: Awake/alert Behavior During Therapy: WFL for tasks assessed/performed Overall Cognitive Status: Within Functional Limits for tasks assessed                                        General Comments General comments (skin integrity, edema, etc.): BP stable throughout session.  HR up to 106 with ambulation.     Exercises General Exercises - Lower Extremity Ankle Circles/Pumps:  AROM;Both;10 reps;Supine Quad Sets: Strengthening;Both;10 reps;Supine Straight Leg Raises: AROM;Both;5 reps;Supine Other Exercises Other Exercises: Instructed pt in pursed lip breathing and proper posture to be practiced throughout day.    Assessment/Plan    PT Assessment Patient needs continued PT services  PT Problem List Decreased strength;Decreased activity tolerance;Decreased balance;Decreased knowledge of use of DME;Cardiopulmonary status limiting activity;Decreased safety awareness       PT Treatment Interventions DME instruction;Gait training;Stair training;Functional mobility training;Therapeutic activities;Therapeutic exercise;Balance training;Neuromuscular re-education;Patient/family education    PT Goals (Current goals can be found in the Care Plan section)  Acute Rehab PT Goals Patient Stated Goal: to regain independence PT Goal Formulation: With patient Time For Goal Achievement: 03/05/17 Potential to Achieve Goals: Good    Frequency Min 2X/week   Barriers to discharge        Co-evaluation               AM-PAC PT "6 Clicks" Daily Activity  Outcome Measure Difficulty turning over in bed (including adjusting bedclothes, sheets and blankets)?: A Lot Difficulty moving from lying on back to sitting on the side of the bed? : A Lot Difficulty sitting down on and standing  up from a chair with arms (e.g., wheelchair, bedside commode, etc,.)?: Unable Help needed moving to and from a bed to chair (including a wheelchair)?: A Little Help needed walking in hospital room?: A Little Help needed climbing 3-5 steps with a railing? : A Lot 6 Click Score: 13    End of Session Equipment Utilized During Treatment: Gait belt;Oxygen Activity Tolerance: Patient tolerated treatment well;Treatment limited secondary to medical complications (Comment);Patient limited by fatigue(hypoxia) Patient left: in chair;with call bell/phone within reach;with family/visitor present(RN left  alarm off as family in rom) Nurse Communication: Mobility status;Other (comment)(SpO2; RN present during session starting at ambulation) PT Visit Diagnosis: Muscle weakness (generalized) (M62.81);Unsteadiness on feet (R26.81);Other abnormalities of gait and mobility (R26.89);Difficulty in walking, not elsewhere classified (R26.2)    Time: 1610-9604 PT Time Calculation (min) (ACUTE ONLY): 40 min   Charges:   PT Evaluation $PT Eval Moderate Complexity: 1 Mod PT Treatments $Gait Training: 8-22 mins $Therapeutic Activity: 8-22 mins   PT G Codes:        Encarnacion Chu PT, DPT 02/19/2017, 3:00 PM

## 2017-02-19 NOTE — Telephone Encounter (Signed)
Would you make sure this patient has outpatient follow up for urinary retention?

## 2017-02-19 NOTE — Progress Notes (Signed)
[facility] -  at [facility]   PATIENT NAME: Zachary Peterson    MR#:  [id number]  DATE OF BIRTH:  [birth year]  SUBJECTIVE:  CHIEF COMPLAINT:   Chief Complaint  Patient presents with  . Shortness of Breath    runny nose, Recent Dx viral Pneumonia, norovirus   Patient is feeling weak.  Drowsy. 4 L O2  REVIEW OF SYSTEMS:    Review of Systems  Unable to perform ROS: Mental status change    DRUG ALLERGIES:   Allergies  Allergen Reactions  . Ativan [Lorazepam] Anaphylaxis  . Aspirin Other (See Comments)    CAUSES ULCERS  . Codeine Nausea And Vomiting  . Oxycodone Nausea And Vomiting    VITALS:  Blood pressure 137/67, pulse 97, temperature 97.6 F (36.4 C), temperature source Oral, resp. rate (!) 23, height 5\' 9"  (1.753 m), weight 63.1 kg (139 lb 1.8 oz), SpO2 97 %.  PHYSICAL EXAMINATION:   Physical Exam  GENERAL:  84 y.o.-year-old patient lying in the bed with mild respiratory distress EYES: Pupils equal, round, reactive to light and accommodation. No scleral icterus. Extraocular muscles intact.  HEENT: Head atraumatic, normocephalic. Oropharynx and nasopharynx clear.  NECK:  Supple, no jugular venous distention. No thyroid enlargement, no tenderness.  LUNGS: Bilateral wheezing and decreased air entry CARDIOVASCULAR: S1, S2 normal. No murmurs, rubs, or gallops.  ABDOMEN: Soft, nontender, nondistended. Bowel sounds present. No organomegaly or mass.  EXTREMITIES: No cyanosis, clubbing or edema b/l.    NEUROLOGIC: Moving all 4 extremities PSYCHIATRIC: The patient is alert and awake.  SKIN: No obvious rash, lesion, or ulcer.   LABORATORY PANEL:   CBC Recent Labs  Lab 02/18/17 0445  WBC 3.9  HGB 9.1*  HCT 27.0*  PLT 330   ------------------------------------------------------------------------------------------------------------------ Chemistries  Recent Labs  Lab 02/15/17 1110  02/18/17 0445  NA 136   < > 135  K 4.4   < > 4.2  CL 101    < > 103  CO2 25   < > 24  GLUCOSE 140*   < > 189*  BUN 23*   < > 15  CREATININE 1.45*   < > 1.04  CALCIUM 8.8*   < > 7.6*  MG  --    < > 1.7  AST 30  --   --   ALT 16*  --   --   ALKPHOS 100  --   --   BILITOT 0.5  --   --    < > = values in this interval not displayed.   ------------------------------------------------------------------------------------------------------------------  Cardiac Enzymes Recent Labs  Lab 02/16/17 1632  TROPONINI <0.03   ------------------------------------------------------------------------------------------------------------------  RADIOLOGY:  No results found.   ASSESSMENT AND PLAN:   *Multifocal healthcare acquired pneumonia .  Also at risk for aspiration pneumonia Patient recently at [facility] and treated for pneumonia and sent to rehab. MRSA PCR negative.  Stopped vancomycin. Changed cefepime to IV Zosyn due to risk of aspiration. Culture NGTD  *Acute COPD exacerbation.  Nebulizers. steroids.  *paroxysmal atrial fibrillation.  On beta-blocker therapy.  Coumadin.  *Severe protein calorie malnutrition.  Dietitian consult.  *Acute diarrhea. Resolved. C diff ordered but no further diarrhea.  *Acute on chronic hypoxic respiratory failure due to pneumonia.  Wean oxygen as tolerated.  Nebulizers as needed.  *Acute toxic and metabolic encephalopathy. Resolved.  *CKD stage III is stable.  *Anemia of chronic disease  *DVT prophylaxis.  On Coumadin.  All the records are reviewed and case  discussed with Care Management/Social Worker Management plans discussed with the patient, family and they are in agreement.  CODE STATUS: Partial code.  Okay to intubate.  DVT Prophylaxis: SCDs  TOTAL TIME TAKING CARE OF THIS PATIENT: 35 minutes.   POSSIBLE D/C IN 2-3 DAYS, DEPENDING ON CLINICAL CONDITION.  Orie FishermanSrikar R Normajean Nash M.D on 02/19/2017 at 12:01 PM  Between 7am to 6pm - Pager - [phone]  After 6pm go to [email or web address] -  password [email or web address]  [facility] [place] [facility]  Office  [phone]  CC: Primary care physician; Ignacia PalmaBeck, Mark C., MD  Note: This dictation was prepared with Dragon dictation along with smaller phrase technology. Any transcriptional errors that result from this process are unintentional.

## 2017-02-20 ENCOUNTER — Telehealth: Payer: Self-pay | Admitting: Urology

## 2017-02-20 LAB — CULTURE, BLOOD (ROUTINE X 2)
CULTURE: NO GROWTH
Culture: NO GROWTH
SPECIAL REQUESTS: ADEQUATE
SPECIMEN DESCRIPTION: ADEQUATE

## 2017-02-20 LAB — PROTIME-INR
INR: 1.28
Prothrombin Time: 15.9 seconds — ABNORMAL HIGH (ref 11.4–15.2)

## 2017-02-20 MED ORDER — DIAZEPAM 5 MG PO TABS
5.0000 mg | ORAL_TABLET | Freq: Two times a day (BID) | ORAL | Status: DC
  Administered 2017-02-21 – 2017-02-22 (×4): 5 mg via ORAL
  Filled 2017-02-20 (×5): qty 1

## 2017-02-20 MED ORDER — WARFARIN SODIUM 4 MG PO TABS
4.0000 mg | ORAL_TABLET | Freq: Every day | ORAL | Status: DC
  Administered 2017-02-20: 19:00:00 4 mg via ORAL
  Filled 2017-02-20: qty 1

## 2017-02-20 MED ORDER — AMOXICILLIN-POT CLAVULANATE 875-125 MG PO TABS
1.0000 | ORAL_TABLET | Freq: Two times a day (BID) | ORAL | Status: DC
  Administered 2017-02-20 – 2017-02-21 (×3): 1 via ORAL
  Filled 2017-02-20 (×3): qty 1

## 2017-02-20 NOTE — Progress Notes (Addendum)
Had an extensive conversation over the phone with pt granddaughter who is an Charity fundraiserN in the ED @ [facility]. Granddaughter stated that her mother and aunt would be here in the morning to talk about D/C. Per granddaughter, they want their dad to come home with whatever resources insurance will pay for. Granddaughter would also like for urology to be consulted about removal of foley, if they feel he could possibly urinate on his own. She states he urinates sitting down, and that he may have had urinary retention in the past, but at this point may be able to urinate on his own. She was also concerned about his Valium dose being adjusted so many times. She stated that her grandfather did not do well on less than 5mg  and that he had been taking that dose for 20 or more years. I informed her that Dr. Elpidio AnisSudini had been made aware earlier and the dosage was already changed back to his regular dosage. Granddaughter would also like MD to speak with family about prognosis and palliative care. Case Manager Steward DroneBrenda and Dr. Elpidio AnisSudini made aware.

## 2017-02-20 NOTE — Care Management (Signed)
Daughter Gren[place]ittany and [place]. Minerva AreolaEric, Clinical Social Worker updated Gwenette GreetBrenda S Virat Prather RN MSN CCM Care Management [phone]

## 2017-02-20 NOTE — Care Management Note (Addendum)
Case Management Note  Patient Details  Name: Zachary RavelSamuel D Beaupre MRN: [id number] Date of Birth: 1931/02/13  Subjective/Objective:    Ad[facility] to [facility] with the diagnosis pneumonia. Lives with wife Vena Austrialeanor [phone]).  Daughter is GrenadaBrittany [phone]). Sees Dr. Reola CalkinsBeck as primary care physician. Prescriptions are filled at [facility]. [facility]. [facility] and [facility]. No home oxygen. The only medical equipment in the home is a cane. Self feed. Sometimes needs help with dressing and baths. No falls prior to going to facilities. Good appetite. Family will transport.               Action/Plan: Physical therapy recommending skilled nursing facility. Daughter and wife at the bedside. Declining skilled facility. Wants to [facility] with [facility]. Will need rolling walker, bedside commode. May need home oxygen. Will keep foley.    Expected Discharge Date:                  Expected Discharge Plan:     In-House Referral:   yes  Discharge planning Services   yes  Post Acute Care Choice:    Choice offered to:     DME Arranged:    DME Agency:     HH Arranged:    HH Agency:     Status of Service:     If discussed at [employer] Length of Stay Meetings, dates discussed:    Additional Comments:  Gwenette GreetBrenda S Terisa Belardo, RN MSN CCM Care Management [phone] 02/20/2017, 11:08 AM

## 2017-02-20 NOTE — Progress Notes (Signed)
[facility] - [place] at [facility]   PATIENT NAME: Zachary Peterson    MR#:  [id number]  DATE OF BIRTH:  11-Feb-1931  SUBJECTIVE:  CHIEF COMPLAINT:   Chief Complaint  Patient presents with  . Shortness of Breath    runny nose, Recent Dx viral Pneumonia, norovirus   Feels weak. Poor appetite. Some hematuria  REVIEW OF SYSTEMS:    Review of Systems  Unable to perform ROS: Mental status change    DRUG ALLERGIES:   Allergies  Allergen Reactions  . Ativan [Lorazepam] Anaphylaxis  . Aspirin Other (See Comments)    CAUSES ULCERS  . Codeine Nausea And Vomiting  . Oxycodone Nausea And Vomiting    VITALS:  Blood pressure 128/68, pulse 93, temperature 97.7 F (36.5 C), temperature source Oral, resp. rate (!) 21, height 5\' 9"  (1.753 m), weight 63.1 kg (139 lb 1.8 oz), SpO2 93 %.  PHYSICAL EXAMINATION:   Physical Exam  GENERAL:  84 y.o.-year-old patient lying in the bed  EYES: Pupils equal, round, reactive to light and accommodation. No scleral icterus. Extraocular muscles intact.  HEENT: Head atraumatic, normocephalic. Oropharynx and nasopharynx clear.  NECK:  Supple, no jugular venous distention. No thyroid enlargement, no tenderness.  LUNGS: Bilateral wheezing and decreased air entry CARDIOVASCULAR: S1, S2 normal. No murmurs, rubs, or gallops.  ABDOMEN: Soft, nontender, nondistended. Bowel sounds present. No organomegaly or mass.  Foley in place EXTREMITIES: No cyanosis, clubbing or edema b/l.    NEUROLOGIC: Moving all 4 extremities PSYCHIATRIC: The patient is awake SKIN: No obvious rash, lesion, or ulcer.   LABORATORY PANEL:   CBC Recent Labs  Lab 02/18/17 0445  WBC 3.9  HGB 9.1*  HCT 27.0*  PLT 330   ------------------------------------------------------------------------------------------------------------------ Chemistries  Recent Labs  Lab 02/15/17 1110  02/18/17 0445  NA 136   < > 135  K 4.4   < > 4.2  CL 101   < > 103  CO2 25   < >  24  GLUCOSE 140*   < > 189*  BUN 23*   < > 15  CREATININE 1.45*   < > 1.04  CALCIUM 8.8*   < > 7.6*  MG  --    < > 1.7  AST 30  --   --   ALT 16*  --   --   ALKPHOS 100  --   --   BILITOT 0.5  --   --    < > = values in this interval not displayed.   ------------------------------------------------------------------------------------------------------------------  Cardiac Enzymes Recent Labs  Lab 02/16/17 1632  TROPONINI <0.03   ------------------------------------------------------------------------------------------------------------------  RADIOLOGY:  No results found.   ASSESSMENT AND PLAN:   * Multifocal healthcare acquired pneumonia vs aspiration PNA MRSA PCR negative.  On IV Unasyn. Change to augmentin Culture NGTD Still has SOB and on 4 L O2.  *Acute COPD exacerbation.  Nebulizers. Finished steroids.  * Paroxysmal atrial fibrillation.  On beta-blocker therapy.  Coumadin.  * Severe protein calorie malnutrition. Nutritional supplements  * Acute diarrhea. Resolved. C diff ordered but no further diarrhea.  * Acute on chronic hypoxic respiratory failure due to pneumonia.  Wean oxygen as tolerated.  Nebulizers  * Acute toxic and metabolic encephalopathy. Resolved.  * urinary retention Foley placed. D/c with foley to follow up with urology  * CKD stage III is stable.  * Anemia of chronic disease  * DVT prophylaxis.  On Coumadin.  All the records are reviewed and case discussed with  Care Management/Social Worker Management plans discussed with the patient, family and they are in agreement.  CODE STATUS: DNR  DVT Prophylaxis: SCDs  TOTAL TIME TAKING CARE OF THIS PATIENT: 35 minutes.   POSSIBLE D/C IN 2-3 DAYS, DEPENDING ON CLINICAL CONDITION.  Molinda BailiffSrikar R Avya Flavell M.D on 02/20/2017 at 1:28 PM  Between 7am to 6pm - Pager - [phone]  After 6pm go to [email or web address] - password [email or web address]  [facility] [place] [facility]  Office   [phone]  CC: Primary care physician; Ignacia PalmaBeck, Mark C., MD  Note: This dictation was prepared with Dragon dictation along with smaller phrase technology. Any transcriptional errors that result from this process are unintentional.

## 2017-02-20 NOTE — Progress Notes (Signed)
Pt c/o SCD's making him feel too hot and requesting them to be removed for the night.  Pt has tossed off covers and gotten foley catheter tubing taught around knee. Catheter tubing loosened and repositioned.  SCD's removed per pt request.  Pt reassured and reoriented to time and place.

## 2017-02-20 NOTE — Consult Note (Signed)
ANTICOAGULATION CONSULT NOTE   Pharmacy Consult for Warfarin dosing and monitoring  Indication: atrial fibrillation  Allergies  Allergen Reactions  . Ativan [Lorazepam] Anaphylaxis  . Aspirin Other (See Comments)    CAUSES ULCERS  . Codeine Nausea And Vomiting  . Oxycodone Nausea And Vomiting   Patient Measurements: Height: 5\' 9"  (175.3 cm) Weight: 139 lb 1.8 oz (63.1 kg) IBW/kg (Calculated) : 70.7  Vital Signs: Temp: 97.7 F (36.5 C) (02/13 0616) Temp Source: Oral (02/13 0616) BP: 140/66 (02/13 0616) Pulse Rate: 89 (02/13 0616)  Labs: Recent Labs    02/18/17 0445 02/19/17 0539 02/20/17 0530  HGB 9.1*  --   --   HCT 27.0*  --   --   PLT 330  --   --   LABPROT 13.0 14.6 15.9*  INR 0.99 1.15 1.28  CREATININE 1.04  --   --     Estimated Creatinine Clearance: 48 mL/min (by C-G formula based on SCr of 1.04 mg/dL).   Medical History: Past Medical History:  Diagnosis Date  . Back pain   . COPD (chronic obstructive pulmonary disease) (HCC)   . Depression   . Fatigue    a. in setting of Afib, improved following DCCV.  [name]. Hearing loss   . History of GI bleed 1976  . Hyperlipemia   . Hypertension   . Migraine   . Palpitations   . Persistent atrial fibrillation (HCC)    a. 02/2016 Echo: EF 66-65%, mild MR;  b. CHA2DS2VASc = 3-->coumadin;  c. 05/2016 TEE/DCCV: EF 45-50%, triv AI, mild to mod MR, no LA/LAA/RA/RAA thrombus, mod to sev TR-->successful DCCV.  . Seizure (HCC)   . Syncope    a. related to seizure disorder    Assessment: Pharmacy consulted for warfarin dosing and monitoring in 84 yo male with PMH of A. Fib. [facility] warfarin 2.5mg  daily. INR on admission was subtherapeutic at 1.09.  DATE INR DOSE 2/10 1.13 4 mg 2/11 0.99 5 mg 2/12 1.15 4 mg 2/13 1.28 4 mg  Goal of Therapy:  INR 2-3 Monitor platelets by anticoagulation protocol: Yes   Plan:   INR still subtherapeutic. Expect to see increase in INR on 2/13.  Patient also receiving Abx which can increase INR.  Will order warfarin 4mg  x 1 tonight and will recheck INR with am labs.   Pharmacy will continue to monitor and adjust per consult.   Carola FrostNathan A Naila Elizondo, PharmD, BCPS Clinical Pharmacist 02/20/2017 7:07 AM

## 2017-02-20 NOTE — Plan of Care (Signed)
  Progressing Education: Knowledge of General Education information will improve 02/20/2017 0240 - Progressing by Jeanelle Mallingawlins, Ashvin Adelson M, RN Health Behavior/Discharge Planning: Ability to manage health-related needs will improve 02/20/2017 0240 - Progressing by Jeanelle Mallingawlins, Anarely Nicholls M, RN Clinical Measurements: Ability to maintain clinical measurements within normal limits will improve 02/20/2017 0240 - Progressing by Jeanelle Mallingawlins, Amey Hossain M, RN Will remain free from infection 02/20/2017 0240 - Progressing by Jeanelle Mallingawlins, Gaberiel Youngblood M, RN Diagnostic test results will improve 02/20/2017 0240 - Progressing by Jeanelle Mallingawlins, Terrel Nesheiwat M, RN Respiratory complications will improve 02/20/2017 0240 - Progressing by Jeanelle Mallingawlins, Rekisha Welling M, RN Cardiovascular complication will be avoided 02/20/2017 0240 - Progressing by Jeanelle Mallingawlins, Raelie Lohr M, RN Activity: Risk for activity intolerance will decrease 02/20/2017 0240 - Progressing by Jeanelle Mallingawlins, Viki Carrera M, RN Nutrition: Adequate nutrition will be maintained 02/20/2017 0240 - Progressing by Jeanelle Mallingawlins, Mylik Pro M, RN Coping: Level of anxiety will decrease 02/20/2017 0240 - Progressing by Jeanelle Mallingawlins, Bailley Guilford M, RN Elimination: Will not experience complications related to bowel motility 02/20/2017 0240 - Progressing by Jeanelle Mallingawlins, Christna Kulick M, RN Will not experience complications related to urinary retention 02/20/2017 0240 - Progressing by Jeanelle Mallingawlins, Kevon Tench M, RN Pain Managment: General experience of comfort will improve 02/20/2017 0240 - Progressing by Jeanelle Mallingawlins, Yarnell Kozloski M, RN Safety: Ability to remain free from injury will improve 02/20/2017 0240 - Progressing by Jeanelle Mallingawlins, Staceyann Knouff M, RN Skin Integrity: Risk for impaired skin integrity will decrease 02/20/2017 0240 - Progressing by Jeanelle Mallingawlins, Fermin Yan M, RN

## 2017-02-20 NOTE — Care Management Important Message (Signed)
Important Message  Patient Details  Name: Zachary RavelSamuel D Renken MRN: [id number] Date of Birth: [birth year]   Medicare Important Message Given:  Yes    Gwenette GreetBrenda S Audryna Wendt, RN 02/20/2017, 6:47 AM

## 2017-02-21 DIAGNOSIS — J189 Pneumonia, unspecified organism: Secondary | ICD-10-CM

## 2017-02-21 DIAGNOSIS — E43 Unspecified severe protein-calorie malnutrition: Secondary | ICD-10-CM

## 2017-02-21 DIAGNOSIS — R531 Weakness: Secondary | ICD-10-CM

## 2017-02-21 DIAGNOSIS — Z66 Do not resuscitate: Secondary | ICD-10-CM

## 2017-02-21 DIAGNOSIS — Z515 Encounter for palliative care: Secondary | ICD-10-CM

## 2017-02-21 LAB — PROTIME-INR
INR: 1.3
Prothrombin Time: 16.1 seconds — ABNORMAL HIGH (ref 11.4–15.2)

## 2017-02-21 LAB — BASIC METABOLIC PANEL
ANION GAP: 9 (ref 5–15)
BUN: 17 mg/dL (ref 6–20)
CO2: 24 mmol/L (ref 22–32)
Calcium: 8 mg/dL — ABNORMAL LOW (ref 8.9–10.3)
Chloride: 101 mmol/L (ref 101–111)
Creatinine, Ser: 1.01 mg/dL (ref 0.61–1.24)
GFR calc Af Amer: >60 mL/min (ref >60)
Glucose, Bld: 112 mg/dL — ABNORMAL HIGH (ref 65–99)
POTASSIUM: 4.3 mmol/L (ref 3.5–5.1)
SODIUM: 134 mmol/L — AB (ref 135–145)

## 2017-02-21 LAB — PHOSPHORUS: PHOSPHORUS: 3.3 mg/dL (ref 2.5–4.6)

## 2017-02-21 MED ORDER — ADULT MULTIVITAMIN LIQUID CH
15.0000 mL | Freq: Every day | ORAL | Status: DC
  Administered 2017-02-22: 15 mL via ORAL
  Filled 2017-02-21: qty 15

## 2017-02-21 MED ORDER — SENNOSIDES 8.8 MG/5ML PO SYRP
5.0000 mL | ORAL_SOLUTION | Freq: Two times a day (BID) | ORAL | Status: DC
  Administered 2017-02-21 – 2017-02-22 (×3): 5 mL via ORAL
  Filled 2017-02-21 (×3): qty 5

## 2017-02-21 MED ORDER — MIRTAZAPINE 15 MG PO TBDP
15.0000 mg | ORAL_TABLET | Freq: Every day | ORAL | Status: DC
  Administered 2017-02-21 – 2017-02-22 (×2): 15 mg via ORAL
  Filled 2017-02-21 (×2): qty 1

## 2017-02-21 MED ORDER — AMOXICILLIN-POT CLAVULANATE 400-57 MG/5ML PO SUSR
875.0000 mg | Freq: Two times a day (BID) | ORAL | Status: DC
  Administered 2017-02-21 – 2017-02-22 (×3): 875 mg via ORAL
  Filled 2017-02-21 (×3): qty 10.9

## 2017-02-21 MED ORDER — IPRATROPIUM-ALBUTEROL 0.5-2.5 (3) MG/3ML IN SOLN: 3.0000 mL | Freq: Four times a day (QID) | RESPIRATORY_TRACT | 0 refills | Status: DC | PRN

## 2017-02-21 MED ORDER — GUAIFENESIN 100 MG/5ML PO SOLN
5.0000 mL | ORAL | Status: DC | PRN
  Filled 2017-02-21: qty 5

## 2017-02-21 MED ORDER — WARFARIN SODIUM 5 MG PO TABS
5.0000 mg | ORAL_TABLET | Freq: Every day | ORAL | Status: DC
  Administered 2017-02-21 – 2017-02-22 (×2): 5 mg via ORAL
  Filled 2017-02-21 (×2): qty 1

## 2017-02-21 MED ORDER — AMOXICILLIN-POT CLAVULANATE 875-125 MG PO TABS: 1.0000 | ORAL_TABLET | Freq: Two times a day (BID) | ORAL | 0 refills | Status: DC

## 2017-02-21 NOTE — Plan of Care (Signed)
Uneventful night, VSS, denies any pain all night. Foley still in place with bloody ting urine return. Foley free of kicks, secured and to gravity.

## 2017-02-21 NOTE — Consult Note (Signed)
ANTICOAGULATION CONSULT NOTE   Pharmacy Consult for Warfarin dosing and monitoring  Indication: atrial fibrillation  Allergies  Allergen Reactions  . Ativan [Lorazepam] Anaphylaxis  . Aspirin Other (See Comments)    CAUSES ULCERS  . Codeine Nausea And Vomiting  . Oxycodone Nausea And Vomiting   Patient Measurements: Height: 5\' 9"  (175.3 cm) Weight: 139 lb 1.8 oz (63.1 kg) IBW/kg (Calculated) : 70.7  Vital Signs: Temp: 98.3 F (36.8 C) (02/14 0555) Temp Source: Oral (02/14 0555) BP: 142/69 (02/14 0555) Pulse Rate: 86 (02/14 0555)  Labs: Recent Labs    02/19/17 0539 02/20/17 0530 02/21/17 0514  LABPROT 14.6 15.9* 16.1*  INR 1.15 1.28 1.30    Estimated Creatinine Clearance: 48 mL/min (by C-G formula based on SCr of 1.04 mg/dL).   Medical History: Past Medical History:  Diagnosis Date  . Back pain   . COPD (chronic obstructive pulmonary disease) (HCC)   . Depression   . Fatigue    a. in setting of Afib, improved following DCCV.  [name]. Hearing loss   . History of GI bleed 1976  . Hyperlipemia   . Hypertension   . Migraine   . Palpitations   . Persistent atrial fibrillation (HCC)    a. 02/2016 Echo: EF 66-65%, mild MR;  b. CHA2DS2VASc = 3-->coumadin;  c. 05/2016 TEE/DCCV: EF 45-50%, triv AI, mild to mod MR, no LA/LAA/RA/RAA thrombus, mod to sev TR-->successful DCCV.  . Seizure (HCC)   . Syncope    a. related to seizure disorder    Assessment: Pharmacy consulted for warfarin dosing and monitoring in 84 yo male with PMH of A. Fib. [facility].5[facility]  daily. INR on admission was subtherapeutic at 1.09.  DATE INR DOSE 2/10 1.13 4 mg 2/11 0.99 5 mg 2/12 1.15 4 mg 2/13 1.28 4 mg 2/14 1.3 5 mg  Goal of Therapy:  INR 2-3 Monitor platelets by anticoagulation protocol: Yes   Plan:   INR still subtherapeutic. Will increase to 5 mg po daily and follow INR daily.  Pharmacy will continue to monitor and adjust per consult.    Carola FrostNathan A Alfie Rideaux, PharmD, BCPS Clinical Pharmacist 02/21/2017 7:34 AM

## 2017-02-21 NOTE — Progress Notes (Signed)
OT Cancellation Note  Patient Details Name: Zachary RavelSamuel D Beeck MRN: [id number] DOB: 1931/01/14   Cancelled Treatment:    Reason Eval/Treat Not Completed: Patient at procedure or test/ unavailable. Upon attempt to treat, pt with nursing for labs draw. Will re-attempt at later date/time as pt is available and medically appropriate.   Richrd PrimeJamie Stiller, MPH, MS, OTR/L ascom [phone] 02/21/17, 3:52 PM

## 2017-02-21 NOTE — Progress Notes (Signed)
Speech Language Pathology Treatment: Dysphagia  Patient Details Name: Zachary Peterson MRN: [id number] DOB: 1931/04/08 Today's Date: 02/21/2017 Time: 0950-1050 SLP Time Calculation (min) (ACUTE ONLY): 60 min  Assessment / Plan / Recommendation Clinical Impression  Pt seen this morning for ongoing toleration of diet and education w/ pt/family and staff re: pt's swallow function and need for a modified diet; aspiration and Reflux precautions. Pt continues to improve and has been transferred out of CCU. He and family are being followed by Palliative Care services for GOC. Pt is currently on a Dysphagia diet w/ MINCED meats w/ thin liquids; aspiration and Reflux precautions. Pt had a coughing episode the morning w/ NSG while swallowing Pills in puree - pt stated he felt he was swallowing "so much medication now" and that he "did not take this many pills at home". Suspect pt could have become fatigued w/ the task as well as the boluses did not clear the Esophagus adequately for continued boluses being given(pt has baseline Esophageal dysmotility). Family members arrived at end of session for education; updated.  Pt consumed few trials of thin liquids via CUP - pt uses a weighted drink cup d/t UE tremors(moderate+). No immediate, overt s/s of aspiration were noted(clear vocal quality post trials). Similar was noted w/ trials of puree fed to him. Pt required during and post trials for full oral clearing/swallowing. He appears easily fatigued w/ the exertion of the task of po trials. Pt also seemed drowsy and stated he "did not sleep last night".  Due to pt's ongoing, extended illness w/ weakness/fatigue, and to his baseline risk for aspiration of REFLUX material thus Pulmonary compromise from such d/t Esophageal dysmotility, recommend modifying diet further to Dysphagia level 2(all MINCED foods) w/ thin liquids; aspiration and Reflux precautions. Recommend Pills in Puree - CRUSHED or in liquid, powder forms to  mix in a puree for safer swallowing at this time. Pt would benefit from a modified diet and Pills CRUSH in puree at discharge as well for safer swallowing and ease of Esophageal clearing, reduced exertion. Education w/ family/Dtrs discussed the documented (01/2017) issue of pt's Esophageal dysmotility w/ Retrgograde activity of bolus material to the Pharynx which, if to the level of the Pharynx, places him at HIGH risk for aspiration of the Reflux material thus Pulmonary compromise from such. There was also concern of Reduced cricopharyngeal relaxation impeding bolus motility into the Esophagus thus increasing risk for bolus material to remain in the pharynx and laryngeal penetration/aspiration. A recommendation was made at the time of the exams for pt to f/u w/ ENT and GI for further assessment and management. Discussed how these issues can present as his oropharyngeal phase dysphagia outwardly, impact oral intake overall, and impact Pulmonary status/illness w/ even pneumonia. Dtrs agreed w/ information above as well as modifying the diet further in order to make oral intake and swallowing "easier" for pt - reduce demand and aid stamina for the meal. Discussed and modeled aspiration precautions; Reflux precautions. Discussed food options and preparation. ST services will be available for further education if needed while admitted.    HPI HPI: Pt is a 84 y.o. male with a known history of multiple medical issues including COPD, seizures, fatigue, HTN, afib, and UE Tremors (hands) who recently discharged from [facility] for pneumonia January 21, currently in inpatient rehab, since emergency room for shortness of breath, tachycardia, noted hypoxia with O2 saturation in the 80s, diarrhea for the last 1-2 days, in the emergency room patient was tachycardic,  tachypneic, hypoxic, patient evaluated in the emergency room, multiple family members at the bedside, chest x-ray noted for multifocal pneumonia, patient is now  being admitted for acute multifocal pneumonia-cannot rule out possible aspiration HCAP/given history of dysphagia. [facility]17. During that admission, pt had a Barium study and MBSS performed. Pt exhibited no aspiration of bolus material before/during the swallow but Retrograde bolus activity was noted from the Cervical Esophagus into the Pharynx. ENT/GI evaluations were recommended then. This presentation could greatly increase risk for aspiration of REFLUX material thus Pulmonary compromise from such. Chest CT in 2016 revealed Esophagus is decompressed, esophageal hiatal hernia. Head CT in 2018 revealed Moderate diffuse atrophy is stable. Unsure of pt's baseline Cognitive function. Pt was transferred to CCU post a Rapid Response day after admission d/t decline in medical and respiratory status' and blood pressure. Pt has transferred out of CCU now. Family present in room.       SLP Plan  Continue with current plan of care       Recommendations  Diet recommendations: Dysphagia 2 (fine chop);Thin liquid(mince more foods per Dtrs' request) Liquids provided via: Cup;No straw Medication Administration: Crushed with puree(or in liquid, powder forms) Supervision: Patient able to self feed;Intermittent supervision to cue for compensatory strategies(setup) Compensations: Minimize environmental distractions;Slow rate;Small sips/bites;Lingual sweep for clearance of pocketing;Multiple dry swallows after each bite/sip;Follow solids with liquid Postural Changes and/or Swallow Maneuvers: Seated upright 90 degrees;Upright 30-60 min after meal(rest breaks)                General recommendations: (Dietician f/u) Oral Care Recommendations: Oral care BID;Staff/trained caregiver to provide oral care Follow up Recommendations: None SLP Visit Diagnosis: Dysphagia, pharyngoesophageal phase (R13.14);Dysphagia, oropharyngeal phase (R13.12) Plan: Continue with current  plan of care       GO               Jerilynn SomKatherine Watson, MS, CCC-SLP Watson,Katherine 02/21/2017, 12:02 PM

## 2017-02-21 NOTE — Plan of Care (Addendum)
  Not Progressing Nutrition: Adequate nutrition will be maintained 02/21/2017 0950 - Not Progressing by Luretha MurphyMiles, Nichol Ator, RN  KATHERINE FROM SPEECH SAW  PT AND MODIFIED  MEDS/CRUSHED IN APPROPRIATE FOOD SOURCE.

## 2017-02-21 NOTE — Consult Note (Signed)
Consultation Note Date: 02/21/2017   Patient Name: Zachary Peterson  DOB: [birth year]  MRN: [id number]  Age / Sex: 84 y.o., male  PCP: Zachary Peterson., MD Referring Physician: Milagros Loll, MD  Reason for Consultation: Establishing goals of care and Psychosocial/spiritual support  HPI/Patient Profile: 84 y.o. male  admitted on 02/15/2017 with PMH of COPD, atrial fibrillation, seizure, mild cognitive impairment.  Family report slow physical, functional, and cognitive decline over the past year but have noted significant decline in the past month.  He was recently discharged from [facility] for pneumonia January 21, he was discharged to an inpatient rehab and family verbalize frustration and concerns with the care he received.  They do not believe that it was beneficial to this patient.  He was admitted through the emergency room for shortness of breath, tachycardia, hypoxia with O2 saturations in the 80s.  A chest x-ray noted for multifocal pneumonia, patient is now being admitted for acute multifocal pneumonia-cannot rule out possible aspiration HCAP/given   He has had generalized failure to thrive, and family face treatment option decisions, advance directive decisions and anticipatory care needs  Clinical Assessment and Goals of Care:  Concept of Hospice and Palliative Care were discussed  This NP Zachary Peterson reviewed medical records, received report from team, assessed the patient and then meet at the patient's bedside along with his 2 daughters Zachary Peterson and Zachary Peterson reported main decision maker for this patient # [phone]   to discuss diagnosis, prognosis, GOC, EOL wishes disposition and options.  Family friend named Zachary Peterson was present.  A detailed discussion was had today regarding advanced directives.  Concepts specific to code status, artifical feeding and hydration, continued IV  antibiotics and rehospitalization was had.  The difference between a aggressive medical intervention path  and a palliative comfort care path for this patient at this time was had.  Values and goals of care important to patient and family were attempted to be elicited.  Most of  this morning's conversation was regarding realistic treatment options and rehabilitation options for this patient.  Discussed the importance of family advocacy.    We discussed the concept of  failure to thrive and human mortality.  We revisited patient's issue of esophageal dysmotility as uncovered in his January hospitalization and his risk for aspiration and therefore current pneumonias.  Were many misconceptions verbalized by family regarding position options and in home care options.  Education was offered, we also discussed hospice benefit.  MOST form introduced  Natural trajectory and expectations at EOL were discussed.  Questions and concerns addressed.   Family encouraged to call with questions or concerns.    PMT will continue to support holistically.  There has been some discrepancy about who is the actual decision maker for this patient.  The 2 daughters that are present verbalize that the patient's spouse has deferred decisions to the daughters.. There is a granddaughter Zachary Peterson who is not Management consultant according to the people present in this morning's meeting.  There is no  documentation of healthcare power of attorney.  However today both daughters verbalized that all information and decision should go through Zachary Peterson, the number is listed above   SUMMARY OF RECOMMENDATIONS    Code Status/Advance Care Planning:  DNR   Symptom Management:   Weakness:  PT to make recommendations--today family is leaning towards skilled nursing facility for short-term rehab their first choice is [facility]  Dysphagia: Diet as recommended by speech  Palliative Prophylaxis:   Aspiration, Bowel Regimen, Delirium  Protocol, Frequent Pain Assessment and Oral Care  Additional Recommendations (Limitations, Scope, Preferences):  Full Scope Treatment  Psycho-social/Spiritual:   Desire for further Chaplaincy support:no  Additional Recommendations: Education on Hospice  Prognosis:   Unable to determine  Discharge Planning: To Be Determined      Primary Diagnoses: Present on Admission: . HCAP (healthcare-associated pneumonia)   I have reviewed the medical record, interviewed the patient and family, and examined the patient. The following aspects are pertinent.  Past Medical History:  Diagnosis Date  . Back pain   . COPD (chronic obstructive pulmonary disease) (HCC)   . Depression   . Fatigue    a. in setting of Afib, improved following DCCV.  [name]. Hearing loss   . History of GI bleed 1976  . Hyperlipemia   . Hypertension   . Migraine   . Palpitations   . Persistent atrial fibrillation (HCC)    a. 02/2016 Echo: EF 66-65%, mild MR;  b. CHA2DS2VASc = 3-->coumadin;  c. 05/2016 TEE/DCCV: EF 45-50%, triv AI, mild to mod MR, no LA/LAA/RA/RAA thrombus, mod to sev TR-->successful DCCV.  . Seizure (HCC)   . Syncope    a. related to seizure disorder   Social History   Socioeconomic History  . Marital status: Married    Spouse name: None  . Number of children: None  . Years of education: None  . Highest education level: None  Social Needs  . Financial resource strain: None  . Food insecurity - worry: None  . Food insecurity - inability: None  . Transportation needs - medical: None  . Transportation needs - non-medical: None  Occupational History  . None  Tobacco Use  . Smoking status: Former Games developermoker  . Smokeless tobacco: Former Engineer, waterUser  Substance and Sexual Activity  . Alcohol use: No  . Drug use: No  . Sexual activity: No  Other Topics Concern  . None  Social History Narrative  . None   Family History  Problem Relation Age of Onset  . Heart disease Mother   . Hypertension  Father    Scheduled Meds: . acidophilus  1 capsule Oral TID WC  . amoxicillin-clavulanate  875 mg Oral Q12H  . bisoprolol  2.5 mg Oral BID  . budesonide (PULMICORT) nebulizer solution  0.5 mg Nebulization BID  . diazepam  5 mg Oral Q12H  . enoxaparin (LOVENOX) injection  40 mg Subcutaneous Q24H  . ezetimibe  10 mg Oral Daily   And  . simvastatin  80 mg Oral q1800  . ipratropium-albuterol  3 mL Nebulization Q6H  . lacosamide  200 mg Oral BID  . mirtazapine  15 mg Oral QHS  . [START ON 02/22/2017] multivitamin  15 mL Oral Daily  . pregabalin  75 mg Oral BID  . sennosides  5 mL Oral BID  . tamsulosin  0.8 mg Oral QPC breakfast  . tiotropium  18 mcg Inhalation Daily  . warfarin  5 mg Oral q1800  . Warfarin - Pharmacist Dosing Inpatient  Does not apply q1800   Continuous Infusions: PRN Meds:.acetaminophen, calcium carbonate, fluticasone, guaiFENesin, metoprolol tartrate, ondansetron, polyvinyl alcohol Medications Prior to Admission:  Prior to Admission medications   Medication Sig Start Date End Date Taking? Authorizing Provider  bisoprolol (ZEBETA) 5 MG tablet Take 0.5 tablets (2.5 mg total) by mouth 2 (two) times daily. 05/11/16  Yes End, Cristal Deer, MD  diazepam (VALIUM) 5 MG tablet Take 5 mg by mouth 2 (two) times daily.   Yes [provider]  ezetimibe-simvastatin (VYTORIN) 10-80 MG tablet Take 1 tablet by mouth daily.   Yes [provider]  lacosamide (VIMPAT) 200 MG TABS tablet Take 1 tablet (200 mg total) by mouth 2 (two) times daily. 02/09/17  Yes Penny Pia, MD  mirtazapine (REMERON) 15 MG tablet Take 15 mg by mouth at bedtime.   Yes [provider]  pantoprazole (PROTONIX) 20 MG tablet Take 20 mg by mouth daily.   Yes [provider]  pregabalin (LYRICA) 75 MG capsule Take 75 mg by mouth 2 (two) times daily.   Yes [provider]  tamsulosin (FLOMAX) 0.4 MG CAPS capsule Take 2 capsules (0.8 mg total) by mouth daily after breakfast.  02/10/17  Yes Penny Pia, MD  tiotropium (SPIRIVA) 18 MCG inhalation capsule Place 18 mcg into inhaler and inhale daily.   Yes [provider]  warfarin (COUMADIN) 2.5 MG tablet TAKE 1 OR 2 TABLETS BY MOUTH EVERY DAY OR AS PHYSICIAN INSTRUCTED BY COUMADIN CLINIC Patient taking differently: TAKE 1 TABLET BY MOUTH DAILY 09/25/16  Yes End, Cristal Deer, MD  amoxicillin-clavulanate (AUGMENTIN) 875-125 MG tablet Take 1 tablet by mouth every 12 (twelve) hours. 02/21/17   Zachary Loll, MD  calcium carbonate (TUMS - DOSED IN MG ELEMENTAL CALCIUM) 500 MG chewable tablet Chew 1 tablet (200 mg of elemental calcium total) by mouth 3 (three) times daily with meals as needed for indigestion or heartburn. 02/24/16   Gouru, Deanna Artis, MD  fluticasone (FLONASE) 50 MCG/ACT nasal spray Place 2 sprays into both nostrils daily as needed for allergies.     [provider]  hydroxypropyl methylcellulose / hypromellose (ISOPTO TEARS / GONIOVISC) 2.5 % ophthalmic solution Place 1 drop into both eyes 3 (three) times daily as needed for dry eyes.    [provider]  ipratropium-albuterol (DUONEB) 0.5-2.5 (3) MG/3ML SOLN Take 3 mLs by nebulization every 6 (six) hours as needed. 02/21/17   Zachary Loll, MD  ondansetron (ZOFRAN ODT) 8 MG disintegrating tablet Take 1 tablet (8 mg total) by mouth every 8 (eight) hours as needed for nausea or vomiting. 04/13/14   Jaynie Crumble, PA-C   Allergies  Allergen Reactions  . Ativan [Lorazepam] Anaphylaxis  . Aspirin Other (See Comments)    CAUSES ULCERS  . Codeine Nausea And Vomiting  . Oxycodone Nausea And Vomiting   Review of Systems  Unable to perform ROS: Acuity of condition    Physical Exam  Constitutional: He appears lethargic. He appears cachectic. He appears ill.  Cardiovascular: Normal rate, regular rhythm and normal heart sounds.  Pulmonary/Chest: He has decreased breath sounds in the right lower field and the left lower field.  Neurological:  He appears lethargic.  Skin: Skin is warm and dry.    Vital Signs: BP (!) 105/50   Pulse 85   Temp (!) 97.5 F (36.4 C) (Oral)   Resp 18   Ht 5\' 9"  (1.753 m)   Wt 63.1 kg (139 lb 1.8 oz)   SpO2 92%   BMI 20.54 kg/m  Pain Assessment: No/denies pain POSS *See Group Information*: S-Acceptable,Sleep, easy to arouse Pain Score: 3    SpO2: SpO2: 92 % O2 Device:SpO2: 92 % O2 Flow Rate: .O2 Flow Rate (L/min): 2 L/min  IO: Intake/output summary:   Intake/Output Summary (Last 24 hours) at 02/21/2017 1539 Last data filed at 02/21/2017 0136 Gross per 24 hour  Intake 360 ml  Output 1400 ml  Net -1040 ml    LBM: Last BM Date: 02/20/17 Baseline Weight: Weight: 61.2 kg (135 lb) Most recent weight: Weight: 63.1 kg (139 lb 1.8 oz)     Palliative Assessment/Data: 30 %   Discussed with Dr Elpidio Anis  Time In: 0830 Time Out: 0945 Time Total: 75 minutes Greater than 50%  of this time was spent counseling and coordinating care related to the above assessment and plan.  Signed by: Zachary Creed, NP   Please contact Palliative Medicine Team phone at [phone] for questions and concerns.  For individual provider: See Loretha Stapler

## 2017-02-21 NOTE — Progress Notes (Signed)
New referral for out patient Palliative to follow at home received from [facility] Nann Greene. Patient information faxed to referral. °Karen Robertson RN, BSN, CHPN °[facility], hospital Liaison °[phone] °

## 2017-02-21 NOTE — Care Management (Signed)
Spoke with patient's daughter Angelique BlonderDenise.  Family very clear about patient discharging home with home health through [facility].  Equipment needs: hospital bed, BSC, Walker, possible home nebulizer, home oxygen.  At present, family trying to decide method of transport home.  Discussed medically necessity for ems transport.  Requested order for DME.  Discussed with Angelique BlonderDenise patient could discharge within next 24 hours.  Hospital bed needs to be in place before patient arrives home  medical necessity for hospital be  Patient suffers from  copd and has trouble breathing at night when head is elevated less  than 30  degrees. Bed wedges do not provide enough elevation to resolve breathing issues. Shortness of breath cause patient to require frequent changes in body position which cannot be achieved with a normal bed.

## 2017-02-21 NOTE — Clinical Social Work Note (Signed)
CSW spoke to patient's daughter Zachary Peterson, and presented bed offers for SNF.  Patient's daughter did not like any of the options.  Patient's family have decided they would like to have him go home with home health and palliative to follow.  Case discussed with case manager and plan is to discharge home with home health.  CSW to sign off please re-consult if social work needs arise.  Zachary Peterson, MSW, [employer]CSWA [phone]

## 2017-02-21 NOTE — Progress Notes (Signed)
Nutrition Follow-up  DOCUMENTATION CODES:   Severe malnutrition in context of acute illness/injury  INTERVENTION:  Continue Ensure milkshakes as snacks from kitchen BID between meals. Make with strawberry Ensure Enlive and vanilla ice cream. Each supplement provides 480 kcal and 22 grams of protein.  Continue Magic cup TID with meals, each supplement provides 290 kcal and 9 grams of protein.  Continue daily MVI.  NUTRITION DIAGNOSIS:   Severe Malnutrition related to acute illness(inadequate intake following intubation in setting of respiratory failure s/p seizures, now PNA) as evidenced by moderate fat depletion, moderate muscle depletion, percent weight loss.  Ongoing.  GOAL:   Patient will meet greater than or equal to 90% of their needs  Not met.  MONITOR:   PO intake, Supplement acceptance, Labs, Weight trends, Skin, I & O's  REASON FOR ASSESSMENT:   Consult Assessment of nutrition requirement/status  ASSESSMENT:   84 year old male with PMHx of COPD, HLD, hx GI bleed, seizure disorder, HTN, depression, hearing loss, back pain, A-fib, recent admission to [facility] 1/19-2/2 for acute respiratory distress s/p seizures requiring intubation, after which patient discharged to [facility] for STR. Patient now admitted with PNA and diarrhea and transferred to ICU on 2/9  for hemodynamic instability.  -On 2/14 diet was downgraded to dysphagia 2 with thin liquids. -Per chart family would like patient to come home.  Met with patient at bedside. No family members present at time of RD assessment. He reports his appetite is decreasing. Noted in chart intake has been 10-50% of meals lately.   Medications reviewed and include: acidophilus, Remeron, liquid MVI daily, Senokot, Flomax, warfarin.  Labs reviewed: Phosphorus 1.6 on 2/11 and has not been checked since. Discussed with pharmacy and plan is for a follow-up lab today.  Diet Order:  Aspiration precautions Fall  precautions DIET DYS 2 Room service appropriate? Yes with Assist; Fluid consistency: Thin  EDUCATION NEEDS:   No education needs have been identified at this time  Skin:  Skin Assessment: Reviewed RN Assessment(ecchymosis to bilateral arms)  Last BM:  02/17/2017 - large type 6  Height:   Ht Readings from Last 1 Encounters:  02/15/17 '5\' 9"'$  (1.753 m)    Weight:   Wt Readings from Last 1 Encounters:  02/15/17 139 lb 1.8 oz (63.1 kg)    Ideal Body Weight:  67.27 kg  BMI:  Body mass index is 20.54 kg/m.  Estimated Nutritional Needs:   Kcal:  1720-1850 (MSJ x 1.3-1.4)  Protein:  80-90 grams (1.3-1.4 grams/kg)  Fluid:  1.6-1.9 L/day (25-30 mL/kg)  Willey Blade, MS, RD, LDN Office: [phone] Pager: [phone] After Hours/Weekend Pager: [phone]

## 2017-02-21 NOTE — Discharge Instructions (Signed)
°   moist, soft-textured and easily formed into a bolus. Meats and other select foods may be ground or minced into small pieces no larger than . All food items should be easy to chew.

## 2017-02-21 NOTE — Progress Notes (Signed)
[facility] - [place] at [facility]   PATIENT NAME: Zachary IvanoffSamuel Peterson    MR#:  [id number]  DATE OF BIRTH:  [birth year]  SUBJECTIVE:  CHIEF COMPLAINT:   Chief Complaint  Patient presents with  . Shortness of Breath    runny nose, Recent Dx viral Pneumonia, norovirus   Foley catheter in place.  No further hematuria today. Continues to feel weak.  Poor appetite.  Family at bedside.  REVIEW OF SYSTEMS:    Review of Systems  Unable to perform ROS: Mental status change    DRUG ALLERGIES:   Allergies  Allergen Reactions  . Ativan [Lorazepam] Anaphylaxis  . Aspirin Other (See Comments)    CAUSES ULCERS  . Codeine Nausea And Vomiting  . Oxycodone Nausea And Vomiting    VITALS:  Blood pressure (!) 105/50, pulse 85, temperature (!) 97.5 F (36.4 C), temperature source Oral, resp. rate 18, height 5\' 9"  (1.753 m), weight 63.1 kg (139 lb 1.8 oz), SpO2 92 %.  PHYSICAL EXAMINATION:   Physical Exam  GENERAL:  84 y.o.-year-old patient lying in the bed  EYES: Pupils equal, round, reactive to light and accommodation. No scleral icterus. Extraocular muscles intact.  HEENT: Head atraumatic, normocephalic. Oropharynx and nasopharynx clear.  NECK:  Supple, no jugular venous distention. No thyroid enlargement, no tenderness.  LUNGS: Clear to auscultation bilaterally.  Decreased air entry at the bases. CARDIOVASCULAR: S1, S2 normal. No murmurs, rubs, or gallops.  ABDOMEN: Soft, nontender, nondistended. Bowel sounds present. No organomegaly or mass.  Foley in place EXTREMITIES: No cyanosis, clubbing or edema b/l.    NEUROLOGIC: Moving all 4 extremities PSYCHIATRIC: The patient is awake SKIN: No obvious rash, lesion, or ulcer.   LABORATORY PANEL:   CBC Recent Labs  Lab 02/18/17 0445  WBC 3.9  HGB 9.1*  HCT 27.0*  PLT 330   ------------------------------------------------------------------------------------------------------------------ Chemistries  Recent Labs  Lab  02/15/17 1110  02/18/17 0445  NA 136   < > 135  K 4.4   < > 4.2  CL 101   < > 103  CO2 25   < > 24  GLUCOSE 140*   < > 189*  BUN 23*   < > 15  CREATININE 1.45*   < > 1.04  CALCIUM 8.8*   < > 7.6*  MG  --    < > 1.7  AST 30  --   --   ALT 16*  --   --   ALKPHOS 100  --   --   BILITOT 0.5  --   --    < > = values in this interval not displayed.   ------------------------------------------------------------------------------------------------------------------  Cardiac Enzymes Recent Labs  Lab 02/16/17 1632  TROPONINI <0.03   ------------------------------------------------------------------------------------------------------------------  RADIOLOGY:  No results found.   ASSESSMENT AND PLAN:   * Multifocal healthcare acquired pneumonia vs aspiration PNA and acute on chronic hypoxic respiratory failure MRSA PCR negative.  Was On IV Unasyn. Changed to augmentin Culture NGTD Wean oxygen as tolerated Add flutter valve  *Acute COPD exacerbation.  Nebulizers. Finished steroids.  * Paroxysmal atrial fibrillation.  On beta-blocker therapy.  Coumadin.  * Severe protein calorie malnutrition. Nutritional supplements  * Acute diarrhea. Resolved. C diff ordered but no further diarrhea.  * Acute toxic and metabolic encephalopathy. Resolved.  * urinary retention Foley placed. D/c with foley to follow up with urology  * CKD stage III is stable.  * Anemia of chronic disease  * DVT prophylaxis.  On Coumadin.  Discussed with patient and family at bedside with palliative care nurse practitioner in the room.  Patient continues to be weak.  At this time decision has been made to have physical therapy evaluate.  Family wants to consider skilled nursing facility.  Discussed with speech therapy.  Discussed regarding goals of care with his pneumonia, dysphagia, weakness, recurrent admissions.  Patient is DNR.  All the records are reviewed and case discussed with Care  Management/Social Worker Management plans discussed with the patient, family and they are in agreement.  CODE STATUS: DNR  DVT Prophylaxis: SCDs  TOTAL TIME TAKING CARE OF THIS PATIENT: 45 minutes with 20 minutes spent exclusively with advance care planning.  POSSIBLE D/C IN 2-3 DAYS, DEPENDING ON CLINICAL CONDITION.  Orie Fisherman M.D on 02/21/2017 at 2:35 PM  Between 7am to 6pm - Pager - [phone]  After 6pm go to [email or web address] - password [email or web address]  [facility]  Office  [phone]  CC: Primary care physician; Ignacia Palma., MD  Note: This dictation was prepared with Dragon dictation along with smaller phrase technology. Any transcriptional errors that result from this process are unintentional.

## 2017-02-21 NOTE — Care Management (Signed)
Contacting [facility] to discuss home health services. Reaching out to [facility] regarding bedside commode and walker.  Patient will require home 02 assessment.

## 2017-02-22 LAB — PROTIME-INR
INR: 1.49
Prothrombin Time: 17.9 seconds — ABNORMAL HIGH (ref 11.4–15.2)

## 2017-02-22 MED ORDER — PREDNISONE 50 MG PO TABS
50.0000 mg | ORAL_TABLET | Freq: Once | ORAL | Status: AC
  Administered 2017-02-22: 12:00:00 50 mg via ORAL
  Filled 2017-02-22: qty 1

## 2017-02-22 MED ORDER — LACOSAMIDE 200 MG PO TABS: 200.0000 mg | ORAL_TABLET | Freq: Two times a day (BID) | ORAL | 0 refills | Status: AC

## 2017-02-22 MED ORDER — PREDNISONE 10 MG (21) PO TBPK: ORAL_TABLET | ORAL | 0 refills | Status: DC

## 2017-02-22 NOTE — Consult Note (Addendum)
ANTICOAGULATION CONSULT NOTE   Pharmacy Consult for Warfarin dosing and monitoring  Indication: atrial fibrillation  Allergies  Allergen Reactions  . Ativan [Lorazepam] Anaphylaxis  . Aspirin Other (See Comments)    CAUSES ULCERS  . Codeine Nausea And Vomiting  . Oxycodone Nausea And Vomiting   Patient Measurements: Height: 5\' 9"  (175.3 cm) Weight: 139 lb 1.8 oz (63.1 kg) IBW/kg (Calculated) : 70.7  Vital Signs: Temp: 98.9 F (37.2 C) (02/15 0333) Temp Source: Axillary (02/15 0333) BP: 125/56 (02/15 0333) Pulse Rate: 88 (02/15 0333)  Labs: Recent Labs    02/20/17 0530 02/21/17 0514 02/21/17 1545 02/22/17 0401  LABPROT 15.9* 16.1*  --  17.9*  INR 1.28 1.30  --  1.49  CREATININE  --   --  1.01  --     Estimated Creatinine Clearance: 49.5 mL/min (by C-G formula based on SCr of 1.01 mg/dL).   Medical History: Past Medical History:  Diagnosis Date  . Back pain   . COPD (chronic obstructive pulmonary disease) (HCC)   . Depression   . Fatigue    a. in setting of Afib, improved following DCCV.  [name]. Hearing loss   . History of GI bleed 1976  . Hyperlipemia   . Hypertension   . Migraine   . Palpitations   . Persistent atrial fibrillation (HCC)    a. 02/2016 Echo: EF 66-65%, mild MR;  b. CHA2DS2VASc = 3-->coumadin;  c. 05/2016 TEE/DCCV: EF 45-50%, triv AI, mild to mod MR, no LA/LAA/RA/RAA thrombus, mod to sev TR-->successful DCCV.  . Seizure (HCC)   . Syncope    a. related to seizure disorder    Assessment: Pharmacy consulted for warfarin dosing and monitoring in 84 yo male with PMH of A. Fib. [facility] 2.5[facility]  daily. INR on admission was subtherapeutic at 1.09.  DATE INR DOSE 2/10 1.13 4 mg 2/11 0.99 5 mg 2/12 1.15 4 mg 2/13 1.28 4 mg 2/14 1.3 5 mg 2/15     1.49 5 mg   Goal of Therapy:  INR 2-3 Monitor platelets by anticoagulation protocol: Yes   Plan:   INR still subtherapeutic. Will continue 5 mg po daily and  follow INR daily.  Pharmacy will continue to monitor and adjust per consult.   Demetrius CharityJames,Luie Laneve D, PharmD, BCPS Clinical Pharmacist 02/22/2017 7:43 AM

## 2017-02-22 NOTE — Care Management (Signed)
Patient for discharge home today.  Have obtained orders for Hospital bed, BSC, walker, home nebulizer, hoyer lift  and oxygen, possibly a wheelchair.  There is much back and forth between family members as there is concern now that patient's physical care needs will exceed caregivers capabilities. Discussed with family members present that if proceed with discharge home and then wish to obtain placement, placement can be coordinated from home.  Return to hospital for placement only would not be an option.  Updated [facility].  CM then received call that daughter wanted to know why [facility] declined to offer a bed.  Discussed that facilities do not have to give a reason.  Family has requested that information can be sent again for receive of reason it was declined and to see if facility will reconsider.  CSW resent the information and CM called for update.  Informed that CM will be notified "in a few Minutes" of determination

## 2017-02-22 NOTE — Progress Notes (Signed)
SATURATION QUALIFICATIONS: (This note is used to comply with regulatory documentation for home oxygen)  Patient Saturations on Room Air at Rest = 86%  Patient Saturations on Room Air while Ambulating =N/A  Patient Saturations on 3Liters of oxygen while Ambulating =N/A Please briefly explain why patient needs home oxygen:  Pt needs O2 at rest - he recovered to 91% on 3L.  We didn't ambulate him on RA since he had already desatted below 88 at rest.

## 2017-02-22 NOTE — Progress Notes (Signed)
OT Cancellation Note  Patient Details Name: Zachary Peterson MRN: [id number] DOB: 08-09-1931   Cancelled Treatment:    Reason Eval/Treat Not Completed: Other (comment). Spoke briefly to pt and pt's daughter in the room. Waiting for EMS to transfer home. Daughter expressed concern about going home. OT provided brief overview of HHOT's role in providing pt/caregiver education and training in functional mobility and self care skills to maximize pt's safety, independence, and minimize falls risk. Pt/dtr deny any additional therapy needs at this time. If pt doesn't discharge today, will attempt to treat tomorrow.   Richrd PrimeJamie Stiller, MPH, MS, OTR/L ascom [phone] 02/22/17, 4:26 PM

## 2017-02-22 NOTE — Clinical Social Work Note (Signed)
CSW was informed by case manager that patient's family requested that CSW fax clinical information to [facility] again even though they have declined patient already.  CSW refaxed clinical information, snf is still denying patient to go to SNF.  Patient's family does not want patient to go to any of the bed offers that were provided, patient's family have decided to take patient home with home health instead.  CSW updated insurance company and requested that authorization be cancelled.  Case manager is aware and has set up home health.  Zachary KnackEric R. Peterson Zachary Peterson, MSW, Zachary MajorsLCSWA [phone]  02/22/2017 4:56 PM

## 2017-02-22 NOTE — Care Management (Signed)
CM spoke with patient's daughters. All dme has been arranged and patient to travel by ems. Family is committed to make every effort to meet patient's care needs. Obtained order for increase liter flow.  palliative to follow as outpatient. [facility] very much aware of need to follow patient closely

## 2017-02-22 NOTE — Progress Notes (Signed)
Pt family called right before 2200 that bed had arrived. This Clinical research associatewriter explained to family that EMS had come for transport and that they were backed up. Family said since there was no idea what time EMS would be able to transport tonight that they rather him stay until in the morning. Spoke with Dr Caryn BeeMaier. She gave order to suspend discharge until tomorrow. Will continue to monitor.

## 2017-02-22 NOTE — Progress Notes (Signed)
Pt's family asked if they could go ahead and take the Rxs and get them filled at their pharmacy in anticipation of the pt being discharged home today; gave Rxs for Vimpat, Augmentin, Duoneb and Prednisone to the pt's family as requested; with discharge instructions to be reviewed with pt and family

## 2017-02-22 NOTE — Plan of Care (Signed)
Pt is returning home in spite of the care team's recommendation that he go to Rehab.  Pt's family refused the seven bed offers made.  PT worked with patient today and found him very deconditioned.  He could barely stand today.  He is getting supplies from [facility] - hospital bed, lift, BSC and will have home health with [facility].  Will also be followed by Palliative.  Pt will go home with foley for urinary retention; also will be on O2.  Pt desatted to 86 at rest.  Pt will transport home with EMS.

## 2017-02-22 NOTE — Progress Notes (Signed)
Speech Language Pathology Treatment: Dysphagia  Patient Details Name: Zachary Peterson MRN: [id number] DOB: 05/02/1931 Today's Date: 02/22/2017 Time: 7616-0737 SLP Time Calculation (min) (ACUTE ONLY): 40 min  Assessment / Plan / Recommendation Clinical Impression  Pt seen for ongoing assessment of toleration of diet; education w/ family when present. Wife and granddaughter present in room. Pt resting in the bed seeming quite fatigued and weak. Family present concerned about pt "not eating much at all at meals". Noted pt has had a Palliative Care consult re: GOC and recommended current family talk w/ others in the family re: pt's [place].    Family and NSG reported pt has consumed trials of thin liquids and few bites of broken down foods (level2) w/ no immediate, overt s/s of aspiration noted; no decline in vocal quality or respiratory status during/post po trials. Oral phase appeared adequate in his oral control and clearing w/ the foods/liquids. Educated pt on the need to take rest breaks during meals; avoid any SOB/WOB when eating and drinking. Discussed w/ family and pt the need for follow through w/ aspiration precautions including Upright Positioning especially when in bed eating meals(pillows down low behind back); NOT using straws to drink liquids if any coughing noted; dysphagia during extended illness and risk for airway compromise thus pulmonary decline; diet consistency for easier oral intake and reducing exertion overall; food options and preparation w/ condiments. Recommended rest breaks to lessen fatigue/SOB as well as allow for Esophageal clearing; and use of Pills in Puree - CRUSHED as needed if necessary for Esophageal clearing. Briefly discussed pt's Esophageal phase dysphagia as noted during prior assessments at [facility] in 01/2017 and the need for precautions and moistened small bites of foods, use small sips of liquids.  Recommend continue w/ current Dysphagia level 2 diet w/ thin  liquids; PILLS IN PUREE - CRUSHED if needed/able. Family agreed; handouts given for information and education. NSG updated and will reconsult if any decline in status while admitted.    HPI HPI: Pt is a 84 y.o. male with a known history of multiple medical issues including COPD, seizures, fatigue, HTN, afib, and UE Tremors (hands) who recently discharged from [facility] for pneumonia January 21, currently in inpatient rehab, since emergency room for shortness of breath, tachycardia, noted hypoxia with O2 saturation in the 80s, diarrhea for the last 1-2 days, in the emergency room patient was tachycardic, tachypneic, hypoxic, patient evaluated in the emergency room, multiple family members at the bedside, chest x-ray noted for multifocal pneumonia, patient is now being admitted for acute multifocal pneumonia-cannot rule out possible aspiration HCAP/given history of dysphagia. Pt was recently hospitalized at [facility] from 01/26/17-02/09/17. During that admission, pt had a Barium study and MBSS performed. Pt exhibited no aspiration of bolus material before/during the swallow but Retrograde bolus activity was noted from the Cervical Esophagus into the Pharynx. ENT/GI evaluations were recommended then. This presentation could greatly increase risk for aspiration of REFLUX material thus Pulmonary compromise from such. Chest CT in 2016 revealed Esophagus is decompressed, esophageal hiatal hernia. Head CT in 2018 revealed Moderate diffuse atrophy is stable. Unsure of pt's baseline Cognitive function. Pt was transferred to CCU post a Rapid Response day after admission d/t decline in medical and respiratory status' and blood pressure. Pt has transferred out of CCU now. Family present in room; stated pt "does not eat much before he gets tired and quits".       SLP Plan  All goals met  Recommendations  Diet recommendations: Dysphagia 2 (fine chop);Thin liquid Liquids provided via: Cup;No straw Medication  Administration: Crushed with puree(or in liquids; powder form) Supervision: Patient able to self feed;Staff to assist with self feeding;Intermittent supervision to cue for compensatory strategies Compensations: Minimize environmental distractions;Slow rate;Small sips/bites;Lingual sweep for clearance of pocketing;Multiple dry swallows after each bite/sip;Follow solids with liquid Postural Changes and/or Swallow Maneuvers: Seated upright 90 degrees;Upright 30-60 min after meal(REFLUX precautions)                General recommendations: (Dietician f/u for drink supplements; GI consult ) Oral Care Recommendations: Oral care BID;Staff/trained caregiver to provide oral care Follow up Recommendations: None SLP Visit Diagnosis: Dysphagia, pharyngoesophageal phase (R13.14)(Esophageal phase dysphagia) Plan: All goals met       GO               Orinda Kenner, MS, CCC-SLP Watson,Katherine 02/22/2017, 1:13 PM

## 2017-02-22 NOTE — Progress Notes (Signed)
EMS arrived to transport patient home. Corrie DandyMary, RN called to notify daughter of transport. Daughter proceeded to notify RN that equipment (hospital bed, lift, bedside commode and oxygen concentrator) had not been delivered; however, two portable tanks are present at patients home. Family attempted to call [facility] with no answer.   AC contacted for guidance.  Nann, CM called. Per CM because hospital bed is not present patient is not allowed to be transported home at this time. This was not communicated to RN or Clinical research associatewriter, CN prior to calling EMS.   EMS cancelled. Family notified by CM to call if equipment is delivered tonight to attempt EMS transport. If not, patient will be discharged tomorrow.   Oncoming RN and CN made aware of above.   Bo McclintockBrewer,Margaret Staggs S, RN 7:54 PM

## 2017-02-22 NOTE — Progress Notes (Signed)
Physical Therapy Treatment Patient Details Name: Zachary Peterson MRN: [id number] DOB: 10/27/1931 Today's Date: 02/22/2017    History of Present Illness 84 y.o. male admitted on 2/8 with HCAP and Afib. After admission to the floor the pt presented with hypotension and was transferred to the ICU.  Following improvement a new PT order was received.  Pt with recent admission at [facility] with acute respiratory distress s/p seizure, intubated 01/26/17-01/28/17. Pt with significant PMH of dysphasia, syncope, seizure, a-fib s/p cardioversion, migraine, HTN, GIB, HOH, COPD, back pain s/p surgery, fatigue and depression.      PT Comments    Zachary Peterson presents with significantly more weakness compared to his initial evaluation.  He puts forth good effort but currently requires mod assist for bed mobility.  He required several attempts to be able to perform almost full standing from bed with max assist to boost up.  SpO2 89-91% on 3L O2 supine in bed at start of session.  Down to 87% on 2L O2 with sit>stand transfer, back up to 90% on 2L O2 after ~30 seconds of seated rest break. Pt unable to attempt sit>stand more than once due to significant fatigue and weakness.  Pt and family have refused SNF, thus recommendation has been updated to home with HHPT with the equipment documented below.   Patient with the above diagnosis/condition which impairs his ability to perform daily activities like toileting, dressing, grooming, bathing in the home. A RW will not resolve the patient's issue with performing activities of daily living. A wheelchair is required/recommended and will allow patient to safely perform daily activities.   Patient can safely propel the wheelchair in the home or has a caregiver who can provide assistance.     Follow Up Recommendations  Home health PT;Supervision/Assistance - 24 hour     Equipment Recommendations  Hospital bed;Wheelchair (measurements PT);Wheelchair cushion (measurements PT);3in1  (PT);Other (comment);Rolling walker with 5" wheels(hoyer lift)    Recommendations for Other Services       Precautions / Restrictions Precautions Precautions: Fall;Other (comment) Precaution Comments: monitor O2, seizure precautions Restrictions Weight Bearing Restrictions: No    Mobility  Bed Mobility Overal bed mobility: Needs Assistance Bed Mobility: Supine to Sit;Sit to Supine     Supine to sit: Mod assist;HOB elevated Sit to supine: Mod assist   General bed mobility comments: Pt requires assist to elevate trunk and to scoot to EOB with use of bed pad as pt was unable to perform without assist due to weakness.  Pt with significantly kyphotic and flexed posture which he is unable to correct (baseline per pt and family).   Transfers Overall transfer level: Needs assistance Equipment used: Rolling walker (2 wheeled) Transfers: Sit to/from Stand Sit to Stand: Max assist         General transfer comment: Pt requries several attempt to achieve sit>stand.  Verbal cues for proper hand and foot placement for safety and greater mechanical advantage.  Pt requires max assist to boost to almost full standing.  Pt with signficantly flexed posture and strong posterior lean.  Pt puts forth effort to correct but is unable to due to weakness and ultimately has to sit after ~10 seconds.  Unable to attempt again after seated rest break due to fatigue and pt's inability to hold himself up in sitting.   Ambulation/Gait             General Gait Details: Pt currently unable   Stairs  Wheelchair Mobility    Modified Rankin (Stroke Patients Only)       Balance Overall balance assessment: Needs assistance Sitting-balance support: Feet supported;Bilateral upper extremity supported Sitting balance-Leahy Scale: Poor Sitting balance - Comments: Pt requires intermittent assist to maintain upright balance Postural control: Posterior lean Standing balance support:  Bilateral upper extremity supported;During functional activity Standing balance-Leahy Scale: Poor Standing balance comment: Pt relies on UE support on RW and outside physical assist to maintain balance with static and dynamic activities                            Cognition Arousal/Alertness: Awake/alert Behavior During Therapy: WFL for tasks assessed/performed Overall Cognitive Status: Within Functional Limits for tasks assessed                                        Exercises General Exercises - Lower Extremity Hip ABduction/ADduction: AROM;Strengthening;Both;5 reps;Supine Straight Leg Raises: AROM;Both;5 reps;Supine;Strengthening    General Comments General comments (skin integrity, edema, etc.): SpO2 89-91% on 3L O2 supine in bed at start of session.  Down to 87% on 2L O2 with sit>stand transfer, back up to 90% on 2L O2 after ~30 seconds of seated rest break.       Pertinent Vitals/Pain Pain Assessment: No/denies pain    Home Living                      Prior Function            PT Goals (current goals can now be found in the care plan section) Acute Rehab PT Goals Patient Stated Goal: to regain independence PT Goal Formulation: With patient Time For Goal Achievement: 03/05/17 Potential to Achieve Goals: Fair Progress towards PT goals: Not progressing toward goals - comment(weakness )    Frequency    Min 2X/week      PT Plan Discharge plan needs to be updated    Co-evaluation              AM-PAC PT "6 Clicks" Daily Activity  Outcome Measure  Difficulty turning over in bed (including adjusting bedclothes, sheets and blankets)?: Unable Difficulty moving from lying on back to sitting on the side of the bed? : Unable Difficulty sitting down on and standing up from a chair with arms (e.g., wheelchair, bedside commode, etc,.)?: Unable Help needed moving to and from a bed to chair (including a wheelchair)?: Total Help  needed walking in hospital room?: Total Help needed climbing 3-5 steps with a railing? : Total 6 Click Score: 6    End of Session Equipment Utilized During Treatment: Gait belt;Oxygen Activity Tolerance: Treatment limited secondary to medical complications (Comment);Patient limited by fatigue(hypoxia) Patient left: with call bell/phone within reach;with family/visitor present;in bed;with bed alarm set;with SCD's reapplied Nurse Communication: Mobility status;Other (comment)(SpO2) PT Visit Diagnosis: Muscle weakness (generalized) (M62.81);Unsteadiness on feet (R26.81);Other abnormalities of gait and mobility (R26.89);Difficulty in walking, not elsewhere classified (R26.2)     Time: [id number] PT Time Calculation (min) (ACUTE ONLY): 37 min  Charges:  $Therapeutic Activity: 23-37 mins                    G Codes:       Encarnacion ChuAshley Abashian PT, DPT 02/22/2017, 11:39 AM

## 2017-02-22 NOTE — Plan of Care (Signed)
I would like to reiterate that I wasn't told to wait until the hospital bed was delivered to call EMS.  In fact, I was encouraged to go ahead and call EMS since they've been running behind today.  I made the call to EMS at 16:28 and they arrived at approx 19:17.  I called the family to let them know he was being picked up and the daughter related that the equipment hadn't been delivered yet.  Wynona DoveShea Brewer, CN called [facility] and we were told that pt could transport home b/c hospital bed wasn't critical and he did have O2 tanks at home.  Osvaldo ShipperNan Greene, CM called and said that he couldn't transport home if the hospital bed hadn't been delivered.  Transport was held until [employer] notified that equipment has been delivered, but it's likely EMS will not be able to transport tonight and d/c will have to wait until tomorrow.

## 2017-02-22 NOTE — Discharge Summary (Signed)
[facility] - May at [facility]   PATIENT NAME: Zachary Peterson    MR#:  [id number]  DATE OF BIRTH:  1931-04-07  DATE OF ADMISSION:  02/15/2017 ADMITTING PHYSICIAN: Evelena Asa Salary, MD  DATE OF DISCHARGE: No discharge date for patient encounter.  PRIMARY CARE PHYSICIAN: Ignacia Palma., MD   ADMISSION DIAGNOSIS:  HCAP (healthcare-associated pneumonia) [J18.9]  DISCHARGE DIAGNOSIS:  Active Problems:   HCAP (healthcare-associated pneumonia)   Protein-calorie malnutrition, severe   Palliative care by specialist   DNR (do not resuscitate)   Weakness generalized   SECONDARY DIAGNOSIS:   Past Medical History:  Diagnosis Date  . Back pain   . COPD (chronic obstructive pulmonary disease) (HCC)   . Depression   . Fatigue    a. in setting of Afib, improved following DCCV.  [name] Hearing loss   . History of GI bleed 1976  . Hyperlipemia   . Hypertension   . Migraine   . Palpitations   . Persistent atrial fibrillation (HCC)    a. 02/2016 Echo: EF 66-65%, mild MR;  b. CHA2DS2VASc = 3-->coumadin;  c. 05/2016 TEE/DCCV: EF 45-50%, triv AI, mild to mod MR, no LA/LAA/RA/RAA thrombus, mod to sev TR-->successful DCCV.  . Seizure (HCC)   . Syncope    a. related to seizure disorder     ADMITTING HISTORY  HISTORY OF PRESENT ILLNESS: Benjiman Peterson  is a 84 y.o. male with a known history per below recently discharged from [facility] for pneumonia January 21, currently in inpatient rehab, since emergency room for shortness of breath, tachycardia, noted hypoxia with O2 saturation in the 80s, diarrhea for the last 1-2 days, in the emergency room patient was tachycardic, tachypneic, hypoxic, patient evaluated in the emergency room, multiple family members at the bedside, chest x-ray noted for multifocal pneumonia, patient is now being admitted for acute multifocal pneumonia-cannot rule out possible aspiration HCAP/given history of dysphagia.  HOSPITAL COURSE:   * Multifocal  healthcare acquired pneumonia vs aspiration PNA and acute on chronic hypoxic respiratory failure MRSA PCR negative.  Was On IV Unasyn. Changed to augmentin Culture NGTD Wean oxygen as tolerated Added flutter valve Patient continues to have shortness of breath, cough.  With his prior history of COPD and recurrent pneumonias he has poor lung reserve at this time.  On 3 L oxygen by the day of discharge.  *Acute COPD exacerbation.  Nebulizers. Will taper steroids after discharge.  * Paroxysmal atrial fibrillation.  On beta-blocker therapy.  Coumadin.  * Severe protein calorie malnutrition. Nutritional supplements  * Acute diarrhea. Resolved. C diff ordered but no further diarrhea.  * Acute toxic and metabolic encephalopathy. Resolved.  * urinary retention Foley placed. D/c with foley to follow up with urology Seen by urologist in the hospital and advised continuing Foley catheter at discharge.  * CKD stage III is stable.  * Anemia of chronic disease  On the day of discharge had long discussion with patient and presents of physical therapist and case manager in the room.  Also discussed with Child psychotherapist.  Total of 7 to skilled nursing facilities have been offered to the family and patient.  Patient has been wanting to go home.  Wife wants to do what the patient has asked for.  His 4 daughters have had disagreement with going to home versus skilled nursing facility.  On the day of discharge he is extremely weak.  Continues to have shortness of breath.  He is definitely hospice appropriate but family  has refused this.  He is high risk for deterioration, readmission.  At this time he seems to have achieved maximal medical therapy in the hospital and can be discharged.  Family has refused all the offered skilled nursing facilities.  The only facility that they wanted to go is unable to offer him a bed. Family is wanting to take the patient home with home health services in spite of  multiple advises that patient needs to be discharged to skilled nursing facility.  We will have palliative care following at home.  Very poor prognosis. DNR.    CONSULTS OBTAINED:  Treatment Team:  Vanna Scotland, MD  DRUG ALLERGIES:   Allergies  Allergen Reactions  . Ativan [Lorazepam] Anaphylaxis  . Aspirin Other (See Comments)    CAUSES ULCERS  . Codeine Nausea And Vomiting  . Oxycodone Nausea And Vomiting    DISCHARGE MEDICATIONS:   Allergies as of 02/22/2017      Reactions   Ativan [lorazepam] Anaphylaxis   Aspirin Other (See Comments)   CAUSES ULCERS   Codeine Nausea And Vomiting   Oxycodone Nausea And Vomiting      Medication List    TAKE these medications   amoxicillin-clavulanate 875-125 MG tablet Commonly known as:  AUGMENTIN Take 1 tablet by mouth every 12 (twelve) hours.   bisoprolol 5 MG tablet Commonly known as:  ZEBETA Take 0.5 tablets (2.5 mg total) by mouth 2 (two) times daily.   calcium carbonate 500 MG chewable tablet Commonly known as:  TUMS - dosed in mg elemental calcium Chew 1 tablet (200 mg of elemental calcium total) by mouth 3 (three) times daily with meals as needed for indigestion or heartburn.   diazepam 5 MG tablet Commonly known as:  VALIUM Take 5 mg by mouth 2 (two) times daily.   ezetimibe-simvastatin 10-80 MG tablet Commonly known as:  VYTORIN Take 1 tablet by mouth daily.   fluticasone 50 MCG/ACT nasal spray Commonly known as:  FLONASE Place 2 sprays into both nostrils daily as needed for allergies.   hydroxypropyl methylcellulose / hypromellose 2.5 % ophthalmic solution Commonly known as:  ISOPTO TEARS / GONIOVISC Place 1 drop into both eyes 3 (three) times daily as needed for dry eyes.   ipratropium-albuterol 0.5-2.5 (3) MG/3ML Soln Commonly known as:  DUONEB Take 3 mLs by nebulization every 6 (six) hours as needed.   lacosamide 200 MG Tabs tablet Commonly known as:  VIMPAT Take 1 tablet (200 mg total) by mouth  2 (two) times daily.   mirtazapine 15 MG tablet Commonly known as:  REMERON Take 15 mg by mouth at bedtime.   ondansetron 8 MG disintegrating tablet Commonly known as:  ZOFRAN ODT Take 1 tablet (8 mg total) by mouth every 8 (eight) hours as needed for nausea or vomiting.   pantoprazole 20 MG tablet Commonly known as:  PROTONIX Take 20 mg by mouth daily.   predniSONE 10 MG (21) Tbpk tablet Commonly known as:  STERAPRED UNI-PAK 21 TAB 6 tabs day 1 and taper 1 tab a day - 6 days   pregabalin 75 MG capsule Commonly known as:  LYRICA Take 75 mg by mouth 2 (two) times daily.   tamsulosin 0.4 MG Caps capsule Commonly known as:  FLOMAX Take 2 capsules (0.8 mg total) by mouth daily after breakfast.   tiotropium 18 MCG inhalation capsule Commonly known as:  SPIRIVA Place 18 mcg into inhaler and inhale daily.   warfarin 2.5 MG tablet Commonly known as:  COUMADIN Take  as directed. If you are unsure how to take this medication, talk to your nurse or doctor. Original instructions:  TAKE 1 OR 2 TABLETS BY MOUTH EVERY DAY OR AS PHYSICIAN INSTRUCTED BY COUMADIN CLINIC What changed:  See the new instructions.            Durable Medical Equipment  (From admission, onward)        Start     Ordered   02/22/17 1528  For home use only DME oxygen  Once    Comments:  COPD  Question Answer Comment  Mode or (Route) Nasal cannula   Liters per Minute 3   Frequency Continuous (stationary and portable oxygen unit needed)   Oxygen delivery system Gas      02/22/17 1527   02/22/17 1343  For home use only DME Hospital bed  Once    Question Answer Comment  Patient has (list medical condition): Dysphagia, copd   The above medical condition requires: Patient requires the ability to reposition frequently   Head must be elevated greater than: 30 degrees   Bed type Semi-electric   Hoyer Lift Yes      02/22/17 1342   02/22/17 1017  For home use only DME Bedside commode  Once    Question:   Patient needs a bedside commode to treat with the following condition  Answer:  Debility   02/22/17 1017   02/22/17 1009  For home use only DME Nebulizer machine  Once    Question:  Patient needs a nebulizer to treat with the following condition  Answer:  COPD (chronic obstructive pulmonary disease) (HCC)   02/22/17 1008   02/22/17 1008  For home use only DME Walker rolling  Once    Question:  Patient needs a walker to treat with the following condition  Answer:  Osteoarthritis   02/22/17 1008      Today   VITAL SIGNS:  Blood pressure (!) 109/40, pulse 91, temperature 98.1 F (36.7 C), temperature source Oral, resp. rate 20, height 5\' 9"  (1.753 m), weight 63.1 kg (139 lb 1.8 oz), SpO2 93 %.  I/O:    Intake/Output Summary (Last 24 hours) at 02/22/2017 1538 Last data filed at 02/21/2017 2039 Gross per 24 hour  Intake 0 ml  Output 1750 ml  Net -1750 ml    PHYSICAL EXAMINATION:  Physical Exam  GENERAL:  84 y.o.-year-old patient lying in the bed  LUNGS: Bilateral mild wheezing CARDIOVASCULAR: S1, S2 normal. No murmurs, rubs, or gallops.  ABDOMEN: Soft, non-tender, non-distended. Bowel sounds present. No organomegaly or mass.  NEUROLOGIC: Moves all 4 extremities. PSYCHIATRIC: The patient is alert and awake SKIN: No obvious rash, lesion, or ulcer.   DATA REVIEW:   CBC Recent Labs  Lab 02/18/17 0445  WBC 3.9  HGB 9.1*  HCT 27.0*  PLT 330    Chemistries  Recent Labs  Lab 02/18/17 0445 02/21/17 1545  NA 135 134*  K 4.2 4.3  CL 103 101  CO2 24 24  GLUCOSE 189* 112*  BUN 15 17  CREATININE 1.04 1.01  CALCIUM 7.6* 8.0*  MG 1.7  --     Cardiac Enzymes Recent Labs  Lab 02/16/17 1632  TROPONINI <0.03    Microbiology Results  Results for orders placed or performed during the hospital encounter of 02/15/17  Culture, blood (routine x 2)     Status: None   Collection Time: 02/15/17 11:10 AM  Result Value Ref Range Status   Specimen Description BLOOD BLOOD  LEFT  HAND  Final   Special Requests   Final    BOTTLES DRAWN AEROBIC AND ANAEROBIC Blood Culture adequate volume   Culture   Final    NO GROWTH 5 DAYS Performed at [facility], [address] [place], [place] [address]    Report Status 02/20/2017 FINAL  Final  Culture, blood (routine x 2)     Status: None   Collection Time: 02/15/17 11:39 AM  Result Value Ref Range Status   Specimen Description BLOOD Blood Culture adequate volume  Final   Special Requests BLOOD LEFT FOREARM  Final   Culture   Final    NO GROWTH 5 DAYS Performed at [facility], [address] [place], [place] [address]    Report Status 02/20/2017 FINAL  Final  MRSA PCR Screening     Status: None   Collection Time: 02/15/17  4:34 PM  Result Value Ref Range Status   MRSA by PCR NEGATIVE NEGATIVE Final    Comment:        The GeneXpert MRSA Assay (FDA approved for NASAL specimens only), is one component of a comprehensive MRSA colonization surveillance program. It is not intended to diagnose MRSA infection nor to guide or monitor treatment for MRSA infections. Performed at [facility], [address], [place], [place] [address]     RADIOLOGY:  No results found.  Follow up with PCP in 1 week.  Management plans discussed with the patient, family and they are in agreement.  CODE STATUS:     Code Status Orders  (From admission, onward)        Start     Ordered   02/16/17 1550  Do not attempt resuscitation (DNR)  Continuous    Question Answer Comment  In the event of cardiac or respiratory ARREST Do not call a "code blue"   In the event of cardiac or respiratory ARREST Do not perform Intubation, CPR, defibrillation or ACLS   In the event of cardiac or respiratory ARREST Use medication by any route, position, wound care, and other measures to relive pain and suffering. May use oxygen, suction and manual treatment of airway obstruction as needed for comfort.      02/16/17  1549    Code Status History    Date Active Date Inactive Code Status Order ID Comments User Context   02/15/2017 15:45 02/16/2017 15:49 Partial Code [id number]  Bertrum Sol, MD Inpatient   01/27/2017 00:05 02/09/2017 16:41 Full Code [id number]  Tobey Grim, NP ED   02/20/2016 21:15 02/24/2016 19:38 Full Code [id number]  Auburn Bilberry, MD Inpatient    Advance Directive Documentation     Most Recent Value  Type of Advance Directive  Out of facility DNR (pink MOST or yellow form)  Pre-existing out of facility DNR order (yellow form or pink MOST form)  Pink MOST form placed in chart (order not valid for inpatient use)  "MOST" Form in Place?  No data      TOTAL TIME TAKING CARE OF THIS PATIENT ON DAY OF DISCHARGE: more than 30 minutes.   Orie Fisherman M.D on 02/22/2017 at 3:38 PM  Between 7am to 6pm - Pager - [phone]  After 6pm go to [email or web address] - password [email or web address]  [facility]  Office  [phone]  CC: Primary care physician; Ignacia Palma., MD  Note: This dictation was prepared with Dragon dictation along with smaller phrase technology. Any transcriptional errors that result from this process  are unintentional.

## 2017-02-22 NOTE — Care Management (Signed)
CM received a called from [name], patient's daughter stating has been notified by unit that ems has arrived to transport patient home and hospital bed has not arrived. CM contacted unit and spoke with Lance BoschShea. Informed that patient has 2 portable oxygen tanks at home and per nursing supervisor having the hospital bed in place prior to transport was not medically necessary.  Was informed that ems had arrived to transport and if they did not take the patient at that time, then it would have to wait possibly until tomorrow as transport trucks limited. CM explained that for this patient, having the  bed in place prior to arrival was medically necessary.  This had been communicated with family and staff throughout the afternoon.  CM spoke with nursing staff around 6:15p family was still awaiting arrival of the equipment. Delivery time was to have been between 2-6pm.  Family that was still in the room would go on to the residence while waiting for the equipment and unit staff informed.  CM spoke with nursing supervisor Annice PihJackie and relayed  the circumstances relating to this complicated discharge and for patient safety, the bed must be in place before patient arrived home.  CM communicated to unit and Agustin CreeDarlene (daughter) that when bed arrives, family will call unit.  EMS will be called and if they can or cannot take the patient -  unit will notify family.  CM updated [facility]

## 2017-02-23 LAB — PROTIME-INR
INR: 1.59
Prothrombin Time: 18.8 seconds — ABNORMAL HIGH (ref 11.4–15.2)

## 2017-02-23 MED ORDER — IPRATROPIUM-ALBUTEROL 0.5-2.5 (3) MG/3ML IN SOLN: 3.0000 mL | Freq: Four times a day (QID) | RESPIRATORY_TRACT | Status: DC | PRN

## 2017-02-23 NOTE — Plan of Care (Signed)
No distress noted at rest.  O[facility] at [facility].

## 2017-02-23 NOTE — Progress Notes (Signed)
Pt discharged via [facility]-emergent [facility] to home. Family called and updated. Pt on 3 L and in no distress. Pt discharging with chronic Foley. Otilio JeffersonMadelyn S Fenton, RN

## 2017-02-24 ENCOUNTER — Other Ambulatory Visit: Payer: Self-pay

## 2017-02-24 ENCOUNTER — Encounter (HOSPITAL_COMMUNITY): Payer: Self-pay | Admitting: Emergency Medicine

## 2017-02-24 ENCOUNTER — Observation Stay (HOSPITAL_COMMUNITY): Payer: Medicare HMO

## 2017-02-24 ENCOUNTER — Emergency Department (HOSPITAL_COMMUNITY): Payer: Medicare HMO

## 2017-02-24 ENCOUNTER — Inpatient Hospital Stay (HOSPITAL_COMMUNITY)
Admission: EM | Admit: 2017-02-24 | Discharge: 2017-02-28 | DRG: 196 | Disposition: A | Discharge status: Hospice | Payer: Medicare HMO | Attending: Internal Medicine | Admitting: Internal Medicine

## 2017-02-24 DIAGNOSIS — I5022 Chronic systolic (congestive) heart failure: Secondary | ICD-10-CM | POA: Diagnosis present

## 2017-02-24 DIAGNOSIS — Z888 Allergy status to other drugs, medicaments and biological substances status: Secondary | ICD-10-CM

## 2017-02-24 DIAGNOSIS — M549 Dorsalgia, unspecified: Secondary | ICD-10-CM | POA: Diagnosis present

## 2017-02-24 DIAGNOSIS — G43909 Migraine, unspecified, not intractable, without status migrainosus: Secondary | ICD-10-CM | POA: Diagnosis present

## 2017-02-24 DIAGNOSIS — E43 Unspecified severe protein-calorie malnutrition: Secondary | ICD-10-CM | POA: Diagnosis present

## 2017-02-24 DIAGNOSIS — J189 Pneumonia, unspecified organism: Secondary | ICD-10-CM | POA: Diagnosis not present

## 2017-02-24 DIAGNOSIS — Z87891 Personal history of nicotine dependence: Secondary | ICD-10-CM

## 2017-02-24 DIAGNOSIS — D638 Anemia in other chronic diseases classified elsewhere: Secondary | ICD-10-CM | POA: Diagnosis present

## 2017-02-24 DIAGNOSIS — A4152 Sepsis due to Pseudomonas: Secondary | ICD-10-CM

## 2017-02-24 DIAGNOSIS — Z515 Encounter for palliative care: Secondary | ICD-10-CM

## 2017-02-24 DIAGNOSIS — E785 Hyperlipidemia, unspecified: Secondary | ICD-10-CM | POA: Diagnosis present

## 2017-02-24 DIAGNOSIS — J449 Chronic obstructive pulmonary disease, unspecified: Secondary | ICD-10-CM | POA: Diagnosis present

## 2017-02-24 DIAGNOSIS — Z7901 Long term (current) use of anticoagulants: Secondary | ICD-10-CM

## 2017-02-24 DIAGNOSIS — Z6821 Body mass index (BMI) 21.0-21.9, adult: Secondary | ICD-10-CM

## 2017-02-24 DIAGNOSIS — N390 Urinary tract infection, site not specified: Secondary | ICD-10-CM | POA: Diagnosis present

## 2017-02-24 DIAGNOSIS — J440 Chronic obstructive pulmonary disease with acute lower respiratory infection: Secondary | ICD-10-CM | POA: Diagnosis present

## 2017-02-24 DIAGNOSIS — J84116 Cryptogenic organizing pneumonia: Principal | ICD-10-CM | POA: Diagnosis present

## 2017-02-24 DIAGNOSIS — R627 Adult failure to thrive: Secondary | ICD-10-CM | POA: Diagnosis not present

## 2017-02-24 DIAGNOSIS — B9689 Other specified bacterial agents as the cause of diseases classified elsewhere: Secondary | ICD-10-CM | POA: Diagnosis present

## 2017-02-24 DIAGNOSIS — I071 Rheumatic tricuspid insufficiency: Secondary | ICD-10-CM | POA: Diagnosis present

## 2017-02-24 DIAGNOSIS — J101 Influenza due to other identified influenza virus with other respiratory manifestations: Secondary | ICD-10-CM | POA: Diagnosis present

## 2017-02-24 DIAGNOSIS — Z886 Allergy status to analgesic agent status: Secondary | ICD-10-CM

## 2017-02-24 DIAGNOSIS — R0902 Hypoxemia: Secondary | ICD-10-CM

## 2017-02-24 DIAGNOSIS — R6510 Systemic inflammatory response syndrome (SIRS) of non-infectious origin without acute organ dysfunction: Secondary | ICD-10-CM

## 2017-02-24 DIAGNOSIS — Z9981 Dependence on supplemental oxygen: Secondary | ICD-10-CM

## 2017-02-24 DIAGNOSIS — I10 Essential (primary) hypertension: Secondary | ICD-10-CM | POA: Diagnosis not present

## 2017-02-24 DIAGNOSIS — Y846 Urinary catheterization as the cause of abnormal reaction of the patient, or of later complication, without mention of misadventure at the time of the procedure: Secondary | ICD-10-CM | POA: Diagnosis present

## 2017-02-24 DIAGNOSIS — Z885 Allergy status to narcotic agent status: Secondary | ICD-10-CM

## 2017-02-24 DIAGNOSIS — R569 Unspecified convulsions: Secondary | ICD-10-CM | POA: Diagnosis not present

## 2017-02-24 DIAGNOSIS — H9190 Unspecified hearing loss, unspecified ear: Secondary | ICD-10-CM | POA: Diagnosis present

## 2017-02-24 DIAGNOSIS — R9389 Abnormal findings on diagnostic imaging of other specified body structures: Secondary | ICD-10-CM

## 2017-02-24 DIAGNOSIS — Z66 Do not resuscitate: Secondary | ICD-10-CM | POA: Diagnosis present

## 2017-02-24 DIAGNOSIS — I110 Hypertensive heart disease with heart failure: Secondary | ICD-10-CM | POA: Diagnosis present

## 2017-02-24 DIAGNOSIS — G40909 Epilepsy, unspecified, not intractable, without status epilepticus: Secondary | ICD-10-CM | POA: Diagnosis present

## 2017-02-24 DIAGNOSIS — J441 Chronic obstructive pulmonary disease with (acute) exacerbation: Secondary | ICD-10-CM | POA: Diagnosis present

## 2017-02-24 DIAGNOSIS — I480 Paroxysmal atrial fibrillation: Secondary | ICD-10-CM | POA: Diagnosis not present

## 2017-02-24 DIAGNOSIS — R339 Retention of urine, unspecified: Secondary | ICD-10-CM | POA: Diagnosis present

## 2017-02-24 DIAGNOSIS — E861 Hypovolemia: Secondary | ICD-10-CM | POA: Diagnosis present

## 2017-02-24 DIAGNOSIS — B965 Pseudomonas (aeruginosa) (mallei) (pseudomallei) as the cause of diseases classified elsewhere: Secondary | ICD-10-CM | POA: Diagnosis present

## 2017-02-24 DIAGNOSIS — F329 Major depressive disorder, single episode, unspecified: Secondary | ICD-10-CM | POA: Diagnosis present

## 2017-02-24 DIAGNOSIS — R402414 Glasgow coma scale score 13-15, 24 hours or more after hospital admission: Secondary | ICD-10-CM | POA: Diagnosis not present

## 2017-02-24 DIAGNOSIS — R7881 Bacteremia: Secondary | ICD-10-CM | POA: Diagnosis present

## 2017-02-24 DIAGNOSIS — I481 Persistent atrial fibrillation: Secondary | ICD-10-CM | POA: Diagnosis present

## 2017-02-24 DIAGNOSIS — T83511A Infection and inflammatory reaction due to indwelling urethral catheter, initial encounter: Secondary | ICD-10-CM | POA: Diagnosis present

## 2017-02-24 DIAGNOSIS — R64 Cachexia: Secondary | ICD-10-CM | POA: Diagnosis present

## 2017-02-24 DIAGNOSIS — Z6820 Body mass index (BMI) 20.0-20.9, adult: Secondary | ICD-10-CM

## 2017-02-24 DIAGNOSIS — Y95 Nosocomial condition: Secondary | ICD-10-CM | POA: Diagnosis present

## 2017-02-24 HISTORY — DX: Pneumonia, unspecified organism: J18.9

## 2017-02-24 LAB — CBC WITH DIFFERENTIAL/PLATELET
Basophils Absolute: 0 10*3/uL (ref 0.0–0.1)
Basophils Relative: 0 %
EOS PCT: 0 %
Eosinophils Absolute: 0 10*3/uL (ref 0.0–0.7)
HCT: 35.1 % — ABNORMAL LOW (ref 39.0–52.0)
Hemoglobin: 11.5 g/dL — ABNORMAL LOW (ref 13.0–17.0)
LYMPHS PCT: 5 %
Lymphs Abs: 1.3 10*3/uL (ref 0.7–4.0)
MCH: 31.1 pg (ref 26.0–34.0)
MCHC: 32.8 g/dL (ref 30.0–36.0)
MCV: 94.9 fL (ref 78.0–100.0)
MONO ABS: 1.2 10*3/uL — AB (ref 0.1–1.0)
MONOS PCT: 5 %
Neutro Abs: 21.9 10*3/uL — ABNORMAL HIGH (ref 1.7–7.7)
Neutrophils Relative %: 90 %
PLATELETS: 240 10*3/uL (ref 150–400)
RBC: 3.7 MIL/uL — ABNORMAL LOW (ref 4.22–5.81)
RDW: 15.4 % (ref 11.5–15.5)
WBC: 24.4 10*3/uL — ABNORMAL HIGH (ref 4.0–10.5)

## 2017-02-24 LAB — URINALYSIS, ROUTINE W REFLEX MICROSCOPIC
Bilirubin Urine: NEGATIVE
Glucose, UA: NEGATIVE mg/dL
KETONES UR: NEGATIVE mg/dL
Nitrite: NEGATIVE
PROTEIN: 100 mg/dL — AB
SQUAMOUS EPITHELIAL / LPF: NONE SEEN
Specific Gravity, Urine: 1.026 (ref 1.005–1.030)
pH: 6 (ref 5.0–8.0)

## 2017-02-24 LAB — BASIC METABOLIC PANEL
Anion gap: 10 (ref 5–15)
BUN: 22 mg/dL — AB (ref 6–20)
CALCIUM: 8.5 mg/dL — AB (ref 8.9–10.3)
CO2: 22 mmol/L (ref 22–32)
CREATININE: 1.15 mg/dL (ref 0.61–1.24)
Chloride: 104 mmol/L (ref 101–111)
GFR calc Af Amer: >60 mL/min (ref >60)
GFR, EST NON AFRICAN AMERICAN: 57 mL/min — AB (ref >60)
GLUCOSE: 148 mg/dL — AB (ref 65–99)
Potassium: 4.4 mmol/L (ref 3.5–5.1)
Sodium: 136 mmol/L (ref 135–145)

## 2017-02-24 LAB — INFLUENZA PANEL BY PCR (TYPE A & B)
Influenza A By PCR: POSITIVE — AB
Influenza B By PCR: NEGATIVE

## 2017-02-24 LAB — I-STAT CG4 LACTIC ACID, ED: Lactic Acid, Venous: 1.17 mmol/L (ref 0.5–1.9)

## 2017-02-24 LAB — STREP PNEUMONIAE URINARY ANTIGEN: Strep Pneumo Urinary Antigen: NEGATIVE

## 2017-02-24 LAB — PROCALCITONIN: PROCALCITONIN: 0.88 ng/mL

## 2017-02-24 MED ORDER — ACETAMINOPHEN 650 MG RE SUPP: 650.0000 mg | Freq: Four times a day (QID) | RECTAL | Status: DC | PRN

## 2017-02-24 MED ORDER — WARFARIN - PHARMACIST DOSING INPATIENT: Freq: Every day | Status: DC

## 2017-02-24 MED ORDER — PREGABALIN 75 MG PO CAPS
75.0000 mg | ORAL_CAPSULE | Freq: Two times a day (BID) | ORAL | Status: DC
  Administered 2017-02-25 – 2017-02-28 (×8): 75 mg via ORAL
  Filled 2017-02-24 (×2): qty 1
  Filled 2017-02-24: qty 3
  Filled 2017-02-24 (×4): qty 1
  Filled 2017-02-24: qty 3

## 2017-02-24 MED ORDER — LACTATED RINGERS IV BOLUS (SEPSIS)
1000.0000 mL | Freq: Once | INTRAVENOUS | Status: AC
  Administered 2017-02-24: 1000 mL via INTRAVENOUS

## 2017-02-24 MED ORDER — BISOPROLOL FUMARATE 5 MG PO TABS
2.5000 mg | ORAL_TABLET | Freq: Two times a day (BID) | ORAL | Status: DC
  Administered 2017-02-25 – 2017-02-28 (×7): 2.5 mg via ORAL
  Filled 2017-02-24 (×7): qty 0.5

## 2017-02-24 MED ORDER — VANCOMYCIN HCL IN DEXTROSE 1-5 GM/200ML-% IV SOLN
1000.0000 mg | Freq: Once | INTRAVENOUS | Status: AC
  Administered 2017-02-24: 1000 mg via INTRAVENOUS
  Filled 2017-02-24: qty 200

## 2017-02-24 MED ORDER — EZETIMIBE-SIMVASTATIN 10-80 MG PO TABS
1.0000 | ORAL_TABLET | Freq: Every day | ORAL | Status: DC
  Administered 2017-02-25 – 2017-02-27 (×3): 1 via ORAL
  Filled 2017-02-24 (×5): qty 1

## 2017-02-24 MED ORDER — TAMSULOSIN HCL 0.4 MG PO CAPS
0.8000 mg | ORAL_CAPSULE | Freq: Every day | ORAL | Status: DC
  Administered 2017-02-25 – 2017-02-28 (×4): 0.8 mg via ORAL
  Filled 2017-02-24 (×4): qty 2

## 2017-02-24 MED ORDER — SENNOSIDES-DOCUSATE SODIUM 8.6-50 MG PO TABS: 1.0000 | ORAL_TABLET | Freq: Every evening | ORAL | Status: DC | PRN

## 2017-02-24 MED ORDER — IOPAMIDOL (ISOVUE-300) INJECTION 61%
INTRAVENOUS | Status: AC
  Administered 2017-02-24: 75 mL
  Filled 2017-02-24: qty 75

## 2017-02-24 MED ORDER — CALCIUM CARBONATE ANTACID 500 MG PO CHEW
1.0000 | CHEWABLE_TABLET | Freq: Three times a day (TID) | ORAL | Status: DC | PRN
  Administered 2017-02-25: 200 mg via ORAL
  Filled 2017-02-24 (×2): qty 1

## 2017-02-24 MED ORDER — BISACODYL 5 MG PO TBEC: 5.0000 mg | DELAYED_RELEASE_TABLET | Freq: Every day | ORAL | Status: DC | PRN

## 2017-02-24 MED ORDER — FLUTICASONE PROPIONATE 50 MCG/ACT NA SUSP
2.0000 | Freq: Every day | NASAL | Status: DC | PRN
  Filled 2017-02-24: qty 16

## 2017-02-24 MED ORDER — HYPROMELLOSE (GONIOSCOPIC) 2.5 % OP SOLN: 1.0000 [drp] | Freq: Three times a day (TID) | OPHTHALMIC | Status: DC | PRN

## 2017-02-24 MED ORDER — IPRATROPIUM-ALBUTEROL 0.5-2.5 (3) MG/3ML IN SOLN: 3.0000 mL | RESPIRATORY_TRACT | Status: DC | PRN

## 2017-02-24 MED ORDER — SODIUM CHLORIDE 0.9 % IV SOLN
500.0000 mg | Freq: Two times a day (BID) | INTRAVENOUS | Status: DC
  Administered 2017-02-25: 500 mg via INTRAVENOUS
  Filled 2017-02-24: qty 500

## 2017-02-24 MED ORDER — SODIUM CHLORIDE 0.9 % IV SOLN
2.0000 g | Freq: Once | INTRAVENOUS | Status: AC
  Administered 2017-02-24: 2 g via INTRAVENOUS
  Filled 2017-02-24: qty 2

## 2017-02-24 MED ORDER — ONDANSETRON HCL 4 MG/2ML IJ SOLN: 4.0000 mg | Freq: Four times a day (QID) | INTRAMUSCULAR | Status: DC | PRN

## 2017-02-24 MED ORDER — TIOTROPIUM BROMIDE MONOHYDRATE 18 MCG IN CAPS
18.0000 ug | ORAL_CAPSULE | Freq: Every day | RESPIRATORY_TRACT | Status: DC
  Administered 2017-02-25 – 2017-02-28 (×4): 18 ug via RESPIRATORY_TRACT
  Filled 2017-02-24: qty 5

## 2017-02-24 MED ORDER — PANTOPRAZOLE SODIUM 20 MG PO TBEC
20.0000 mg | DELAYED_RELEASE_TABLET | Freq: Every day | ORAL | Status: DC
  Administered 2017-02-26 – 2017-02-28 (×3): 20 mg via ORAL
  Filled 2017-02-24 (×6): qty 1

## 2017-02-24 MED ORDER — ONDANSETRON HCL 4 MG PO TABS: 4.0000 mg | ORAL_TABLET | Freq: Four times a day (QID) | ORAL | Status: DC | PRN

## 2017-02-24 MED ORDER — DIAZEPAM 5 MG PO TABS
5.0000 mg | ORAL_TABLET | Freq: Two times a day (BID) | ORAL | Status: DC | PRN
Start: 2017-02-24 — End: 2017-02-27
  Administered 2017-02-26 – 2017-02-27 (×2): 5 mg via ORAL
  Filled 2017-02-24 (×2): qty 1

## 2017-02-24 MED ORDER — LACOSAMIDE 200 MG PO TABS
200.0000 mg | ORAL_TABLET | Freq: Two times a day (BID) | ORAL | Status: DC
  Administered 2017-02-25 – 2017-02-28 (×7): 200 mg via ORAL
  Filled 2017-02-24: qty 4
  Filled 2017-02-24 (×6): qty 1

## 2017-02-24 MED ORDER — HYDROCODONE-ACETAMINOPHEN 5-325 MG PO TABS: 1.0000 | ORAL_TABLET | ORAL | Status: DC | PRN

## 2017-02-24 MED ORDER — CEFEPIME HCL 1 G IJ SOLR
1.0000 g | Freq: Two times a day (BID) | INTRAMUSCULAR | Status: DC
  Administered 2017-02-25 – 2017-02-26 (×3): 1 g via INTRAVENOUS
  Filled 2017-02-24 (×3): qty 1

## 2017-02-24 MED ORDER — MIRTAZAPINE 15 MG PO TABS
15.0000 mg | ORAL_TABLET | Freq: Every day | ORAL | Status: DC
  Administered 2017-02-25 – 2017-02-27 (×4): 15 mg via ORAL
  Filled 2017-02-24 (×5): qty 1

## 2017-02-24 MED ORDER — ACETAMINOPHEN 325 MG PO TABS: 650.0000 mg | ORAL_TABLET | Freq: Four times a day (QID) | ORAL | Status: DC | PRN

## 2017-02-24 MED ORDER — SODIUM CHLORIDE 0.9 % IV SOLN
INTRAVENOUS | Status: AC
  Administered 2017-02-24: 22:00:00 via INTRAVENOUS

## 2017-02-24 NOTE — ED Notes (Signed)
Sent label to main lab to add on pct blood test.

## 2017-02-24 NOTE — ED Provider Notes (Signed)
[facility] EMERGENCY DEPARTMENT Provider Note   CSN: [id number] Arrival date & time: 02/24/17  1554     History   Chief Complaint Chief Complaint  Patient presents with  . Altered Mental Status    HPI Zachary Peterson is a 84 y.o. male.  HPI  85 year old male with history of COPD hypertension, hyperlipidemia, seizure disorder, persistent A. fib who comes into the ER with chief complaint of altered mental status and cough.    Patient is oriented x 2, and has no complaints.  According to patient's wife and his daughter patient was discharged from [facility] yesterday, and had home nurse evaluation done today.  Home nurse felt that patient needed more antibiotics for pneumonia, and that he might have developed a new urinary tract infection.   Patient has had 2 admissions in the last 6 weeks for pneumonia.  Initially patient had aspiration pneumonia, was discharged to a rehab, where patient ended up with bilateral pneumonia.  Patient was just discharged yesterday, during the hospital stay patient was in the ICU, and had IV antibiotics for prolonged period of time.  It also appears that patient did not want to go to rehab despite medical teams recommendation, therefore he was discharged home with home health.  Past Medical History:  Diagnosis Date  . Back pain   . COPD (chronic obstructive pulmonary disease) (HCC)   . Depression   . Fatigue    a. in setting of Afib, improved following DCCV.  [name] Hearing loss   . History of GI bleed 1976  . Hyperlipemia   . Hypertension   . Migraine   . Palpitations   . Persistent atrial fibrillation (HCC)    a. 02/2016 Echo: EF 66-65%, mild MR;  b. CHA2DS2VASc = 3-->coumadin;  c. 05/2016 TEE/DCCV: EF 45-50%, triv AI, mild to mod MR, no LA/LAA/RA/RAA thrombus, mod to sev TR-->successful DCCV.  [name] Pneumonia   . Seizure (HCC)   . Syncope    a. related to seizure disorder    Patient Active Problem List   Diagnosis Date Noted   . Palliative care by specialist   . DNR (do not resuscitate)   . Weakness generalized   . Protein-calorie malnutrition, severe 02/18/2017  . HCAP (healthcare-associated pneumonia) 02/15/2017  . Acute respiratory failure with hypoxia (HCC)   . Dysphagia   . Chronic obstructive pulmonary disease (HCC)   . PAF (paroxysmal atrial fibrillation) (HCC)   . Benign essential HTN   . Hypophosphatemia   . Leukocytosis   . Acute blood loss anemia   . Aspiration pneumonia (HCC) 01/27/2017  . Endotracheally intubated 01/27/2017  . Acute hypercapnic respiratory failure (HCC)   . At high risk for aspiration   . (HFpEF) heart failure with preserved ejection fraction (HCC) 08/29/2016  . Persistent atrial fibrillation (HCC) 06/06/2016  . Encounter for therapeutic drug monitoring 03/14/2016  . Difficulty in walking, not elsewhere classified   . Muscle weakness (generalized)   . Diarrhea   . Seizure (HCC) 02/21/2016  . Atrial fibrillation with RVR (HCC) 02/21/2016  . Viral gastroenteritis 02/21/2016  . Hypokalemia 02/21/2016  . Demand ischemia of myocardium (HCC) 02/21/2016  . Essential hypertension 02/21/2016    Past Surgical History:  Procedure Laterality Date  . APPENDECTOMY    . BACK SURGERY    . CARDIOVERSION N/A 06/06/2016   Procedure: CARDIOVERSION;  Surgeon: Yvonne Kendall, MD;  Location: [facility] ORS;  Service: Cardiovascular;  Laterality: N/A;  . CATARACT EXTRACTION    .  hemmoridectomy    . HERNIA REPAIR    . TEE WITHOUT CARDIOVERSION N/A 06/06/2016   Procedure: TRANSESOPHAGEAL ECHOCARDIOGRAM (TEE);  Surgeon: Yvonne Kendall, MD;  Location: [facility] ORS;  Service: Cardiovascular;  Laterality: N/A;       Home Medications    Prior to Admission medications   Medication Sig Start Date End Date Taking? Authorizing Provider  amoxicillin-clavulanate (AUGMENTIN) 875-125 MG tablet Take 1 tablet by mouth every 12 (twelve) hours. 02/21/17  Yes Sudini, Wardell Heath, MD  bisoprolol (ZEBETA) 5 MG  tablet Take 0.5 tablets (2.5 mg total) by mouth 2 (two) times daily. 05/11/16  Yes End, Cristal Deer, MD  calcium carbonate (TUMS - DOSED IN MG ELEMENTAL CALCIUM) 500 MG chewable tablet Chew 1 tablet (200 mg of elemental calcium total) by mouth 3 (three) times daily with meals as needed for indigestion or heartburn. 02/24/16  Yes Gouru, Deanna Artis, MD  diazepam (VALIUM) 5 MG tablet Take 5 mg by mouth 2 (two) times daily.   Yes [provider]  ezetimibe-simvastatin (VYTORIN) 10-80 MG tablet Take 1 tablet by mouth daily.   Yes [provider]  fluticasone (FLONASE) 50 MCG/ACT nasal spray Place 2 sprays into both nostrils daily as needed for allergies.    Yes [provider]  hydroxypropyl methylcellulose / hypromellose (ISOPTO TEARS / GONIOVISC) 2.5 % ophthalmic solution Place 1 drop into both eyes 3 (three) times daily as needed for dry eyes.   Yes [provider]  ipratropium-albuterol (DUONEB) 0.5-2.5 (3) MG/3ML SOLN Take 3 mLs by nebulization every 6 (six) hours as needed. 02/21/17  Yes Sudini, Wardell Heath, MD  lacosamide (VIMPAT) 200 MG TABS tablet Take 1 tablet (200 mg total) by mouth 2 (two) times daily. 02/22/17  Yes Sudini, Wardell Heath, MD  mirtazapine (REMERON) 15 MG tablet Take 15 mg by mouth at bedtime.   Yes [provider]  ondansetron (ZOFRAN ODT) 8 MG disintegrating tablet Take 1 tablet (8 mg total) by mouth every 8 (eight) hours as needed for nausea or vomiting. 04/13/14  Yes Kirichenko, Tatyana, PA-C  pantoprazole (PROTONIX) 20 MG tablet Take 20 mg by mouth daily.   Yes [provider]  predniSONE (STERAPRED UNI-PAK 21 TAB) 10 MG (21) TBPK tablet 6 tabs day 1 and taper 1 tab a day - 6 days 02/22/17  Yes Sudini, Srikar, MD  pregabalin (LYRICA) 75 MG capsule Take 75 mg by mouth 2 (two) times daily.   Yes [provider]  tamsulosin (FLOMAX) 0.4 MG CAPS capsule Take 2 capsules (0.8 mg total) by mouth daily after breakfast. 02/10/17  Yes Penny Pia,  MD  tiotropium (SPIRIVA) 18 MCG inhalation capsule Place 18 mcg into inhaler and inhale daily.   Yes [provider]  warfarin (COUMADIN) 2.5 MG tablet Take 5 mg by mouth daily.   Yes [provider]  warfarin (COUMADIN) 2.5 MG tablet TAKE 1 OR 2 TABLETS BY MOUTH EVERY DAY OR AS PHYSICIAN INSTRUCTED BY COUMADIN CLINIC Patient not taking: Reported on 02/24/2017 09/25/16   End, Cristal Deer, MD    Family History Family History  Problem Relation Age of Onset  . Heart disease Mother   . Hypertension Father     Social History Social History   Tobacco Use  . Smoking status: Former Games developer  . Smokeless tobacco: Former Engineer, water Use Topics  . Alcohol use: No  . Drug use: No     Allergies   Ativan [lorazepam]; Aspirin; Codeine; and Oxycodone   Review of Systems Review of Systems  Constitutional: Negative for activity change.  Respiratory: Positive for cough. Negative for shortness of breath.   Cardiovascular: Negative for chest pain.  Gastrointestinal: Negative for abdominal pain.  Skin: Negative for rash.  All other systems reviewed and are negative.    Physical Exam Updated Vital Signs BP (!) 113/56   Pulse 82   Temp 99.3 F (37.4 C) (Rectal)   Resp 16   Ht 5\' 9"  (1.753 m)   Wt 63 kg (139 lb)   SpO2 96%   BMI 20.53 kg/m   Physical Exam  Constitutional: He appears well-developed.  Cachectic patient  HENT:  Head: Atraumatic.  Eyes: EOM are normal.  Neck: Neck supple.  Cardiovascular: Normal rate.  Pulmonary/Chest: Effort normal. He has no wheezes.  Diminished breath sounds in the lower lung fields, but there is no rhonchi appreciated  Abdominal: There is no tenderness.  Neurological:  Sleepy but easily arousable  Skin: Skin is warm.  Nursing note and vitals reviewed.    ED Treatments / Results  Labs (all labs ordered are listed, but only abnormal results are displayed) Labs Reviewed  CBC WITH DIFFERENTIAL/PLATELET - Abnormal;  Notable for the following components:      Result Value   WBC 24.4 (*)    RBC 3.70 (*)    Hemoglobin 11.5 (*)    HCT 35.1 (*)    Neutro Abs 21.9 (*)    Monocytes Absolute 1.2 (*)    All other components within normal limits  BASIC METABOLIC PANEL - Abnormal; Notable for the following components:   Glucose, Bld 148 (*)    BUN 22 (*)    Calcium 8.5 (*)    GFR calc non Af Amer 57 (*)    All other components within normal limits  CULTURE, BLOOD (ROUTINE X 2)  CULTURE, BLOOD (ROUTINE X 2)  URINE CULTURE  URINALYSIS, ROUTINE W REFLEX MICROSCOPIC  INFLUENZA PANEL BY PCR (TYPE A & B)  I-STAT CG4 LACTIC ACID, ED    EKG  EKG Interpretation None       Radiology Dg Chest Port 1 View  Result Date: 02/24/2017 CLINICAL DATA:  Cough.  Altered mental status. EXAM: PORTABLE CHEST 1 VIEW COMPARISON:  Chest x-rays dated 02/16/2017, 02/15/2017 and 01/26/2017 FINDINGS: Heart size and mediastinal contours are stable. Atherosclerotic changes again noted at the aortic arch. Patchy small ill-defined opacities are again seen bilaterally, mid and lower lung zones, similar to the most recent chest x-rays, new compared to the older chest x-ray of 01/26/2017. No pleural effusion or pneumothorax seen. Osseous structures about the chest are unremarkable. IMPRESSION: 1. Persistent patchy small ill-defined opacities within both lungs, mid and lower lung zones, similar to the most recent chest x-rays, new compared to an older chest x-ray of 01/26/2017. As suggested on previous exams, findings are suspicious for multifocal pneumonia. Neoplastic lesions are considered less likely, however, chest CT would be recommended for more definitive characterization if these continued to persist despite appropriate antibiotic therapy. 2. Aortic atherosclerosis. Electronically Signed   By: Bary RichardStan  Maynard M.D.   On: 02/24/2017 17:33    Procedures Procedures (including critical care time)  Medications Ordered in ED Medications    lactated ringers bolus 1,000 mL (not administered)  ceFEPIme (MAXIPIME) 2 g in sodium chloride 0.9 % 100 mL IVPB (not administered)  vancomycin (VANCOCIN) IVPB 1000 mg/200 mL premix (not administered)     Initial Impression / Assessment and Plan / ED Course  I have reviewed the triage vital signs and the  nursing notes.  Pertinent labs & imaging results that were available during my care of the patient were reviewed by me and considered in my medical decision making (see chart for details).  Clinical Course as of Feb 25 1755  Wynelle Link Feb 24, 2017  1755 Acute change in white count, CBC today shows white count 24.4 up from 9 yesterday. We will start patient on vancomycin and cefepime. WBC: (!) 24.4 [AN]  1755 Difficult to interpret the x-rays given the recent admission for multifocal pneumonia. The opacity seen on the x-ray could be residual from the infection.  However, patient also might have resistant bacteria and might need further antibiotics added to his regimen. At this time we will admit the patient. DG Chest Port 1 View [AN]    Clinical Course User Index [AN] Derwood Kaplan, MD    84 year old male with history of COPD, A. Fib and multiple admissions in the last 6 weeks comes in with chief complaint of altered mental status and cough.  I suspect that the altered mental status is more delirium.  At the time of my exam patient is oriented to self, location and is able to recognize his family members and able to follow simple commands.  We doubt that there is any acute intracranial process at this time.  Delirium could be due to underlying infection.  However patient was treated with full dose of IV antibiotics prior to being discharged yesterday, which makes infection lower in the differential diagnosis overall.  Patient does have poor aeration in the lower lung fields.  He also has chronic indwelling Foley catheter in place now.  CBC shows a white count of 24, which is markedly  elevated compared to yesterday when it was normal. We will start patient on Vanco and cefepime right now, with concerns for hospital-acquired infection.  Influenza screen also added.  Further discussion with patient's family about goals of care led to family notify me that now they are thinking about hospice care.  I consulted palliative medicine, they said that if patient gets admitted they will see the patient tomorrow.  Final Clinical Impressions(s) / ED Diagnoses   Final diagnoses:  Failure to thrive in adult  SIRS (systemic inflammatory response syndrome) [facility])    ED Discharge Orders    None       Derwood Kaplan, MD 02/24/17 1757

## 2017-02-24 NOTE — Progress Notes (Signed)
ANTICOAGULATION CONSULT NOTE - Initial Consult  Pharmacy Consult for warfarin Indication: atrial fibrillation  Allergies  Allergen Reactions  . Ativan [Lorazepam] Anaphylaxis  . Aspirin Other (See Comments)    CAUSES ULCERS  . Codeine Nausea And Vomiting  . Oxycodone Nausea And Vomiting    Patient Measurements: Height: 5\' 9"  (175.3 cm) Weight: 139 lb (63 kg) IBW/kg (Calculated) : 70.7 Heparin Dosing Weight: 63 kg  Vital Signs: Temp: 99.3 F (37.4 C) (02/17 1607) Temp Source: Rectal (02/17 1607) BP: 114/55 (02/17 1945) Pulse Rate: 76 (02/17 1945)  Labs: Recent Labs    02/22/17 0401 02/23/17 0455 02/24/17 1705  HGB  --   --  11.5*  HCT  --   --  35.1*  PLT  --   --  240  LABPROT 17.9* 18.8*  --   INR 1.49 1.59  --   CREATININE  --   --  1.15    Estimated Creatinine Clearance: 43.4 mL/min (by C-G formula based on SCr of 1.15 mg/dL).   Medical History: Past Medical History:  Diagnosis Date  . Back pain   . COPD (chronic obstructive pulmonary disease) (HCC)   . Depression   . Fatigue    a. in setting of Afib, improved following DCCV.  [name]. Hearing loss   . History of GI bleed 1976  . Hyperlipemia   . Hypertension   . Migraine   . Palpitations   . Persistent atrial fibrillation (HCC)    a. 02/2016 Echo: EF 66-65%, mild MR;  b. CHA2DS2VASc = 3-->coumadin;  c. 05/2016 TEE/DCCV: EF 45-50%, triv AI, mild to mod MR, no LA/LAA/RA/RAA thrombus, mod to sev TR-->successful DCCV.  [name]. Pneumonia   . Seizure (HCC)   . Syncope    a. related to seizure disorder    Medications:  Scheduled:  . [START ON 02/25/2017] bisoprolol  2.5 mg Oral BID  . [START ON 02/25/2017] ezetimibe-simvastatin  1 tablet Oral q1800  . lacosamide  200 mg Oral BID  . mirtazapine  15 mg Oral QHS  . [START ON 02/25/2017] pantoprazole  20 mg Oral Daily  . pregabalin  75 mg Oral BID  . [START ON 02/25/2017] tamsulosin  0.8 mg Oral QPC breakfast  . tiotropium  18 mcg Inhalation Daily     Assessment: 85 yom admitted with history of atrial fibrillation. On warfarin PTA - home regimen is 5 mg daily. Last dose was today prior to arriving at hospital.  INR yesterday was 1.59 - labs entered to be drawn tomorrow for INR. Hgb is 11.5, platelets are WNL. No signs/symptoms of bleeding.   Goal of Therapy:  Monitor platelets by anticoagulation protocol: Yes   Plan:  No additional warfarin doses tonight since already took dose for today Follow up on dosing pending INR tomorrow with morning labs  Girard CooterKimberly Perkins, PharmD Clinical Pharmacist  Pager: [phone] Clinical Phone for 02/24/2017 until 3:30pm: [phone] If after 3:30pm, please call main pharmacy at [phone] 02/24/2017,8:36 PM

## 2017-02-24 NOTE — H&P (Signed)
History and Physical    Zachary RavelSamuel D Peterson ZOX:[id number] DOB: Feb 08, 1931 DOA: 02/24/2017  PCP: Ignacia PalmaBeck, Mark C., MD   Patient coming from: Home  Chief Complaint: Respiratory problems, somnolent   HPI: Zachary RavelSamuel D Beem is a 84 y.o. male with medical history significant for COPD, paroxysmal atrial fibrillation, chronic systolic CHF, seizure disorder, and hypertension, now presenting to the emergency department at the urging of his home health nurse for evaluation of somnolence and rhonchorous breath sounds.  Patient was admitted to the hospital 1 month ago with aspiration pneumonia, discharged to SNF, developed worsening respiratory status, and was admitted to [facility] on 02/15/2017 with a multifocal pneumonia.  He was treated with vancomycin and Zosyn before being discharged yesterday with Augmentin and prednisone taper.  SNF had been recommended, but the patient and his family declined, and the patient went home.  He was seen by home health RN today, noted to be quite somnolent and with bilateral rhonchi on chest auscultation.  Was recommended that he go to the ED for further evaluation of this.  ED Course: Upon arrival to the ED, patient is found to be afebrile, saturating low 90s on his usual 2 L/min of supplemental oxygen, and vitals otherwise stable.  Chest x-ray features persistent patchy small ill-defined opacity bilaterally in the mid and lower lungs, similar to the most recent study and suspicious for multifocal pneumonia, but with underlying malignancy difficult to exclude.  Chemistry panel is unremarkable and CBC features a new leukocytosis to 24,400.  Lactic acid is reassuringly normal.  Blood cultures were collected, 1 L of lactated Ringer's given, and patient was treated with empiric vancomycin and cefepime.  At the request of patient's family, ED physician consulted with palliative care who agreed to see the patient in the hospital.  There was concern for worsening pneumonia based on the  new leukocytosis, but it is noted that he was started on prednisone yesterday at the outside hospital.  Hospitalists are asked to admit.  Patient will be observed on the medical-surgical unit for ongoing evaluation and management of failure to thrive and possible persistent/recurrent pneumonia and/or possible underlying malignancy.  Review of Systems:  All other systems reviewed and apart from HPI, are negative.  Past Medical History:  Diagnosis Date  . Back pain   . COPD (chronic obstructive pulmonary disease) (HCC)   . Depression   . Fatigue    a. in setting of Afib, improved following DCCV.  [name]. Hearing loss   . History of GI bleed 1976  . Hyperlipemia   . Hypertension   . Migraine   . Palpitations   . Persistent atrial fibrillation (HCC)    a. 02/2016 Echo: EF 66-65%, mild MR;  b. CHA2DS2VASc = 3-->coumadin;  c. 05/2016 TEE/DCCV: EF 45-50%, triv AI, mild to mod MR, no LA/LAA/RA/RAA thrombus, mod to sev TR-->successful DCCV.  [name]. Pneumonia   . Seizure (HCC)   . Syncope    a. related to seizure disorder    Past Surgical History:  Procedure Laterality Date  . APPENDECTOMY    . BACK SURGERY    . CARDIOVERSION N/A 06/06/2016   Procedure: CARDIOVERSION;  Surgeon: Yvonne KendallEnd, Christopher, MD;  Location: [facility] ORS;  Service: Cardiovascular;  Laterality: N/A;  . CATARACT EXTRACTION    . hemmoridectomy    . HERNIA REPAIR    . TEE WITHOUT CARDIOVERSION N/A 06/06/2016   Procedure: TRANSESOPHAGEAL ECHOCARDIOGRAM (TEE);  Surgeon: Yvonne KendallEnd, Christopher, MD;  Location: [facility] ORS;  Service: Cardiovascular;  Laterality: N/A;  reports that he has quit smoking. He has quit using smokeless tobacco. He reports that he does not drink alcohol or use drugs.  Allergies  Allergen Reactions  . Ativan [Lorazepam] Anaphylaxis  . Aspirin Other (See Comments)    CAUSES ULCERS  . Codeine Nausea And Vomiting  . Oxycodone Nausea And Vomiting    Family History  Problem Relation Age of Onset  . Heart disease Mother    . Hypertension Father      Prior to Admission medications   Medication Sig Start Date End Date Taking? Authorizing Provider  amoxicillin-clavulanate (AUGMENTIN) 875-125 MG tablet Take 1 tablet by mouth every 12 (twelve) hours. 02/21/17  Yes Sudini, Wardell Heath, MD  bisoprolol (ZEBETA) 5 MG tablet Take 0.5 tablets (2.5 mg total) by mouth 2 (two) times daily. 05/11/16  Yes End, Cristal Deer, MD  calcium carbonate (TUMS - DOSED IN MG ELEMENTAL CALCIUM) 500 MG chewable tablet Chew 1 tablet (200 mg of elemental calcium total) by mouth 3 (three) times daily with meals as needed for indigestion or heartburn. 02/24/16  Yes Gouru, Deanna Artis, MD  diazepam (VALIUM) 5 MG tablet Take 5 mg by mouth 2 (two) times daily.   Yes [provider]  ezetimibe-simvastatin (VYTORIN) 10-80 MG tablet Take 1 tablet by mouth daily.   Yes [provider]  fluticasone (FLONASE) 50 MCG/ACT nasal spray Place 2 sprays into both nostrils daily as needed for allergies.    Yes [provider]  hydroxypropyl methylcellulose / hypromellose (ISOPTO TEARS / GONIOVISC) 2.5 % ophthalmic solution Place 1 drop into both eyes 3 (three) times daily as needed for dry eyes.   Yes [provider]  ipratropium-albuterol (DUONEB) 0.5-2.5 (3) MG/3ML SOLN Take 3 mLs by nebulization every 6 (six) hours as needed. 02/21/17  Yes Sudini, Wardell Heath, MD  lacosamide (VIMPAT) 200 MG TABS tablet Take 1 tablet (200 mg total) by mouth 2 (two) times daily. 02/22/17  Yes Sudini, Wardell Heath, MD  mirtazapine (REMERON) 15 MG tablet Take 15 mg by mouth at bedtime.   Yes [provider]  ondansetron (ZOFRAN ODT) 8 MG disintegrating tablet Take 1 tablet (8 mg total) by mouth every 8 (eight) hours as needed for nausea or vomiting. 04/13/14  Yes Kirichenko, Tatyana, PA-C  pantoprazole (PROTONIX) 20 MG tablet Take 20 mg by mouth daily.   Yes [provider]  predniSONE (STERAPRED UNI-PAK 21 TAB) 10 MG (21) TBPK tablet 6 tabs day 1 and taper  1 tab a day - 6 days 02/22/17  Yes Sudini, Srikar, MD  pregabalin (LYRICA) 75 MG capsule Take 75 mg by mouth 2 (two) times daily.   Yes [provider]  tamsulosin (FLOMAX) 0.4 MG CAPS capsule Take 2 capsules (0.8 mg total) by mouth daily after breakfast. 02/10/17  Yes Penny Pia, MD  tiotropium (SPIRIVA) 18 MCG inhalation capsule Place 18 mcg into inhaler and inhale daily.   Yes [provider]  warfarin (COUMADIN) 2.5 MG tablet Take 5 mg by mouth daily.   Yes [provider]  warfarin (COUMADIN) 2.5 MG tablet TAKE 1 OR 2 TABLETS BY MOUTH EVERY DAY OR AS PHYSICIAN INSTRUCTED BY COUMADIN CLINIC Patient not taking: Reported on 02/24/2017 09/25/16   Yvonne Kendall, MD    Physical Exam: Vitals:   02/24/17 1900 02/24/17 1915 02/24/17 1930 02/24/17 1945  BP: 124/65 (!) 133/59 133/63 (!) 114/55  Pulse: 83 78 79 76  Resp: 18 15 17 16   Temp:      TempSrc:  SpO2: 96% 98% 98% 96%  Weight:      Height:          Constitutional: NAD, calm, cachectic Eyes: PERTLA, lids and conjunctivae normal ENMT: Mucous membranes are moist. Posterior pharynx clear of any exudate or lesions.   Neck: normal, supple, no masses, no thyromegaly Respiratory: Breath sounds diminished bilaterally with bibasilar rhonchi. No accessory muscle use.  Cardiovascular: S1 & S2 heard, regular rate and rhythm. No extremity edema. No significant JVD. Abdomen: No distension, no tenderness, no masses palpated. Bowel sounds normal.  Musculoskeletal: no clubbing / cyanosis. No joint deformity upper and lower extremities.    Skin: no significant rashes, lesions, ulcers. Warm, dry, well-perfused. Neurologic: No facial asymmetry. Sensation to light touch intact. Global weakness and lethargy.  Psychiatric: Somnolent, easily roused. Oriented x 3.     Labs on Admission: I have personally reviewed following labs and imaging studies  CBC: Recent Labs  Lab 02/18/17 0445 02/24/17 1705  WBC 3.9 24.4*    NEUTROABS  --  21.9*  HGB 9.1* 11.5*  HCT 27.0* 35.1*  MCV 93.5 94.9  PLT 330 240   Basic Metabolic Panel: Recent Labs  Lab 02/18/17 0445 02/21/17 1545 02/24/17 1705  NA 135 134* 136  K 4.2 4.3 4.4  CL 103 101 104  CO2 24 24 22   GLUCOSE 189* 112* 148*  BUN 15 17 22*  CREATININE 1.04 1.01 1.15  CALCIUM 7.6* 8.0* 8.5*  MG 1.7  --   --   PHOS 1.6* 3.3  --    GFR: Estimated Creatinine Clearance: 43.4 mL/min (by C-G formula based on SCr of 1.15 mg/dL). Liver Function Tests: No results for input(s): AST, ALT, ALKPHOS, BILITOT, PROT, ALBUMIN in the last 168 hours. No results for input(s): LIPASE, AMYLASE in the last 168 hours. No results for input(s): AMMONIA in the last 168 hours. Coagulation Profile: Recent Labs  Lab 02/19/17 0539 02/20/17 0530 02/21/17 0514 02/22/17 0401 02/23/17 0455  INR 1.15 1.28 1.30 1.49 1.59   Cardiac Enzymes: No results for input(s): CKTOTAL, CKMB, CKMBINDEX, TROPONINI in the last 168 hours. BNP (last 3 results) No results for input(s): PROBNP in the last 8760 hours. HbA1C: No results for input(s): HGBA1C in the last 72 hours. CBG: No results for input(s): GLUCAP in the last 168 hours. Lipid Profile: No results for input(s): CHOL, HDL, LDLCALC, TRIG, CHOLHDL, LDLDIRECT in the last 72 hours. Thyroid Function Tests: No results for input(s): TSH, T4TOTAL, FREET4, T3FREE, THYROIDAB in the last 72 hours. Anemia Panel: No results for input(s): VITAMINB12, FOLATE, FERRITIN, TIBC, IRON, RETICCTPCT in the last 72 hours. Urine analysis:    Component Value Date/Time   COLORURINE YELLOW (A) 02/15/2017 1138   APPEARANCEUR HAZY (A) 02/15/2017 1138   LABSPEC 1.018 02/15/2017 1138   PHURINE 5.0 02/15/2017 1138   GLUCOSEU NEGATIVE 02/15/2017 1138   HGBUR MODERATE (A) 02/15/2017 1138   BILIRUBINUR NEGATIVE 02/15/2017 1138   KETONESUR NEGATIVE 02/15/2017 1138   PROTEINUR 30 (A) 02/15/2017 1138   UROBILINOGEN 0.2 05/17/2009 1257   NITRITE NEGATIVE  02/15/2017 1138   LEUKOCYTESUR SMALL (A) 02/15/2017 1138   Sepsis Labs: @LABRCNTIP (procalcitonin:4,lacticidven:4) ) Recent Results (from the past 240 hour(s))  Culture, blood (routine x 2)     Status: None   Collection Time: 02/15/17 11:10 AM  Result Value Ref Range Status   Specimen Description BLOOD BLOOD LEFT HAND  Final   Special Requests   Final    BOTTLES DRAWN AEROBIC AND ANAEROBIC Blood Culture adequate volume  Culture   Final    NO GROWTH 5 DAYS Performed at [facility], [address] [place], [place] [address]    Report Status 02/20/2017 FINAL  Final  Culture, blood (routine x 2)     Status: None   Collection Time: 02/15/17 11:39 AM  Result Value Ref Range Status   Specimen Description BLOOD Blood Culture adequate volume  Final   Special Requests BLOOD LEFT FOREARM  Final   Culture   Final    NO GROWTH 5 DAYS Performed at [facility], [address] [place], [place] [address]    Report Status 02/20/2017 FINAL  Final  MRSA PCR Screening     Status: None   Collection Time: 02/15/17  4:34 PM  Result Value Ref Range Status   MRSA by PCR NEGATIVE NEGATIVE Final    Comment:        The GeneXpert MRSA Assay (FDA approved for NASAL specimens only), is one component of a comprehensive MRSA colonization surveillance program. It is not intended to diagnose MRSA infection nor to guide or monitor treatment for MRSA infections. Performed at [facility], [address], [place], [place] [address]      Radiological Exams on Admission: Dg Chest [facility] 1 View  Result Date: 02/24/2017 CLINICAL DATA:  Cough.  Altered mental status. EXAM: PORTABLE CHEST 1 VIEW COMPARISON:  Chest x-rays dated 02/16/2017, 02/15/2017 and 01/26/2017 FINDINGS: Heart size and mediastinal contours are stable. Atherosclerotic changes again noted at the aortic arch. Patchy small ill-defined opacities are again seen bilaterally, mid and lower lung zones, similar to the  most recent chest x-rays, new compared to the older chest x-ray of 01/26/2017. No pleural effusion or pneumothorax seen. Osseous structures about the chest are unremarkable. IMPRESSION: 1. Persistent patchy small ill-defined opacities within both lungs, mid and lower lung zones, similar to the most recent chest x-rays, new compared to an older chest x-ray of 01/26/2017. As suggested on previous exams, findings are suspicious for multifocal pneumonia. Neoplastic lesions are considered less likely, however, chest CT would be recommended for more definitive characterization if these continued to persist despite appropriate antibiotic therapy. 2. Aortic atherosclerosis. Electronically Signed   By: Bary Richard M.D.   On: 02/24/2017 17:33    EKG: Not performed.   Assessment/Plan  1. HCAP  - Presents at direction of [facility] RN for evaluation of rhonchi on exam and somnolence  - Had been admitted with aspiration PNA on 1/19, re-admitted to [facility] on 2/8 with acute multifocal PNA, treated with vancomycin and Zosyn, discharged with prednisone and Augmentin on 02/23/17  - CXR similar to prior with suspected multifocal PNA, difficult to exclude underlying malignancy  - WBC is newly elevated, but he was treated with steroids at [facility] yesterday  - No fever, normal lactate  - Check CT chest to further delineate if there is acute infection, resolving PNA, or underlying cancer  - Influenza PCR pending, check procalcitonin  - Blood cultures collected in ED and vancomycin and cefepime started, will continue   2. Failure to thrive  - Patient severely deconditioned/debilitated - Refused SNF upon discharge from [facility] on 02/23/17  - Family requests palliative consultation, want to take patient home  3. Chronic systolic CHF  - EF 45-50% on echo from May '18 with mod-severe TR - Appears hypovolemic on admission, treated with 1 L LR in ED and continued on gentle IVF hydration  - Follow daily wt and I/O, continue  beta-blocker as tolerated  4. COPD - No wheezing or distress on admission  - Continue Spiriva, prn nebs, supplemental O2   5. Paroxysmal atrial fibrillation  - In a regular rhythm on admission  - CHADS-VASc at least 3 (age x2, HTN)  - Continue warfarin and beta-blocker   6. Seizure disorder  - Continue Vimpat   7. Urinary retention  - Flomax increased two weeks ago, discharged on 2/16 from [facility] with Foley catheter d/t retention  - Catheter care     DVT prophylaxis: warfarin Code Status: DNR Family Communication: Wife and grandchildren updated at bedside Disposition Plan: Observe on med-surg Consults called: None Admission status: Observation    Briscoe Deutscher, MD [facility] Pager [phone]  If 7PM-7AM, please contact night-coverage [email or web address] Password [email or web address]  02/24/2017, 8:30 PM

## 2017-02-24 NOTE — Progress Notes (Addendum)
Pharmacy Antibiotic Note  Zachary RavelSamuel D Peterson is a 84 y.o. male admitted on 02/24/2017 with pneumonia.  Pharmacy has been consulted for vancomycin and cefepime dosing. Noted patient was recently discharged from [facility] yesterday morning. Discharged with chronic foley, home O2, and to be followed by palliative. Patient was on vancomycin for a few days at [facility], but no level was obtained so cannot assess the regimen he was previously on.  WBC elevated, SCr 1.1, afebrile. Cefepime 2g and vancomycin 1g already ordered to be given in the ED.  Plan: Vancomycin 1g IV x1, then 500mg  IV q12h Cefepime 1g IV q12h Follow clinical progression, c/s, renal function, level PRN   Height: 5\' 9"  (175.3 cm) Weight: 139 lb (63 kg) IBW/kg (Calculated) : 70.7  Temp (24hrs), Avg:99.3 F (37.4 C), Min:99.3 F (37.4 C), Max:99.3 F (37.4 C)  Recent Labs  Lab 02/18/17 0445 02/21/17 1545 02/24/17 1705  WBC 3.9  --  24.4*  CREATININE 1.04 1.01  --     Estimated Creatinine Clearance: 49.5 mL/min (by C-G formula based on SCr of 1.01 mg/dL).    Allergies  Allergen Reactions  . Ativan [Lorazepam] Anaphylaxis  . Aspirin Other (See Comments)    CAUSES ULCERS  . Codeine Nausea And Vomiting  . Oxycodone Nausea And Vomiting    Antimicrobials this admission: Vancomycin 2/17 >>  Cefepime 2/17 >>   Dose adjustments this admission: n/a  Microbiology results: 2/17 BCx:  2/17 UCx:    Thank you for allowing pharmacy to be a part of this patient's care.  Emony Dormer D. Maja Mccaffery, PharmD, BCPS Clinical Pharmacist 02/24/2017 5:58 PM

## 2017-02-24 NOTE — ED Triage Notes (Signed)
Pt to ED via [facility] with reports of pt being slow to respond at home.  On arrival Pt will respond to verbal stimuli.  Pt alert and oriented per norm.  Pt denies any pain or discomfort.  Pt was recently seen at [facility] yesterday

## 2017-02-25 DIAGNOSIS — B965 Pseudomonas (aeruginosa) (mallei) (pseudomallei) as the cause of diseases classified elsewhere: Secondary | ICD-10-CM | POA: Diagnosis present

## 2017-02-25 DIAGNOSIS — R569 Unspecified convulsions: Secondary | ICD-10-CM | POA: Diagnosis not present

## 2017-02-25 DIAGNOSIS — R627 Adult failure to thrive: Secondary | ICD-10-CM | POA: Diagnosis not present

## 2017-02-25 DIAGNOSIS — R0902 Hypoxemia: Secondary | ICD-10-CM | POA: Diagnosis present

## 2017-02-25 DIAGNOSIS — J101 Influenza due to other identified influenza virus with other respiratory manifestations: Secondary | ICD-10-CM

## 2017-02-25 DIAGNOSIS — J449 Chronic obstructive pulmonary disease, unspecified: Secondary | ICD-10-CM | POA: Diagnosis not present

## 2017-02-25 DIAGNOSIS — R339 Retention of urine, unspecified: Secondary | ICD-10-CM | POA: Diagnosis present

## 2017-02-25 DIAGNOSIS — E785 Hyperlipidemia, unspecified: Secondary | ICD-10-CM | POA: Diagnosis present

## 2017-02-25 DIAGNOSIS — Y846 Urinary catheterization as the cause of abnormal reaction of the patient, or of later complication, without mention of misadventure at the time of the procedure: Secondary | ICD-10-CM | POA: Diagnosis present

## 2017-02-25 DIAGNOSIS — E43 Unspecified severe protein-calorie malnutrition: Secondary | ICD-10-CM | POA: Diagnosis present

## 2017-02-25 DIAGNOSIS — J440 Chronic obstructive pulmonary disease with acute lower respiratory infection: Secondary | ICD-10-CM | POA: Diagnosis present

## 2017-02-25 DIAGNOSIS — J441 Chronic obstructive pulmonary disease with (acute) exacerbation: Secondary | ICD-10-CM | POA: Diagnosis present

## 2017-02-25 DIAGNOSIS — I5022 Chronic systolic (congestive) heart failure: Secondary | ICD-10-CM | POA: Diagnosis present

## 2017-02-25 DIAGNOSIS — J84116 Cryptogenic organizing pneumonia: Secondary | ICD-10-CM | POA: Diagnosis present

## 2017-02-25 DIAGNOSIS — R1310 Dysphagia, unspecified: Secondary | ICD-10-CM | POA: Diagnosis not present

## 2017-02-25 DIAGNOSIS — I110 Hypertensive heart disease with heart failure: Secondary | ICD-10-CM | POA: Diagnosis present

## 2017-02-25 DIAGNOSIS — R7881 Bacteremia: Secondary | ICD-10-CM | POA: Diagnosis not present

## 2017-02-25 DIAGNOSIS — I10 Essential (primary) hypertension: Secondary | ICD-10-CM | POA: Diagnosis not present

## 2017-02-25 DIAGNOSIS — G40909 Epilepsy, unspecified, not intractable, without status epilepticus: Secondary | ICD-10-CM | POA: Diagnosis present

## 2017-02-25 DIAGNOSIS — I481 Persistent atrial fibrillation: Secondary | ICD-10-CM | POA: Diagnosis present

## 2017-02-25 DIAGNOSIS — R9389 Abnormal findings on diagnostic imaging of other specified body structures: Secondary | ICD-10-CM | POA: Diagnosis not present

## 2017-02-25 DIAGNOSIS — E861 Hypovolemia: Secondary | ICD-10-CM | POA: Diagnosis present

## 2017-02-25 DIAGNOSIS — Z9981 Dependence on supplemental oxygen: Secondary | ICD-10-CM | POA: Diagnosis not present

## 2017-02-25 DIAGNOSIS — R531 Weakness: Secondary | ICD-10-CM | POA: Diagnosis not present

## 2017-02-25 DIAGNOSIS — T83511A Infection and inflammatory reaction due to indwelling urethral catheter, initial encounter: Secondary | ICD-10-CM | POA: Diagnosis present

## 2017-02-25 DIAGNOSIS — B9689 Other specified bacterial agents as the cause of diseases classified elsewhere: Secondary | ICD-10-CM | POA: Diagnosis present

## 2017-02-25 DIAGNOSIS — R64 Cachexia: Secondary | ICD-10-CM | POA: Diagnosis present

## 2017-02-25 DIAGNOSIS — Z515 Encounter for palliative care: Secondary | ICD-10-CM | POA: Diagnosis not present

## 2017-02-25 DIAGNOSIS — N390 Urinary tract infection, site not specified: Secondary | ICD-10-CM | POA: Diagnosis present

## 2017-02-25 DIAGNOSIS — Z66 Do not resuscitate: Secondary | ICD-10-CM | POA: Diagnosis not present

## 2017-02-25 DIAGNOSIS — I071 Rheumatic tricuspid insufficiency: Secondary | ICD-10-CM | POA: Diagnosis present

## 2017-02-25 DIAGNOSIS — D638 Anemia in other chronic diseases classified elsewhere: Secondary | ICD-10-CM | POA: Diagnosis present

## 2017-02-25 DIAGNOSIS — Y95 Nosocomial condition: Secondary | ICD-10-CM | POA: Diagnosis present

## 2017-02-25 DIAGNOSIS — I480 Paroxysmal atrial fibrillation: Secondary | ICD-10-CM | POA: Diagnosis present

## 2017-02-25 DIAGNOSIS — J189 Pneumonia, unspecified organism: Secondary | ICD-10-CM | POA: Diagnosis not present

## 2017-02-25 DIAGNOSIS — A4152 Sepsis due to Pseudomonas: Secondary | ICD-10-CM | POA: Diagnosis not present

## 2017-02-25 LAB — CBC WITH DIFFERENTIAL/PLATELET
BASOS ABS: 0 10*3/uL (ref 0.0–0.1)
Basophils Relative: 0 %
EOS PCT: 0 %
Eosinophils Absolute: 0 10*3/uL (ref 0.0–0.7)
HCT: 33.5 % — ABNORMAL LOW (ref 39.0–52.0)
Hemoglobin: 10.9 g/dL — ABNORMAL LOW (ref 13.0–17.0)
LYMPHS ABS: 2.2 10*3/uL (ref 0.7–4.0)
LYMPHS PCT: 9 %
MCH: 31 pg (ref 26.0–34.0)
MCHC: 32.5 g/dL (ref 30.0–36.0)
MCV: 95.2 fL (ref 78.0–100.0)
Monocytes Absolute: 1.2 10*3/uL — ABNORMAL HIGH (ref 0.1–1.0)
Monocytes Relative: 5 %
Neutro Abs: 20.1 10*3/uL — ABNORMAL HIGH (ref 1.7–7.7)
Neutrophils Relative %: 86 %
PLATELETS: 231 10*3/uL (ref 150–400)
RBC: 3.52 MIL/uL — ABNORMAL LOW (ref 4.22–5.81)
RDW: 15.6 % — ABNORMAL HIGH (ref 11.5–15.5)
WBC: 23.6 10*3/uL — ABNORMAL HIGH (ref 4.0–10.5)

## 2017-02-25 LAB — BASIC METABOLIC PANEL
Anion gap: 10 (ref 5–15)
BUN: 20 mg/dL (ref 6–20)
CO2: 22 mmol/L (ref 22–32)
Calcium: 8.2 mg/dL — ABNORMAL LOW (ref 8.9–10.3)
Chloride: 107 mmol/L (ref 101–111)
Creatinine, Ser: 0.93 mg/dL (ref 0.61–1.24)
GFR calc Af Amer: >60 mL/min (ref >60)
GLUCOSE: 92 mg/dL (ref 65–99)
POTASSIUM: 4.2 mmol/L (ref 3.5–5.1)
Sodium: 139 mmol/L (ref 135–145)

## 2017-02-25 LAB — PROCALCITONIN: Procalcitonin: 0.83 ng/mL

## 2017-02-25 LAB — PROTIME-INR
INR: 2.34
Prothrombin Time: 25.5 seconds — ABNORMAL HIGH (ref 11.4–15.2)

## 2017-02-25 MED ORDER — PREDNISONE 50 MG PO TABS
50.0000 mg | ORAL_TABLET | Freq: Every day | ORAL | Status: DC
  Administered 2017-02-25 – 2017-02-28 (×4): 50 mg via ORAL
  Filled 2017-02-25: qty 1
  Filled 2017-02-25: qty 3
  Filled 2017-02-25 (×2): qty 1

## 2017-02-25 MED ORDER — WARFARIN SODIUM 3 MG PO TABS
3.0000 mg | ORAL_TABLET | Freq: Once | ORAL | Status: AC
  Administered 2017-02-25: 3 mg via ORAL
  Filled 2017-02-25 (×2): qty 1

## 2017-02-25 MED ORDER — OSELTAMIVIR PHOSPHATE 30 MG PO CAPS
30.0000 mg | ORAL_CAPSULE | Freq: Two times a day (BID) | ORAL | Status: DC
  Administered 2017-02-25 – 2017-02-28 (×6): 30 mg via ORAL
  Filled 2017-02-25 (×6): qty 1

## 2017-02-25 MED ORDER — PREDNISONE 20 MG PO TABS: 20.0000 mg | ORAL_TABLET | Freq: Every day | ORAL | Status: DC

## 2017-02-25 MED ORDER — SODIUM CHLORIDE 0.9 % IV SOLN
INTRAVENOUS | Status: AC
  Administered 2017-02-25: 1000 mL via INTRAVENOUS

## 2017-02-25 MED ORDER — OSELTAMIVIR PHOSPHATE 75 MG PO CAPS
75.0000 mg | ORAL_CAPSULE | Freq: Every day | ORAL | Status: DC
  Administered 2017-02-25: 75 mg via ORAL
  Filled 2017-02-25: qty 1

## 2017-02-25 NOTE — Progress Notes (Signed)
Progress Note    Zachary Peterson  ZOX:[id number] DOB: [birth year]  DOA: 02/24/2017 PCP: Ignacia Palma., MD    Brief Narrative:   Chief complaint: F/U FTT, somnolence.  Medical records reviewed and are as summarized below:  Zachary Peterson is an 84 y.o. male with a PMH of COPD, PAF, chronic systolic CHF, seizure disorder and hypertension who was admitted 02/24/17 for evaluation of somnolence in the setting of discharge from [facility] on 02/24/16 where he was treated for multifocal pneumonia. At the time of his discharge, family declined SNF placement. In the ED, he was placed on empiric vancomycin and cefepime for concerns of worsening pneumonia. Family is interested in palliative care at this point, and a consult has been requested.  Assessment/Plan:   Principal Problem:   COP vs HCAP (healthcare-associated pneumonia)/aspiration pneumonia, recurrent/Influenza A Has had recurrent problems with aspiration pneumonia with most recent admission 02/15/17-02/23/17. It appears he completed a course of vancomycin/Zosyn and was discharged on prednisone and Augmentin. Repeat chest x-ray personally reviewed and is similar to prior with multifocal pneumonia. No fever or elevation of lactate. CT personally reviewed and confirms multifocal pneumonia with possible underlying cryptogenic organizing pneumonia. Influenza A +. Will place on prednisone 50 mg daily and continue Tamiflu. Treatment of COP typically involves systemic steroids for 4-8 weeks followed by a slow taper. No evidence of underlying malignancy. Follow-up blood cultures. Discontinue vancomycin, and can likely discontinue cefepime soon given recent completion of appropriate antibiotics for healthcare acquired pneumonia. MRSA screen negative.  Active Problems:   Seizure (HCC) Continue Vimpat.    Chronic obstructive pulmonary disease (HCC) Continue Spiriva, bronchodilators and supplemental oxygen.    PAF (paroxysmal atrial fibrillation)  (HCC) CHADS-VASc at least 3 (age x2, HTN). Continue warfarin and beta blocker.  Heart rate controlled.    Benign essential HTN Systolic blood pressure 150s-160s. Continue beta blocker.    Failure to thrive in adult Palliative care consultation pending.    Chronic systolic CHF (congestive heart failure) (HCC) EF 45-50 percent on Echo 05/2016. Continue beta blocker.    Urinary retention Continue Foley catheter for drainage.    Normocytic anemia Likely anemia of chronic disease.    Severe protein calorie malnutrition/underweight Body mass index is 20.53 kg/m.   Family Communication/Anticipated D/C date and plan/Code Status   DVT prophylaxis: Coumadin ordered. Code Status: DO NOT RESUSCITATE.  Family Communication: Wife and daughter at bedside. Disposition Plan: Unclear. Home with hospice likely when medically stable.   Medical Consultants:    Palliative Care   Anti-Infectives:    Cefepime 02/24/17--->  Vancomycin 02/24/17--->02/25/17  Tamiflu 02/24/17--->02/28/17  Subjective:   Continues to feel weak. Appetite fair. No shortness of breath, but does have a congested cough.   Objective:    Vitals:   02/25/17 0600 02/25/17 0615 02/25/17 0630 02/25/17 0645  BP: (!) 161/69 (!) 157/68 (!) 162/68 (!) 152/63  Pulse: 90 90 94 89  Resp: 17 (!) 21 19 (!) 21  Temp:      TempSrc:      SpO2: 94% 93% 92% 92%  Weight:      Height:        Intake/Output Summary (Last 24 hours) at 02/25/2017 0833 Last data filed at 02/25/2017 8119 Gross per 24 hour  Intake 1300 ml  Output -  Net 1300 ml   Filed Weights   02/24/17 1609  Weight: 63 kg (139 lb)    Exam: General: Elderly male with malaise. Cardiovascular: Heart sounds show a  regular rate, and rhythm. No gallops or rubs. No murmurs. No JVD. Lungs: Diminished with bibasilar crackles. Abdomen: Soft, nontender, nondistended with normal active bowel sounds. No masses. No hepatosplenomegaly. Neurological: Lethargic, oriented  2. Moves all extremities 4 with equal strength. Cranial nerves II through XII grossly intact. Skin: Warm and dry. No rashes or lesions. Extremities: No clubbing or cyanosis. No edema. Pedal pulses 2+. Psychiatric: Mood and affect are flat. Insight and judgment are fair.   Data Reviewed:   I have personally reviewed following labs and imaging studies:  Labs: Labs show the following:   Basic Metabolic Panel: Recent Labs  Lab 02/21/17 1545 02/24/17 1705 02/25/17 0308  NA 134* 136 139  K 4.3 4.4 4.2  CL 101 104 107  CO2 24 22 22   GLUCOSE 112* 148* 92  BUN 17 22* 20  CREATININE 1.01 1.15 0.93  CALCIUM 8.0* 8.5* 8.2*  PHOS 3.3  --   --    GFR Estimated Creatinine Clearance: 53.7 mL/min (by C-G formula based on SCr of 0.93 mg/dL).  Coagulation profile Recent Labs  Lab 02/20/17 0530 02/21/17 0514 02/22/17 0401 02/23/17 0455 02/25/17 0308  INR 1.28 1.30 1.49 1.59 2.34    CBC: Recent Labs  Lab 02/24/17 1705 02/25/17 0308  WBC 24.4* 23.6*  NEUTROABS 21.9* 20.1*  HGB 11.5* 10.9*  HCT 35.1* 33.5*  MCV 94.9 95.2  PLT 240 231   Sepsis Labs: Recent Labs  Lab 02/24/17 1705 02/24/17 1826 02/25/17 0308  PROCALCITON 0.88  --  0.83  WBC 24.4*  --  23.6*  LATICACIDVEN  --  1.17  --     Microbiology Recent Results (from the past 240 hour(s))  Culture, blood (routine x 2)     Status: None   Collection Time: 02/15/17 11:10 AM  Result Value Ref Range Status   Specimen Description BLOOD BLOOD LEFT HAND  Final   Special Requests   Final    BOTTLES DRAWN AEROBIC AND ANAEROBIC Blood Culture adequate volume   Culture   Final    NO GROWTH 5 DAYS Performed at [facility] [facility], [address] [place], [place] [address]    Report Status 02/20/2017 FINAL  Final  Culture, blood (routine x 2)     Status: None   Collection Time: 02/15/17 11:39 AM  Result Value Ref Range Status   Specimen Description BLOOD Blood Culture adequate volume  Final   Special Requests  BLOOD LEFT FOREARM  Final   Culture   Final    NO GROWTH 5 DAYS Performed at [facility], [address] [place], [place] [address]    Report Status 02/20/2017 FINAL  Final  MRSA PCR Screening     Status: None   Collection Time: 02/15/17  4:34 PM  Result Value Ref Range Status   MRSA by PCR NEGATIVE NEGATIVE Final    Comment:        The GeneXpert MRSA Assay (FDA approved for NASAL specimens only), is one component of a comprehensive MRSA colonization surveillance program. It is not intended to diagnose MRSA infection nor to guide or monitor treatment for MRSA infections. Performed at [facility], [address], [place], [place] [address]     Procedures and diagnostic studies:  Ct Chest W Contrast  Result Date: 02/24/2017 CLINICAL DATA:  Acute respiratory illness.  History of pneumonia. EXAM: CT CHEST WITH CONTRAST TECHNIQUE: Multidetector CT imaging of the chest was performed during intravenous contrast administration. CONTRAST:  75mL ISOVUE-300 IOPAMIDOL (ISOVUE-300) INJECTION  61% COMPARISON:  Chest CT April 13, 2014 and chest x-ray February 24, 2017 FINDINGS: Cardiovascular: Atherosclerotic changes are seen in the thoracic aorta without dissection or aneurysm. The heart is normal. Coronary artery calcifications are seen most prominent in the left coronary artery. The central pulmonary arteries are normal. Mediastinum/Nodes: No adenopathy. There is a small hiatal hernia. The esophagus is otherwise normal. The thyroid is unremarkable. No effusions. Lungs/Pleura: Central airways are normal. No pneumothorax. Patchy opacities are identified most prominent the bases, left greater than right. There is also mild involvement of the right upper lobe on image 61. No masses. Upper Abdomen: No acute abnormality. Musculoskeletal: No chest wall abnormality. No acute or significant osseous findings. IMPRESSION: 1. Patchy bilateral pulmonary infiltrates most prominent in the bases  but also involving the right upper lobe most likely represent an infectious process/multifocal pneumonia. Cryptogenic organizing pneumonia could have a similar appearance. Recommend follow-up to resolution. 2. Atherosclerotic changes in the thoracic aorta. 3. Coronary artery calcifications. 4. No other acute abnormalities. Aortic Atherosclerosis (ICD10-I70.0). Electronically Signed   By: Gerome Samavid  Williams III M.D   On: 02/24/2017 21:56   Dg Chest Port 1 View  Result Date: 02/24/2017 CLINICAL DATA:  Cough.  Altered mental status. EXAM: PORTABLE CHEST 1 VIEW COMPARISON:  Chest x-rays dated 02/16/2017, 02/15/2017 and 01/26/2017 FINDINGS: Heart size and mediastinal contours are stable. Atherosclerotic changes again noted at the aortic arch. Patchy small ill-defined opacities are again seen bilaterally, mid and lower lung zones, similar to the most recent chest x-rays, new compared to the older chest x-ray of 01/26/2017. No pleural effusion or pneumothorax seen. Osseous structures about the chest are unremarkable. IMPRESSION: 1. Persistent patchy small ill-defined opacities within both lungs, mid and lower lung zones, similar to the most recent chest x-rays, new compared to an older chest x-ray of 01/26/2017. As suggested on previous exams, findings are suspicious for multifocal pneumonia. Neoplastic lesions are considered less likely, however, chest CT would be recommended for more definitive characterization if these continued to persist despite appropriate antibiotic therapy. 2. Aortic atherosclerosis. Electronically Signed   By: Bary RichardStan  Maynard M.D.   On: 02/24/2017 17:33    Medications:   . bisoprolol  2.5 mg Oral BID  . ezetimibe-simvastatin  1 tablet Oral q1800  . lacosamide  200 mg Oral BID  . mirtazapine  15 mg Oral QHS  . pantoprazole  20 mg Oral Daily  . pregabalin  75 mg Oral BID  . tamsulosin  0.8 mg Oral QPC breakfast  . tiotropium  18 mcg Inhalation Daily  . Warfarin - Pharmacist Dosing  Inpatient   Does not apply q1800   Continuous Infusions: . ceFEPime (MAXIPIME) IV Stopped (02/25/17 0641)  . vancomycin Stopped (02/25/17 0742)     LOS: 0 days   Hillery Aldohristina Rama  [facility] Pager [phone]. If unable to reach me by pager, please call my cell phone at [phone].  *Please refer to [email or web address], password [email or web address] to get updated schedule on who will round on this patient, as hospitalists switch teams weekly. If 7PM-7AM, please contact night-coverage at [email or web address], password [email or web address] for any overnight needs.  02/25/2017, 8:33 AM

## 2017-02-25 NOTE — Progress Notes (Signed)
PHARMACY - PHYSICIAN COMMUNICATION CRITICAL VALUE ALERT - BLOOD CULTURE IDENTIFICATION (BCID)  Zachary RavelSamuel D Peterson is an 84 y.o. male who [facility] with a chief complaint of somnolence.  Assessment:  Has had recurrent issues with aspiration pneumonia (recently treated with vancomycin/zosyn) w/ repeat CXR showing multifocal pneumonia. Was on vancomycin and cefepime; however, vancomycin has been discontinued. WBC is 24.4>23.6. PCT 0.83. Found to be influenza - on Tamiflu.   BCx showing GNR in pediatric bottle- BCID ran twice showing invalid.   Name of physician (or Provider) Contacted: Bodenheimer  Current antibiotics: Cefepime  Changes to prescribed antibiotics recommended:  Response not received from provider;  current antibiotics are likely to cover the isolated organism.  Consider de-escalation soon.  Results for orders placed or performed during the hospital encounter of 01/26/17  Blood Culture ID Panel (Reflexed) (Collected: 01/28/2017  7:35 AM)  Result Value Ref Range   Enterococcus species NOT DETECTED NOT DETECTED   Listeria monocytogenes NOT DETECTED NOT DETECTED   Staphylococcus species DETECTED (A) NOT DETECTED   Staphylococcus aureus NOT DETECTED NOT DETECTED   Methicillin resistance NOT DETECTED NOT DETECTED   Streptococcus species NOT DETECTED NOT DETECTED   Streptococcus agalactiae NOT DETECTED NOT DETECTED   Streptococcus pneumoniae NOT DETECTED NOT DETECTED   Streptococcus pyogenes NOT DETECTED NOT DETECTED   Acinetobacter baumannii NOT DETECTED NOT DETECTED   Enterobacteriaceae species NOT DETECTED NOT DETECTED   Enterobacter cloacae complex NOT DETECTED NOT DETECTED   Escherichia coli NOT DETECTED NOT DETECTED   Klebsiella oxytoca NOT DETECTED NOT DETECTED   Klebsiella pneumoniae NOT DETECTED NOT DETECTED   Proteus species NOT DETECTED NOT DETECTED   Serratia marcescens NOT DETECTED NOT DETECTED   Carbapenem resistance NOT DETECTED NOT  DETECTED   Haemophilus influenzae NOT DETECTED NOT DETECTED   Neisseria meningitidis NOT DETECTED NOT DETECTED   Pseudomonas aeruginosa NOT DETECTED NOT DETECTED   Candida albicans NOT DETECTED NOT DETECTED   Candida glabrata NOT DETECTED NOT DETECTED   Candida krusei NOT DETECTED NOT DETECTED   Candida parapsilosis NOT DETECTED NOT DETECTED   Candida tropicalis NOT DETECTED NOT DETECTED    Clarnce FlockKimberly A Perkins 02/25/2017  9:29 PM

## 2017-02-25 NOTE — Progress Notes (Signed)
Pt arrived to unit, transported by staff in bed. AOx2. Spouse notified. VSS.

## 2017-02-25 NOTE — Progress Notes (Signed)
ANTICOAGULATION CONSULT NOTE - Follow Up Consult  Pharmacy Consult for warfarin Indication: atrial fibrillation  Allergies  Allergen Reactions  . Ativan [Lorazepam] Anaphylaxis  . Aspirin Other (See Comments)    CAUSES ULCERS  . Codeine Nausea And Vomiting  . Oxycodone Nausea And Vomiting    Patient Measurements: Height: 5\' 9"  (175.3 cm) Weight: 139 lb (63 kg) IBW/kg (Calculated) : 70.7 Heparin Dosing Weight: 63 kg  Vital Signs: BP: 136/87 (02/18 0930) Pulse Rate: 97 (02/18 0930)  Labs: Recent Labs    02/23/17 0455 02/24/17 1705 02/25/17 0308  HGB  --  11.5* 10.9*  HCT  --  35.1* 33.5*  PLT  --  240 231  LABPROT 18.8*  --  25.5*  INR 1.59  --  2.34  CREATININE  --  1.15 0.93    Estimated Creatinine Clearance: 53.7 mL/min (by C-G formula based on SCr of 0.93 mg/dL).   Medical History: Past Medical History:  Diagnosis Date  . Back pain   . COPD (chronic obstructive pulmonary disease) (HCC)   . Depression   . Fatigue    a. in setting of Afib, improved following DCCV.  [name]. Hearing loss   . History of GI bleed 1976  . Hyperlipemia   . Hypertension   . Migraine   . Palpitations   . Persistent atrial fibrillation (HCC)    a. 02/2016 Echo: EF 66-65%, mild MR;  b. CHA2DS2VASc = 3-->coumadin;  c. 05/2016 TEE/DCCV: EF 45-50%, triv AI, mild to mod MR, no LA/LAA/RA/RAA thrombus, mod to sev TR-->successful DCCV.  [name]. Pneumonia   . Seizure (HCC)   . Syncope    a. related to seizure disorder    Medications:  Scheduled:  . bisoprolol  2.5 mg Oral BID  . ezetimibe-simvastatin  1 tablet Oral q1800  . lacosamide  200 mg Oral BID  . mirtazapine  15 mg Oral QHS  . oseltamivir  75 mg Oral Daily  . pantoprazole  20 mg Oral Daily  . predniSONE  50 mg Oral Q breakfast  . pregabalin  75 mg Oral BID  . tamsulosin  0.8 mg Oral QPC breakfast  . tiotropium  18 mcg Inhalation Daily  . Warfarin - Pharmacist Dosing Inpatient   Does not apply q1800    Assessment: [age] yom admitted  with history of atrial fibrillation. On warfarin PTA - home regimen is 5 mg daily.   INR 1.59>2.34 over 2 days. H/H 10.9/33.5, Plt wnl   Goal of Therapy:  Monitor platelets by anticoagulation protocol: Yes   Plan:  Warfarin 3 mg x 1 dose given INR rise over last 2 days  Daily PT/INR   Vinnie LevelBenjamin Akira Adelsberger, PharmD., BCPS Clinical Pharmacist Pager [phone]

## 2017-02-26 DIAGNOSIS — Z515 Encounter for palliative care: Secondary | ICD-10-CM

## 2017-02-26 DIAGNOSIS — R627 Adult failure to thrive: Secondary | ICD-10-CM

## 2017-02-26 DIAGNOSIS — Z66 Do not resuscitate: Secondary | ICD-10-CM

## 2017-02-26 DIAGNOSIS — R531 Weakness: Secondary | ICD-10-CM

## 2017-02-26 DIAGNOSIS — R7881 Bacteremia: Secondary | ICD-10-CM

## 2017-02-26 DIAGNOSIS — R1310 Dysphagia, unspecified: Secondary | ICD-10-CM

## 2017-02-26 LAB — BASIC METABOLIC PANEL
ANION GAP: 9 (ref 5–15)
BUN: 21 mg/dL — AB (ref 6–20)
CHLORIDE: 106 mmol/L (ref 101–111)
CO2: 22 mmol/L (ref 22–32)
Calcium: 8.1 mg/dL — ABNORMAL LOW (ref 8.9–10.3)
Creatinine, Ser: 0.96 mg/dL (ref 0.61–1.24)
GFR calc Af Amer: >60 mL/min (ref >60)
Glucose, Bld: 125 mg/dL — ABNORMAL HIGH (ref 65–99)
POTASSIUM: 4.1 mmol/L (ref 3.5–5.1)
SODIUM: 137 mmol/L (ref 135–145)

## 2017-02-26 LAB — CBC
HCT: 31.6 % — ABNORMAL LOW (ref 39.0–52.0)
HEMOGLOBIN: 10.1 g/dL — AB (ref 13.0–17.0)
MCH: 30.8 pg (ref 26.0–34.0)
MCHC: 32 g/dL (ref 30.0–36.0)
MCV: 96.3 fL (ref 78.0–100.0)
PLATELETS: 251 10*3/uL (ref 150–400)
RBC: 3.28 MIL/uL — AB (ref 4.22–5.81)
RDW: 15.7 % — ABNORMAL HIGH (ref 11.5–15.5)
WBC: 17.7 10*3/uL — AB (ref 4.0–10.5)

## 2017-02-26 LAB — LEGIONELLA PNEUMOPHILA SEROGP 1 UR AG: L. pneumophila Serogp 1 Ur Ag: NEGATIVE

## 2017-02-26 LAB — GLUCOSE, CAPILLARY: GLUCOSE-CAPILLARY: 105 mg/dL — AB (ref 65–99)

## 2017-02-26 LAB — PROTIME-INR
INR: 1.8
PROTHROMBIN TIME: 20.7 s — AB (ref 11.4–15.2)

## 2017-02-26 MED ORDER — CEFEPIME HCL 2 G IJ SOLR
2.0000 g | Freq: Two times a day (BID) | INTRAMUSCULAR | Status: DC
  Administered 2017-02-26 – 2017-02-28 (×4): 2 g via INTRAVENOUS
  Filled 2017-02-26 (×5): qty 2

## 2017-02-26 MED ORDER — WARFARIN SODIUM 5 MG PO TABS
5.0000 mg | ORAL_TABLET | Freq: Once | ORAL | Status: AC
Start: 2017-02-26 — End: 2017-02-26
  Administered 2017-02-26: 5 mg via ORAL
  Filled 2017-02-26: qty 1

## 2017-02-26 NOTE — Progress Notes (Signed)
ANTICOAGULATION CONSULT NOTE - Follow Up Consult  Pharmacy Consult for warfarin Indication: atrial fibrillation  Allergies  Allergen Reactions  . Ativan [Lorazepam] Anaphylaxis  . Aspirin Other (See Comments)    CAUSES ULCERS  . Codeine Nausea And Vomiting  . Oxycodone Nausea And Vomiting    Patient Measurements: Height: 5\' 9"  (175.3 cm) Weight: 143 lb 4.8 oz (65 kg) IBW/kg (Calculated) : 70.7 Heparin Dosing Weight: 63 kg  Vital Signs: Temp: 98.1 F (36.7 C) (02/19 0440) Temp Source: Oral (02/19 0440) BP: 135/62 (02/19 0440) Pulse Rate: 71 (02/19 0440)  Labs: Recent Labs    02/24/17 1705 02/25/17 0308 02/26/17 0417  HGB 11.5* 10.9* 10.1*  HCT 35.1* 33.5* 31.6*  PLT 240 231 251  LABPROT  --  25.5* 20.7*  INR  --  2.34 1.80  CREATININE 1.15 0.93 0.96    Estimated Creatinine Clearance: 53.6 mL/min (by C-G formula based on SCr of 0.96 mg/dL).   Medical History: Past Medical History:  Diagnosis Date  . Back pain   . COPD (chronic obstructive pulmonary disease) (HCC)   . Depression   . Fatigue    a. in setting of Afib, improved following DCCV.  [name]. Hearing loss   . History of GI bleed 1976  . Hyperlipemia   . Hypertension   . Migraine   . Palpitations   . Persistent atrial fibrillation (HCC)    a. 02/2016 Echo: EF 66-65%, mild MR;  b. CHA2DS2VASc = 3-->coumadin;  c. 05/2016 TEE/DCCV: EF 45-50%, triv AI, mild to mod MR, no LA/LAA/RA/RAA thrombus, mod to sev TR-->successful DCCV.  [name]. Pneumonia   . Seizure (HCC)   . Syncope    a. related to seizure disorder    Medications:  Scheduled:  . bisoprolol  2.5 mg Oral BID  . ezetimibe-simvastatin  1 tablet Oral q1800  . lacosamide  200 mg Oral BID  . mirtazapine  15 mg Oral QHS  . oseltamivir  30 mg Oral BID  . pantoprazole  20 mg Oral Daily  . predniSONE  50 mg Oral Q breakfast  . pregabalin  75 mg Oral BID  . tamsulosin  0.8 mg Oral QPC breakfast  . tiotropium  18 mcg Inhalation Daily  . Warfarin -  Pharmacist Dosing Inpatient   Does not apply q1800    Assessment: [age] yom admitted with history of atrial fibrillation. On warfarin PTA - home regimen is 5 mg daily.   INR 1.59>2.34 over 2 days after dose was reduced on 2/18. INR now down to 1.8. CBC stable. No bleed documented.  Goal of Therapy:  INR 2-3 Monitor platelets by anticoagulation protocol: Yes   Plan:  Warfarin 5 mg x 1 dose tonight Daily INR Monitor CBC, s/sx bleeding  Babs BertinHaley Archit Leger, PharmD, BCPS Clinical Pharmacist Clinical phone for 02/26/2017 until 3:30pm: [phone] If after 3:30pm, please call main pharmacy at: [phone] 02/26/2017 9:54 AM

## 2017-02-26 NOTE — Progress Notes (Signed)
Progress Note    Zachary Peterson  ZOX:[id number] DOB: [birth year]  DOA: 02/24/2017 PCP: Ignacia Palma., MD    Brief Narrative:   Chief complaint: F/U FTT, somnolence.  Medical records reviewed and are as summarized below:  Zachary Peterson is an 84 y.o. male with a PMH of COPD, PAF, chronic systolic CHF, seizure disorder and hypertension who was admitted 02/24/17 for evaluation of somnolence in the setting of discharge from [facility] on 02/24/16 where he was treated for multifocal pneumonia. At the time of his discharge, family declined SNF placement. In the ED, he was placed on empiric vancomycin and cefepime for concerns of worsening pneumonia. Family is interested in palliative care at this point, and a consult has been requested.  Assessment/Plan:   Principal Problem:   COP vs HCAP (healthcare-associated pneumonia)/aspiration pneumonia, recurrent/Influenza A/gram-negative rod bacteremia Has had recurrent problems with aspiration pneumonia with most recent admission 02/15/17-02/23/17. It appears he completed a course of vancomycin/Zosyn and was discharged on prednisone and Augmentin. Repeat chest x-ray  similar to prior with multifocal pneumonia. No fever or elevation of lactate. CT showed multifocal pneumonia with possible underlying cryptogenic organizing pneumonia. Influenza A +. Will place on prednisone 50 mg daily and continue Tamiflu. Treatment of COP typically involves systemic steroids for 4-8 weeks followed by a slow taper. No evidence of underlying malignancy. One of 2 blood cultures growing gram-negative rods. Remains on cefepime, vancomycin discontinued 02/25/17. MRSA screen negative. WBC improving.  Active Problems:   Seizure (HCC) Continue Vimpat. No seizures.    Chronic obstructive pulmonary disease (HCC) Continue Spiriva, bronchodilators and supplemental oxygen. Stable.    PAF (paroxysmal atrial fibrillation) (HCC) CHADS-VASc at least 3 (age x2, HTN). Continue warfarin and beta  blocker.  Heart rate 70s-80s.    Benign essential HTN Blood pressure controlled on beta blocker.    Failure to thrive in adult Palliative care consultation pending. Mobilize. PT evaluation.    Chronic systolic CHF (congestive heart failure) (HCC) EF 45-50 percent on Echo 05/2016. Continue beta blocker. No evidence of decompensation.    Urinary retention Continue Foley catheter for drainage.    Normocytic anemia Likely anemia of chronic disease.    Severe protein calorie malnutrition/underweight Body mass index is 21.16 kg/m.   Family Communication/Anticipated D/C date and plan/Code Status   DVT prophylaxis: Coumadin ordered. Code Status: DO NOT RESUSCITATE.  Family Communication: Wife and daughter at bedside 02/25/17. Disposition Plan: Unclear. Home with hospice likely when medically stable.   Medical Consultants:    Palliative Care   Anti-Infectives:    Cefepime 02/24/17--->  Vancomycin 02/24/17--->02/25/17  Tamiflu 02/24/17--->02/28/17  Subjective:   Mr. Portell says he feels a bit better today. Continues to cough. Denies shortness of breath, chest pain, nausea.  Appetite good.  Objective:    Vitals:   02/25/17 1830 02/25/17 2034 02/26/17 0025 02/26/17 0440  BP: (!) 105/45 (!) 106/45 (!) 105/46 135/62  Pulse: 78 85 71 71  Resp: 18 18 18 18   Temp: 98 F (36.7 C) 98.1 F (36.7 C) 98.1 F (36.7 C) 98.1 F (36.7 C)  TempSrc: Oral Oral Oral Oral  SpO2: 99% 96% 97% 99%  Weight:    65 kg (143 lb 4.8 oz)  Height:        Intake/Output Summary (Last 24 hours) at 02/26/2017 0744 Last data filed at 02/26/2017 0441 Gross per 24 hour  Intake 1000 ml  Output 1650 ml  Net -650 ml   Filed Weights   02/24/17  1609 02/26/17 0440  Weight: 63 kg (139 lb) 65 kg (143 lb 4.8 oz)    Exam: General: No acute distress. Cardiovascular: Heart sounds show a regular rate, and rhythm. No gallops or rubs. No murmurs. No JVD. Lungs: Diminished. No rales, rhonchi or  wheezes. Abdomen: Soft, nontender, nondistended with normal active bowel sounds. No masses. No hepatosplenomegaly. Skin: Warm and dry. No rashes or lesions. Extremities: No clubbing or cyanosis. No edema. Pedal pulses 2+.  Data Reviewed:   I have personally reviewed following labs and imaging studies:  Labs: Labs show the following:   Basic Metabolic Panel: Recent Labs  Lab 02/21/17 1545 02/24/17 1705 02/25/17 0308 02/26/17 0417  NA 134* 136 139 137  K 4.3 4.4 4.2 4.1  CL 101 104 107 106  CO2 24 22 22 22   GLUCOSE 112* 148* 92 125*  BUN 17 22* 20 21*  CREATININE 1.01 1.15 0.93 0.96  CALCIUM 8.0* 8.5* 8.2* 8.1*  PHOS 3.3  --   --   --    GFR Estimated Creatinine Clearance: 53.6 mL/min (by C-G formula based on SCr of 0.96 mg/dL).  Coagulation profile Recent Labs  Lab 02/21/17 0514 02/22/17 0401 02/23/17 0455 02/25/17 0308 02/26/17 0417  INR 1.30 1.49 1.59 2.34 1.80    CBC: Recent Labs  Lab 02/24/17 1705 02/25/17 0308 02/26/17 0417  WBC 24.4* 23.6* 17.7*  NEUTROABS 21.9* 20.1*  --   HGB 11.5* 10.9* 10.1*  HCT 35.1* 33.5* 31.6*  MCV 94.9 95.2 96.3  PLT 240 231 251   Sepsis Labs: Recent Labs  Lab 02/24/17 1705 02/24/17 1826 02/25/17 0308 02/26/17 0417  PROCALCITON 0.88  --  0.83  --   WBC 24.4*  --  23.6* 17.7*  LATICACIDVEN  --  1.17  --   --     Microbiology Recent Results (from the past 240 hour(s))  Blood Culture (routine x 2)     Status: None (Preliminary result)   Collection Time: 02/24/17  6:10 PM  Result Value Ref Range Status   Specimen Description BLOOD LEFT HAND  Final   Special Requests IN PEDIATRIC BOTTLE Blood Culture adequate volume  Final   Culture   Final    NO GROWTH < 24 HOURS Performed at [facility], [address] [place], [place] [address]    Report Status PENDING  Incomplete  Blood Culture (routine x 2)     Status: None (Preliminary result)   Collection Time: 02/24/17  6:16 PM  Result Value Ref Range Status    Specimen Description BLOOD RIGHT ANTECUBITAL  Final   Special Requests IN PEDIATRIC BOTTLE Blood Culture adequate volume  Final   Culture  Setup Time   Final    GRAM NEGATIVE RODS IN PEDIATRIC BOTTLE CRITICAL RESULT CALLED TO, READ BACK BY AND VERIFIED WITH: Virgel GessK PERKINS [facility] 2118 02/25/17 A BROWNING Performed at [facility], [address] [place], [place] [address]    Culture PENDING  Incomplete   Report Status PENDING  Incomplete    Procedures and diagnostic studies:  Ct Chest W Contrast  Result Date: 02/24/2017 CLINICAL DATA:  Acute respiratory illness.  History of pneumonia. EXAM: CT CHEST WITH CONTRAST TECHNIQUE: Multidetector CT imaging of the chest was performed during intravenous contrast administration. CONTRAST:  75mL ISOVUE-300 IOPAMIDOL (ISOVUE-300) INJECTION 61% COMPARISON:  Chest CT April 13, 2014 and chest x-ray February 24, 2017 FINDINGS: Cardiovascular: Atherosclerotic changes are seen in the thoracic aorta without dissection or aneurysm. The heart is  normal. Coronary artery calcifications are seen most prominent in the left coronary artery. The central pulmonary arteries are normal. Mediastinum/Nodes: No adenopathy. There is a small hiatal hernia. The esophagus is otherwise normal. The thyroid is unremarkable. No effusions. Lungs/Pleura: Central airways are normal. No pneumothorax. Patchy opacities are identified most prominent the bases, left greater than right. There is also mild involvement of the right upper lobe on image 61. No masses. Upper Abdomen: No acute abnormality. Musculoskeletal: No chest wall abnormality. No acute or significant osseous findings. IMPRESSION: 1. Patchy bilateral pulmonary infiltrates most prominent in the bases but also involving the right upper lobe most likely represent an infectious process/multifocal pneumonia. Cryptogenic organizing pneumonia could have a similar appearance. Recommend follow-up to resolution. 2. Atherosclerotic changes in  the thoracic aorta. 3. Coronary artery calcifications. 4. No other acute abnormalities. Aortic Atherosclerosis (ICD10-I70.0). Electronically Signed   By: Gerome Sam III M.D   On: 02/24/2017 21:56   Dg Chest Port 1 View  Result Date: 02/24/2017 CLINICAL DATA:  Cough.  Altered mental status. EXAM: PORTABLE CHEST 1 VIEW COMPARISON:  Chest x-rays dated 02/16/2017, 02/15/2017 and 01/26/2017 FINDINGS: Heart size and mediastinal contours are stable. Atherosclerotic changes again noted at the aortic arch. Patchy small ill-defined opacities are again seen bilaterally, mid and lower lung zones, similar to the most recent chest x-rays, new compared to the older chest x-ray of 01/26/2017. No pleural effusion or pneumothorax seen. Osseous structures about the chest are unremarkable. IMPRESSION: 1. Persistent patchy small ill-defined opacities within both lungs, mid and lower lung zones, similar to the most recent chest x-rays, new compared to an older chest x-ray of 01/26/2017. As suggested on previous exams, findings are suspicious for multifocal pneumonia. Neoplastic lesions are considered less likely, however, chest CT would be recommended for more definitive characterization if these continued to persist despite appropriate antibiotic therapy. 2. Aortic atherosclerosis. Electronically Signed   By: Bary Richard M.D.   On: 02/24/2017 17:33    Medications:   . bisoprolol  2.5 mg Oral BID  . ezetimibe-simvastatin  1 tablet Oral q1800  . lacosamide  200 mg Oral BID  . mirtazapine  15 mg Oral QHS  . oseltamivir  30 mg Oral BID  . pantoprazole  20 mg Oral Daily  . predniSONE  50 mg Oral Q breakfast  . pregabalin  75 mg Oral BID  . tamsulosin  0.8 mg Oral QPC breakfast  . tiotropium  18 mcg Inhalation Daily  . Warfarin - Pharmacist Dosing Inpatient   Does not apply q1800   Continuous Infusions: . ceFEPime (MAXIPIME) IV 1 g (02/26/17 0537)     LOS: 1 day   Hillery Aldo  [facility] Pager  [phone]. If unable to reach me by pager, please call my cell phone at [phone].  *Please refer to [email or web address], password [email or web address] to get updated schedule on who will round on this patient, as hospitalists switch teams weekly. If 7PM-7AM, please contact night-coverage at [email or web address], password [email or web address] for any overnight needs.  02/26/2017, 7:44 AM

## 2017-02-26 NOTE — Progress Notes (Signed)
Patient's daughter Zachary Peterson approached this RN upset that patient has not been getting his valium 2 times a day.  She states that the patient takes 5mg  of valium 2 times daily scheduled not as needed.  Daughter states that the patient will become confused and agitated if he continues to not get the valium 2 times a day.  RN advised daughter that patient could get the valium now and that RN would pass off in report to the nurse tomorrow to have patient given valium at 10 am with morning meds.  She was in agreement and appeared to be less upset and more understanding.

## 2017-02-26 NOTE — Evaluation (Signed)
Physical Therapy Evaluation Patient Details Name: Zachary Peterson MRN: [id number] DOB: 1931-08-19 Today's Date: 02/26/2017   History of Present Illness  84 y.o. male admitted on 2/8 with HCAP and Afib. After admission to the floor the pt presented with hypotension and was transferred to the ICU.  Following improvement a new PT order was received.  Pt with recent admission at [facility] with acute respiratory distress s/p seizure, intubated 01/26/17-01/28/17. Pt with significant PMH of dysphasia, syncope, seizure, a-fib s/p cardioversion, migraine, HTN, GIB, HOH, COPD, back pain s/p surgery, fatigue and depression.    Clinical Impression  Pt admitted with above. Pt with generalized deconditioning and decreased activity tolerance from prolonged hospital course over the last month. Pt was able to stand pvt to chair this date. Pt strongly desires to return home however pt requires maxA at this time for all mobility. Recommend SNF upon d/c to achieve safe supervision level of function prior to returning home with wife.    Follow Up Recommendations SNF    Equipment Recommendations  (TBD at next venue)    Recommendations for Other Services       Precautions / Restrictions Precautions Precautions: Fall Restrictions Weight Bearing Restrictions: No      Mobility  Bed Mobility Overal bed mobility: Needs Assistance Bed Mobility: Supine to Sit;Sit to Supine     Supine to sit: Min assist;HOB elevated     General bed mobility comments: increased time, max directional v/c's  Transfers Overall transfer level: Needs assistance Equipment used: 1 person hand held assist(with gait belt from the front, L knee blocked) Transfers: Sit to/from [employer] Sit to Stand: Max assist Stand pivot transfers: Max assist       General transfer comment: bed elevated, maximal sequencing directions to step to chair, knee, hip/trunk flexion, + SOB, on 2 LO2 via [facility]  Ambulation/Gait              General Gait Details: unable to amb this date  Stairs            Wheelchair Mobility    Modified Rankin (Stroke Patients Only)       Balance Overall balance assessment: Needs assistance Sitting-balance support: Feet supported;Bilateral upper extremity supported Sitting balance-Leahy Scale: Poor Sitting balance - Comments: pt with L lateral lean, progressively worsening as time increased   Standing balance support: Bilateral upper extremity supported;During functional activity Standing balance-Leahy Scale: Poor Standing balance comment: dependent on external assist                             Pertinent Vitals/Pain Pain Assessment: No/denies pain    Home Living Family/patient expects to be discharged to:: Skilled nursing facility Living Arrangements: Spouse/significant other Available Help at Discharge: Family;Available 24 hours/day Type of Home: House Home Access: Stairs to enter Entrance Stairs-Rails: Right;Left;Can reach both Entrance Stairs-Number of Steps: 4 Home Layout: One level Home Equipment: Cane - single point;Shower seat Additional Comments: pt was amb with cane and RW about a month ago but has been in/out of hospital/SNF for the last month. pt was indep with ADLs as well up untl a month away    Prior Function Level of [place]: Needs assistance   Gait / Transfers Assistance Needed: hasn't been walking in the last month but was using cane and RW  ADL's / Homemaking Assistance Needed: was able to complete indep until 1 month ago        Hand Dominance  Dominant Hand: Right    Extremity/Trunk Assessment   Upper Extremity Assessment Upper Extremity Assessment: Generalized weakness    Lower Extremity Assessment Lower Extremity Assessment: Generalized weakness    Cervical / Trunk Assessment Cervical / Trunk Assessment: Kyphotic(forward head)  Communication   Communication: HOH  Cognition Arousal/Alertness:  Awake/alert Behavior During Therapy: WFL for tasks assessed/performed Overall Cognitive Status: Impaired/Different from baseline Area of Impairment: Orientation;Attention;Memory;Following commands;Safety/judgement;Awareness;Problem solving                 Orientation Level: Disoriented to;Time Current Attention Level: Selective Memory: Decreased short-term memory Following Commands: Follows one step commands consistently Safety/Judgement: Decreased awareness of safety Awareness: Intellectual Problem Solving: Slow processing;Difficulty sequencing;Requires verbal cues;Requires tactile cues        General Comments      Exercises General Exercises - Lower Extremity Quad Sets: AROM;Both;15 reps;Supine Long Arc Quad: AROM;Both;15 reps;Seated Hip Flexion/Marching: AROM;Both;15 reps;Seated   Assessment/Plan    PT Assessment Patient needs continued PT services  PT Problem List Decreased strength;Decreased activity tolerance;Decreased balance;Decreased knowledge of use of DME;Cardiopulmonary status limiting activity;Decreased safety awareness       PT Treatment Interventions DME instruction;Gait training;Stair training;Functional mobility training;Therapeutic activities;Therapeutic exercise;Balance training;Neuromuscular re-education;Patient/family education    PT Goals (Current goals can be found in the Care Plan section)  Acute Rehab PT Goals Patient Stated Goal: to regain independence PT Goal Formulation: With patient Time For Goal Achievement: 03/12/17 Potential to Achieve Goals: Fair    Frequency Min 3X/week   Barriers to discharge        Co-evaluation               AM-PAC PT "6 Clicks" Daily Activity  Outcome Measure Difficulty turning over in bed (including adjusting bedclothes, sheets and blankets)?: Unable Difficulty moving from lying on back to sitting on the side of the bed? : Unable Difficulty sitting down on and standing up from a chair with arms  (e.g., wheelchair, bedside commode, etc,.)?: Unable Help needed moving to and from a bed to chair (including a wheelchair)?: Total Help needed walking in hospital room?: Total Help needed climbing 3-5 steps with a railing? : Total 6 Click Score: 6    End of Session Equipment Utilized During Treatment: Gait belt;Oxygen Activity Tolerance: Patient tolerated treatment well Patient left: in chair;with call bell/phone within reach;with family/visitor present Nurse Communication: Mobility status;Other (comment) PT Visit Diagnosis: Muscle weakness (generalized) (M62.81);Unsteadiness on feet (R26.81);Other abnormalities of gait and mobility (R26.89);Difficulty in walking, not elsewhere classified (R26.2)    Time: [id number] PT Time Calculation (min) (ACUTE ONLY): 25 min   Charges:   PT Evaluation $PT Eval Moderate Complexity: 1 Mod PT Treatments $Therapeutic Activity: 8-22 mins   PT G Codes:        Lewis ShockAshly Markisha Meding, PT, DPT Pager #: [phone] Office #: [phone]   Wei Poplaski M Derisha Funderburke 02/26/2017, 2:57 PM

## 2017-02-26 NOTE — Progress Notes (Signed)
PHARMACY NOTE:  ANTIMICROBIAL RENAL DOSAGE ADJUSTMENT  Current antimicrobial regimen includes a mismatch between antimicrobial dosage and estimated renal function.  As per policy approved by the Pharmacy & Therapeutics and Medical Executive Committees, the antimicrobial dosage will be adjusted accordingly.  Current antimicrobial dosage:  Cefepime 1g IV q12h  Indication: PNA  Renal Function:  Estimated Creatinine Clearance: 53.6 mL/min (by C-G formula based on SCr of 0.96 mg/dL).    Antimicrobial dosage has been changed to:  Cefepime 2g IV q12h  Zachary BertinHaley Aylinn Peterson, PharmD, BCPS Clinical Pharmacist Clinical phone for 02/26/2017 until 3:30pm: [phone] If after 3:30pm, please call main pharmacy at: [phone] 02/26/2017 9:56 AM

## 2017-02-27 DIAGNOSIS — Z515 Encounter for palliative care: Secondary | ICD-10-CM

## 2017-02-27 DIAGNOSIS — J84116 Cryptogenic organizing pneumonia: Principal | ICD-10-CM

## 2017-02-27 LAB — CULTURE, BLOOD (ROUTINE X 2): Special Requests: ADEQUATE

## 2017-02-27 LAB — GLUCOSE, CAPILLARY: GLUCOSE-CAPILLARY: 85 mg/dL (ref 65–99)

## 2017-02-27 LAB — PROTIME-INR
INR: 2.07
Prothrombin Time: 23.1 seconds — ABNORMAL HIGH (ref 11.4–15.2)

## 2017-02-27 MED ORDER — GUAIFENESIN ER 600 MG PO TB12
600.0000 mg | ORAL_TABLET | Freq: Two times a day (BID) | ORAL | Status: DC
  Administered 2017-02-27 – 2017-02-28 (×3): 600 mg via ORAL
  Filled 2017-02-27 (×3): qty 1

## 2017-02-27 MED ORDER — DIAZEPAM 5 MG PO TABS: 5.0000 mg | ORAL_TABLET | Freq: Two times a day (BID) | ORAL | Status: DC | PRN

## 2017-02-27 MED ORDER — DIAZEPAM 5 MG PO TABS: 5.0000 mg | ORAL_TABLET | Freq: Two times a day (BID) | ORAL | Status: DC

## 2017-02-27 MED ORDER — WARFARIN SODIUM 5 MG PO TABS
5.0000 mg | ORAL_TABLET | Freq: Once | ORAL | Status: AC
  Administered 2017-02-27: 5 mg via ORAL
  Filled 2017-02-27: qty 1

## 2017-02-27 MED ORDER — DIAZEPAM 5 MG PO TABS
5.0000 mg | ORAL_TABLET | Freq: Two times a day (BID) | ORAL | Status: DC
  Administered 2017-02-27 – 2017-02-28 (×2): 5 mg via ORAL
  Filled 2017-02-27 (×2): qty 1

## 2017-02-27 NOTE — Progress Notes (Signed)
ANTICOAGULATION CONSULT NOTE - Follow Up Consult  Pharmacy Consult for warfarin Indication: atrial fibrillation  Allergies  Allergen Reactions  . Ativan [Lorazepam] Anaphylaxis  . Aspirin Other (See Comments)    CAUSES ULCERS  . Codeine Nausea And Vomiting  . Oxycodone Nausea And Vomiting    Patient Measurements: Height: 5\' 9"  (175.3 cm) Weight: 148 lb 2.4 oz (67.2 kg) IBW/kg (Calculated) : 70.7 Heparin Dosing Weight: 63 kg  Vital Signs: Temp: 98.9 F (37.2 C) (02/20 1046) Temp Source: Oral (02/20 1046) BP: 134/55 (02/20 1046) Pulse Rate: 77 (02/20 1046)  Labs: Recent Labs    02/24/17 1705 02/25/17 0308 02/26/17 0417 02/27/17 0442  HGB 11.5* 10.9* 10.1*  --   HCT 35.1* 33.5* 31.6*  --   PLT 240 231 251  --   LABPROT  --  25.5* 20.7* 23.1*  INR  --  2.34 1.80 2.07  CREATININE 1.15 0.93 0.96  --     Estimated Creatinine Clearance: 55.4 mL/min (by C-G formula based on SCr of 0.96 mg/dL).   Medical History: Past Medical History:  Diagnosis Date  . Back pain   . COPD (chronic obstructive pulmonary disease) (HCC)   . Depression   . Fatigue    a. in setting of Afib, improved following DCCV.  [name]. Hearing loss   . History of GI bleed 1976  . Hyperlipemia   . Hypertension   . Migraine   . Palpitations   . Persistent atrial fibrillation (HCC)    a. 02/2016 Echo: EF 66-65%, mild MR;  b. CHA2DS2VASc = 3-->coumadin;  c. 05/2016 TEE/DCCV: EF 45-50%, triv AI, mild to mod MR, no LA/LAA/RA/RAA thrombus, mod to sev TR-->successful DCCV.  [name]. Pneumonia   . Seizure (HCC)   . Syncope    a. related to seizure disorder    Medications:  Scheduled:  . bisoprolol  2.5 mg Oral BID  . diazepam  5 mg Oral Q12H  . ezetimibe-simvastatin  1 tablet Oral q1800  . guaiFENesin  600 mg Oral BID  . lacosamide  200 mg Oral BID  . mirtazapine  15 mg Oral QHS  . oseltamivir  30 mg Oral BID  . pantoprazole  20 mg Oral Daily  . predniSONE  50 mg Oral Q breakfast  . pregabalin  75 mg Oral  BID  . tamsulosin  0.8 mg Oral QPC breakfast  . tiotropium  18 mcg Inhalation Daily  . Warfarin - Pharmacist Dosing Inpatient   Does not apply q1800    Assessment: [age] yom admitted with history of atrial fibrillation. On warfarin PTA - home regimen is 5 mg daily.   INR 1.59>2.34 over 2 days after dose was reduced on 2/18. INR 1.8>2 restart home warfarin 5mg  daily and watch.   CBC stable. No bleed documented.  Goal of Therapy:  INR 2-3 Monitor platelets by anticoagulation protocol: Yes   Plan:  Warfarin 5 mg x 1 dose tonight Daily INR Monitor CBC, s/sx bleeding  Leota SauersLisa Jerritt Cardoza Pharm.D. CPP, BCPS Clinical Pharmacist [phone] 02/27/2017 12:38 PM

## 2017-02-27 NOTE — Consult Note (Addendum)
Consultation Note Date: 02/27/2017   Patient Name: Zachary Peterson  DOB: 1931-09-24  MRN: [id number]  Age / Sex: 84 y.o., male  PCP: Ignacia Palma., MD Referring Physician: Calvert Cantor, MD  Reason for Consultation:  GOCs and emotional support  HPI/Patient Profile: 84 y.o. male   admitted on 02/24/2017 with a past  medical history significant for COPD, paroxysmal atrial fibrillation, chronic systolic CHF, seizure disorder, and hypertension, now presenting to the emergency department at the urging of his home health nurse for evaluation of somnolence and rhonchorous breath sounds.    Patient was admitted to the hospital 1 month ago with aspiration pneumonia, discharged to SNF, developed worsening respiratory status, and was admitted to [facility] hospital on 02/15/2017 with a multifocal pneumonia.    SNF had been recommended, but the patient and his family declined, and the patient went home.    Admitted through the ER for treatment and stabilization. Upon arrival to the ED, patient is found to be afebrile, saturating low 90s on his usual 2 L/min of supplemental oxygen, and vitals otherwise stable.  Chest x-ray features persistent patchy small ill-defined opacity bilaterally in the mid and lower lungs, similar to the most recent study and suspicious for multifocal pneumonia, but with underlying malignancy difficult to exclude.     Patient has had continued slow physical, functional and cognitive decline over the past year but dramatic decline since December 2018.  Multiple hospitalizations, continued failure to thrive, poor po intake, decreased mobility, increased care needs.   Family continue to face advanced directive decisions, and anticipatory care needs.  Clinical Assessment and Goals of Care:  This NP Lorinda Creed reviewed medical records, received report from team, assessed the patient and then meet at the  patient's bedside along with his daughter Agustin Cree  to discuss diagnosis, prognosis, GOC, EOL wishes disposition and options.  A detailed discussion was had today regarding advanced directives.  Concepts specific to code status, artifical feeding and hydration, continued IV antibiotics and rehospitalization was had.  The difference between a aggressive medical intervention path  and a palliative comfort care path for this patient at this time was had.  Values and goals of care important to patient and family were attempted to be elicited.  I meet this patient and family less than a week ago at [facility].  The same detailed conversation occurred at that time.  Questions and concerns addressed.   Family encouraged to call with questions or concerns.        SUMMARY OF RECOMMENDATIONS    Code Status/Advance Care Planning:  DNR   Symptoms  Dysphagia- documented on multiple admission, high risk for aspirataion   Palliative Prophylaxis:   Aspiration, Bowel Regimen, Delirium Protocol, Frequent Pain Assessment and Oral Care  Additional Recommendations (Limitations, Scope, Preferences):  Full scope treatment  Psycho-social/Spiritual:   Desire for further Chaplaincy support:no  Additional Recommendations: Education on Hospice  Prognosis:   Unable to determine  Discharge Planning:    Family is frustrated with the realistic care options available  to them. They are requesting a referral to [facility].  Family states that if they can not get into [facility] they will take the patient home again.  Family verbalize that care needs are difficult to meet for this patient at home.   Wife is debilitated herself and family members work.  Patient remains high risk to decompensate     Primary Diagnoses: Present on Admission: . Benign essential HTN . Chronic obstructive pulmonary disease (HCC) . HCAP (healthcare-associated pneumonia) . PAF (paroxysmal atrial fibrillation) (HCC) . Failure to  thrive in adult . Chronic systolic CHF (congestive heart failure) (HCC) . Cryptogenic organizing pneumonia (HCC) . Influenza A . Bacteremia due to Gram-negative bacteria   I have reviewed the medical record, interviewed the patient and family, and examined the patient. The following aspects are pertinent.  Past Medical History:  Diagnosis Date  . Back pain   . COPD (chronic obstructive pulmonary disease) (HCC)   . Depression   . Fatigue    a. in setting of Afib, improved following DCCV.  [name] Hearing loss   . History of GI bleed 1976  . Hyperlipemia   . Hypertension   . Migraine   . Palpitations   . Persistent atrial fibrillation (HCC)    a. 02/2016 Echo: EF 66-65%, mild MR;  b. CHA2DS2VASc = 3-->coumadin;  c. 05/2016 TEE/DCCV: EF 45-50%, triv AI, mild to mod MR, no LA/LAA/RA/RAA thrombus, mod to sev TR-->successful DCCV.  [name] Pneumonia   . Seizure (HCC)   . Syncope    a. related to seizure disorder   Social History   Socioeconomic History  . Marital status: Married    Spouse name: None  . Number of children: None  . Years of education: None  . Highest education level: None  Social Needs  . Financial resource strain: None  . Food insecurity - worry: None  . Food insecurity - inability: None  . Transportation needs - medical: None  . Transportation needs - non-medical: None  Occupational History  . None  Tobacco Use  . Smoking status: Former Games developer  . Smokeless tobacco: Former Engineer, water and Sexual Activity  . Alcohol use: No  . Drug use: No  . Sexual activity: No  Other Topics Concern  . None  Social History Narrative  . None   Family History  Problem Relation Age of Onset  . Heart disease Mother   . Hypertension Father    Scheduled Meds: . bisoprolol  2.5 mg Oral BID  . diazepam  5 mg Oral Q12H  . ezetimibe-simvastatin  1 tablet Oral q1800  . guaiFENesin  600 mg Oral BID  . lacosamide  200 mg Oral BID  . mirtazapine  15 mg Oral QHS  . oseltamivir   30 mg Oral BID  . pantoprazole  20 mg Oral Daily  . predniSONE  50 mg Oral Q breakfast  . pregabalin  75 mg Oral BID  . tamsulosin  0.8 mg Oral QPC breakfast  . tiotropium  18 mcg Inhalation Daily  . Warfarin - Pharmacist Dosing Inpatient   Does not apply q1800   Continuous Infusions: . ceFEPime (MAXIPIME) IV 2 g (02/27/17 1712)   PRN Meds:.acetaminophen **OR** acetaminophen, bisacodyl, calcium carbonate, fluticasone, HYDROcodone-acetaminophen, hydroxypropyl methylcellulose / hypromellose, ipratropium-albuterol, ondansetron **OR** ondansetron (ZOFRAN) IV, senna-docusate Medications Prior to Admission:  Prior to Admission medications   Medication Sig Start Date End Date Taking? Authorizing Provider  amoxicillin-clavulanate (AUGMENTIN) 875-125 MG tablet Take 1 tablet by mouth every 12 (  twelve) hours. 02/21/17  Yes Sudini, Wardell Heath, MD  bisoprolol (ZEBETA) 5 MG tablet Take 0.5 tablets (2.5 mg total) by mouth 2 (two) times daily. 05/11/16  Yes End, Cristal Deer, MD  calcium carbonate (TUMS - DOSED IN MG ELEMENTAL CALCIUM) 500 MG chewable tablet Chew 1 tablet (200 mg of elemental calcium total) by mouth 3 (three) times daily with meals as needed for indigestion or heartburn. 02/24/16  Yes Gouru, Deanna Artis, MD  diazepam (VALIUM) 5 MG tablet Take 5 mg by mouth 2 (two) times daily.   Yes [provider]  ezetimibe-simvastatin (VYTORIN) 10-80 MG tablet Take 1 tablet by mouth daily.   Yes [provider]  fluticasone (FLONASE) 50 MCG/ACT nasal spray Place 2 sprays into both nostrils daily as needed for allergies.    Yes [provider]  hydroxypropyl methylcellulose / hypromellose (ISOPTO TEARS / GONIOVISC) 2.5 % ophthalmic solution Place 1 drop into both eyes 3 (three) times daily as needed for dry eyes.   Yes [provider]  ipratropium-albuterol (DUONEB) 0.5-2.5 (3) MG/3ML SOLN Take 3 mLs by nebulization every 6 (six) hours as needed. 02/21/17  Yes Sudini, Wardell Heath, MD    lacosamide (VIMPAT) 200 MG TABS tablet Take 1 tablet (200 mg total) by mouth 2 (two) times daily. 02/22/17  Yes Sudini, Wardell Heath, MD  mirtazapine (REMERON) 15 MG tablet Take 15 mg by mouth at bedtime.   Yes [provider]  ondansetron (ZOFRAN ODT) 8 MG disintegrating tablet Take 1 tablet (8 mg total) by mouth every 8 (eight) hours as needed for nausea or vomiting. 04/13/14  Yes Kirichenko, Tatyana, PA-C  pantoprazole (PROTONIX) 20 MG tablet Take 20 mg by mouth daily.   Yes [provider]  predniSONE (STERAPRED UNI-PAK 21 TAB) 10 MG (21) TBPK tablet 6 tabs day 1 and taper 1 tab a day - 6 days 02/22/17  Yes Sudini, Srikar, MD  pregabalin (LYRICA) 75 MG capsule Take 75 mg by mouth 2 (two) times daily.   Yes [provider]  tamsulosin (FLOMAX) 0.4 MG CAPS capsule Take 2 capsules (0.8 mg total) by mouth daily after breakfast. 02/10/17  Yes Penny Pia, MD  tiotropium (SPIRIVA) 18 MCG inhalation capsule Place 18 mcg into inhaler and inhale daily.   Yes [provider]  warfarin (COUMADIN) 2.5 MG tablet Take 5 mg by mouth daily.   Yes [provider]  warfarin (COUMADIN) 2.5 MG tablet TAKE 1 OR 2 TABLETS BY MOUTH EVERY DAY OR AS PHYSICIAN INSTRUCTED BY COUMADIN CLINIC Patient not taking: Reported on 02/24/2017 09/25/16   End, Cristal Deer, MD   Allergies  Allergen Reactions  . Ativan [Lorazepam] Anaphylaxis  . Aspirin Other (See Comments)    CAUSES ULCERS  . Codeine Nausea And Vomiting  . Oxycodone Nausea And Vomiting   Review of Systems  Neurological: Positive for weakness.    Physical Exam  Constitutional: He appears cachectic. He appears ill.  Cardiovascular: Normal rate, regular rhythm and normal heart sounds.  Pulmonary/Chest: He has decreased breath sounds in the right lower field and the left lower field.  Musculoskeletal:  Generalized weakness  Neurological: He is alert.  Skin: Skin is warm and dry.    Vital Signs: BP 128/70 (BP Location:  Right Arm)   Pulse 80   Temp 98 F (36.7 C) (Oral)   Resp 20   Ht 5\' 9"  (1.753 m)   Wt 67.2 kg (148 lb 2.4 oz)   SpO2 96%   BMI 21.88 kg/m  Pain Assessment: No/denies pain  Pain Score: Asleep   SpO2: SpO2: 96 % O2 Device:SpO2: 96 % O2 Flow Rate: .O2 Flow Rate (L/min): 2 L/min  IO: Intake/output summary:   Intake/Output Summary (Last 24 hours) at 02/27/2017 1714 Last data filed at 02/27/2017 [id number] Gross per 24 hour  Intake 340 ml  Output -  Net 340 ml    LBM:   Baseline Weight: Weight: 63 kg (139 lb) Most recent weight: Weight: 67.2 kg (148 lb 2.4 oz)     Palliative Assessment/Data: 40 % at best   Discussed with case manager   Time In: 1345 Time Out: 1500 Time Total: 75 min Greater than 50%  of this time was spent counseling and coordinating care related to the above assessment and plan.  Signed by: Lorinda CreedMary Tallan Sandoz, NP   Please contact Palliative Medicine Team phone at [phone] for questions and concerns.  For individual provider: See Loretha StaplerAmion

## 2017-02-27 NOTE — Progress Notes (Signed)
CSW received update this morning from [facility] that they have spoken with the patient's family multiple times on why they are not able to meet their expectations and they are declining a bed offer for the patient. CSW updated patient's daughter, Zachary Peterson, at bedside, and she says that the family will take him home since [facility] is unable to offer for him. Patient had been active with [facility] prior to admission, and will continue to follow with them at discharge and return home. CSW alerted RNCM.  Patient and family refusing SNF at this time. CSW signing off.  Zachary Peterson, [place] Clinical Social Worker [phone]

## 2017-02-27 NOTE — Progress Notes (Signed)
PROGRESS NOTE    Zachary Peterson   UJW:[id number]  DOB: 06-12-1931  DOA: 02/24/2017 PCP: Ignacia Palma., MD   Brief Narrative:  DMARCUS Peterson is an 84 y.o. male with a PMH of COPD, PAF, chronic systolic CHF, seizure disorder and hypertension who was admitted 02/24/17 for evaluation of somnolence in the setting of discharge from [facility] on 02/24/16 where he was treated for multifocal pneumonia. At the time of his discharge, family declined SNF placement. In the ED, he was placed on empiric vancomycin and cefepime for concerns of worsening pneumonia. Family is interested in palliative care at this point, and a consult has been requested.  Subjective: The patient states he is ready to go home. He has a cough with is productive of a small amount of sputum ROS: no complaints of nausea, vomiting, constipation diarrhea, cough, dyspnea or dysuria. No other complaints.   Assessment & Plan:   Principal Problem: Multifocal pneumonia Influenza A COPD -The patient was noted to be influenza positive and is being treated with Tamiflu -He was also treated for pneumonia with Vanco and Zosyn and subsequently Augmentin but did not improve -Current suspicion is that he may have cryptogenic organizing pneumonia and steroids were started yesterday- another suspicion is he may have aspiration pneumonia -Most recent imaging revealed infiltrates consistent with multifocal pneumonia -I have asked for a pulmonary eval to help [place] further determine the duration of treatment -He continues to require oxygen and has a cough as well -Continue treatment with bronchodilators for COPD  Active Problems: Pseudomonas UTI with bacteremia -UTI related to Foley catheter/in and out catheterizations for urinary retention -He has been on cefepime since 2/17 and this can be continued for now  Urinary retention -The patient has required in and out catheterizations at home and currently has a Foley catheter -His daughter would like  to see if he is able to void on his own and today we will give him a voiding trial -Continue Flomax    Seizure  -Continue Vimpat    PAF (paroxysmal atrial fibrillation)  -CHA2DS2-VASc Score 3 -Continue warfarin-rate controlled on beta-blocker    Benign essential HTN -Stable on bisoprolol    Failure to thrive in adult -Due to multiple illnesses, the patient and family would like to speak with palliative care and they are interested in taking the patient home with hospice    Chronic systolic CHF (congestive heart failure)  -EF 45-50%-compensated  Severe protein calorie malnutrition -Continue dietary supplements   DVT prophylaxis: Coumadin Code Status: DO NOT RESUSCITATE Family Communication: Daughter Disposition Plan: Family would like to take him home with hospice Consultants:   Pulmonary Procedures:    Antimicrobials:  Anti-infectives (From admission, onward)   Start     Dose/Rate Route Frequency Ordered Stop   02/26/17 1800  ceFEPIme (MAXIPIME) 2 g in sodium chloride 0.9 % 100 mL IVPB     2 g 200 mL/hr over 30 Minutes Intravenous Every 12 hours 02/26/17 0956     02/25/17 2200  oseltamivir (TAMIFLU) capsule 30 mg     30 mg Oral 2 times daily 02/25/17 1837 03/02/17 0959   02/25/17 1000  oseltamivir (TAMIFLU) capsule 75 mg  Status:  Discontinued     75 mg Oral Daily 02/25/17 0938 02/25/17 1837   02/25/17 0600  ceFEPIme (MAXIPIME) 1 g in sodium chloride 0.9 % 100 mL IVPB  Status:  Discontinued     1 g 200 mL/hr over 30 Minutes Intravenous Every 12 hours 02/24/17 1800  02/26/17 0956   02/25/17 0600  vancomycin (VANCOCIN) 500 mg in sodium chloride 0.9 % 100 mL IVPB  Status:  Discontinued     500 mg 100 mL/hr over 60 Minutes Intravenous Every 12 hours 02/24/17 1800 02/25/17 0852   02/24/17 1800  ceFEPIme (MAXIPIME) 2 g in sodium chloride 0.9 % 100 mL IVPB     2 g 200 mL/hr over 30 Minutes Intravenous  Once 02/24/17 1745 02/24/17 2031   02/24/17 1800  vancomycin (VANCOCIN)  IVPB 1000 mg/200 mL premix     1,000 mg 200 mL/hr over 60 Minutes Intravenous  Once 02/24/17 1745 02/24/17 1927       Objective: Vitals:   02/26/17 2333 02/27/17 0502 02/27/17 0834 02/27/17 1046  BP: 129/61 131/68  (!) 134/55  Pulse: 74 71  77  Resp: 20 20  20   Temp: 97.8 F (36.6 C) (!) 97.3 F (36.3 C)  98.9 F (37.2 C)  TempSrc: Oral Oral  Oral  SpO2: 96% 98% 90% 95%  Weight:  67.2 kg (148 lb 2.4 oz)    Height:        Intake/Output Summary (Last 24 hours) at 02/27/2017 1529 Last data filed at 02/27/2017 1610 Gross per 24 hour  Intake 340 ml  Output 300 ml  Net 40 ml   Filed Weights   02/24/17 1609 02/26/17 0440 02/27/17 0502  Weight: 63 kg (139 lb) 65 kg (143 lb 4.8 oz) 67.2 kg (148 lb 2.4 oz)    Examination: General exam: Appears comfortable  HEENT: PERRLA, oral mucosa moist, no sclera icterus or thrush Respiratory system:  Respiratory effort normal.  Mild rhonchi are present bilaterally Cardiovascular system: S1 & S2 heard, RRR.  No murmurs  Gastrointestinal system: Abdomen soft, non-tender, nondistended. Normal bowel sound. No organomegaly Central nervous system: Alert and oriented. No focal neurological deficits. Extremities: No cyanosis, clubbing or edema Skin: No rashes or ulcers Psychiatry:  Mood & affect appropriate.     Data Reviewed: I have personally reviewed following labs and imaging studies  CBC: Recent Labs  Lab 02/24/17 1705 02/25/17 0308 02/26/17 0417  WBC 24.4* 23.6* 17.7*  NEUTROABS 21.9* 20.1*  --   HGB 11.5* 10.9* 10.1*  HCT 35.1* 33.5* 31.6*  MCV 94.9 95.2 96.3  PLT 240 231 251   Basic Metabolic Panel: Recent Labs  Lab 02/21/17 1545 02/24/17 1705 02/25/17 0308 02/26/17 0417  NA 134* 136 139 137  K 4.3 4.4 4.2 4.1  CL 101 104 107 106  CO2 24 22 22 22   GLUCOSE 112* 148* 92 125*  BUN 17 22* 20 21*  CREATININE 1.01 1.15 0.93 0.96  CALCIUM 8.0* 8.5* 8.2* 8.1*  PHOS 3.3  --   --   --    GFR: Estimated Creatinine  Clearance: 55.4 mL/min (by C-G formula based on SCr of 0.96 mg/dL). Liver Function Tests: No results for input(s): AST, ALT, ALKPHOS, BILITOT, PROT, ALBUMIN in the last 168 hours. No results for input(s): LIPASE, AMYLASE in the last 168 hours. No results for input(s): AMMONIA in the last 168 hours. Coagulation Profile: Recent Labs  Lab 02/22/17 0401 02/23/17 0455 02/25/17 0308 02/26/17 0417 02/27/17 0442  INR 1.49 1.59 2.34 1.80 2.07   Cardiac Enzymes: No results for input(s): CKTOTAL, CKMB, CKMBINDEX, TROPONINI in the last 168 hours. BNP (last 3 results) No results for input(s): PROBNP in the last 8760 hours. HbA1C: No results for input(s): HGBA1C in the last 72 hours. CBG: Recent Labs  Lab 02/26/17 [phone] 02/27/17 0745  GLUCAP 105* 85   Lipid Profile: No results for input(s): CHOL, HDL, LDLCALC, TRIG, CHOLHDL, LDLDIRECT in the last 72 hours. Thyroid Function Tests: No results for input(s): TSH, T4TOTAL, FREET4, T3FREE, THYROIDAB in the last 72 hours. Anemia Panel: No results for input(s): VITAMINB12, FOLATE, FERRITIN, TIBC, IRON, RETICCTPCT in the last 72 hours. Urine analysis:    Component Value Date/Time   COLORURINE YELLOW 02/24/2017 2040   APPEARANCEUR CLOUDY (A) 02/24/2017 2040   LABSPEC 1.026 02/24/2017 2040   PHURINE 6.0 02/24/2017 2040   GLUCOSEU NEGATIVE 02/24/2017 2040   HGBUR MODERATE (A) 02/24/2017 2040   BILIRUBINUR NEGATIVE 02/24/2017 2040   KETONESUR NEGATIVE 02/24/2017 2040   PROTEINUR 100 (A) 02/24/2017 2040   UROBILINOGEN 0.2 05/17/2009 1257   NITRITE NEGATIVE 02/24/2017 2040   LEUKOCYTESUR LARGE (A) 02/24/2017 2040   Sepsis Labs: @LABRCNTIP (procalcitonin:4,lacticidven:4) ) Recent Results (from the past 240 hour(s))  Blood Culture (routine x 2)     Status: None (Preliminary result)   Collection Time: 02/24/17  6:10 PM  Result Value Ref Range Status   Specimen Description BLOOD LEFT HAND  Final   Special Requests IN PEDIATRIC BOTTLE Blood  Culture adequate volume  Final   Culture   Final    NO GROWTH 3 DAYS Performed at [facility], [address] [place], [place] [address]    Report Status PENDING  Incomplete  Blood Culture (routine x 2)     Status: Abnormal   Collection Time: 02/24/17  6:16 PM  Result Value Ref Range Status   Specimen Description BLOOD RIGHT ANTECUBITAL  Final   Special Requests IN PEDIATRIC BOTTLE Blood Culture adequate volume  Final   Culture  Setup Time   Final    GRAM NEGATIVE RODS IN PEDIATRIC BOTTLE CRITICAL RESULT CALLED TO, READ BACK BY AND VERIFIED WITH: Virgel Gess [facility] 2118 02/25/17 A BROWNING Performed at [facility], [address] [place], [place] [address]    Culture PSEUDOMONAS AERUGINOSA (A)  Final   Report Status 02/27/2017 FINAL  Final   Organism ID, Bacteria PSEUDOMONAS AERUGINOSA  Final      Susceptibility   Pseudomonas aeruginosa - MIC*    CEFTAZIDIME 4 SENSITIVE Sensitive     CIPROFLOXACIN <=0.25 SENSITIVE Sensitive     GENTAMICIN <=1 SENSITIVE Sensitive     IMIPENEM 1 SENSITIVE Sensitive     PIP/TAZO 8 SENSITIVE Sensitive     CEFEPIME 2 SENSITIVE Sensitive     * PSEUDOMONAS AERUGINOSA  Urine culture     Status: Abnormal (Preliminary result)   Collection Time: 02/24/17  7:03 PM  Result Value Ref Range Status   Specimen Description URINE, CATHETERIZED  Final   Special Requests NONE  Final   Culture (A)  Final    20,000 COLONIES/mL PSEUDOMONAS AERUGINOSA SUSCEPTIBILITIES TO FOLLOW Performed at [facility], [address], [place], [place] [address]    Report Status PENDING  Incomplete         Radiology Studies: No results found.    Scheduled Meds: . bisoprolol  2.5 mg Oral BID  . diazepam  5 mg Oral Q12H  . ezetimibe-simvastatin  1 tablet Oral q1800  . guaiFENesin  600 mg Oral BID  . lacosamide  200 mg Oral BID  . mirtazapine  15 mg Oral QHS  . oseltamivir  30 mg Oral BID  . pantoprazole  20 mg Oral Daily  . predniSONE  50 mg Oral  Q breakfast  . pregabalin  75 mg  Oral BID  . tamsulosin  0.8 mg Oral QPC breakfast  . tiotropium  18 mcg Inhalation Daily  . warfarin  5 mg Oral ONCE-1800  . Warfarin - Pharmacist Dosing Inpatient   Does not apply q1800   Continuous Infusions: . ceFEPime (MAXIPIME) IV 2 g (02/27/17 0504)     LOS: 2 days    Time spent in minutes: 35    Calvert CantorSaima Kingdom Vanzanten, MD [facility] Pager: [email or web address] Password [email or web address] [email or web address] 02/27/2017, 3:29 PM

## 2017-02-27 NOTE — Progress Notes (Signed)
CSW alerted by PMT that patient's family would be agreeable to SNF only if the patient could be placed at [facility] in [place]. Per PMT NP Stanton Kidney, the patient was recently at [facility] and the family did not understand why the patient was not able to go to [facility] and they had to take him home instead.   Per chart review, the patient has had multiple readmissions recently (see full assessment below, completed on 02/08/17), and has been placed at [facility] and at [facility]. CSW will follow up on completing referral, and speaking with the family afterwards to determine placement.  Laveda Abbe, [place] Clinical Social Worker [phone]    Clinical Social Work Assessment  Patient Details  Name: Zachary Peterson MRN: [id number] Date of Birth: [birth year]  Date of referral:  02/08/17               Reason for consult:  Facility Placement, Discharge Planning                    Permission sought to share information with:  Facility Sport and exercise psychologist, Family Supports Permission granted to share information::  Yes, Verbal Permission Granted             Name::     Janathan Bribiesca             Agency::  SNF's             Relationship::  Spouse             Contact Information:  [phone]  Housing/Transportation Living arrangements for the past 2 months:  Single Family Home Source of Information:  Medical Team, Adult Children, Spouse Patient Interpreter Needed:  None Criminal Activity/Legal Involvement Pertinent to Current Situation/Hospitalization:  No - Comment as needed Significant Relationships:  Adult Children, Spouse Lives with:  Spouse Do you feel safe going back to the place where you live?  Yes Need for family participation in patient care:  Yes (Comment)  Care giving concerns:  Insurance denied CIR. Patient's wife and daughters now agreeable to SNF placement.   Social Worker assessment / plan:  Patient not fully oriented. CSW met with patient's wife and two  daughters. CSW introduced role and explained that PT recommendations would be discussed. Insurance has denied CIR admission. They are agreeable to SNF placement now. First preference is [facility], second is [facility], and third is [facility]. [facility] admissions coordinator will review referral to see if patient is appropriate for their facility and determine if they will have a bed today. CSW has started insurance authorization and will call them with facility choice once secured. No further concerns. CSW encouraged patient's wife and daughters to contact CSW as needed. CSW will continue to follow patient and his family for support and facilitate discharge to SNF once insurance authorization obtained.  Employment status:  Retired Nurse, adult PT Recommendations:  [facility] / Referral to community resources:  [place]  Patient/Family's Response to care:  Patient not fully oriented. Patient's wife and daughters agreeable to SNF placement. Patient's family supportive and involved in patient's care. Patient's wife and daughters appreciated social work intervention.  Patient/Family's Understanding of and Emotional Response to Diagnosis, Current Treatment, and Prognosis:  Patient not fully oriented. Patient's wife and daughters have a good understanding of the reason for admission and his need for rehab prior to returning home. Patient's wife and daughters appear happy with hospital care.  Emotional Assessment Appearance:  Appears stated age Attitude/Demeanor/Rapport:  Unable to Assess Affect (typically observed):  Unable to Assess Orientation:  Oriented to Self, Oriented to Place Alcohol / Substance use:  Never Used Psych involvement (Current and /or in the community):  No (Comment)  Discharge Needs  Concerns to be addressed:  Care Coordination Readmission within the last 30 days:  No Current  discharge risk:  Cognitively Impaired, Dependent with Mobility Barriers to Discharge:  [place], LCSW 02/08/2017, 11:25 AM

## 2017-02-27 NOTE — Consult Note (Signed)
Name: Zachary Peterson MRN: [id number] DOB: 1931/08/18    ADMISSION DATE:  02/24/2017 CONSULTATION DATE:  2/20  REFERRING MD :  TRH  CHIEF COMPLAINT:  Respiratory failure, COP v HCAP  BRIEF PATIENT DESCRIPTION: 84 year old male with history of COPD, PAF, chronic systolic CHF, hypertension, seizure disorder with multiple recent hospitalizations.  Most recently presented 2/17 with somnolence and worsening shortness of breath.  Had previous admission 1 month prior with aspiration pneumonia, family refused SNF on discharge and took patient home he was seen by his home health RN who noticed he had increased somnolence and bilateral rhonchi.  ER workup was suspicious for multifocal pneumonia with WBC 24,000.  Lactate was normal and he was admitted by the hospitalist and started on empiric vancomycin and cefepime for HCAP versus aspiration pneumonia and overall failure to thrive.  Patient was also flu a positive and was treated with Tamiflu.  Course has been complicated by gram-negative rod bacteremia.  CT chest showed multifocal pneumonia with possible underlying cryptogenic organizing pneumonia and pulmonary was consulted for further recommendations.  SIGNIFICANT EVENTS    STUDIES:  CT chest 2/17>>> 1. Patchy bilateral pulmonary infiltrates most prominent in the bases but also involving the right upper lobe most likely represent an infectious process/multifocal pneumonia. Cryptogenic organizing pneumonia could have a similar appearance. Recommend follow-up to resolution. 2. Atherosclerotic changes in the thoracic aorta. 3. Coronary artery calcifications. 4. No other acute abnormalities.   HISTORY OF PRESENT ILLNESS:  84 year old male with history of COPD, PAF, chronic systolic CHF, hypertension, seizure disorder with multiple recent hospitalizations.  Most recently presented 2/17 with somnolence and worsening shortness of breath.  Had previous admission 1 month prior with aspiration pneumonia,  family refused SNF on discharge and took patient home he was seen by his home health RN who noticed he had increased somnolence and bilateral rhonchi.  ER workup was suspicious for multifocal pneumonia with WBC 24,000.  Lactate was normal and he was admitted by the hospitalist and started on empiric vancomycin and cefepime for HCAP versus aspiration pneumonia and overall failure to thrive.  Patient was also flu a positive and was treated with Tamiflu.  Course has been complicated by gram-negative rod bacteremia.  CT chest showed multifocal pneumonia with possible underlying cryptogenic organizing pneumonia and pulmonary was consulted for further recommendations.  Feeling a bit better.  Still some SOB but improving.  Denies dysphagia, chest pain, purulent sputum, hemoptysis.  Some recent weight loss but cannot quantify.    PAST MEDICAL HISTORY :   has a past medical history of Back pain, COPD (chronic obstructive pulmonary disease) (HCC), Depression, Fatigue, Hearing loss, History of GI bleed (1976), Hyperlipemia, Hypertension, Migraine, Palpitations, Persistent atrial fibrillation (HCC), Pneumonia, Seizure (HCC), and Syncope.  has a past surgical history that includes Hernia repair; Appendectomy; Back surgery; Cataract extraction; hemmoridectomy; TEE without cardioversion (N/A, 06/06/2016); and CARDIOVERSION (N/A, 06/06/2016). Prior to Admission medications   Medication Sig Start Date End Date Taking? Authorizing Provider  amoxicillin-clavulanate (AUGMENTIN) 875-125 MG tablet Take 1 tablet by mouth every 12 (twelve) hours. 02/21/17  Yes Sudini, Wardell Heath, MD  bisoprolol (ZEBETA) 5 MG tablet Take 0.5 tablets (2.5 mg total) by mouth 2 (two) times daily. 05/11/16  Yes End, Cristal Deer, MD  calcium carbonate (TUMS - DOSED IN MG ELEMENTAL CALCIUM) 500 MG chewable tablet Chew 1 tablet (200 mg of elemental calcium total) by mouth 3 (three) times daily with meals as needed for indigestion or heartburn. 02/24/16  Yes  Ramonita Lab, MD  diazepam (VALIUM) 5 MG tablet Take 5 mg by mouth 2 (two) times daily.   Yes [provider]  ezetimibe-simvastatin (VYTORIN) 10-80 MG tablet Take 1 tablet by mouth daily.   Yes [provider]  fluticasone (FLONASE) 50 MCG/ACT nasal spray Place 2 sprays into both nostrils daily as needed for allergies.    Yes [provider]  hydroxypropyl methylcellulose / hypromellose (ISOPTO TEARS / GONIOVISC) 2.5 % ophthalmic solution Place 1 drop into both eyes 3 (three) times daily as needed for dry eyes.   Yes [provider]  ipratropium-albuterol (DUONEB) 0.5-2.5 (3) MG/3ML SOLN Take 3 mLs by nebulization every 6 (six) hours as needed. 02/21/17  Yes Sudini, Wardell Heath, MD  lacosamide (VIMPAT) 200 MG TABS tablet Take 1 tablet (200 mg total) by mouth 2 (two) times daily. 02/22/17  Yes Sudini, Wardell Heath, MD  mirtazapine (REMERON) 15 MG tablet Take 15 mg by mouth at bedtime.   Yes [provider]  ondansetron (ZOFRAN ODT) 8 MG disintegrating tablet Take 1 tablet (8 mg total) by mouth every 8 (eight) hours as needed for nausea or vomiting. 04/13/14  Yes Kirichenko, Tatyana, PA-C  pantoprazole (PROTONIX) 20 MG tablet Take 20 mg by mouth daily.   Yes [provider]  predniSONE (STERAPRED UNI-PAK 21 TAB) 10 MG (21) TBPK tablet 6 tabs day 1 and taper 1 tab a day - 6 days 02/22/17  Yes Sudini, Srikar, MD  pregabalin (LYRICA) 75 MG capsule Take 75 mg by mouth 2 (two) times daily.   Yes [provider]  tamsulosin (FLOMAX) 0.4 MG CAPS capsule Take 2 capsules (0.8 mg total) by mouth daily after breakfast. 02/10/17  Yes Penny Pia, MD  tiotropium (SPIRIVA) 18 MCG inhalation capsule Place 18 mcg into inhaler and inhale daily.   Yes [provider]  warfarin (COUMADIN) 2.5 MG tablet Take 5 mg by mouth daily.   Yes [provider]  warfarin (COUMADIN) 2.5 MG tablet TAKE 1 OR 2 TABLETS BY MOUTH EVERY DAY OR AS PHYSICIAN INSTRUCTED BY  COUMADIN CLINIC Patient not taking: Reported on 02/24/2017 09/25/16   End, Cristal Deer, MD   Allergies  Allergen Reactions  . Ativan [Lorazepam] Anaphylaxis  . Aspirin Other (See Comments)    CAUSES ULCERS  . Codeine Nausea And Vomiting  . Oxycodone Nausea And Vomiting    FAMILY HISTORY:  family history includes Heart disease in his mother; Hypertension in his father. SOCIAL HISTORY:  reports that he has quit smoking. He has quit using smokeless tobacco. He reports that he does not drink alcohol or use drugs.  REVIEW OF SYSTEMS:   As per HPI - All other systems reviewed and were neg.    SUBJECTIVE:   VITAL SIGNS: Temp:  [97.3 F (36.3 C)-98.9 F (37.2 C)] 98.9 F (37.2 C) (02/20 1046) Pulse Rate:  [71-79] 77 (02/20 1046) Resp:  [20] 20 (02/20 1046) BP: (124-151)/(55-68) 134/55 (02/20 1046) SpO2:  [90 %-98 %] 95 % (02/20 1046) Weight:  [67.2 kg (148 lb 2.4 oz)] 67.2 kg (148 lb 2.4 oz) (02/20 0502)  PHYSICAL EXAMINATION: General:  Frail, chronically ill appearing male, thin, NAD  Neuro:  Sleeping, wakes easily but still a bit drowsy, weak, appropriate  HEENT:  Mm moist, no JVD  Cardiovascular:  s1s2 rrr Lungs:  resps even non labored on [place], coarse  Abdomen:  Round, soft, +bs  Musculoskeletal:  Warm and dry, no sig edema   Recent Labs  Lab 02/24/17 1705 02/25/17 0308 02/26/17 0417  NA  136 139 137  K 4.4 4.2 4.1  CL 104 107 106  CO2 22 22 22   BUN 22* 20 21*  CREATININE 1.15 0.93 0.96  GLUCOSE 148* 92 125*   Recent Labs  Lab 02/24/17 1705 02/25/17 0308 02/26/17 0417  HGB 11.5* 10.9* 10.1*  HCT 35.1* 33.5* 31.6*  WBC 24.4* 23.6* 17.7*  PLT 240 231 251   No results found.  ASSESSMENT / PLAN:  HCAP/recurrent aspiration v ?COP - has had recurrent aspiration issues, CXR similar to prior.  Clinically improving with abx, tamiflu.  Started on steroids for ?COP.  WBC improving. O2 needs improving.  Flu A  1/2 Blood culture with GNR  COPD   PLAN -  Continue  abx, steroids for now given clinical improvement  Consider repeat swallow eval given high suspicion for recurrent aspiration - ongoing goals of care discussions.  Continue spiriva, BD Supplemental O2 as needed to keep sats 88-92%  Noted DNR, failure to thrive - palliative care discussions ongoing, ?home with hospice  - if does go home with hospice could still continue steroids for ?COP as long as they are helping symptomatically but not clear that this is COP v recurrence of his aspiration/HCAP.    Dirk DressKaty Whiteheart, NP 02/27/2017  3:20 PM Pager: [phone] or [phone]

## 2017-02-27 NOTE — NC FL2 (Signed)
[place] MEDICAID FL2 LEVEL OF CARE SCREENING TOOL     IDENTIFICATION  Patient Name: Zachary RavelSamuel D Juran Birthdate: [birth year] Sex: male Admission Date (Current Location): 02/24/2017  [facility] [place]   Facility and Address:  [facility], [address], [place], [place] [address]      Provider Number: [id number]  Attending Physician Name and Address:  Calvert Cantorizwan, Saima, MD  Relative Name and Phone Number:       Current Level of Care: Hospital Recommended Level of Care: Skilled Nursing Facility Prior Approval Number:    Date Approved/Denied:   PASRR Number: [id number]  Discharge Plan: SNF    Current Diagnoses: Patient Active Problem List   Diagnosis Date Noted  . Bacteremia due to Gram-negative bacteria 02/26/2017  . Cryptogenic organizing pneumonia (HCC) 02/25/2017  . Influenza A 02/25/2017  . Failure to thrive in adult 02/24/2017  . Abnormal chest x-ray   . Chronic systolic CHF (congestive heart failure) (HCC)   . Palliative care by specialist   . DNR (do not resuscitate)   . Weakness generalized   . Protein-calorie malnutrition, severe 02/18/2017  . HCAP (healthcare-associated pneumonia) 02/15/2017  . Acute respiratory failure with hypoxia (HCC)   . Dysphagia   . Chronic obstructive pulmonary disease (HCC)   . PAF (paroxysmal atrial fibrillation) (HCC)   . Benign essential HTN   . Hypophosphatemia   . Leukocytosis   . Acute blood loss anemia   . Aspiration pneumonia (HCC) 01/27/2017  . Endotracheally intubated 01/27/2017  . Acute hypercapnic respiratory failure (HCC)   . At high risk for aspiration   . Persistent atrial fibrillation (HCC) 06/06/2016  . Encounter for therapeutic drug monitoring 03/14/2016  . Difficulty in walking, not elsewhere classified   . Muscle weakness (generalized)   . Diarrhea   . Seizure (HCC) 02/21/2016  . Atrial fibrillation with RVR (HCC) 02/21/2016  . Viral gastroenteritis 02/21/2016   . Hypokalemia 02/21/2016  . Demand ischemia of myocardium (HCC) 02/21/2016  . Essential hypertension 02/21/2016    Orientation RESPIRATION BLADDER Height & Weight     Self, Time, Place  O2([place] 2L) Continent Weight: 148 lb 2.4 oz (67.2 kg) Height:  5\' 9"  (175.3 cm)  BEHAVIORAL SYMPTOMS/MOOD NEUROLOGICAL BOWEL NUTRITION STATUS    Convulsions/Seizures Continent Diet(mechanical soft)  AMBULATORY STATUS COMMUNICATION OF NEEDS Skin   Extensive Assist Verbally Normal                       Personal Care Assistance Level of Assistance  Bathing, Feeding, Dressing Bathing Assistance: Maximum assistance Feeding assistance: Limited assistance Dressing Assistance: Maximum assistance     Functional Limitations Info  Sight, Hearing, Speech Sight Info: Adequate Hearing Info: Adequate Speech Info: Adequate    SPECIAL CARE FACTORS FREQUENCY  PT (By licensed PT), OT (By licensed OT), Speech therapy     PT Frequency: 5x/wk OT Frequency: 5x/wk     Speech Therapy Frequency: 5x/wk      Contractures Contractures Info: Not present    Additional Factors Info  Code Status, Allergies, Psychotropic, Isolation Precautions Code Status Info: DNR Allergies Info: Ativan Lorazepam, Aspirin, Codeine, Oxycodone Psychotropic Info: Remeron 15mg  daily at bedtime   Isolation Precautions Info: Droplet precautions: Flu     Current Medications (02/27/2017):  This is the current hospital active medication list Current Facility-Administered Medications  Medication Dose Route Frequency Provider Last Rate Last Dose  . acetaminophen (TYLENOL) tablet 650 mg  650 mg Oral Q6H  PRN Briscoe Deutscher, MD       Or  . acetaminophen (TYLENOL) suppository 650 mg  650 mg Rectal Q6H PRN Opyd, Lavone Neri, MD      . bisacodyl (DULCOLAX) EC tablet 5 mg  5 mg Oral Daily PRN Opyd, Lavone Neri, MD      . bisoprolol (ZEBETA) tablet 2.5 mg  2.5 mg Oral BID Opyd, Lavone Neri, MD   2.5 mg at 02/26/17 2014  . calcium carbonate  (TUMS - dosed in mg elemental calcium) chewable tablet 200 mg of elemental calcium  1 tablet Oral TID WC PRN Opyd, Lavone Neri, MD   200 mg of elemental calcium at 02/25/17 1919  . ceFEPIme (MAXIPIME) 2 g in sodium chloride 0.9 % 100 mL IVPB  2 g Intravenous Q12H Almon Hercules, RPH 200 mL/hr at 02/27/17 0504 2 g at 02/27/17 0504  . diazepam (VALIUM) tablet 5 mg  5 mg Oral Q12H PRN Opyd, Lavone Neri, MD   5 mg at 02/26/17 2016  . ezetimibe-simvastatin (VYTORIN) 10-80 MG per tablet 1 tablet  1 tablet Oral q1800 Opyd, Lavone Neri, MD   1 tablet at 02/26/17 1822  . fluticasone (FLONASE) 50 MCG/ACT nasal spray 2 spray  2 spray Each Nare Daily PRN Opyd, Lavone Neri, MD      . HYDROcodone-acetaminophen (NORCO/VICODIN) 5-325 MG per tablet 1-2 tablet  1-2 tablet Oral Q4H PRN Opyd, Lavone Neri, MD      . hydroxypropyl methylcellulose / hypromellose (ISOPTO TEARS / GONIOVISC) 2.5 % ophthalmic solution 1 drop  1 drop Both Eyes TID PRN Opyd, Lavone Neri, MD      . ipratropium-albuterol (DUONEB) 0.5-2.5 (3) MG/3ML nebulizer solution 3 mL  3 mL Nebulization Q4H PRN Opyd, Lavone Neri, MD      . lacosamide (VIMPAT) tablet 200 mg  200 mg Oral BID Opyd, Lavone Neri, MD   200 mg at 02/26/17 2015  . mirtazapine (REMERON) tablet 15 mg  15 mg Oral QHS Opyd, Lavone Neri, MD   15 mg at 02/26/17 2016  . ondansetron (ZOFRAN) tablet 4 mg  4 mg Oral Q6H PRN Opyd, Lavone Neri, MD       Or  . ondansetron (ZOFRAN) injection 4 mg  4 mg Intravenous Q6H PRN Opyd, Lavone Neri, MD      . oseltamivir (TAMIFLU) capsule 30 mg  30 mg Oral BID Norva Pavlov, RPH   30 mg at 02/26/17 2017  . pantoprazole (PROTONIX) EC tablet 20 mg  20 mg Oral Daily Opyd, Lavone Neri, MD   20 mg at 02/26/17 1035  . predniSONE (DELTASONE) tablet 50 mg  50 mg Oral Q breakfast Rama, Maryruth Bun, MD   50 mg at 02/26/17 1035  . pregabalin (LYRICA) capsule 75 mg  75 mg Oral BID Briscoe Deutscher, MD   75 mg at 02/26/17 2016  . senna-docusate (Senokot-S) tablet 1 tablet  1 tablet Oral  QHS PRN Opyd, Lavone Neri, MD      . tamsulosin (FLOMAX) capsule 0.8 mg  0.8 mg Oral QPC breakfast Opyd, Lavone Neri, MD   0.8 mg at 02/26/17 1035  . tiotropium (SPIRIVA) inhalation capsule 18 mcg  18 mcg Inhalation Daily Opyd, Lavone Neri, MD   18 mcg at 02/27/17 0834  . Warfarin - Pharmacist Dosing Inpatient   Does not apply q1800 Opyd, Lavone Neri, MD         Discharge Medications: Please see discharge summary for a list of discharge medications.  Relevant  Imaging Results:  Relevant Lab Results:   Additional Information SS#: [id number]  Baldemar Lenis, LCSW

## 2017-02-28 DIAGNOSIS — A4152 Sepsis due to Pseudomonas: Secondary | ICD-10-CM

## 2017-02-28 LAB — CBC
HCT: 32.5 % — ABNORMAL LOW (ref 39.0–52.0)
Hemoglobin: 10.8 g/dL — ABNORMAL LOW (ref 13.0–17.0)
MCH: 30.5 pg (ref 26.0–34.0)
MCHC: 33.2 g/dL (ref 30.0–36.0)
MCV: 91.8 fL (ref 78.0–100.0)
PLATELETS: 359 10*3/uL (ref 150–400)
RBC: 3.54 MIL/uL — AB (ref 4.22–5.81)
RDW: 14.7 % (ref 11.5–15.5)
WBC: 13.4 10*3/uL — ABNORMAL HIGH (ref 4.0–10.5)

## 2017-02-28 LAB — BASIC METABOLIC PANEL
Anion gap: 11 (ref 5–15)
BUN: 18 mg/dL (ref 6–20)
CHLORIDE: 104 mmol/L (ref 101–111)
CO2: 21 mmol/L — AB (ref 22–32)
CREATININE: 0.9 mg/dL (ref 0.61–1.24)
Calcium: 8.2 mg/dL — ABNORMAL LOW (ref 8.9–10.3)
GFR calc non Af Amer: >60 mL/min (ref >60)
GLUCOSE: 96 mg/dL (ref 65–99)
Potassium: 3.9 mmol/L (ref 3.5–5.1)
Sodium: 136 mmol/L (ref 135–145)

## 2017-02-28 LAB — PROTIME-INR
INR: 1.91
PROTHROMBIN TIME: 21.7 s — AB (ref 11.4–15.2)

## 2017-02-28 LAB — URINE CULTURE: Culture: 20000 — AB

## 2017-02-28 LAB — GLUCOSE, CAPILLARY: Glucose-Capillary: 88 mg/dL (ref 65–99)

## 2017-02-28 MED ORDER — WARFARIN SODIUM 5 MG PO TABS: 5.0000 mg | ORAL_TABLET | Freq: Once | ORAL | Status: DC

## 2017-02-28 MED ORDER — SODIUM CHLORIDE 0.9 % IV SOLN
2.0000 g | Freq: Three times a day (TID) | INTRAVENOUS | Status: DC
  Filled 2017-02-28: qty 2

## 2017-02-28 MED ORDER — GUAIFENESIN ER 600 MG PO TB12: 600.0000 mg | ORAL_TABLET | Freq: Two times a day (BID) | ORAL | 0 refills | Status: DC | PRN

## 2017-02-28 MED ORDER — WARFARIN SODIUM 6 MG PO TABS: 6.0000 mg | ORAL_TABLET | Freq: Once | ORAL | Status: DC

## 2017-02-28 MED ORDER — OSELTAMIVIR PHOSPHATE 30 MG PO CAPS: 30.0000 mg | ORAL_CAPSULE | Freq: Two times a day (BID) | ORAL | 0 refills | Status: DC

## 2017-02-28 MED ORDER — SENNOSIDES-DOCUSATE SODIUM 8.6-50 MG PO TABS: 1.0000 | ORAL_TABLET | Freq: Every evening | ORAL | 0 refills | Status: AC | PRN

## 2017-02-28 MED ORDER — BUDESONIDE-FORMOTEROL FUMARATE 80-4.5 MCG/ACT IN AERO: 2.0000 | INHALATION_SPRAY | Freq: Two times a day (BID) | RESPIRATORY_TRACT | 12 refills | Status: AC

## 2017-02-28 MED ORDER — IPRATROPIUM-ALBUTEROL 0.5-2.5 (3) MG/3ML IN SOLN: 3.0000 mL | Freq: Four times a day (QID) | RESPIRATORY_TRACT | 0 refills | Status: AC

## 2017-02-28 MED ORDER — BISACODYL 5 MG PO TBEC: 5.0000 mg | DELAYED_RELEASE_TABLET | Freq: Every day | ORAL | 0 refills | Status: AC | PRN

## 2017-02-28 MED ORDER — CIPROFLOXACIN HCL 500 MG PO TABS: 500.0000 mg | ORAL_TABLET | Freq: Two times a day (BID) | ORAL | 0 refills | Status: AC

## 2017-02-28 MED ORDER — PREDNISONE 50 MG PO TABS: ORAL_TABLET | ORAL | 0 refills | Status: DC

## 2017-02-28 NOTE — Progress Notes (Signed)
PHARMACY NOTE:  ANTIMICROBIAL RENAL DOSAGE ADJUSTMENT  Current antimicrobial regimen includes a mismatch between antimicrobial dosage and estimated renal function.  As per policy approved by the Pharmacy & Therapeutics and Medical Executive Committees, the antimicrobial dosage will be adjusted accordingly.  Current antimicrobial dosage:  Cefepime 2g IV q12h  Indication: pseudomonas bacteremia, PNA  Renal Function:  Estimated Creatinine Clearance: 58.1 mL/min (by C-G formula based on SCr of 0.9 mg/dL).    Antimicrobial dosage has been changed to:  Cefepime 2g IV q8h  Babs BertinHaley Tymel Conely, PharmD, BCPS Clinical Pharmacist Clinical phone for 02/28/2017 until 3:30pm: [phone] If after 3:30pm, please call main pharmacy at: [phone] 02/28/2017 11:46 AM

## 2017-02-28 NOTE — Progress Notes (Signed)
Family and patient educated on discharge and follow up instructions. Family/caregiver able to teach back information and verbalize understanding. Discharge summary signed. No new questions or concerns. Home health/ palliative set up. IV removed. Foley inserted per family request and MD order for home use due to chronic catheter use and urinary retention.  Patient taken off unit for discharge by staff in wheelchair.

## 2017-02-28 NOTE — Progress Notes (Addendum)
ANTICOAGULATION CONSULT NOTE - Follow Up Consult  Pharmacy Consult for warfarin Indication: atrial fibrillation  Allergies  Allergen Reactions  . Ativan [Lorazepam] Anaphylaxis  . Aspirin Other (See Comments)    CAUSES ULCERS  . Codeine Nausea And Vomiting  . Oxycodone Nausea And Vomiting    Patient Measurements: Height: 5\' 9"  (175.3 cm) Weight: 145 lb 11.6 oz (66.1 kg) IBW/kg (Calculated) : 70.7 Heparin Dosing Weight: 63 kg  Vital Signs: Temp: (P) 97.8 F (36.6 C) (02/21 0800) Temp Source: (P) Oral (02/21 0800) BP: (P) 167/71 (02/21 0800) Pulse Rate: (P) 84 (02/21 0800)  Labs: Recent Labs    02/26/17 0417 02/27/17 0442 02/28/17 0400 02/28/17 0730  HGB 10.1*  --   --  10.8*  HCT 31.6*  --   --  32.5*  PLT 251  --   --  359  LABPROT 20.7* 23.1* 21.7*  --   INR 1.80 2.07 1.91  --   CREATININE 0.96  --   --  0.90    Estimated Creatinine Clearance: 58.1 mL/min (by C-G formula based on SCr of 0.9 mg/dL).   Medical History: Past Medical History:  Diagnosis Date  . Back pain   . COPD (chronic obstructive pulmonary disease) (HCC)   . Depression   . Fatigue    a. in setting of Afib, improved following DCCV.  [name]. Hearing loss   . History of GI bleed 1976  . Hyperlipemia   . Hypertension   . Migraine   . Palpitations   . Persistent atrial fibrillation (HCC)    a. 02/2016 Echo: EF 66-65%, mild MR;  b. CHA2DS2VASc = 3-->coumadin;  c. 05/2016 TEE/DCCV: EF 45-50%, triv AI, mild to mod MR, no LA/LAA/RA/RAA thrombus, mod to sev TR-->successful DCCV.  [name]. Pneumonia   . Seizure (HCC)   . Syncope    a. related to seizure disorder    Medications:  Scheduled:  . bisoprolol  2.5 mg Oral BID  . diazepam  5 mg Oral Q12H  . ezetimibe-simvastatin  1 tablet Oral q1800  . guaiFENesin  600 mg Oral BID  . lacosamide  200 mg Oral BID  . mirtazapine  15 mg Oral QHS  . oseltamivir  30 mg Oral BID  . pantoprazole  20 mg Oral Daily  . predniSONE  50 mg Oral Q breakfast  .  pregabalin  75 mg Oral BID  . tamsulosin  0.8 mg Oral QPC breakfast  . tiotropium  18 mcg Inhalation Daily  . Warfarin - Pharmacist Dosing Inpatient   Does not apply q1800    Assessment: [age] yom admitted with history of atrial fibrillation. On warfarin PTA - home regimen is 5 mg daily.   INR subtherapeutic at 1.91. CBC stable and no bleeding reported.  Goal of Therapy:  INR 2-3 Monitor platelets by anticoagulation protocol: Yes   Plan:  Warfarin 5 mg x 1 dose tonight Daily INR Monitor CBC, s/sx bleeding  Thank you for allowing us to participate in this patients care.  Verlin FesterAmber Heitor Steinhoff, PharmD 02/28/2017 8:57 AM

## 2017-02-28 NOTE — Progress Notes (Signed)
Pt discharging home with his family. Pt was active with [facility] prior to admission and family would like to continue with them. [facility]. Pt was also active with [facility] for Palliative care. CSW updated them of pts d/c.  Pt uses home oxygen but daughter did not bring a tank for transport home. Pt uses [facility] for his home oxygen and supplies. CM [facility]up[facility]Donna [facility] and she brought a tank to the room for transport.

## 2017-02-28 NOTE — Progress Notes (Signed)
CSW received voicemail from [place] with [facility] that the patient is being followed outpatient with the agency; they were supposed to meet with the patient and family at home for a palliative care consult after he left the hospital at his most recent admission, but the patient was admitted again. Per Diannia RuderKara, the palliative team will follow with the family once the patient returns home.  Blenda NicelyElizabeth Stephany Poorman, [place] Clinical Social Worker [phone]

## 2017-02-28 NOTE — Discharge Summary (Signed)
Physician Discharge Summary  Zachary Peterson ZOX:[id number] DOB: 07-Jun-1931 DOA: 02/24/2017  PCP: Ignacia Palma., MD  Admit date: 02/24/2017 Discharge date: 02/28/2017  Admitted From: home Disposition:  home   Recommendations for Outpatient Follow-up:  1. Palliative care to follow at home 2. Needs to f/u with urology in 2 wks  Discharge Condition:  stable   CODE STATUS:  DNR   Diet recommendation:  Heart healthy Consultations:  pulmonary    Discharge Diagnoses:  Principal Problem:   Cryptogenic organizing pneumonia (HCC) Active Problems:   Influenza A   Pseudomonal sepsis (HCC)   Seizure (HCC)   Chronic obstructive pulmonary disease (HCC)   PAF (paroxysmal atrial fibrillation) (HCC)   Benign essential HTN   HCAP (healthcare-associated pneumonia)   Failure to thrive in adult   Chronic systolic CHF (congestive heart failure) (HCC)   Palliative care encounter    Subjective: He has no complaints but is noted to have a cough on exam. No dyspnea. He states he is urinating fine after foley was removed yesterday. Wants to go home.   Brief Summary: Zachary Peterson is an 85 y.o.malewith a PMH of COPD on oxygen at home, PAF, chronic systolic CHF, seizure disorder and hypertension who was admitted 02/24/17 for evaluation of somnolence and dyspnea. He was admitted to [facility] from 2/8 through 2/15 where he was treated for septic shock and multifocal pneumonia with Vanc and Zosyn and discharged on Augmentin. He was also discharged with a indwelling foley.   At the time of his discharge, family declined SNF placement and went home with family on 2/15.  He was last admitted to [facility] from 1/19 through 2/22 for a seizure and also treated for aspiration pneumonia.  In the ED he was placed on empiric vancomycin and cefepime due to concerns of worsening pneumonia.    He was subsequently found to be positive for Influenza A,  Pseudomonas UTI and bacteremia.    Hospital Course:  Multifocal  pneumonia - possible Cryptogenic organizing pneumonia Influenza A COPD exacerabation -The patient was noted to be influenza positive and is being treated with Tamiflu -He was also treated for pneumonia at [facility] with Vanco and Zosyn and subsequently switched to Augmentin but did not improve - CT scan on 2/17>> Patchy bilateral pulmonary infiltrates most prominent in the bases but also involving the right upper lobe most likely represent an infectious process/multifocal pneumonia. Cryptogenic organizing pneumonia could have a similar appearance. -Current suspicion is that he may have cryptogenic organizing pneumonia and steroids were started a few days ago  - pulmonary recommends that he have a slow taper of 10 mg every 3-4 days -Continue treatment with bronchodilators for COPD- have added Symbicort as well - he is on home O2 at 2 L and is stable on this currently  Active Problems: Pseudomonas UTI with bacteremia -Complicated UTI related to Foley catheter/in and out catheterizations for urinary retention - suspect he was partially treated at [facility] with Zosyn -He has been on cefepime since 2/17 and this will be changed to Cipro on discharge to complete a 14 day course - WBC count was elevated at 24.4 on admission and has steadily improved with treatment to 13.4  Urinary retention -The patient has required in and out catheterizations at home for about 1 month now and was discharged from [facility] with a  Foley catheter   - given a voiding trail yesterday due to daughter's request however, he continues to retain urine and has had post void  residual of 800 cc and therefore the foley has been reinserted today- he has a very high risk of recurrent Pseudomona's UTI and this has been explained to his daughter today - will need urology follow up -Continue Flomax    Seizure disorder -Continue Vimpat    PAF (paroxysmal atrial fibrillation)  -CHA2DS2-VASc Score 3 -Continue warfarin-rate controlled on  beta-blocker    Benign essential HTN -Stable on bisoprolol    Failure to thrive in adult -Due to multiple illnesses, the patient and family would like to speak with palliative care and they are interested in taking the patient home with hospice  Chronic systolic CHF (congestive heart failure)  -EF 45-50%-compensated  Severe protein calorie malnutrition -Continue dietary supplements  Discharge Exam: Vitals:   02/28/17 0800 02/28/17 0831  BP: (!) (P) 167/71   Pulse: (P) 84   Resp: (P) 18   Temp: (P) 97.8 F (36.6 C)   SpO2: 93% 95%   Vitals:   02/28/17 0034 02/28/17 0400 02/28/17 0800 02/28/17 0831  BP:  (!) 171/70 (!) (P) 167/71   Pulse:  76 (P) 84   Resp:  18 (P) 18   Temp:  (!) 97.4 F (36.3 C) (P) 97.8 F (36.6 C)   TempSrc:  Oral (P) Oral   SpO2:  99% 93% 95%  Weight: 66.1 kg (145 lb 11.6 oz)     Height:        General: Pt is a frail elderly man who is alert, awake, not in acute distress Cardiovascular: RRR, S1/S2 +, no rubs, no gallops Respiratory: CTA bilaterally, no wheezing, no rhonchi- has a cough- is 95% on 2 L O2 Abdominal: Soft, NT, ND, bowel sounds + Extremities: no edema, no cyanosis   Discharge Instructions  Discharge Instructions    Diet - low sodium heart healthy   Complete by:  As directed    Increase activity slowly   Complete by:  As directed      Allergies as of 02/28/2017      Reactions   Ativan [lorazepam] Anaphylaxis   Aspirin Other (See Comments)   CAUSES ULCERS   Codeine Nausea And Vomiting   Oxycodone Nausea And Vomiting      Medication List    STOP taking these medications   amoxicillin-clavulanate 875-125 MG tablet Commonly known as:  AUGMENTIN   predniSONE 10 MG (21) Tbpk tablet Commonly known as:  STERAPRED UNI-PAK 21 TAB Replaced by:  predniSONE 50 MG tablet   tiotropium 18 MCG inhalation capsule Commonly known as:  SPIRIVA     TAKE these medications   bisacodyl 5 MG EC tablet Commonly known as:   DULCOLAX Take 1 tablet (5 mg total) by mouth daily as needed for moderate constipation.   bisoprolol 5 MG tablet Commonly known as:  ZEBETA Take 0.5 tablets (2.5 mg total) by mouth 2 (two) times daily.   budesonide-formoterol 80-4.5 MCG/ACT inhaler Commonly known as:  SYMBICORT Inhale 2 puffs into the lungs 2 (two) times daily.   calcium carbonate 500 MG chewable tablet Commonly known as:  TUMS - dosed in mg elemental calcium Chew 1 tablet (200 mg of elemental calcium total) by mouth 3 (three) times daily with meals as needed for indigestion or heartburn.   ciprofloxacin 500 MG tablet Commonly known as:  CIPRO Take 1 tablet (500 mg total) by mouth 2 (two) times daily for 10 days.   diazepam 5 MG tablet Commonly known as:  VALIUM Take 5 mg by mouth 2 (two) times daily.  ezetimibe-simvastatin 10-80 MG tablet Commonly known as:  VYTORIN Take 1 tablet by mouth daily.   fluticasone 50 MCG/ACT nasal spray Commonly known as:  FLONASE Place 2 sprays into both nostrils daily as needed for allergies.   guaiFENesin 600 MG 12 hr tablet Commonly known as:  MUCINEX Take 1 tablet (600 mg total) by mouth 2 (two) times daily as needed.   hydroxypropyl methylcellulose / hypromellose 2.5 % ophthalmic solution Commonly known as:  ISOPTO TEARS / GONIOVISC Place 1 drop into both eyes 3 (three) times daily as needed for dry eyes.   ipratropium-albuterol 0.5-2.5 (3) MG/3ML Soln Commonly known as:  DUONEB Take 3 mLs by nebulization 4 (four) times daily. What changed:    when to take this  reasons to take this   lacosamide 200 MG Tabs tablet Commonly known as:  VIMPAT Take 1 tablet (200 mg total) by mouth 2 (two) times daily.   mirtazapine 15 MG tablet Commonly known as:  REMERON Take 15 mg by mouth at bedtime.   ondansetron 8 MG disintegrating tablet Commonly known as:  ZOFRAN ODT Take 1 tablet (8 mg total) by mouth every 8 (eight) hours as needed for nausea or vomiting.    oseltamivir 30 MG capsule Commonly known as:  TAMIFLU Take 1 capsule (30 mg total) by mouth 2 (two) times daily.   pantoprazole 20 MG tablet Commonly known as:  PROTONIX Take 20 mg by mouth daily.   predniSONE 50 MG tablet Commonly known as:  DELTASONE Start 50 mg daily Taper by 10 mg every 3 days until finished Start taking on:  03/01/2017 Replaces:  predniSONE 10 MG (21) Tbpk tablet   pregabalin 75 MG capsule Commonly known as:  LYRICA Take 75 mg by mouth 2 (two) times daily.   senna-docusate 8.6-50 MG tablet Commonly known as:  Senokot-S Take 1 tablet by mouth at bedtime as needed for mild constipation.   tamsulosin 0.4 MG Caps capsule Commonly known as:  FLOMAX Take 2 capsules (0.8 mg total) by mouth daily after breakfast.   warfarin 2.5 MG tablet Commonly known as:  COUMADIN Take as directed. If you are unsure how to take this medication, talk to your nurse or doctor. Original instructions:  Take 5 mg by mouth daily.   warfarin 2.5 MG tablet Commonly known as:  COUMADIN Take as directed. If you are unsure how to take this medication, talk to your nurse or doctor. Original instructions:  TAKE 1 OR 2 TABLETS BY MOUTH EVERY DAY OR AS PHYSICIAN INSTRUCTED BY COUMADIN CLINIC       Allergies  Allergen Reactions  . Ativan [Lorazepam] Anaphylaxis  . Aspirin Other (See Comments)    CAUSES ULCERS  . Codeine Nausea And Vomiting  . Oxycodone Nausea And Vomiting     Procedures/Studies:    Ct Chest W Contrast  Result Date: 02/24/2017 CLINICAL DATA:  Acute respiratory illness.  History of pneumonia. EXAM: CT CHEST WITH CONTRAST TECHNIQUE: Multidetector CT imaging of the chest was performed during intravenous contrast administration. CONTRAST:  75mL ISOVUE-300 IOPAMIDOL (ISOVUE-300) INJECTION 61% COMPARISON:  Chest CT April 13, 2014 and chest x-ray February 24, 2017 FINDINGS: Cardiovascular: Atherosclerotic changes are seen in the thoracic aorta without dissection or  aneurysm. The heart is normal. Coronary artery calcifications are seen most prominent in the left coronary artery. The central pulmonary arteries are normal. Mediastinum/Nodes: No adenopathy. There is a small hiatal hernia. The esophagus is otherwise normal. The thyroid is unremarkable. No effusions. Lungs/Pleura: Central airways are  normal. No pneumothorax. Patchy opacities are identified most prominent the bases, left greater than right. There is also mild involvement of the right upper lobe on image 61. No masses. Upper Abdomen: No acute abnormality. Musculoskeletal: No chest wall abnormality. No acute or significant osseous findings. IMPRESSION: 1. Patchy bilateral pulmonary infiltrates most prominent in the bases but also involving the right upper lobe most likely represent an infectious process/multifocal pneumonia. Cryptogenic organizing pneumonia could have a similar appearance. Recommend follow-up to resolution. 2. Atherosclerotic changes in the thoracic aorta. 3. Coronary artery calcifications. 4. No other acute abnormalities. Aortic Atherosclerosis (ICD10-I70.0). Electronically Signed   By: Gerome Sam III M.D   On: 02/24/2017 21:56   Mr Brain Wo Contrast  Result Date: 01/30/2017 CLINICAL DATA:  Initial evaluation for acute seizure, history of epilepsy. EXAM: MRI HEAD WITHOUT CONTRAST TECHNIQUE: Multiplanar, multiecho pulse sequences of the brain and surrounding structures were obtained without intravenous contrast. COMPARISON:  Prior CT from 01/26/2017. FINDINGS: Brain: Study moderately degraded by motion artifact. Diffuse prominence of the CSF containing spaces compatible with generalized age-related cerebral atrophy. Patchy and confluent T2/FLAIR hyperintensity within the periventricular and deep white matter both cerebral hemispheres most consistent with chronic small vessel ischemic disease. No abnormal foci of restricted diffusion to suggest acute or subacute ischemia or changes related to  seizure. No encephalomalacia to suggest chronic infarction. Gray-white matter differentiation maintained. No evidence for acute or chronic intracranial hemorrhage. No mass lesion, midline shift or mass effect. Diffuse ventricular prominence related global parenchymal volume loss without hydrocephalus. No extra-axial fluid collection. Major dural sinuses are grossly patent. Pituitary gland suprasellar region grossly normal. Midline structures intact. Vascular: Major intracranial vascular flow voids are maintained. Skull and upper cervical spine: Craniocervical junction grossly normal. Bone marrow signal intensity within normal limits. No scalp soft tissue abnormality. Sinuses/Orbits: Globes and orbital soft tissues within normal limits. Patient status post cataract extraction bilaterally. Paranasal sinuses are clear. No mastoid effusion. Other: None. IMPRESSION: 1. No acute intracranial abnormality. 2. Moderately advanced cerebral atrophy with chronic small vessel ischemic disease. Electronically Signed   By: Rise Mu M.D.   On: 01/30/2017 04:58   Dg Esophagus  Result Date: 02/04/2017 CLINICAL DATA:  Possible esophageal stricture EXAM: ESOPHOGRAM/BARIUM SWALLOW TECHNIQUE: Single contrast examination was performed using  thin barium. FLUOROSCOPY TIME:  Fluoroscopy Time:  2 minutes 24 seconds Radiation Exposure Index (if provided by the fluoroscopic device): 13.2 mGy Number of Acquired Spot Images: 5 COMPARISON:  None. FINDINGS: Markedly limited evaluation due to patient's inability to follow directions. Only occasional swallows of contrast passed into the esophagus. No large esophageal mass. No definite esophageal narrowing or stricture. IMPRESSION: Markedly limited evaluation due to the patient's inability to follow directions. No large esophageal mass or definite esophageal narrowing or stricture. These results were called by telephone at the time of interpretation on 02/04/2017 at 12:27 pm to Dr.  Lenox Ponds, who verbally acknowledged these results. Electronically Signed   By: Charline Bills M.D.   On: 02/04/2017 12:37   Dg Chest Port 1 View  Result Date: 02/24/2017 CLINICAL DATA:  Cough.  Altered mental status. EXAM: PORTABLE CHEST 1 VIEW COMPARISON:  Chest x-rays dated 02/16/2017, 02/15/2017 and 01/26/2017 FINDINGS: Heart size and mediastinal contours are stable. Atherosclerotic changes again noted at the aortic arch. Patchy small ill-defined opacities are again seen bilaterally, mid and lower lung zones, similar to the most recent chest x-rays, new compared to the older chest x-ray of 01/26/2017. No pleural effusion or pneumothorax seen.  Osseous structures about the chest are unremarkable. IMPRESSION: 1. Persistent patchy small ill-defined opacities within both lungs, mid and lower lung zones, similar to the most recent chest x-rays, new compared to an older chest x-ray of 01/26/2017. As suggested on previous exams, findings are suspicious for multifocal pneumonia. Neoplastic lesions are considered less likely, however, chest CT would be recommended for more definitive characterization if these continued to persist despite appropriate antibiotic therapy. 2. Aortic atherosclerosis. Electronically Signed   By: Bary Richard M.D.   On: 02/24/2017 17:33   Dg Chest Port 1 View  Result Date: 02/16/2017 CLINICAL DATA:  Cough. EXAM: PORTABLE CHEST 1 VIEW COMPARISON:  02/15/2017 and prior chest radiographs. FINDINGS: Focal opacities within the mid and lower lungs bilaterally are unchanged from 02/15/2017 and may represent pneumonia. The cardiomediastinal silhouette is unremarkable. No new pulmonary opacities are present. There is no evidence of pleural effusion or pneumothorax. IMPRESSION: Focal mid and lower lung opacities which may represent multifocal pneumonia. This is unchanged from 02/15/2017. Electronically Signed   By: Harmon Pier M.D.   On: 02/16/2017 15:01   Dg Chest Portable 1  View  Result Date: 02/15/2017 CLINICAL DATA:  Shortness of breath, tachycardia, pneumonia EXAM: PORTABLE CHEST 1 VIEW COMPARISON:  02/02/2017 FINDINGS: Multifocal patchy opacities the bilateral upper and lower lobes, similar to the prior, although with mild progression in the left upper lobe. No pleural effusion or pneumothorax. The heart is normal in size IMPRESSION: Multifocal pneumonia, with mild progression in the left upper lobe. Electronically Signed   By: Charline Bills M.D.   On: 02/15/2017 11:26   Dg Chest Port 1 View  Result Date: 02/02/2017 CLINICAL DATA:  Shortness of breath EXAM: PORTABLE CHEST 1 VIEW COMPARISON:  February 01, 2017 FINDINGS: The cardiomediastinal silhouette is stable. No pneumothorax. Patchy opacity in left base is stable. Increased infiltrate in the right base. No other interval changes. IMPRESSION: Increasing infiltrate in the right base. Stable patchy opacity in the left base. Electronically Signed   By: Gerome Sam III M.D   On: 02/02/2017 07:48   Dg Chest Port 1 View  Result Date: 02/01/2017 CLINICAL DATA:  Aspiration. EXAM: PORTABLE CHEST 1 VIEW COMPARISON:  01/29/2017. FINDINGS: Heart size normal. Diffuse bilateral pulmonary interstitial prominence noted. These changes may be related interstitial edema and/or pneumonitis. No pleural effusion or pneumothorax. No acute bony abnormality. IMPRESSION: Diffuse bilateral pulmonary interstitial prominence noted. These changes may be related to interstitial edema and/or pneumonitis. Electronically Signed   By: Maisie Fus  Register   On: 02/01/2017 08:44   Dg Swallowing Func-speech Pathology  Result Date: 02/03/2017 Objective Swallowing Evaluation: Type of Study: MBS-Modified Barium Swallow Study  Patient Details Name: BRYCE CHEEVER MRN: [id number] Date of Birth: 1931-02-16 Today's Date: 02/03/2017 Time: SLP Start Time (ACUTE ONLY): 1105 -SLP Stop Time (ACUTE ONLY): 1125 SLP Time Calculation (min) (ACUTE ONLY): 20 min Past  Medical History: Past Medical History: Diagnosis Date . Back pain  . COPD (chronic obstructive pulmonary disease) (HCC)  . Depression  . Fatigue   a. in setting of Afib, improved following DCCV. [name] Hearing loss  . History of GI bleed 1976 . Hyperlipemia  . Hypertension  . Migraine  . Palpitations  . Persistent atrial fibrillation (HCC)   a. 02/2016 Echo: EF 66-65%, mild MR;  b. CHA2DS2VASc = 3-->coumadin;  c. 05/2016 TEE/DCCV: EF 45-50%, triv AI, mild to mod MR, no LA/LAA/RA/RAA thrombus, mod to sev TR-->successful DCCV. . Seizure (HCC)  . Syncope   a. related  to seizure disorder Past Surgical History: Past Surgical History: Procedure Laterality Date . APPENDECTOMY   . BACK SURGERY   . CARDIOVERSION N/A 06/06/2016  Procedure: CARDIOVERSION;  Surgeon: Yvonne Kendall, MD;  Location: [facility] ORS;  Service: Cardiovascular;  Laterality: N/A; . CATARACT EXTRACTION   . hemmoridectomy   . HERNIA REPAIR   . TEE WITHOUT CARDIOVERSION N/A 06/06/2016  Procedure: TRANSESOPHAGEAL ECHOCARDIOGRAM (TEE);  Surgeon: Yvonne Kendall, MD;  Location: [facility] ORS;  Service: Cardiovascular;  Laterality: N/A; HPI: 84 y.o. male with known seizure disorder admitted through ED in respiratory distress following seizure requiring intubation. ETT 1/19-21.  Subjective: "I'm good if I get to eat." Assessment / Plan / Recommendation CHL IP CLINICAL IMPRESSIONS 02/03/2017 Clinical Impression Patient presents with mild oropharyngeal dysphagia and suspected primary esophageal dysphagia. Oral stage characterized by impaired mastication and prolonged bolus preparation due to absence of dentition. Oral containment adequate without premature spillage. Swallow initiation at the base of tongue for solids, valleculae for liquids. Pharyngeal stage characterized by mildly decreased base of tongue retraction and pharyngeal constriction. Hyolaryngeal excursion appears adequate. Decreased amplitude/duration of cricopharyngeal opening and suspected suboptimal esophageal  pressure impedes full clearance resulting in residue in the valleculae and pyriform sinuses, solids>liquids. Chin tuck with dry swallow reduces residue. Occasional shallow penetration of residue after the swallow and with larger, consecutive sips of thin liquids. Pt clears with cued throat clear. Intermittent backflow through the cervical esophagus and to the pharynx. Recommend to consume dys 3 (mechanical soft) diet with thin liquids, medications one at a time with liquid, with mild-moderate risk for aspiration. Clear throat intermittently, dry swallow with chin tuck after solids to decrease residue. SLP reviewed results and recommendations with patient immediately following the exam. No prior esophageal examinations are found in chart, however pt tells SLP he was told to have his esophagus stretched (unclear when or by whom) but did not have this done. Recommend further esophageal w/u.  SLP Visit Diagnosis Dysphagia, pharyngeal phase (R13.13);Dysphagia, pharyngoesophageal phase (R13.14) Attention and concentration deficit following -- Frontal lobe and executive function deficit following -- Impact on safety and function Mild aspiration risk;Moderate aspiration risk   CHL IP TREATMENT RECOMMENDATION 02/03/2017 Treatment Recommendations Therapy as outlined in treatment plan below   Prognosis 02/03/2017 Prognosis for Safe Diet Advancement Good Barriers to Reach Goals -- Barriers/Prognosis Comment -- CHL IP DIET RECOMMENDATION 02/03/2017 SLP Diet Recommendations Dysphagia 3 (Mech soft) solids;Thin liquid Liquid Administration via Cup;Straw Medication Administration Whole meds with liquid Compensations Slow rate;Small sips/bites;Multiple dry swallows after each bite/sip;Clear throat intermittently;Follow solids with liquid Postural Changes --   CHL IP OTHER RECOMMENDATIONS 02/03/2017 Recommended Consults Consider esophageal assessment;Consider ENT evaluation Oral Care Recommendations Oral care BID Other Recommendations --    CHL IP FOLLOW UP RECOMMENDATIONS 02/03/2017 Follow up Recommendations Skilled Nursing facility   [facility] IP FREQUENCY AND DURATION 02/03/2017 Speech Therapy Frequency (ACUTE ONLY) min 2x/week Treatment Duration 1 week      CHL IP ORAL PHASE 02/03/2017 Oral Phase Impaired Oral - Pudding Teaspoon -- Oral - Pudding Cup -- Oral - Honey Teaspoon -- Oral - Honey Cup -- Oral - Nectar Teaspoon -- Oral - Nectar Cup -- Oral - Nectar Straw -- Oral - Thin Teaspoon -- Oral - Thin Cup -- Oral - Thin Straw -- Oral - Puree -- Oral - Mech Soft Impaired mastication Oral - Regular -- Oral - Multi-Consistency -- Oral - Pill -- Oral Phase - Comment --  CHL IP PHARYNGEAL PHASE 02/03/2017 Pharyngeal Phase Impaired Pharyngeal- Pudding Teaspoon --  Pharyngeal -- Pharyngeal- Pudding Cup -- Pharyngeal -- Pharyngeal- Honey Teaspoon -- Pharyngeal -- Pharyngeal- Honey Cup -- Pharyngeal -- Pharyngeal- Nectar Teaspoon -- Pharyngeal -- Pharyngeal- Nectar Cup -- Pharyngeal -- Pharyngeal- Nectar Straw -- Pharyngeal -- Pharyngeal- Thin Teaspoon -- Pharyngeal -- Pharyngeal- Thin Cup -- Pharyngeal -- Pharyngeal- Thin Straw Penetration/Aspiration during swallow;Delayed swallow initiation-vallecula;Reduced pharyngeal peristalsis;Reduced tongue base retraction;Pharyngeal residue - valleculae;Pharyngeal residue - pyriform;Pharyngeal residue - cp segment;Penetration/Apiration after swallow Pharyngeal Material enters airway, remains ABOVE vocal cords and not ejected out Pharyngeal- Puree Reduced tongue base retraction;Reduced pharyngeal peristalsis;Penetration/Apiration after swallow;Pharyngeal residue - valleculae;Pharyngeal residue - pyriform;Pharyngeal residue - posterior pharnyx;Pharyngeal residue - cp segment;Other (Comment) Pharyngeal Material enters airway, remains ABOVE vocal cords and not ejected out Pharyngeal- Mechanical Soft Reduced pharyngeal peristalsis;Reduced tongue base retraction;Pharyngeal residue - valleculae;Pharyngeal residue - posterior  pharnyx;Pharyngeal residue - cp segment;Pharyngeal residue - pyriform;Compensatory strategies attempted (with notebox) Pharyngeal -- Pharyngeal- Regular -- Pharyngeal -- Pharyngeal- Multi-consistency -- Pharyngeal -- Pharyngeal- Pill -- Pharyngeal -- Pharyngeal Comment --  CHL IP CERVICAL ESOPHAGEAL PHASE 02/03/2017 Cervical Esophageal Phase Impaired Pudding Teaspoon -- Pudding Cup -- Honey Teaspoon -- Honey Cup -- Nectar Teaspoon -- Nectar Cup -- Nectar Straw -- Thin Teaspoon -- Thin Cup -- Thin Straw Reduced cricopharyngeal relaxation;Prominent cricopharyngeal segment;Esophageal backflow into cervical esophagus Puree Reduced cricopharyngeal relaxation;Prominent cricopharyngeal segment;Esophageal backflow into cervical esophagus Mechanical Soft Reduced cricopharyngeal relaxation;Prominent cricopharyngeal segment Regular -- Multi-consistency -- Pill -- Cervical Esophageal Comment -- Rondel BatonMary Beth Bardin, MS, CCC-SLP Speech-Language Pathologist [phone] No flowsheet data found. Arlana LindauMary E Bardin 02/03/2017, 12:30 PM                The results of significant diagnostics from this hospitalization (including imaging, microbiology, ancillary and laboratory) are listed below for reference.     Microbiology: Recent Results (from the past 240 hour(s))  Blood Culture (routine x 2)     Status: None (Preliminary result)   Collection Time: 02/24/17  6:10 PM  Result Value Ref Range Status   Specimen Description BLOOD LEFT HAND  Final   Special Requests IN PEDIATRIC BOTTLE Blood Culture adequate volume  Final   Culture   Final    NO GROWTH 3 DAYS Performed at [facility], [address] [place], [place] [address]    Report Status PENDING  Incomplete  Blood Culture (routine x 2)     Status: Abnormal   Collection Time: 02/24/17  6:16 PM  Result Value Ref Range Status   Specimen Description BLOOD RIGHT ANTECUBITAL  Final   Special Requests IN PEDIATRIC BOTTLE Blood Culture adequate volume  Final   Culture  Setup  Time   Final    GRAM NEGATIVE RODS IN PEDIATRIC BOTTLE CRITICAL RESULT CALLED TO, READ BACK BY AND VERIFIED WITH: Virgel GessK PERKINS [facility]118 02/25/17 A BROWNING Performed at [facility], [address] [place], [place] [address]    Culture PSEUDOMONAS AERUGINOSA (A)  Final   Report Status 02/27/2017 FINAL  Final   Organism ID, Bacteria PSEUDOMONAS AERUGINOSA  Final      Susceptibility   Pseudomonas aeruginosa - MIC*    CEFTAZIDIME 4 SENSITIVE Sensitive     CIPROFLOXACIN <=0.25 SENSITIVE Sensitive     GENTAMICIN <=1 SENSITIVE Sensitive     IMIPENEM 1 SENSITIVE Sensitive     PIP/TAZO 8 SENSITIVE Sensitive     CEFEPIME 2 SENSITIVE Sensitive     * PSEUDOMONAS AERUGINOSA  Urine culture     Status: Abnormal   Collection Time: 02/24/17  7:03 PM  Result Value Ref Range Status   Specimen Description URINE, CATHETERIZED  Final   Special Requests   Final    NONE Performed at [facility], [address] [place], [place] [address]    Culture 20,000 COLONIES/mL PSEUDOMONAS AERUGINOSA (A)  Final   Report Status 02/28/2017 FINAL  Final   Organism ID, Bacteria PSEUDOMONAS AERUGINOSA (A)  Final      Susceptibility   Pseudomonas aeruginosa - MIC*    CEFTAZIDIME 4 SENSITIVE Sensitive     CIPROFLOXACIN <=0.25 SENSITIVE Sensitive     GENTAMICIN <=1 SENSITIVE Sensitive     IMIPENEM 1 SENSITIVE Sensitive     PIP/TAZO 8 SENSITIVE Sensitive     CEFEPIME 2 SENSITIVE Sensitive     * 20,000 COLONIES/mL PSEUDOMONAS AERUGINOSA     Labs: BNP (last 3 results) Recent Labs    02/15/17 1337  BNP 140.0*   Basic Metabolic Panel: Recent Labs  Lab 02/21/17 1545 02/24/17 1705 02/25/17 0308 02/26/17 0417 02/28/17 0730  NA 134* 136 139 137 136  K 4.3 4.4 4.2 4.1 3.9  CL 101 104 107 106 104  CO2 24 22 22 22  21*  GLUCOSE 112* 148* 92 125* 96  BUN 17 22* 20 21* 18  CREATININE 1.01 1.15 0.93 0.96 0.90  CALCIUM 8.0* 8.5* 8.2* 8.1* 8.2*  PHOS 3.3  --   --   --   --    Liver Function  Tests: No results for input(s): AST, ALT, ALKPHOS, BILITOT, PROT, ALBUMIN in the last 168 hours. No results for input(s): LIPASE, AMYLASE in the last 168 hours. No results for input(s): AMMONIA in the last 168 hours. CBC: Recent Labs  Lab 02/24/17 1705 02/25/17 0308 02/26/17 0417 02/28/17 0730  WBC 24.4* 23.6* 17.7* 13.4*  NEUTROABS 21.9* 20.1*  --   --   HGB 11.5* 10.9* 10.1* 10.8*  HCT 35.1* 33.5* 31.6* 32.5*  MCV 94.9 95.2 96.3 91.8  PLT 240 231 251 359   Cardiac Enzymes: No results for input(s): CKTOTAL, CKMB, CKMBINDEX, TROPONINI in the last 168 hours. BNP: Invalid input(s): POCBNP CBG: Recent Labs  Lab 02/26/17 0643 02/27/17 0745 02/28/17 0750  GLUCAP 105* 85 88   D-Dimer No results for input(s): DDIMER in the last 72 hours. Hgb A1c No results for input(s): HGBA1C in the last 72 hours. Lipid Profile No results for input(s): CHOL, HDL, LDLCALC, TRIG, CHOLHDL, LDLDIRECT in the last 72 hours. Thyroid function studies No results for input(s): TSH, T4TOTAL, T3FREE, THYROIDAB in the last 72 hours.  Invalid input(s): FREET3 Anemia work up No results for input(s): VITAMINB12, FOLATE, FERRITIN, TIBC, IRON, RETICCTPCT in the last 72 hours. Urinalysis    Component Value Date/Time   COLORURINE YELLOW 02/24/2017 2040   APPEARANCEUR CLOUDY (A) 02/24/2017 2040   LABSPEC 1.026 02/24/2017 2040   PHURINE 6.0 02/24/2017 2040   GLUCOSEU NEGATIVE 02/24/2017 2040   HGBUR MODERATE (A) 02/24/2017 2040   BILIRUBINUR NEGATIVE 02/24/2017 2040   KETONESUR NEGATIVE 02/24/2017 2040   PROTEINUR 100 (A) 02/24/2017 2040   UROBILINOGEN 0.2 05/17/2009 1257   NITRITE NEGATIVE 02/24/2017 2040   LEUKOCYTESUR LARGE (A) 02/24/2017 2040   Sepsis Labs Invalid input(s): PROCALCITONIN,  WBC,  LACTICIDVEN Microbiology Recent Results (from the past 240 hour(s))  Blood Culture (routine x 2)     Status: None (Preliminary result)   Collection Time: 02/24/17  6:10 PM  Result Value Ref Range  Status   Specimen Description BLOOD LEFT HAND  Final  Special Requests IN PEDIATRIC BOTTLE Blood Culture adequate volume  Final   Culture   Final    NO GROWTH 3 DAYS Performed at [facility], [address] [place], [place] [address]    Report Status PENDING  Incomplete  Blood Culture (routine x 2)     Status: Abnormal   Collection Time: 02/24/17  6:16 PM  Result Value Ref Range Status   Specimen Description BLOOD RIGHT ANTECUBITAL  Final   Special Requests IN PEDIATRIC BOTTLE Blood Culture adequate volume  Final   Culture  Setup Time   Final    GRAM NEGATIVE RODS IN PEDIATRIC BOTTLE CRITICAL RESULT CALLED TO, READ BACK BY AND VERIFIED WITH: Virgel Gess [facility] 2118 02/25/17 A BROWNING Performed at [facility], [address], [place], [place] [address]    Culture PSEUDOMONAS AERUGINOSA (A)  Final   Report Status 02/27/2017 FINAL  Final   Organism ID, Bacteria PSEUDOMONAS AERUGINOSA  Final      Susceptibility   Pseudomonas aeruginosa - MIC*    CEFTAZIDIME 4 SENSITIVE Sensitive     CIPROFLOXACIN <=0.25 SENSITIVE Sensitive     GENTAMICIN <=1 SENSITIVE Sensitive     IMIPENEM 1 SENSITIVE Sensitive     PIP/TAZO 8 SENSITIVE Sensitive     CEFEPIME 2 SENSITIVE Sensitive     * PSEUDOMONAS AERUGINOSA  Urine culture     Status: Abnormal   Collection Time: 02/24/17  7:03 PM  Result Value Ref Range Status   Specimen Description URINE, CATHETERIZED  Final   Special Requests   Final    NONE Performed at [facility], [address] [place], [place] [address]    Culture 20,000 COLONIES/mL PSEUDOMONAS AERUGINOSA (A)  Final   Report Status 02/28/2017 FINAL  Final   Organism ID, Bacteria PSEUDOMONAS AERUGINOSA (A)  Final      Susceptibility   Pseudomonas aeruginosa - MIC*    CEFTAZIDIME 4 SENSITIVE Sensitive     CIPROFLOXACIN <=0.25 SENSITIVE Sensitive     GENTAMICIN <=1 SENSITIVE Sensitive     IMIPENEM 1 SENSITIVE Sensitive     PIP/TAZO 8 SENSITIVE Sensitive      CEFEPIME 2 SENSITIVE Sensitive     * 20,000 COLONIES/mL PSEUDOMONAS AERUGINOSA     Time coordinating discharge: Over 30 minutes  SIGNED:   Calvert Cantor, MD  [facility] 02/28/2017, 10:54 AM Pager   If 7PM-7AM, please contact night-coverage [email or web address] Password [email or web address]

## 2017-02-28 NOTE — Progress Notes (Signed)
Physical Therapy Treatment Patient Details Name: Zachary Peterson MRN: [id number] DOB: Feb 06, 1931 Today's Date: 02/28/2017    History of Present Illness 84 y.o. male admitted on 2/8 with HCAP and Afib. After admission to the floor the pt presented with hypotension and was transferred to the ICU.  Following improvement a new PT order was received.  Pt with recent admission at [facility] with acute respiratory distress s/p seizure, intubated 01/26/17-01/28/17. Pt with significant PMH of dysphasia, syncope, seizure, a-fib s/p cardioversion, migraine, HTN, GIB, HOH, COPD, back pain s/p surgery, fatigue and depression.      PT Comments    Patient progressing very slowly towards PT goals. Continues to require assist for sitting balance, standing and assist of 2 for transfers to a chair. Pt lives with elderly wife who is not able to provide the necessary assist to help him at home. Concerned about pt going home in this state. Daughter in the room seems to agree but declines him going to a SNF. If pt does indeed go home, will need to maximize [facility] services- HHPT/OT/aide/RN etc. Will continue to follow.  Follow Up Recommendations  SNF     Equipment Recommendations  None recommended by PT    Recommendations for Other Services       Precautions / Restrictions Precautions Precautions: Fall Restrictions Weight Bearing Restrictions: No    Mobility  Bed Mobility Overal bed mobility: Needs Assistance Bed Mobility: Supine to Sit     Supine to sit: Mod assist;HOB elevated     General bed mobility comments: Assist to bring LLE to EOB, elevate trunk and scoot bottom to EOB. Increased time and cues.   Transfers Overall transfer level: Needs assistance Equipment used: Rolling walker (2 wheeled);2 person hand held assist Transfers: Sit to/from [employer] Sit to Stand: Mod assist;+2 physical assistance Stand pivot transfers: Mod assist;+2 physical assistance       General transfer  comment: Cues for anterior weight shift to stand from EOB; Assist of 2 to power up x2 from EOB; SPT bed to chair with pt able to take a few small steps with assist.  Ambulation/Gait                 Stairs            Wheelchair Mobility    Modified Rankin (Stroke Patients Only)       Balance Overall balance assessment: Needs assistance Sitting-balance support: Feet supported;No upper extremity supported Sitting balance-Leahy Scale: Poor Sitting balance - Comments: Pt with posterior lean sitting EOB requiring constant Mod A to maintain static sitting balance.  Postural control: Posterior lean Standing balance support: During functional activity;Bilateral upper extremity supported Standing balance-Leahy Scale: Poor Standing balance comment: dependent on external assist and BUE support in standing. Able to initiate hip/trunk extension with cues.                             Cognition Arousal/Alertness: Awake/alert Behavior During Therapy: WFL for tasks assessed/performed Overall Cognitive Status: Impaired/Different from baseline Area of Impairment: Attention;Memory;Following commands;Problem solving;Awareness;Safety/judgement                 Orientation Level: Disoriented to;Time Current Attention Level: Selective Memory: Decreased short-term memory Following Commands: Follows one step commands consistently Safety/Judgement: Decreased awareness of safety Awareness: Intellectual Problem Solving: Slow processing;Difficulty sequencing;Requires verbal cues;Requires tactile cues General Comments: Pt was naked in bed upon PT arrival trying to use urinal. When asked if  he needed to pee, he said no.       Exercises      General Comments        Pertinent Vitals/Pain Pain Assessment: Faces Faces Pain Scale: No hurt    Home Living                      Prior Function            PT Goals (current goals can now be found in the care plan  section) Progress towards PT goals: Progressing toward goals(slowly)    Frequency    Min 3X/week      PT Plan Current plan remains appropriate    Co-evaluation              AM-PAC PT "6 Clicks" Daily Activity  Outcome Measure  Difficulty turning over in bed (including adjusting bedclothes, sheets and blankets)?: Unable Difficulty moving from lying on back to sitting on the side of the bed? : Unable Difficulty sitting down on and standing up from a chair with arms (e.g., wheelchair, bedside commode, etc,.)?: Unable Help needed moving to and from a bed to chair (including a wheelchair)?: A Lot Help needed walking in hospital room?: Total Help needed climbing 3-5 steps with a railing? : Total 6 Click Score: 7    End of Session Equipment Utilized During Treatment: Gait belt;Oxygen Activity Tolerance: Patient tolerated treatment well Patient left: in chair;with call bell/phone within reach;with family/visitor present;with nursing/sitter in room Nurse Communication: Mobility status PT Visit Diagnosis: Muscle weakness (generalized) (M62.81);Unsteadiness on feet (R26.81);Other abnormalities of gait and mobility (R26.89);Difficulty in walking, not elsewhere classified (R26.2)     Time: [id number] PT Time Calculation (min) (ACUTE ONLY): 23 min  Charges:  $Therapeutic Activity: 23-37 mins                    G Codes:       Mylo RedShauna Tiffony Kite, PT, DPT [phone]     Blake DivineShauna A Lattie Riege 02/28/2017, 12:12 PM

## 2017-03-01 LAB — CULTURE, BLOOD (ROUTINE X 2)
CULTURE: NO GROWTH
SPECIAL REQUESTS: ADEQUATE

## 2017-03-14 ENCOUNTER — Other Ambulatory Visit: Payer: Self-pay | Admitting: Internal Medicine

## 2017-03-16 ENCOUNTER — Emergency Department (HOSPITAL_COMMUNITY): Payer: Medicare HMO

## 2017-03-16 ENCOUNTER — Emergency Department (HOSPITAL_COMMUNITY)
Admission: EM | Admit: 2017-03-16 | Discharge: 2017-03-17 | Disposition: A | Discharge status: Home / Self Care | Payer: Medicare HMO | Attending: Emergency Medicine | Admitting: Emergency Medicine

## 2017-03-16 ENCOUNTER — Other Ambulatory Visit: Payer: Self-pay

## 2017-03-16 DIAGNOSIS — J449 Chronic obstructive pulmonary disease, unspecified: Secondary | ICD-10-CM | POA: Insufficient documentation

## 2017-03-16 DIAGNOSIS — J069 Acute upper respiratory infection, unspecified: Secondary | ICD-10-CM | POA: Insufficient documentation

## 2017-03-16 DIAGNOSIS — Z7901 Long term (current) use of anticoagulants: Secondary | ICD-10-CM | POA: Insufficient documentation

## 2017-03-16 DIAGNOSIS — Z87891 Personal history of nicotine dependence: Secondary | ICD-10-CM | POA: Diagnosis not present

## 2017-03-16 DIAGNOSIS — I481 Persistent atrial fibrillation: Secondary | ICD-10-CM | POA: Diagnosis not present

## 2017-03-16 DIAGNOSIS — I5022 Chronic systolic (congestive) heart failure: Secondary | ICD-10-CM | POA: Insufficient documentation

## 2017-03-16 DIAGNOSIS — Z79899 Other long term (current) drug therapy: Secondary | ICD-10-CM | POA: Diagnosis not present

## 2017-03-16 DIAGNOSIS — I110 Hypertensive heart disease with heart failure: Secondary | ICD-10-CM | POA: Insufficient documentation

## 2017-03-16 DIAGNOSIS — R509 Fever, unspecified: Secondary | ICD-10-CM | POA: Insufficient documentation

## 2017-03-16 DIAGNOSIS — E785 Hyperlipidemia, unspecified: Secondary | ICD-10-CM | POA: Insufficient documentation

## 2017-03-16 LAB — COMPREHENSIVE METABOLIC PANEL
ALBUMIN: 2.5 g/dL — AB (ref 3.5–5.0)
ALK PHOS: 64 U/L (ref 38–126)
ALT: 20 U/L (ref 17–63)
ANION GAP: 12 (ref 5–15)
AST: 19 U/L (ref 15–41)
BUN: 12 mg/dL (ref 6–20)
CHLORIDE: 102 mmol/L (ref 101–111)
CO2: 23 mmol/L (ref 22–32)
Calcium: 9.6 mg/dL (ref 8.9–10.3)
Creatinine, Ser: 0.94 mg/dL (ref 0.61–1.24)
GFR calc non Af Amer: >60 mL/min (ref >60)
GLUCOSE: 122 mg/dL — AB (ref 65–99)
Potassium: 4 mmol/L (ref 3.5–5.1)
SODIUM: 137 mmol/L (ref 135–145)
Total Bilirubin: 0.5 mg/dL (ref 0.3–1.2)
Total Protein: 6 g/dL — ABNORMAL LOW (ref 6.5–8.1)

## 2017-03-16 LAB — CBC WITH DIFFERENTIAL/PLATELET
BASOS ABS: 0 10*3/uL (ref 0.0–0.1)
Basophils Relative: 0 %
Eosinophils Absolute: 0.2 10*3/uL (ref 0.0–0.7)
Eosinophils Relative: 1 %
HCT: 36.4 % — ABNORMAL LOW (ref 39.0–52.0)
HEMOGLOBIN: 12 g/dL — AB (ref 13.0–17.0)
LYMPHS ABS: 0.9 10*3/uL (ref 0.7–4.0)
LYMPHS PCT: 6 %
MCH: 30.7 pg (ref 26.0–34.0)
MCHC: 33 g/dL (ref 30.0–36.0)
MCV: 93.1 fL (ref 78.0–100.0)
Monocytes Absolute: 0.7 10*3/uL (ref 0.1–1.0)
Monocytes Relative: 4 %
NEUTROS ABS: 14.3 10*3/uL — AB (ref 1.7–7.7)
Neutrophils Relative %: 89 %
Platelets: 267 10*3/uL (ref 150–400)
RBC: 3.91 MIL/uL — AB (ref 4.22–5.81)
RDW: 15.1 % (ref 11.5–15.5)
WBC: 16.1 10*3/uL — AB (ref 4.0–10.5)

## 2017-03-16 LAB — BRAIN NATRIURETIC PEPTIDE: B NATRIURETIC PEPTIDE 5: 74.3 pg/mL (ref 0.0–100.0)

## 2017-03-16 LAB — URINALYSIS, ROUTINE W REFLEX MICROSCOPIC
BACTERIA UA: NONE SEEN
BILIRUBIN URINE: NEGATIVE
Glucose, UA: 50 mg/dL — AB
KETONES UR: NEGATIVE mg/dL
Nitrite: NEGATIVE
PH: 6 (ref 5.0–8.0)
PROTEIN: NEGATIVE mg/dL
SQUAMOUS EPITHELIAL / LPF: NONE SEEN
Specific Gravity, Urine: 1.019 (ref 1.005–1.030)

## 2017-03-16 LAB — TROPONIN I: Troponin I: <0.03 ng/mL (ref <0.03)

## 2017-03-16 NOTE — ED Triage Notes (Signed)
Patient's family called EMS for fever; gave pt Tylenol @ 19:30. Had 500cc/Ns via EMS and they state that they got a Tympanic of 103. Patient denies any complaints

## 2017-03-16 NOTE — ED Provider Notes (Signed)
[facility] EMERGENCY DEPARTMENT Provider Note   CSN: [id number] Arrival date & time: 03/16/17  2105     History   Chief Complaint Chief Complaint  Patient presents with  . Fever    99.7    HPI Zachary Peterson is a 84 y.o. male.  HPI Patient presents with fever.  Has had recent pneumonia.  Few days ago finished up antibiotics and steroids.  Has been out of the hospital for the last 16 days which is reportedly a long time for him.  Had fevers up to 103 at home and was nodding off.  Has had some nasal drainage.  Cough somewhat unchanged.  Has a chronic catheter.  Ambulates very little did not have the energy to do that today.  Has had good energy over the last couple days.  Past Medical History:  Diagnosis Date  . Back pain   . COPD (chronic obstructive pulmonary disease) (HCC)   . Depression   . Fatigue    a. in setting of Afib, improved following DCCV.  [name] Hearing loss   . History of GI bleed 1976  . Hyperlipemia   . Hypertension   . Migraine   . Palpitations   . Persistent atrial fibrillation (HCC)    a. 02/2016 Echo: EF 66-65%, mild MR;  b. CHA2DS2VASc = 3-->coumadin;  c. 05/2016 TEE/DCCV: EF 45-50%, triv AI, mild to mod MR, no LA/LAA/RA/RAA thrombus, mod to sev TR-->successful DCCV.  [name] Pneumonia   . Seizure (HCC)   . Syncope    a. related to seizure disorder    Patient Active Problem List   Diagnosis Date Noted  . Pseudomonal sepsis (HCC) 02/28/2017  . Palliative care encounter   . Cryptogenic organizing pneumonia (HCC) 02/25/2017  . Influenza A 02/25/2017  . Failure to thrive in adult 02/24/2017  . Abnormal chest x-ray   . Chronic systolic CHF (congestive heart failure) (HCC)   . Palliative care by specialist   . DNR (do not resuscitate)   . Weakness generalized   . Protein-calorie malnutrition, severe 02/18/2017  . HCAP (healthcare-associated pneumonia) 02/15/2017  . Acute respiratory failure with hypoxia (HCC)   . Dysphagia   . Chronic  obstructive pulmonary disease (HCC)   . PAF (paroxysmal atrial fibrillation) (HCC)   . Benign essential HTN   . Hypophosphatemia   . Leukocytosis   . Acute blood loss anemia   . Aspiration pneumonia (HCC) 01/27/2017  . Endotracheally intubated 01/27/2017  . Acute hypercapnic respiratory failure (HCC)   . At high risk for aspiration   . Persistent atrial fibrillation (HCC) 06/06/2016  . Encounter for therapeutic drug monitoring 03/14/2016  . Difficulty in walking, not elsewhere classified   . Muscle weakness (generalized)   . Diarrhea   . Seizure (HCC) 02/21/2016  . Atrial fibrillation with RVR (HCC) 02/21/2016  . Viral gastroenteritis 02/21/2016  . Hypokalemia 02/21/2016  . Demand ischemia of myocardium (HCC) 02/21/2016  . Essential hypertension 02/21/2016    Past Surgical History:  Procedure Laterality Date  . APPENDECTOMY    . BACK SURGERY    . CARDIOVERSION N/A 06/06/2016   Procedure: CARDIOVERSION;  Surgeon: Yvonne Kendall, MD;  Location: [facility] ORS;  Service: Cardiovascular;  Laterality: N/A;  . CATARACT EXTRACTION    . hemmoridectomy    . HERNIA REPAIR    . TEE WITHOUT CARDIOVERSION N/A 06/06/2016   Procedure: TRANSESOPHAGEAL ECHOCARDIOGRAM (TEE);  Surgeon: Yvonne Kendall, MD;  Location: [facility] ORS;  Service: Cardiovascular;  Laterality: N/A;  Home Medications    Prior to Admission medications   Medication Sig Start Date End Date Taking? Authorizing Provider  acetaminophen (TYLENOL) 500 MG tablet Take 1,000 mg by mouth every 6 (six) hours as needed for fever.   Yes [provider]  bisoprolol (ZEBETA) 5 MG tablet TAKE 1/2 TABLET BY MOUTH TWICE DAILY 03/14/17  Yes End, Cristal Deer, MD  budesonide-formoterol (SYMBICORT) 80-4.5 MCG/ACT inhaler Inhale 2 puffs into the lungs 2 (two) times daily. 02/28/17  Yes Calvert Cantor, MD  diazepam (VALIUM) 5 MG tablet Take 5 mg by mouth 2 (two) times daily.   Yes [provider]  ezetimibe-simvastatin (VYTORIN)  10-80 MG tablet Take 1 tablet by mouth daily.   Yes [provider]  fluticasone (FLONASE) 50 MCG/ACT nasal spray Place 2 sprays into both nostrils daily as needed for allergies.    Yes [provider]  guaiFENesin (MUCINEX) 600 MG 12 hr tablet Take 1 tablet (600 mg total) by mouth 2 (two) times daily as needed. Patient taking differently: Take 600 mg by mouth 2 (two) times daily.  02/28/17  Yes Rizwan, Ladell Heads, MD  ipratropium-albuterol (DUONEB) 0.5-2.5 (3) MG/3ML SOLN Take 3 mLs by nebulization 4 (four) times daily. 02/28/17  Yes Calvert Cantor, MD  lacosamide (VIMPAT) 200 MG TABS tablet Take 1 tablet (200 mg total) by mouth 2 (two) times daily. 02/22/17  Yes Sudini, Wardell Heath, MD  mirtazapine (REMERON) 15 MG tablet Take 15 mg by mouth at bedtime.   Yes [provider]  pantoprazole (PROTONIX) 20 MG tablet Take 20 mg by mouth daily.   Yes [provider]  pregabalin (LYRICA) 75 MG capsule Take 75 mg by mouth 2 (two) times daily.   Yes [provider]  warfarin (COUMADIN) 2.5 MG tablet Take 5 mg by mouth daily.   Yes [provider]  bisacodyl (DULCOLAX) 5 MG EC tablet Take 1 tablet (5 mg total) by mouth daily as needed for moderate constipation. Patient not taking: Reported on 03/16/2017 02/28/17   Calvert Cantor, MD  calcium carbonate (TUMS - DOSED IN MG ELEMENTAL CALCIUM) 500 MG chewable tablet Chew 1 tablet (200 mg of elemental calcium total) by mouth 3 (three) times daily with meals as needed for indigestion or heartburn. Patient not taking: Reported on 03/16/2017 02/24/16   Ramonita Lab, MD  ciprofloxacin (CIPRO) 500 MG tablet Take 1 tablet (500 mg total) by mouth every 12 (twelve) hours. 03/17/17   Benjiman Core, MD  ondansetron (ZOFRAN ODT) 8 MG disintegrating tablet Take 1 tablet (8 mg total) by mouth every 8 (eight) hours as needed for nausea or vomiting. Patient not taking: Reported on 03/16/2017 04/13/14   Jaynie Crumble, PA-C  oseltamivir  (TAMIFLU) 30 MG capsule Take 1 capsule (30 mg total) by mouth 2 (two) times daily. Patient not taking: Reported on 03/16/2017 02/28/17   Calvert Cantor, MD  predniSONE (DELTASONE) 50 MG tablet Start 50 mg daily Taper by 10 mg every 3 days until finished Patient not taking: Reported on 03/16/2017 03/01/17   Calvert Cantor, MD  senna-docusate (SENOKOT-S) 8.6-50 MG tablet Take 1 tablet by mouth at bedtime as needed for mild constipation. Patient not taking: Reported on 03/16/2017 02/28/17   Calvert Cantor, MD  tamsulosin [facility]) 0.4 MG CAPS capsule Take 2 capsules (0.8 mg total) by mouth daily after breakfast. Patient not taking: Reported on 03/16/2017 02/10/17   Penny Pia, MD  warfarin (COUMADIN) 2.5 MG tablet TAKE 1 OR 2 TABLETS BY MOUTH EVERY DAY OR AS PHYSICIAN INSTRUCTED  BY COUMADIN CLINIC Patient not taking: Reported on 02/24/2017 09/25/16   End, [name], MD    Family History Family History  Problem Relation Age of Onset  . Heart disease Mother   . Hypertension Father     Social History Social History   Tobacco Use  . Smoking status: Former Games developermoker  . Smokeless tobacco: Former Engineer, waterUser  Substance Use Topics  . Alcohol use: No  . Drug use: No     Allergies   Ativan [lorazepam]; Aspirin; Codeine; and Oxycodone   Review of Systems Review of Systems  Constitutional: Positive for fever. Negative for appetite change.  HENT: Positive for congestion.   Respiratory: Positive for cough.   Cardiovascular: Negative for chest pain.  Gastrointestinal: Negative for abdominal pain.  Genitourinary: Negative for flank pain.  Musculoskeletal: Negative for back pain.  Psychiatric/Behavioral: Negative for confusion.     Physical Exam Updated Vital Signs BP (!) 128/52   Pulse 75   Temp (!) 100.7 F (38.2 C) (Rectal)   Resp 20   Ht 5\' 7"  (1.702 m)   Wt 63.5 kg (140 lb)   SpO2 96%   BMI 21.93 kg/m   Physical Exam  Constitutional: He appears well-developed.  Eyes: Pupils are equal, round,  and reactive to light.  Cardiovascular: Normal rate.  Pulmonary/Chest: He has rales.  Rales bilateral bases.  Abdominal: There is no tenderness.  Musculoskeletal: He exhibits no edema.  Neurological: He is alert.  Skin: Skin is warm. Capillary refill takes less than 2 seconds.  Psychiatric: He has a normal mood and affect.     ED Treatments / Results  Labs (all labs ordered are listed, but only abnormal results are displayed) Labs Reviewed  COMPREHENSIVE METABOLIC PANEL - Abnormal; Notable for the following components:      Result Value   Glucose, Bld 122 (*)    Total Protein 6.0 (*)    Albumin 2.5 (*)    All other components within normal limits  CBC WITH DIFFERENTIAL/PLATELET - Abnormal; Notable for the following components:   WBC 16.1 (*)    RBC 3.91 (*)    Hemoglobin 12.0 (*)    HCT 36.4 (*)    Neutro Abs 14.3 (*)    All other components within normal limits  URINALYSIS, ROUTINE W REFLEX MICROSCOPIC - Abnormal; Notable for the following components:   Glucose, UA 50 (*)    Hgb urine dipstick MODERATE (*)    Leukocytes, UA TRACE (*)    All other components within normal limits  CULTURE, BLOOD (ROUTINE X 2)  CULTURE, BLOOD (ROUTINE X 2)  URINE CULTURE  TROPONIN I  BRAIN NATRIURETIC PEPTIDE    EKG  EKG Interpretation  Date/Time:  Saturday March 16 2017 21:11:37 EST Ventricular Rate:  88 PR Interval:    QRS Duration: 92 QT Interval:  346 QTC Calculation: 419 R Axis:   26 Text Interpretation:  Sinus rhythm Probable left atrial enlargement RSR' in V1 or V2, right VCD or RVH Minimal ST elevation, anterior leads Confirmed by Benjiman CorePickering, Terreon Ekholm [phone]([id number]) on 03/16/2017 10:52:23 PM       Radiology Dg Chest 2 View  Result Date: 03/16/2017 CLINICAL DATA:  Cough and fever EXAM: CHEST - 2 VIEW COMPARISON:  02/24/2017 FINDINGS: Cardiac shadow is within normal limits. Lungs are well aerated bilaterally. Mild atelectatic changes are noted in the left lung base stable from the  prior exam. No focal confluent infiltrate is seen. No bony abnormality noted. IMPRESSION: Stable left basilar changes. Electronically Signed  By: Alcide Clever M.D.   On: 03/16/2017 22:21    Procedures Procedures (including critical care time)  Medications Ordered in ED Medications  ciprofloxacin (CIPRO) IVPB 400 mg (not administered)     Initial Impression / Assessment and Plan / ED Course  I have reviewed the triage vital signs and the nursing notes.  Pertinent labs & imaging results that were available during my care of the patient were reviewed by me and considered in my medical decision making (see chart for details).     Patient brought in with fever.  Mild altered mental status is resolved.  Not hypoxic any longer.  Stable x-ray.  White count mildly elevated.  Patient has somewhat severe lung disease.  Has had Pseudomonas in urine and in his blood on recent admission.  He recently finished his Cipro.  Discussed with patient that he really does not want to be admitted.  There was previous talk to palliative care and hospice.  After a fairly long discussion we will send the patient home.  There is I think a somewhat increased risk of a bad outcome with this but he has had recent long stays in the hospital and really has the goal of not coming in the hospital.  New culture sent of urine and blood.  Was susceptible to Cipro previously and will restart this.  If patient has worsening of symptoms will need to return.  Discussed with patient and family members.  Admission was offered to the patient. Final Clinical Impressions(s) / ED Diagnoses   Final diagnoses:  Upper respiratory tract infection, unspecified type  Fever, unspecified fever cause    ED Discharge Orders        Ordered    ciprofloxacin (CIPRO) 500 MG tablet  Every 12 hours     03/17/17 0021       Benjiman Core, MD 03/17/17 [phone]

## 2017-03-17 MED ORDER — CIPROFLOXACIN HCL 500 MG PO TABS: 500.0000 mg | ORAL_TABLET | Freq: Two times a day (BID) | ORAL | 0 refills | Status: DC

## 2017-03-17 MED ORDER — CIPROFLOXACIN IN D5W 400 MG/200ML IV SOLN
400.0000 mg | Freq: Once | INTRAVENOUS | Status: AC
  Administered 2017-03-17: 400 mg via INTRAVENOUS
  Filled 2017-03-17: qty 200

## 2017-03-17 NOTE — ED Notes (Signed)
[facility] called for transport.  

## 2017-03-17 NOTE — Discharge Instructions (Signed)
Return for worsening respiratory status.  Return for worsening mental status.  Follow-up with your doctors as soon as possible.

## 2017-03-18 LAB — URINE CULTURE: CULTURE: NO GROWTH

## 2017-03-22 LAB — CULTURE, BLOOD (ROUTINE X 2)
CULTURE: NO GROWTH
Culture: NO GROWTH
SPECIAL REQUESTS: ADEQUATE
Special Requests: ADEQUATE

## 2017-04-11 ENCOUNTER — Emergency Department
Admission: EM | Admit: 2017-04-11 | Discharge: 2017-04-11 | Disposition: A | Discharge status: Home / Self Care | Payer: Medicare HMO | Attending: Emergency Medicine | Admitting: Emergency Medicine

## 2017-04-11 ENCOUNTER — Emergency Department: Payer: Medicare HMO

## 2017-04-11 ENCOUNTER — Other Ambulatory Visit: Payer: Self-pay

## 2017-04-11 DIAGNOSIS — I509 Heart failure, unspecified: Secondary | ICD-10-CM | POA: Insufficient documentation

## 2017-04-11 DIAGNOSIS — N390 Urinary tract infection, site not specified: Secondary | ICD-10-CM | POA: Insufficient documentation

## 2017-04-11 DIAGNOSIS — J449 Chronic obstructive pulmonary disease, unspecified: Secondary | ICD-10-CM | POA: Diagnosis not present

## 2017-04-11 DIAGNOSIS — Z87891 Personal history of nicotine dependence: Secondary | ICD-10-CM | POA: Insufficient documentation

## 2017-04-11 DIAGNOSIS — Z79899 Other long term (current) drug therapy: Secondary | ICD-10-CM | POA: Insufficient documentation

## 2017-04-11 DIAGNOSIS — I110 Hypertensive heart disease with heart failure: Secondary | ICD-10-CM | POA: Insufficient documentation

## 2017-04-11 DIAGNOSIS — I951 Orthostatic hypotension: Secondary | ICD-10-CM | POA: Insufficient documentation

## 2017-04-11 DIAGNOSIS — Z7901 Long term (current) use of anticoagulants: Secondary | ICD-10-CM | POA: Insufficient documentation

## 2017-04-11 DIAGNOSIS — R55 Syncope and collapse: Secondary | ICD-10-CM

## 2017-04-11 LAB — URINALYSIS, ROUTINE W REFLEX MICROSCOPIC
BILIRUBIN URINE: NEGATIVE
Bacteria, UA: NONE SEEN
Glucose, UA: NEGATIVE mg/dL
Ketones, ur: NEGATIVE mg/dL
NITRITE: POSITIVE — AB
PH: 7 (ref 5.0–8.0)
Protein, ur: 30 mg/dL — AB
SPECIFIC GRAVITY, URINE: 1.014 (ref 1.005–1.030)
Squamous Epithelial / LPF: NONE SEEN

## 2017-04-11 LAB — COMPREHENSIVE METABOLIC PANEL
ALBUMIN: 3.1 g/dL — AB (ref 3.5–5.0)
ALT: 10 U/L — ABNORMAL LOW (ref 17–63)
AST: 25 U/L (ref 15–41)
Alkaline Phosphatase: 60 U/L (ref 38–126)
Anion gap: 8 (ref 5–15)
BILIRUBIN TOTAL: 0.8 mg/dL (ref 0.3–1.2)
BUN: 15 mg/dL (ref 6–20)
CO2: 27 mmol/L (ref 22–32)
Calcium: 8.9 mg/dL (ref 8.9–10.3)
Chloride: 105 mmol/L (ref 101–111)
Creatinine, Ser: 1.05 mg/dL (ref 0.61–1.24)
GLUCOSE: 114 mg/dL — AB (ref 65–99)
POTASSIUM: 4.3 mmol/L (ref 3.5–5.1)
Sodium: 140 mmol/L (ref 135–145)
TOTAL PROTEIN: 7.6 g/dL (ref 6.5–8.1)

## 2017-04-11 LAB — CBC
HEMATOCRIT: 36.5 % — AB (ref 40.0–52.0)
Hemoglobin: 12 g/dL — ABNORMAL LOW (ref 13.0–18.0)
MCH: 29.4 pg (ref 26.0–34.0)
MCHC: 32.7 g/dL (ref 32.0–36.0)
MCV: 90 fL (ref 80.0–100.0)
Platelets: 323 10*3/uL (ref 150–440)
RBC: 4.06 MIL/uL — ABNORMAL LOW (ref 4.40–5.90)
RDW: 14.8 % — AB (ref 11.5–14.5)
WBC: 13.7 10*3/uL — ABNORMAL HIGH (ref 3.8–10.6)

## 2017-04-11 LAB — TROPONIN I

## 2017-04-11 MED ORDER — CEPHALEXIN 500 MG PO CAPS: 500.0000 mg | ORAL_CAPSULE | Freq: Two times a day (BID) | ORAL | 0 refills | Status: AC

## 2017-04-11 MED ORDER — SODIUM CHLORIDE 0.9 % IV BOLUS
1000.0000 mL | Freq: Once | INTRAVENOUS | Status: AC
Start: 2017-04-11 — End: 2017-04-11
  Administered 2017-04-11: 1000 mL via INTRAVENOUS

## 2017-04-11 MED ORDER — SODIUM CHLORIDE 0.9 % IV SOLN
1.0000 g | Freq: Once | INTRAVENOUS | Status: AC
  Administered 2017-04-11: 1 g via INTRAVENOUS
  Filled 2017-04-11: qty 10

## 2017-04-11 NOTE — ED Provider Notes (Signed)
[facility] Emergency Department Provider Note  Time seen: 4:53 PM  I have reviewed the triage vital signs and the nursing notes.   HISTORY  Chief Complaint Loss of Consciousness    HPI Zachary Peterson is a 84 y.o. male with a past medical history of COPD, depression, hypertension, hyperlipidemia, seizure disorder, palliative care, presents to the emergency department after a syncopal episode.  According to the daughter the patient was seen by his home health nurse today and everything checked out okay.  Since it was so nice outside they wheeled the patient outside in his wheelchair.  They attempted to get the patient up to have him ambulate with his walker outside.  Patient states he was feeling very tired, but still tried anyways.  Daughter states they stood the patient up and he walked 1 or 2 steps before he began feeling weak and buckling of the knees.  They were able to catch him and help him back into the wheelchair where he briefly lost consciousness.  They state he became very pale.  They checked his blood pressure and it was 70/40, then increase to around 100.  Patient was able to answer questions appropriately talk to them, then once again lost consciousness for approximately 10 or 15 seconds, blood pressure decreased again into the 70s, then slowly improved.  EMS state normal blood pressures in transit to the ER.  Normal blood pressures in the emergency department.  Here the patient appears well, denies any symptoms.  States he is not been feeling very well admits to eating less, drink 3 bottles of water yesterday and one bottle today.  Denies any fever, chest pain, abdominal pain.  Patient did not want to come to the emergency department the family wanted him evaluated.   Past Medical History:  Diagnosis Date  . Back pain   . COPD (chronic obstructive pulmonary disease) (HCC)   . Depression   . Fatigue    a. in setting of Afib, improved following DCCV.  [name]  Hearing loss   . History of GI bleed 1976  . Hyperlipemia   . Hypertension   . Migraine   . Palpitations   . Persistent atrial fibrillation (HCC)    a. 02/2016 Echo: EF 66-65%, mild MR;  b. CHA2DS2VASc = 3-->coumadin;  c. 05/2016 TEE/DCCV: EF 45-50%, triv AI, mild to mod MR, no LA/LAA/RA/RAA thrombus, mod to sev TR-->successful DCCV.  [name] Pneumonia   . Seizure (HCC)   . Syncope    a. related to seizure disorder    Patient Active Problem List   Diagnosis Date Noted  . Pseudomonal sepsis (HCC) 02/28/2017  . Palliative care encounter   . Cryptogenic organizing pneumonia (HCC) 02/25/2017  . Influenza A 02/25/2017  . Failure to thrive in adult 02/24/2017  . Abnormal chest x-ray   . Chronic systolic CHF (congestive heart failure) (HCC)   . Palliative care by specialist   . DNR (do not resuscitate)   . Weakness generalized   . Protein-calorie malnutrition, severe 02/18/2017  . HCAP (healthcare-associated pneumonia) 02/15/2017  . Acute respiratory failure with hypoxia (HCC)   . Dysphagia   . Chronic obstructive pulmonary disease (HCC)   . PAF (paroxysmal atrial fibrillation) (HCC)   . Benign essential HTN   . Hypophosphatemia   . Leukocytosis   . Acute blood loss anemia   . Aspiration pneumonia (HCC) 01/27/2017  . Endotracheally intubated 01/27/2017  . Acute hypercapnic respiratory failure (HCC)   . At high risk  for aspiration   . Persistent atrial fibrillation (HCC) 06/06/2016  . Encounter for therapeutic drug monitoring 03/14/2016  . Difficulty in walking, not elsewhere classified   . Muscle weakness (generalized)   . Diarrhea   . Seizure (HCC) 02/21/2016  . Atrial fibrillation with RVR (HCC) 02/21/2016  . Viral gastroenteritis 02/21/2016  . Hypokalemia 02/21/2016  . Demand ischemia of myocardium (HCC) 02/21/2016  . Essential hypertension 02/21/2016    Past Surgical History:  Procedure Laterality Date  . APPENDECTOMY    . BACK SURGERY    . CARDIOVERSION N/A 06/06/2016    Procedure: CARDIOVERSION;  Surgeon: Yvonne Kendall, MD;  Location: [facility] ORS;  Service: Cardiovascular;  Laterality: N/A;  . CATARACT EXTRACTION    . hemmoridectomy    . HERNIA REPAIR    . TEE WITHOUT CARDIOVERSION N/A 06/06/2016   Procedure: TRANSESOPHAGEAL ECHOCARDIOGRAM (TEE);  Surgeon: Yvonne Kendall, MD;  Location: [facility] ORS;  Service: Cardiovascular;  Laterality: N/A;    Prior to Admission medications   Medication Sig Start Date End Date Taking? Authorizing Provider  acetaminophen (TYLENOL) 500 MG tablet Take 1,000 mg by mouth every 6 (six) hours as needed for fever.    [provider]  bisacodyl (DULCOLAX) 5 MG EC tablet Take 1 tablet (5 mg total) by mouth daily as needed for moderate constipation. Patient not taking: Reported on 03/16/2017 02/28/17   Calvert Cantor, MD  bisoprolol (ZEBETA) 5 MG tablet TAKE 1/2 TABLET BY MOUTH TWICE DAILY 03/14/17   End, Cristal Deer, MD  budesonide-formoterol (SYMBICORT) 80-4.5 MCG/ACT inhaler Inhale 2 puffs into the lungs 2 (two) times daily. 02/28/17   Calvert Cantor, MD  calcium carbonate (TUMS - DOSED IN MG ELEMENTAL CALCIUM) 500 MG chewable tablet Chew 1 tablet (200 mg of elemental calcium total) by mouth 3 (three) times daily with meals as needed for indigestion or heartburn. Patient not taking: Reported on 03/16/2017 02/24/16   Ramonita Lab, MD  ciprofloxacin (CIPRO) 500 MG tablet Take 1 tablet (500 mg total) by mouth every 12 (twelve) hours. 03/17/17   Benjiman Core, MD  diazepam (VALIUM) 5 MG tablet Take 5 mg by mouth 2 (two) times daily.    [provider]  ezetimibe-simvastatin (VYTORIN) 10-80 MG tablet Take 1 tablet by mouth daily.    [provider]  fluticasone (FLONASE) 50 MCG/ACT nasal spray Place 2 sprays into both nostrils daily as needed for allergies.     [provider]  guaiFENesin (MUCINEX) 600 MG 12 hr tablet Take 1 tablet (600 mg total) by mouth 2 (two) times daily as needed. Patient taking  differently: Take 600 mg by mouth 2 (two) times daily.  02/28/17   Calvert Cantor, MD  ipratropium-albuterol (DUONEB) 0.5-2.5 (3) MG/3ML SOLN Take 3 mLs by nebulization 4 (four) times daily. 02/28/17   Calvert Cantor, MD  lacosamide (VIMPAT) 200 MG TABS tablet Take 1 tablet (200 mg total) by mouth 2 (two) times daily. 02/22/17   Milagros Loll, MD  mirtazapine (REMERON) 15 MG tablet Take 15 mg by mouth at bedtime.    [provider]  ondansetron (ZOFRAN ODT) 8 MG disintegrating tablet Take 1 tablet (8 mg total) by mouth every 8 (eight) hours as needed for nausea or vomiting. Patient not taking: Reported on 03/16/2017 04/13/14   Jaynie Crumble, PA-C  oseltamivir (TAMIFLU) 30 MG capsule Take 1 capsule (30 mg total) by mouth 2 (two) times daily. Patient not taking: Reported on 03/16/2017 02/28/17   Calvert Cantor, MD  pantoprazole (PROTONIX) 20 MG tablet Take  20 mg by mouth daily.    [provider]  predniSONE (DELTASONE) 50 MG tablet Start 50 mg daily Taper by 10 mg every 3 days until finished Patient not taking: Reported on 03/16/2017 03/01/17   Calvert Cantorizwan, Saima, MD  pregabalin (LYRICA) 75 MG capsule Take 75 mg by mouth 2 (two) times daily.    [provider]  senna-docusate (SENOKOT-S) 8.6-50 MG tablet Take 1 tablet by mouth at bedtime as needed for mild constipation. Patient not taking: Reported on 03/16/2017 02/28/17   Calvert Cantorizwan, Saima, MD  tamsulosin [facility](FLOMAX) 0.4 MG CAPS capsule Take 2 capsules (0.8 mg total) by mouth daily after breakfast. Patient not taking: Reported on 03/16/2017 02/10/17   Penny PiaVega, [place], MD  warfarin (COUMADIN) 2.5 MG tablet TAKE 1 OR 2 TABLETS BY MOUTH EVERY DAY OR AS PHYSICIAN INSTRUCTED BY COUMADIN CLINIC Patient not taking: Reported on 02/24/2017 09/25/16   End, [name], MD  warfarin (COUMADIN) 2.5 MG tablet Take 5 mg by mouth daily.    [provider]    Allergies  Allergen Reactions  . Ativan [Lorazepam] Anaphylaxis  . Aspirin Other (See Comments)     CAUSES ULCERS  . Codeine Nausea And Vomiting  . Oxycodone Nausea And Vomiting    Family History  Problem Relation Age of Onset  . Heart disease Mother   . Hypertension Father     Social History Social History   Tobacco Use  . Smoking status: Former Games developermoker  . Smokeless tobacco: Former Engineer, waterUser  Substance Use Topics  . Alcohol use: No  . Drug use: No    Review of Systems Constitutional: Negative for fever.  States he is "not been feeling well" over the past few days but cannot qualify this. Eyes: Negative for visual complaints ENT: Negative for recent illness/congestion Cardiovascular: Negative for chest pain. Respiratory: Negative for shortness of breath. Gastrointestinal: Negative for abdominal pain, vomiting and diarrhea. Genitourinary: Negative for urinary compaints Musculoskeletal: Negative for musculoskeletal complaints Skin: Negative for skin complaints  Neurological: Negative for headache All other ROS negative  ____________________________________________   PHYSICAL EXAM:  VITAL SIGNS: ED Triage Vitals  Enc Vitals Group     BP 04/11/17 1629 (!) 118/57     Pulse Rate 04/11/17 1629 80     Resp --      Temp 04/11/17 1629 98.3 F (36.8 C)     Temp Source 04/11/17 1629 Oral     SpO2 04/11/17 1629 99 %     Weight 04/11/17 1630 140 lb (63.5 kg)     Height 04/11/17 1630 5\' 7"  (1.702 m)     Head Circumference --      Peak Flow --      Pain Score 04/11/17 1630 0     Pain Loc --      Pain Edu? --      Excl. in GC? --    Constitutional: Alert and oriented.  Appears chronically ill, but no acute distress.  Thin/cachectic appearance. Eyes: Normal exam ENT   Head: Normocephalic and atraumatic.   Nose: No congestion/rhinnorhea.   Mouth/Throat: Mucous membranes are moist. Cardiovascular: Normal rate, regular rhythm. Respiratory: Normal respiratory effort without tachypnea nor retractions.  Mild rhonchi, largely clears after cough. Gastrointestinal:  Soft and nontender. No distention.  Chronic indwelling Foley catheter. Musculoskeletal: Nontender with normal range of motion in all extremities. No lower extremity tenderness or edema. Neurologic:  Normal speech and language. No gross focal neurologic deficits Skin:  Skin is warm, dry and intact.  Psychiatric:  Mood and affect are normal.  ____________________________________________    EKG  EKG reviewed and interpreted by myself shows normal sinus rhythm 80 bpm with a narrow QRS, normal axis, largely normal intervals no concerning ST changes.  Reassuring EKG.  ____________________________________________    RADIOLOGY  Chest x-ray shows chronic interstitial changes with chronic left lower lobe consolidation.  No acute abnormality.  ____________________________________________   INITIAL IMPRESSION / ASSESSMENT AND PLAN / ED COURSE  Pertinent labs & imaging results that were available during my care of the patient were reviewed by me and considered in my medical decision making (see chart for details).  Patient presents emergency department after what appears to be a syncopal episode.  Differential would include syncope, near syncope, orthostatic syncope, cardiogenic syncope.  We will check labs, IV hydrate obtain an EKG and continue to closely monitor.  Overall the patient appears well, has no complaints at this time.  Labs are largely within normal limits besides significant urinary tract infection on urinalysis.  This is a chronic catheter urine, we will send a culture but we will cover with antibiotics as a precaution.  We will place the patient on Keflex after receiving IV Rocephin in the emergency department.  Otherwise patient has mild leukocytosis of 13,700, largely normal chemistry, negative troponin.  Patient and family wish to be discharged home which I believe is a reasonable plan of care.  ____________________________________________   FINAL CLINICAL IMPRESSION(S) / ED  DIAGNOSES  Orthostatic syncope Urinary tract infection   Minna Antis, MD 04/11/17 2012

## 2017-04-11 NOTE — ED Triage Notes (Signed)
Pt arrives via ems from home. EMS reports pt had 2 syncopal episodes earlier today, each for 1-2 mins. Ems states 1st syncopal episode was with family second was with ems, and fire crew. EMS took BP before second syncopal episode 180/82 and after 78/40. Pt currently A&O x 4. NAD at this time. Base lung sounds diminished. Pt states no pain, denies falling, wife states pt was in wheelchair when he passed out. Foley in place on arrival.

## 2017-04-11 NOTE — ED Notes (Signed)
Patient to lobby in wheelchair with NAD noted. Patient discharged with indwelling catheter in place that he arrived with. Patient's family educated on catheter care and antibiotic therapy.

## 2017-04-14 LAB — URINE CULTURE

## 2017-04-15 NOTE — Progress Notes (Addendum)
ED Antimicrobial Stewardship Positive Culture Follow Up   Zachary Peterson is an 84 y.o. male who presented to [facility] on 04/11/2017 with a chief complaint of loss of consciousness. Patient has chronic foley catheter.   [x]  Treated with Keflex, organism resistant to prescribed antimicrobial.    New antibiotic prescription: Ciprofloxacin 500mg  PO BID x10 days  Discussed with patient's wife that patient is to stop taking Keflex and start ciprofloxacin. Called new prescription in to [facility] per wife's preference.   ED Provider: Dr. Cain SaupeKinner    Lekisha Mcghee, PharmD Pharmacy Resident  04/15/2017, 11:15 AM  Chief Complaint  Patient presents with  . Loss of Consciousness    Recent Results (from the past 720 hour(s))  Culture, blood (routine x 2)     Status: None   Collection Time: 03/17/17 12:30 AM  Result Value Ref Range Status   Specimen Description BLOOD RIGHT WRIST  Final   Special Requests   Final    BOTTLES DRAWN AEROBIC ONLY Blood Culture adequate volume   Culture   Final    NO GROWTH 5 DAYS Performed at [facility], [address] [place], [place] [address]    Report Status 03/22/2017 FINAL  Final  Culture, blood (routine x 2)     Status: None   Collection Time: 03/17/17 12:30 AM  Result Value Ref Range Status   Specimen Description BLOOD RIGHT FOREARM  Final   Special Requests   Final    BOTTLES DRAWN AEROBIC AND ANAEROBIC Blood Culture adequate volume   Culture   Final    NO GROWTH 5 DAYS Performed at [facility], [address] [place], [place] [address]    Report Status 03/22/2017 FINAL  Final  Urine culture     Status: None   Collection Time: 03/17/17 12:41 AM  Result Value Ref Range Status   Specimen Description URINE, RANDOM  Final   Special Requests NONE  Final   Culture   Final    NO GROWTH Performed at [facility], [address] [place], [place] [address]    Report Status 03/18/2017 FINAL  Final  Urine Culture      Status: Abnormal   Collection Time: 04/11/17  8:13 PM  Result Value Ref Range Status   Specimen Description   Final    URINE, RANDOM Performed at [facility], [address] [place], [place] [address]    Special Requests   Final    NONE Performed at [facility], [address] [place], [place] [address]    Culture >=100,000 COLONIES/mL PSEUDOMONAS AERUGINOSA (A)  Final   Report Status 04/14/2017 FINAL  Final   Organism ID, Bacteria PSEUDOMONAS AERUGINOSA (A)  Final      Susceptibility   Pseudomonas aeruginosa - MIC*    CEFTAZIDIME 4 SENSITIVE Sensitive     CIPROFLOXACIN <=0.25 SENSITIVE Sensitive     GENTAMICIN <=1 SENSITIVE Sensitive     IMIPENEM 2 SENSITIVE Sensitive     PIP/TAZO 8 SENSITIVE Sensitive     CEFEPIME 2 SENSITIVE Sensitive     * >=100,000 COLONIES/mL PSEUDOMONAS AERUGINOSA

## 2017-04-27 NOTE — Telephone Encounter (Signed)
Error

## 2017-05-22 ENCOUNTER — Telehealth: Payer: Self-pay | Admitting: Internal Medicine

## 2017-05-22 NOTE — Telephone Encounter (Signed)
I called and spoke with the patient's daughter, Zachary Peterson.  She states that the patient has been hospitalized for 6 weeks out of this year.  He is currently at home and getting PT.  A home health nurse is also seeing him twice a week. The patient has been complaining of more fatigue over the last few weeks, but he is mainly from the bed to the chair.  He will sit in the chair most of the day.  Zachary Peterson calls today concerned with his fatigue but he has also had swelling to his feet/ ankles for the last 3 days.  This is better 1st thing in the morning and with elevation of his extremities.  He is currently on home O2 at 2 L with sats around 97%. Per Zachary Peterson's report, the patient's BP/ HR are in the normal ranges (she does not have exact #'s), but his HR's have been in the 80's- he does have a history of a-fib, she is unsure if the home health nurse palpates his pulse or not as she is not usually there. He does not weigh at home.  Per Zachary Peterson, the patient lost a lot of weight in the hospital, but is now gaining it back as his appetite is up. Inquired as to his diet- per Zachary Peterson, the patient does eat some processed foods, but mostly low sodium diet.  I have advised Zachary Peterson to elevate the patient's extremities as much as possible and use compression socks if possible.  She states the patient does have some of these already and she will encourage him to use these.  He has follow up scheduled with Dr. Okey Dupre on 06/05/17 and will keep this appointment. I have advised her to call back if the patient becomes more SOB or she notices an significant increase in his swelling.  Zachary Peterson is agreeable and voices understanding.

## 2017-05-22 NOTE — Telephone Encounter (Signed)
Pt c/o swelling: STAT is pt has developed SOB within 24 hours  1) How much weight have you gained and in what time span? Does not have a record  2) If swelling, where is the swelling located? In both feet  3) Are you currently taking a fluid pill? Not on any  4) Are you currently SOB? No   5) Do you have a log of your daily weights (if so, list)? Does not have a record  6) Have you gained 3 pounds in a day or 5 pounds in a week? Not sure   7) Have you traveled recently? No  Pt has been dong Physical Therapy  and not sure if this is causing it as well. But they have been going for that now 6 weeks  States the swelling has been going on for a few days  She states he's also been complaining of being more tired than usual  Would like advise on this

## 2017-06-03 ENCOUNTER — Emergency Department
Admission: EM | Admit: 2017-06-03 | Discharge: 2017-06-04 | Disposition: A | Discharge status: Home / Self Care | Payer: Medicare HMO | Attending: Emergency Medicine | Admitting: Emergency Medicine

## 2017-06-03 ENCOUNTER — Other Ambulatory Visit: Payer: Self-pay

## 2017-06-03 ENCOUNTER — Emergency Department: Payer: Medicare HMO

## 2017-06-03 DIAGNOSIS — I4891 Unspecified atrial fibrillation: Secondary | ICD-10-CM | POA: Diagnosis not present

## 2017-06-03 DIAGNOSIS — Z7901 Long term (current) use of anticoagulants: Secondary | ICD-10-CM | POA: Insufficient documentation

## 2017-06-03 DIAGNOSIS — I5022 Chronic systolic (congestive) heart failure: Secondary | ICD-10-CM | POA: Insufficient documentation

## 2017-06-03 DIAGNOSIS — R531 Weakness: Secondary | ICD-10-CM | POA: Diagnosis present

## 2017-06-03 DIAGNOSIS — I110 Hypertensive heart disease with heart failure: Secondary | ICD-10-CM | POA: Diagnosis not present

## 2017-06-03 DIAGNOSIS — J449 Chronic obstructive pulmonary disease, unspecified: Secondary | ICD-10-CM | POA: Diagnosis not present

## 2017-06-03 DIAGNOSIS — N390 Urinary tract infection, site not specified: Secondary | ICD-10-CM | POA: Insufficient documentation

## 2017-06-03 DIAGNOSIS — Z79899 Other long term (current) drug therapy: Secondary | ICD-10-CM | POA: Insufficient documentation

## 2017-06-03 LAB — CBC WITH DIFFERENTIAL/PLATELET
Basophils Absolute: 0 10*3/uL (ref 0–0.1)
Basophils Relative: 0 %
EOS ABS: 0.6 10*3/uL (ref 0–0.7)
Eosinophils Relative: 10 %
HEMATOCRIT: 37.6 % — AB (ref 40.0–52.0)
Hemoglobin: 12.6 g/dL — ABNORMAL LOW (ref 13.0–18.0)
Lymphocytes Relative: 31 %
Lymphs Abs: 2.1 10*3/uL (ref 1.0–3.6)
MCH: 28.6 pg (ref 26.0–34.0)
MCHC: 33.5 g/dL (ref 32.0–36.0)
MCV: 85.3 fL (ref 80.0–100.0)
MONO ABS: 0.7 10*3/uL (ref 0.2–1.0)
MONOS PCT: 11 %
Neutro Abs: 3.1 10*3/uL (ref 1.4–6.5)
Neutrophils Relative %: 48 %
Platelets: 387 10*3/uL (ref 150–440)
RBC: 4.41 MIL/uL (ref 4.40–5.90)
RDW: 15.5 % — ABNORMAL HIGH (ref 11.5–14.5)
WBC: 6.5 10*3/uL (ref 3.8–10.6)

## 2017-06-03 LAB — COMPREHENSIVE METABOLIC PANEL
ALK PHOS: 73 U/L (ref 38–126)
ALT: 15 U/L — ABNORMAL LOW (ref 17–63)
ANION GAP: 5 (ref 5–15)
AST: 27 U/L (ref 15–41)
Albumin: 3.5 g/dL (ref 3.5–5.0)
BILIRUBIN TOTAL: 0.4 mg/dL (ref 0.3–1.2)
BUN: 10 mg/dL (ref 6–20)
CALCIUM: 8.8 mg/dL — AB (ref 8.9–10.3)
CO2: 30 mmol/L (ref 22–32)
CREATININE: 1.1 mg/dL (ref 0.61–1.24)
Chloride: 106 mmol/L (ref 101–111)
Glucose, Bld: 89 mg/dL (ref 65–99)
Potassium: 3.5 mmol/L (ref 3.5–5.1)
SODIUM: 141 mmol/L (ref 135–145)
TOTAL PROTEIN: 7.6 g/dL (ref 6.5–8.1)

## 2017-06-03 LAB — URINALYSIS, ROUTINE W REFLEX MICROSCOPIC
Bacteria, UA: NONE SEEN
Bilirubin Urine: NEGATIVE
GLUCOSE, UA: NEGATIVE mg/dL
Hgb urine dipstick: NEGATIVE
KETONES UR: NEGATIVE mg/dL
NITRITE: NEGATIVE
PROTEIN: NEGATIVE mg/dL
Specific Gravity, Urine: 1.008 (ref 1.005–1.030)
Squamous Epithelial / LPF: NONE SEEN (ref 0–5)
WBC, UA: >50 WBC/hpf — ABNORMAL HIGH (ref 0–5)
pH: 7 (ref 5.0–8.0)

## 2017-06-03 LAB — TROPONIN I: Troponin I: <0.03 ng/mL (ref <0.03)

## 2017-06-03 MED ORDER — SODIUM CHLORIDE 0.9 % IV BOLUS
1000.0000 mL | Freq: Once | INTRAVENOUS | Status: AC
  Administered 2017-06-03: 1000 mL via INTRAVENOUS

## 2017-06-03 MED ORDER — CEPHALEXIN 500 MG PO CAPS: 500.0000 mg | ORAL_CAPSULE | Freq: Two times a day (BID) | ORAL | 0 refills | Status: AC

## 2017-06-03 NOTE — ED Provider Notes (Signed)
[facility] Emergency Department Provider Note  Time seen: 9:17 PM  I have reviewed the triage vital signs and the nursing notes.   HISTORY  Chief Complaint Weakness    HPI Zachary Peterson is a 84 y.o. male with a past medical history of COPD, hypertension, hyperlipidemia, atrial fibrillation, presents to the emergency department for generalized fatigue and weakness.  According to the wife patient had a fall this morning, which is somewhat common for the patient.  She states at baseline he uses a walker to ambulate with assistance from the wife.  This evening patient was sleeping in the recliner and the wife could not wake the patient up she became concerned so she called EMS.  Patient was awake upon EMS arrival.  Patient states he feels much better.  Patient has a largely negative review of systems.  Wife states the patient is currently on amoxicillin for suspected urinary tract infection.   Past Medical History:  Diagnosis Date  . Back pain   . COPD (chronic obstructive pulmonary disease) (HCC)   . Depression   . Fatigue    a. in setting of Afib, improved following DCCV.  [name] Hearing loss   . History of GI bleed 1976  . Hyperlipemia   . Hypertension   . Migraine   . Palpitations   . Persistent atrial fibrillation (HCC)    a. 02/2016 Echo: EF 66-65%, mild MR;  b. CHA2DS2VASc = 3-->coumadin;  c. 05/2016 TEE/DCCV: EF 45-50%, triv AI, mild to mod MR, no LA/LAA/RA/RAA thrombus, mod to sev TR-->successful DCCV.  [name] Pneumonia   . Seizure (HCC)   . Syncope    a. related to seizure disorder    Patient Active Problem List   Diagnosis Date Noted  . Pseudomonal sepsis (HCC) 02/28/2017  . Palliative care encounter   . Cryptogenic organizing pneumonia (HCC) 02/25/2017  . Influenza A 02/25/2017  . Failure to thrive in adult 02/24/2017  . Abnormal chest x-ray   . Chronic systolic CHF (congestive heart failure) (HCC)   . Palliative care by specialist   . DNR (do not  resuscitate)   . Weakness generalized   . Protein-calorie malnutrition, severe 02/18/2017  . HCAP (healthcare-associated pneumonia) 02/15/2017  . Acute respiratory failure with hypoxia (HCC)   . Dysphagia   . Chronic obstructive pulmonary disease (HCC)   . PAF (paroxysmal atrial fibrillation) (HCC)   . Benign essential HTN   . Hypophosphatemia   . Leukocytosis   . Acute blood loss anemia   . Aspiration pneumonia (HCC) 01/27/2017  . Endotracheally intubated 01/27/2017  . Acute hypercapnic respiratory failure (HCC)   . At high risk for aspiration   . Persistent atrial fibrillation (HCC) 06/06/2016  . Encounter for therapeutic drug monitoring 03/14/2016  . Difficulty in walking, not elsewhere classified   . Muscle weakness (generalized)   . Diarrhea   . Seizure (HCC) 02/21/2016  . Atrial fibrillation with RVR (HCC) 02/21/2016  . Viral gastroenteritis 02/21/2016  . Hypokalemia 02/21/2016  . Demand ischemia of myocardium (HCC) 02/21/2016  . Essential hypertension 02/21/2016    Past Surgical History:  Procedure Laterality Date  . APPENDECTOMY    . BACK SURGERY    . CARDIOVERSION N/A 06/06/2016   Procedure: CARDIOVERSION;  Surgeon: Yvonne Kendall, MD;  Location: [facility] ORS;  Service: Cardiovascular;  Laterality: N/A;  . CATARACT EXTRACTION    . hemmoridectomy    . HERNIA REPAIR    . TEE WITHOUT CARDIOVERSION N/A 06/06/2016   Procedure:  TRANSESOPHAGEAL ECHOCARDIOGRAM (TEE);  Surgeon: Yvonne Kendall, MD;  Location: [facility] ORS;  Service: Cardiovascular;  Laterality: N/A;    Prior to Admission medications   Medication Sig Start Date End Date Taking? Authorizing Provider  acetaminophen (TYLENOL) 500 MG tablet Take 1,000 mg by mouth every 6 (six) hours as needed for fever.    [provider]  bisacodyl (DULCOLAX) 5 MG EC tablet Take 1 tablet (5 mg total) by mouth daily as needed for moderate constipation. Patient not taking: Reported on 03/16/2017 02/28/17   Calvert Cantor, MD   bisoprolol (ZEBETA) 5 MG tablet TAKE 1/2 TABLET BY MOUTH TWICE DAILY 03/14/17   End, Cristal Deer, MD  budesonide-formoterol (SYMBICORT) 80-4.5 MCG/ACT inhaler Inhale 2 puffs into the lungs 2 (two) times daily. 02/28/17   Calvert Cantor, MD  calcium carbonate (TUMS - DOSED IN MG ELEMENTAL CALCIUM) 500 MG chewable tablet Chew 1 tablet (200 mg of elemental calcium total) by mouth 3 (three) times daily with meals as needed for indigestion or heartburn. Patient not taking: Reported on 03/16/2017 02/24/16   Ramonita Lab, MD  ciprofloxacin (CIPRO) 500 MG tablet Take 1 tablet (500 mg total) by mouth every 12 (twelve) hours. 03/17/17   Benjiman Core, MD  diazepam (VALIUM) 5 MG tablet Take 5 mg by mouth 2 (two) times daily.    [provider]  ezetimibe-simvastatin (VYTORIN) 10-80 MG tablet Take 1 tablet by mouth daily.    [provider]  fluticasone (FLONASE) 50 MCG/ACT nasal spray Place 2 sprays into both nostrils daily as needed for allergies.     [provider]  guaiFENesin (MUCINEX) 600 MG 12 hr tablet Take 1 tablet (600 mg total) by mouth 2 (two) times daily as needed. Patient taking differently: Take 600 mg by mouth 2 (two) times daily.  02/28/17   Calvert Cantor, MD  ipratropium-albuterol (DUONEB) 0.5-2.5 (3) MG/3ML SOLN Take 3 mLs by nebulization 4 (four) times daily. 02/28/17   Calvert Cantor, MD  lacosamide (VIMPAT) 200 MG TABS tablet Take 1 tablet (200 mg total) by mouth 2 (two) times daily. 02/22/17   Milagros Loll, MD  mirtazapine (REMERON) 15 MG tablet Take 15 mg by mouth at bedtime.    [provider]  ondansetron (ZOFRAN ODT) 8 MG disintegrating tablet Take 1 tablet (8 mg total) by mouth every 8 (eight) hours as needed for nausea or vomiting. Patient not taking: Reported on 03/16/2017 04/13/14   Jaynie Crumble, PA-C  oseltamivir (TAMIFLU) 30 MG capsule Take 1 capsule (30 mg total) by mouth 2 (two) times daily. Patient not taking: Reported on 03/16/2017 02/28/17    Calvert Cantor, MD  pantoprazole (PROTONIX) 20 MG tablet Take 20 mg by mouth daily.    [provider]  predniSONE (DELTASONE) 50 MG tablet Start 50 mg daily Taper by 10 mg every 3 days until finished Patient not taking: Reported on 03/16/2017 03/01/17   Calvert Cantor, MD  pregabalin (LYRICA) 75 MG capsule Take 75 mg by mouth 2 (two) times daily.    [provider]  senna-docusate (SENOKOT-S) 8.6-50 MG tablet Take 1 tablet by mouth at bedtime as needed for mild constipation. Patient not taking: Reported on 03/16/2017 02/28/17   Calvert Cantor, MD  tamsulosin [facility]) 0.4 MG CAPS capsule Take 2 capsules (0.8 mg total) by mouth daily after breakfast. Patient not taking: Reported on 03/16/2017 02/10/17   Penny Pia, MD  warfarin (COUMADIN) 2.5 MG tablet TAKE 1 OR 2 TABLETS BY MOUTH EVERY DAY OR AS PHYSICIAN INSTRUCTED BY COUMADIN  CLINIC Patient not taking: Reported on 02/24/2017 09/25/16   End, Cristal Deer, MD  warfarin (COUMADIN) 2.5 MG tablet Take 5 mg by mouth daily.    [provider]    Allergies  Allergen Reactions  . Ativan [Lorazepam] Anaphylaxis  . Aspirin Other (See Comments)    CAUSES ULCERS  . Codeine Nausea And Vomiting  . Oxycodone Nausea And Vomiting    Family History  Problem Relation Age of Onset  . Heart disease Mother   . Hypertension Father     Social History Social History   Tobacco Use  . Smoking status: Former Games developer  . Smokeless tobacco: Former Engineer, water Use Topics  . Alcohol use: No  . Drug use: No    Review of Systems Constitutional: Negative for fever. Eyes: Negative for visual complaints ENT: Negative for recent illness Cardiovascular: Negative for chest pain. Respiratory: Negative for shortness of breath.  Needed for cough. Gastrointestinal: Negative for abdominal pain, vomiting and diarrhea. Genitourinary: Negative for dysuria. Musculoskeletal: Negative for musculoskeletal complaints Skin: Negative for skin complaints   Neurological: Negative for headache All other ROS negative  ____________________________________________   PHYSICAL EXAM:  VITAL SIGNS: ED Triage Vitals  Enc Vitals Group     BP 06/03/17 2106 (!) 176/67     Pulse Rate 06/03/17 2106 72     Resp 06/03/17 2106 20     Temp 06/03/17 2106 97.6 F (36.4 C)     Temp Source 06/03/17 2106 Oral     SpO2 06/03/17 2106 99 %     Weight 06/03/17 2107 140 lb (63.5 kg)     Height 06/03/17 2107  (1.702 m)     Head Circumference --      Peak Flow --      Pain Score 06/03/17 2107 0     Pain Loc --      Pain Edu? --      Excl. in GC? --    Constitutional: Alert and oriented. Well appearing and in no distress. Eyes: Normal exam ENT   Head: Normocephalic and atraumatic.   Mouth/Throat: Mucous membranes are moist. Cardiovascular: Normal rate, regular rhythm.  Respiratory: Normal respiratory effort without tachypnea nor retractions. Breath sounds are clear  Gastrointestinal: Soft and nontender. No distention.  Musculoskeletal: Nontender with normal range of motion in all extremities. No lower extremity tenderness or edema. Neurologic:  Normal speech and language. No gross focal neurologic deficits  Skin:  Skin is warm, dry and intact.  Psychiatric: Mood and affect are normal.   ____________________________________________    EKG  EKG reviewed and interpreted by myself shows normal sinus rhythm at 68 bpm with a narrow QRS, normal axis, largely normal intervals besides slight PR prolongation, nonspecific ST changes without ST elevation.  ____________________________________________    RADIOLOGY  CT scan is negative. Chest x-ray negative  ____________________________________________   INITIAL IMPRESSION / ASSESSMENT AND PLAN / ED COURSE  Pertinent labs & imaging results that were available during my care of the patient were reviewed by me and considered in my medical decision making (see chart for details).  Presents  to the emergency department for generalized weakness and a brief episode of unresponsiveness per wife.  Wife states the patient has been very weak ever since January when he was admitted for aspiration pneumonia.  Has home health care nursing to help her several times a week.  Also has home physical therapy to help with his generalized weakness and difficulty ambulating.  She states today the  patient appeared to be more weak than normal and had a brief episode where he was not responding to her while sleeping in his recliner.  Patient currently is awake alert, has no complaints at this time and overall appears well.  We will check labs, urinalysis, chest x-ray and CT scan of the head and continue to closely monitor the patient in the emergency department.  We will also IV hydrate while awaiting results.  Patient's work-up is largely negative aside urinalysis showing moderate amount of leukocytes.  Patient performs self catheterizations at home.  Likely urinary tract infection.  We will dose Rocephin.  Patient has been awake alert oriented, no complaints here.  Family states the patient appears very normal to them currently.  Patient wants to go home.  We will dose IV Rocephin in the emergency department.  Perform a catheterization to empty the patient's bladder as he is feeling some bladder distention currently.  Patient will follow up with his primary care doctor.  ____________________________________________   FINAL CLINICAL IMPRESSION(S) / ED DIAGNOSES  Generalized weakness Urinary tract infection    Minna Antis, MD 06/03/17 2320

## 2017-06-03 NOTE — ED Notes (Signed)
Pt has urge to urinate, bladder scanned over 200 in bladder. Notified EDP.

## 2017-06-03 NOTE — ED Triage Notes (Signed)
To ER via [facility] from home c/o generalized weakness today. Pt denies complaints at this time. EMS started PIV and started patient on NS due to being "orthostatic". VSS with EMS. CBG WNL. Pt in NAD at this time. Pt alert and oriented X4, active, cooperative, pt in NAD. RR even and unlabored, color WNL.

## 2017-06-03 NOTE — ED Notes (Signed)
Assisted to bathroom to have BM. Pt also able to urinate. Ambulates well with walker.

## 2017-06-03 NOTE — ED Notes (Signed)
Patient transported to CT 

## 2017-06-04 MED ORDER — SODIUM CHLORIDE 0.9 % IV SOLN
1.0000 g | Freq: Once | INTRAVENOUS | Status: AC
  Administered 2017-06-04: 1 g via INTRAVENOUS
  Filled 2017-06-04: qty 10

## 2017-06-04 NOTE — ED Notes (Signed)
Pt alert and oriented X4, active, cooperative, pt in NAD. RR even and unlabored, color WNL.  Pt informed to return if any life threatening symptoms occur.  Discharge and followup instructions reviewed.  

## 2017-06-04 NOTE — ED Notes (Signed)
In and out cath verbal orders from Dr. Lenard Lance, 1000 ml out. Pt states much relief.

## 2017-06-05 ENCOUNTER — Ambulatory Visit (INDEPENDENT_AMBULATORY_CARE_PROVIDER_SITE_OTHER): Payer: Medicare HMO

## 2017-06-05 ENCOUNTER — Encounter: Payer: Self-pay | Admitting: Internal Medicine

## 2017-06-05 ENCOUNTER — Ambulatory Visit (INDEPENDENT_AMBULATORY_CARE_PROVIDER_SITE_OTHER): Payer: Medicare HMO | Admitting: Internal Medicine

## 2017-06-05 ENCOUNTER — Other Ambulatory Visit: Payer: Self-pay

## 2017-06-05 VITALS — BP 136/60 | HR 67 | Ht 67.0 in | Wt 136.0 lb

## 2017-06-05 DIAGNOSIS — I481 Persistent atrial fibrillation: Secondary | ICD-10-CM | POA: Diagnosis not present

## 2017-06-05 DIAGNOSIS — I10 Essential (primary) hypertension: Secondary | ICD-10-CM

## 2017-06-05 DIAGNOSIS — I4891 Unspecified atrial fibrillation: Secondary | ICD-10-CM

## 2017-06-05 DIAGNOSIS — Z5181 Encounter for therapeutic drug level monitoring: Secondary | ICD-10-CM

## 2017-06-05 DIAGNOSIS — I4819 Other persistent atrial fibrillation: Secondary | ICD-10-CM

## 2017-06-05 DIAGNOSIS — E782 Mixed hyperlipidemia: Secondary | ICD-10-CM | POA: Diagnosis not present

## 2017-06-05 LAB — URINE CULTURE: Culture: NO GROWTH

## 2017-06-05 LAB — POCT INR: INR: 2 (ref 2.0–3.0)

## 2017-06-05 NOTE — Patient Instructions (Signed)
Please follow Dr. Serita Kyle instructions for stopping coumadin and starting Eliquis.

## 2017-06-05 NOTE — Patient Instructions (Addendum)
Medication Instructions:  Your physician has recommended you make the following change in your medication:  1- STOP Coumadin for 3 days, then START Eliquis 2.5 mg by mouth two times a day.   Labwork: none  Testing/Procedures: none  Follow-Up: Your physician recommends that you schedule a follow-up appointment in: 3 MONTHS WITH DR END.   If you need a refill on your cardiac medications before your next appointment, please call your pharmacy.    Medication Samples have been provided to the patient.  Drug name: Eliquis       Strength: 2.5 mg        Qty: 3 boxes  LOT: [id number]  Exp.Date: 07/2018

## 2017-06-05 NOTE — Progress Notes (Signed)
Follow-up Outpatient Visit Date: 06/05/2017  Primary Care Provider: Lonell Face, MD [phone] [facility] [address] [facility] [place] [address]  Chief Complaint: Weakness  HPI:  Zachary Peterson is a 84 y.o. year-old male with history of persistent atrial fibrillation status post successful DCCV, hypertension, hyperlipidemia, COPD, and seizure disorder, who presents for follow-up of atrial fibrillation.  I last saw Zachary Peterson in November, which time he was doing relatively well.  Unfortunately, he has been hospitalized several times since then due to respiratory problems, including pneumonia, influenza, and possible cryptogenic organizing pneumonia.  He last presented to the [facility] ED 2 days ago with generalized fatigue and weakness after a fall.  He is currently being treated with cephalexin for UTI and notes improvement in his fatigue.  Today, Zachary Peterson is without specific complaints.  He denies chest pain, shortness of breath, palpitations, lightheadedness, and edema.  He is working with physical and occupational therapy and has noticed some improvement in his strength.  He has had 3-4 falls over the last 6 months but has not had any significant injuries.  He denies bleeding, remaining compliant with warfarin.  He recently was evaluated by a new neurologist, Dr. Clelia Croft, who transitioned Zachary Peterson from 10-20 to an alternative regimen.  Zachary Peterson daughter reports one episode of leg swelling involving just the feet a few weeks ago.  This resolved spontaneously after 3 days and has not recurred.  There were no clear precipitants, including medication changes or dietary indiscretion.  --------------------------------------------------------------------------------------------------  Cardiovascular History & Procedures: Cardiovascular Problems:  Persistent atrial fibrillation (CHADSVASC = 4)  Heart failure with preserved ejection fraction  Risk Factors:  Hypertension, male gender,  and age greater than 96  Cath/PCI:  None  CV Surgery:  None  EP Procedures and Devices:  Cardioversion (06/06/16)  Non-Invasive Evaluation(s):  TEE (06/06/16): mildly reduced LV contraction (EF 45-50%). Trivial AI. Mild to moderate MR. No left atrial appendage or cavity thrombus. No right atrial thrombus. No intra-atrial shunt. Moderate to severe TR.  TTE (02/21/16): Normal LV size and wall thickness with LVEF of 55-65%. Unable to assess diastolic function due to atrial fibrillation. Mild mitral regurgitation. Upper normal RV size with normal contraction. Mildly elevated pulmonary artery pressure.   Recent CV Pertinent Labs: Lab Results  Component Value Date   CHOL 171 01/09/2017   HDL 46 01/09/2017   LDLCALC 97 01/09/2017   TRIG 150 (H) 01/27/2017   CHOLHDL 3.7 01/09/2017   INR 2.0 06/05/2017   INR 1.91 02/28/2017   BNP 74.3 03/16/2017   K 3.5 06/03/2017   K 3.2 (L) 11/23/2013   MG 1.7 02/18/2017   BUN 10 06/03/2017   BUN 14 05/30/2016   BUN 17 11/23/2013   CREATININE 1.10 06/03/2017   CREATININE 1.16 11/23/2013    Past medical and surgical history were reviewed and updated in EPIC.  Current Meds  Medication Sig  . acetaminophen (TYLENOL) 500 MG tablet Take 1,000 mg by mouth every 6 (six) hours as needed for fever.  . bisacodyl (DULCOLAX) 5 MG EC tablet Take 1 tablet (5 mg total) by mouth daily as needed for moderate constipation.  . bisoprolol (ZEBETA) 5 MG tablet TAKE 1/2 TABLET BY MOUTH TWICE DAILY  . budesonide-formoterol (SYMBICORT) 80-4.5 MCG/ACT inhaler Inhale 2 puffs into the lungs 2 (two) times daily.  . calcium carbonate (TUMS - DOSED IN MG ELEMENTAL CALCIUM) 500 MG chewable tablet Chew 1 tablet (200 mg of elemental calcium total) by mouth 3 (three)  times daily with meals as needed for indigestion or heartburn.  . cephALEXin (KEFLEX) 500 MG capsule Take 1 capsule (500 mg total) by mouth 2 (two) times daily.  . diazepam (VALIUM) 5 MG tablet Take 5 mg by  mouth 2 (two) times daily.  . DULoxetine (CYMBALTA) 30 MG capsule Take 30 mg by mouth daily.  [name] ezetimibe-simvastatin (VYTORIN) 10-80 MG tablet Take 1 tablet by mouth daily.  . fluticasone (FLONASE) 50 MCG/ACT nasal spray Place 2 sprays into both nostrils daily as needed for allergies.   [name] ipratropium-albuterol (DUONEB) 0.5-2.5 (3) MG/3ML SOLN Take 3 mLs by nebulization 4 (four) times daily.  [name] lacosamide (VIMPAT) 200 MG TABS tablet Take 1 tablet (200 mg total) by mouth 2 (two) times daily.  . mirtazapine (REMERON) 15 MG tablet Take 15 mg by mouth at bedtime.  . ondansetron (ZOFRAN ODT) 8 MG disintegrating tablet Take 1 tablet (8 mg total) by mouth every 8 (eight) hours as needed for nausea or vomiting.  . pantoprazole (PROTONIX) 20 MG tablet Take 20 mg by mouth daily.  . pregabalin (LYRICA) 75 MG capsule Take 75 mg by mouth 2 (two) times daily.  [name] senna-docusate (SENOKOT-S) 8.6-50 MG tablet Take 1 tablet by mouth at bedtime as needed for mild constipation.  . tamsulosin (FLOMAX) 0.4 MG CAPS capsule Take 2 capsules (0.8 mg total) by mouth daily after breakfast.  . warfarin (COUMADIN) 2.5 MG tablet TAKE 1 OR 2 TABLETS BY MOUTH EVERY DAY OR AS PHYSICIAN INSTRUCTED BY COUMADIN CLINIC  . warfarin (COUMADIN) 2.5 MG tablet Take 5 mg by mouth daily.  . [DISCONTINUED] ciprofloxacin (CIPRO) 500 MG tablet Take 1 tablet (500 mg total) by mouth every 12 (twelve) hours.  . [DISCONTINUED] guaiFENesin (MUCINEX) 600 MG 12 hr tablet Take 1 tablet (600 mg total) by mouth 2 (two) times daily as needed. (Patient taking differently: Take 600 mg by mouth 2 (two) times daily. )  . [DISCONTINUED] oseltamivir (TAMIFLU) 30 MG capsule Take 1 capsule (30 mg total) by mouth 2 (two) times daily.  . [DISCONTINUED] predniSONE (DELTASONE) 50 MG tablet Start 50 mg daily Taper by 10 mg every 3 days until finished    Allergies: Ativan [lorazepam]; Aspirin; Codeine; and Oxycodone  Social History   Tobacco Use  . Smoking  status: Former Games developer  . Smokeless tobacco: Current User  Substance Use Topics  . Alcohol use: No  . Drug use: No    Family History  Problem Relation Age of Onset  . Heart disease Mother   . Hypertension Father     Review of Systems: A 12-system review of systems was performed and was negative except as noted in the HPI.  --------------------------------------------------------------------------------------------------  Physical Exam: BP 136/60 (BP Location: Right Arm, Patient Position: Sitting, Cuff Size: Normal)   Pulse 67   Ht  (1.702 m)   Wt 136 lb (61.7 kg)   BMI 21.30 kg/m   General: Frail, elderly man, seated comfortably in the exam room. HEENT: No conjunctival pallor or scleral icterus. Moist mucous membranes.  OP clear. Neck: Supple without lymphadenopathy, thyromegaly, JVD, or HJR. Lungs: Normal work of breathing. Clear to auscultation bilaterally without wheezes or crackles. Heart: Regular rate and rhythm without murmurs, rubs, or gallops. Non-displaced PMI. Abd: Bowel sounds present. Soft, NT/ND without hepatosplenomegaly Ext: No lower extremity edema.  Bilateral radial pulses are 2+.  Skin: Warm and dry without rash.  EKG: Normal sinus rhythm with rSR' in V1 and V2.  No significant change from 12/04/2016.  Lab Results  Component Value Date   WBC 6.5 06/03/2017   HGB 12.6 (L) 06/03/2017   HCT 37.6 (L) 06/03/2017   MCV 85.3 06/03/2017   PLT 387 06/03/2017    Lab Results  Component Value Date   NA 141 06/03/2017   K 3.5 06/03/2017   CL 106 06/03/2017   CO2 30 06/03/2017   BUN 10 06/03/2017   CREATININE 1.10 06/03/2017   GLUCOSE 89 06/03/2017   ALT 15 (L) 06/03/2017    Lab Results  Component Value Date   CHOL 171 01/09/2017   HDL 46 01/09/2017   LDLCALC 97 01/09/2017   TRIG 150 (H) 01/27/2017   CHOLHDL 3.7 01/09/2017    --------------------------------------------------------------------------------------------------  ASSESSMENT AND  PLAN: Persistent atrial fibrillation status post cardioversion Despite multiple acute illnesses, Zachary Peterson has managed to maintain sinus rhythm.  We will continue current dose of bisoprolol.  We discussed continued anticoagulation in the setting of falls.  Given that he has not had any significant injuries and is regaining his strength, we have agreed to continue with anticoagulation.  As Zachary Peterson is no longer on phenytoin, however, we will transition from warfarin to apixaban 2.5 mg twice daily.  I will have him hold warfarin for 3 days in bed and begin apixaban.  Hyperlipidemia Most recent LDL in January was reasonable at 97.  Continue Vytorin.  Hypertension Blood pressure upper normal.  Continue current dose of bisoprolol.  No other medication changes at this time.  Follow-up: Return to clinic in 3 months.  Yvonne Kendall, MD 06/05/2017 4:19 PM

## 2017-06-17 ENCOUNTER — Emergency Department
Admission: EM | Admit: 2017-06-17 | Discharge: 2017-06-18 | Disposition: A | Discharge status: Home / Self Care | Payer: Medicare HMO | Attending: Emergency Medicine | Admitting: Emergency Medicine

## 2017-06-17 ENCOUNTER — Other Ambulatory Visit: Payer: Self-pay

## 2017-06-17 DIAGNOSIS — I5022 Chronic systolic (congestive) heart failure: Secondary | ICD-10-CM | POA: Diagnosis not present

## 2017-06-17 DIAGNOSIS — Z79899 Other long term (current) drug therapy: Secondary | ICD-10-CM | POA: Insufficient documentation

## 2017-06-17 DIAGNOSIS — J449 Chronic obstructive pulmonary disease, unspecified: Secondary | ICD-10-CM | POA: Diagnosis not present

## 2017-06-17 DIAGNOSIS — N390 Urinary tract infection, site not specified: Secondary | ICD-10-CM | POA: Insufficient documentation

## 2017-06-17 DIAGNOSIS — I110 Hypertensive heart disease with heart failure: Secondary | ICD-10-CM | POA: Diagnosis not present

## 2017-06-17 DIAGNOSIS — Z66 Do not resuscitate: Secondary | ICD-10-CM | POA: Diagnosis not present

## 2017-06-17 DIAGNOSIS — Z7901 Long term (current) use of anticoagulants: Secondary | ICD-10-CM | POA: Insufficient documentation

## 2017-06-17 DIAGNOSIS — Z87891 Personal history of nicotine dependence: Secondary | ICD-10-CM | POA: Diagnosis not present

## 2017-06-17 DIAGNOSIS — R8290 Unspecified abnormal findings in urine: Secondary | ICD-10-CM | POA: Diagnosis present

## 2017-06-17 DIAGNOSIS — F329 Major depressive disorder, single episode, unspecified: Secondary | ICD-10-CM | POA: Insufficient documentation

## 2017-06-17 DIAGNOSIS — R8281 Pyuria: Secondary | ICD-10-CM

## 2017-06-17 MED ORDER — DOXYCYCLINE HYCLATE 100 MG PO CAPS: 100.0000 mg | ORAL_CAPSULE | Freq: Two times a day (BID) | ORAL | 0 refills | Status: AC

## 2017-06-17 NOTE — ED Triage Notes (Signed)
Pt arrived via ems due to weakness. Pt was just seen for a uti (5/27) and finished his prescribed antibiotics. Pt in no distress at current time.

## 2017-06-17 NOTE — Discharge Instructions (Signed)
It was a pleasure to take care of you today, and thank you for coming to our emergency department.  If you have any questions or concerns before leaving please ask the nurse to grab me and I'm more than happy to go through your aftercare instructions again. ° °If you were prescribed any opioid pain medication today such as Norco, Vicodin, Percocet, morphine, hydrocodone, or oxycodone please make sure you do not drive when you are taking this medication as it can alter your ability to drive safely. ° °If you have any concerns once you are home that you are not improving or are in fact getting worse before you can make it to your follow-up appointment, please do not hesitate to call 911 and come back for further evaluation. ° °Bryannah Boston, MD ° ° ° °

## 2017-06-17 NOTE — ED Provider Notes (Signed)
[facility] Emergency Department Provider Note  ____________________________________________   First MD Initiated Contact with Patient 06/17/17 2056     (approximate)  I have reviewed the triage vital signs and the nursing notes.   HISTORY  Chief Complaint Weakness   HPI Zachary Peterson is a 84 y.o. male who comes to the emergency department via EMS with cloudy urine for the past several weeks.  Apparently the patient was seen in our emergency department about 2 weeks ago was diagnosed with a urinary tract infection and given antibiotics.  His symptoms did improve somewhat however have recurred.  Patient denies fevers or chills.  He denies chest pain shortness of breath abdominal pain nausea vomiting testicular pain or penile pain or discharge.  Family became concerned today because they noted increasing cloudy urine and so they called 911.  Past Medical History:  Diagnosis Date  . Back pain   . COPD (chronic obstructive pulmonary disease) (HCC)   . Depression   . Fatigue    a. in setting of Afib, improved following DCCV.  [name]. Hearing loss   . History of GI bleed 1976  . Hyperlipemia   . Hypertension   . Migraine   . Palpitations   . Persistent atrial fibrillation (HCC)    a. 02/2016 Echo: EF 66-65%, mild MR;  b. CHA2DS2VASc = 3-->coumadin;  c. 05/2016 TEE/DCCV: EF 45-50%, triv AI, mild to mod MR, no LA/LAA/RA/RAA thrombus, mod to sev TR-->successful DCCV.  [name]. Pneumonia   . Seizure (HCC)   . Syncope    a. related to seizure disorder    Patient Active Problem List   Diagnosis Date Noted  . Mixed hyperlipidemia 06/05/2017  . Pseudomonal sepsis (HCC) 02/28/2017  . Palliative care encounter   . Cryptogenic organizing pneumonia (HCC) 02/25/2017  . Influenza A 02/25/2017  . Failure to thrive in adult 02/24/2017  . Abnormal chest x-ray   . Chronic systolic CHF (congestive heart failure) (HCC)   . Palliative care by specialist   . DNR (do not  resuscitate)   . Weakness generalized   . Protein-calorie malnutrition, severe 02/18/2017  . HCAP (healthcare-associated pneumonia) 02/15/2017  . Acute respiratory failure with hypoxia (HCC)   . Dysphagia   . Chronic obstructive pulmonary disease (HCC)   . PAF (paroxysmal atrial fibrillation) (HCC)   . Benign essential HTN   . Hypophosphatemia   . Leukocytosis   . Acute blood loss anemia   . Aspiration pneumonia (HCC) 01/27/2017  . Endotracheally intubated 01/27/2017  . Acute hypercapnic respiratory failure (HCC)   . At high risk for aspiration   . Persistent atrial fibrillation (HCC) 06/06/2016  . Difficulty in walking, not elsewhere classified   . Muscle weakness (generalized)   . Diarrhea   . Seizure (HCC) 02/21/2016  . Viral gastroenteritis 02/21/2016  . Hypokalemia 02/21/2016  . Demand ischemia of myocardium (HCC) 02/21/2016  . Essential hypertension 02/21/2016    Past Surgical History:  Procedure Laterality Date  . APPENDECTOMY    . BACK SURGERY    . CARDIOVERSION N/A 06/06/2016   Procedure: CARDIOVERSION;  Surgeon: Yvonne KendallEnd, Christopher, MD;  Location: [facility] ORS;  Service: Cardiovascular;  Laterality: N/A;  . CATARACT EXTRACTION    . hemmoridectomy    . HERNIA REPAIR    . TEE WITHOUT CARDIOVERSION N/A 06/06/2016   Procedure: TRANSESOPHAGEAL ECHOCARDIOGRAM (TEE);  Surgeon: Yvonne KendallEnd, Christopher, MD;  Location: [facility] ORS;  Service: Cardiovascular;  Laterality: N/A;    Prior to Admission medications  Medication Sig Start Date End Date Taking? Authorizing Provider  acetaminophen (TYLENOL) 500 MG tablet Take 1,000 mg by mouth every 6 (six) hours as needed for fever.    [provider]  bisacodyl (DULCOLAX) 5 MG EC tablet Take 1 tablet (5 mg total) by mouth daily as needed for moderate constipation. 02/28/17   Calvert Cantor, MD  bisoprolol (ZEBETA) 5 MG tablet TAKE 1/2 TABLET BY MOUTH TWICE DAILY 03/14/17   End, Cristal Deer, MD  budesonide-formoterol (SYMBICORT) 80-4.5 MCG/ACT  inhaler Inhale 2 puffs into the lungs 2 (two) times daily. 02/28/17   Calvert Cantor, MD  calcium carbonate (TUMS - DOSED IN MG ELEMENTAL CALCIUM) 500 MG chewable tablet Chew 1 tablet (200 mg of elemental calcium total) by mouth 3 (three) times daily with meals as needed for indigestion or heartburn. 02/24/16   Gouru, Deanna Artis, MD  cephALEXin (KEFLEX) 500 MG capsule Take 1 capsule (500 mg total) by mouth 2 (two) times daily. 06/03/17   Minna Antis, MD  diazepam (VALIUM) 5 MG tablet Take 5 mg by mouth 2 (two) times daily.    [provider]  doxycycline (VIBRAMYCIN) 100 MG capsule Take 1 capsule (100 mg total) by mouth 2 (two) times daily for 14 days. 06/17/17 07/01/17  Merrily Brittle, MD  DULoxetine (CYMBALTA) 30 MG capsule Take 30 mg by mouth daily.    [provider]  ezetimibe-simvastatin (VYTORIN) 10-80 MG tablet Take 1 tablet by mouth daily.    [provider]  fluticasone (FLONASE) 50 MCG/ACT nasal spray Place 2 sprays into both nostrils daily as needed for allergies.     [provider]  ipratropium-albuterol (DUONEB) 0.5-2.5 (3) MG/3ML SOLN Take 3 mLs by nebulization 4 (four) times daily. 02/28/17   Calvert Cantor, MD  lacosamide (VIMPAT) 200 MG TABS tablet Take 1 tablet (200 mg total) by mouth 2 (two) times daily. 02/22/17   Milagros Loll, MD  mirtazapine (REMERON) 15 MG tablet Take 15 mg by mouth at bedtime.    [provider]  ondansetron (ZOFRAN ODT) 8 MG disintegrating tablet Take 1 tablet (8 mg total) by mouth every 8 (eight) hours as needed for nausea or vomiting. 04/13/14   Kirichenko, Tatyana, PA-C  pantoprazole (PROTONIX) 20 MG tablet Take 20 mg by mouth daily.    [provider]  pregabalin (LYRICA) 75 MG capsule Take 75 mg by mouth 2 (two) times daily.    [provider]  senna-docusate (SENOKOT-S) 8.6-50 MG tablet Take 1 tablet by mouth at bedtime as needed for mild constipation. 02/28/17   Calvert Cantor, MD  tamsulosin  (FLOMAX) 0.4 MG CAPS capsule Take 2 capsules (0.8 mg total) by mouth daily after breakfast. 02/10/17   Penny Pia, MD  warfarin (COUMADIN) 2.5 MG tablet TAKE 1 OR 2 TABLETS BY MOUTH EVERY DAY OR AS PHYSICIAN INSTRUCTED BY COUMADIN CLINIC 09/25/16   End, Cristal Deer, MD  warfarin (COUMADIN) 2.5 MG tablet Take 5 mg by mouth daily.    [provider]    Allergies Ativan [lorazepam]; Aspirin; Codeine; and Oxycodone  Family History  Problem Relation Age of Onset  . Heart disease Mother   . Hypertension Father     Social History Social History   Tobacco Use  . Smoking status: Former Games developer  . Smokeless tobacco: Current User  Substance Use Topics  . Alcohol use: No  . Drug use: No    Review of Systems Constitutional: No fever/chills ENT: No sore throat. Cardiovascular: Denies chest pain. Respiratory: Denies shortness of breath.  Gastrointestinal: No abdominal pain.  No nausea, no vomiting.  No diarrhea.  No constipation. Musculoskeletal: Negative for back pain. Neurological: Negative for headaches   ____________________________________________   PHYSICAL EXAM:  VITAL SIGNS: ED Triage Vitals  Enc Vitals Group     BP      Pulse      Resp      Temp      Temp src      SpO2      Weight      Height      Head Circumference      Peak Flow      Pain Score      Pain Loc      Pain Edu?      Excl. in GC?     Constitutional: Pleasant cooperative chronically ill-appearing no distress Head: Atraumatic. Nose: No congestion/rhinnorhea. Mouth/Throat: No trismus Neck: No stridor.   Cardiovascular: Rate and rhythm Respiratory: Normal respiratory effort.  No retractions. Gastrointestinal: Soft nontender.  Normal circumcised phallus Neurologic:  Normal speech and language. No gross focal neurologic deficits are appreciated.  Skin:  Skin is warm, dry and intact. No rash noted.    ____________________________________________  LABS (all labs ordered are listed, but  only abnormal results are displayed)  Labs Reviewed  CHLAMYDIA/NGC RT PCR ([facility] ONLY)  URINE CULTURE    GC/CT and culture are pending __________________________________________  EKG   ____________________________________________  RADIOLOGY   ____________________________________________   DIFFERENTIAL includes but not limited to  Chlamydia, gonorrhea, UTI, prostatitis, sterile pyuria   PROCEDURES  Procedure(s) performed: no  Procedures  Critical Care performed: no  Observation: no ____________________________________________   INITIAL IMPRESSION / ASSESSMENT AND PLAN / ED COURSE  Pertinent labs & imaging results that were available during my care of the patient were reviewed by me and considered in my medical decision making (see chart for details).  On arrival the patient is very well-appearing and relatively asymptomatic.  On chart review his previous urinalysis shows sterile pyuria and culture was negative.  Unclear etiology of the symptoms but I have to consider chlamydia despite his advanced age.  I will add on a GC/CT now and a new urine culture and treat him with doxycycline in the meantime and refer back to primary care.  The patient family verbalized understanding agree with plan.      ____________________________________________   FINAL CLINICAL IMPRESSION(S) / ED DIAGNOSES  Final diagnoses:  Sterile pyuria      NEW MEDICATIONS STARTED DURING THIS VISIT:  Discharge Medication List as of 06/17/2017 10:08 PM    START taking these medications   Details  doxycycline (VIBRAMYCIN) 100 MG capsule Take 1 capsule (100 mg total) by mouth 2 (two) times daily for 14 days., Starting Mon 06/17/2017, Until Mon 07/01/2017, Print         Note:  This document was prepared using Dragon voice recognition software and may include unintentional dictation errors.      Merrily Brittle, MD 06/18/17 1431

## 2017-06-18 LAB — CHLAMYDIA/NGC RT PCR (ARMC ONLY)
Chlamydia Tr: NOT DETECTED
N GONORRHOEAE: NOT DETECTED

## 2017-06-20 ENCOUNTER — Encounter: Payer: Self-pay | Admitting: Emergency Medicine

## 2017-06-20 ENCOUNTER — Emergency Department
Admission: EM | Admit: 2017-06-20 | Discharge: 2017-06-20 | Disposition: A | Discharge status: Home / Self Care | Payer: Medicare HMO | Attending: Emergency Medicine | Admitting: Emergency Medicine

## 2017-06-20 ENCOUNTER — Other Ambulatory Visit: Payer: Self-pay

## 2017-06-20 ENCOUNTER — Emergency Department: Payer: Medicare HMO

## 2017-06-20 DIAGNOSIS — I10 Essential (primary) hypertension: Secondary | ICD-10-CM | POA: Insufficient documentation

## 2017-06-20 DIAGNOSIS — R339 Retention of urine, unspecified: Secondary | ICD-10-CM | POA: Insufficient documentation

## 2017-06-20 DIAGNOSIS — Z79899 Other long term (current) drug therapy: Secondary | ICD-10-CM | POA: Diagnosis not present

## 2017-06-20 DIAGNOSIS — S0993XA Unspecified injury of face, initial encounter: Secondary | ICD-10-CM | POA: Diagnosis not present

## 2017-06-20 DIAGNOSIS — Y92003 Bedroom of unspecified non-institutional (private) residence as the place of occurrence of the external cause: Secondary | ICD-10-CM | POA: Insufficient documentation

## 2017-06-20 DIAGNOSIS — I481 Persistent atrial fibrillation: Secondary | ICD-10-CM | POA: Diagnosis not present

## 2017-06-20 DIAGNOSIS — E785 Hyperlipidemia, unspecified: Secondary | ICD-10-CM | POA: Diagnosis not present

## 2017-06-20 DIAGNOSIS — R338 Other retention of urine: Secondary | ICD-10-CM

## 2017-06-20 DIAGNOSIS — Z7901 Long term (current) use of anticoagulants: Secondary | ICD-10-CM | POA: Diagnosis not present

## 2017-06-20 DIAGNOSIS — Y939 Activity, unspecified: Secondary | ICD-10-CM | POA: Insufficient documentation

## 2017-06-20 DIAGNOSIS — W06XXXA Fall from bed, initial encounter: Secondary | ICD-10-CM | POA: Insufficient documentation

## 2017-06-20 DIAGNOSIS — S0083XA Contusion of other part of head, initial encounter: Secondary | ICD-10-CM | POA: Insufficient documentation

## 2017-06-20 DIAGNOSIS — Z87891 Personal history of nicotine dependence: Secondary | ICD-10-CM | POA: Diagnosis not present

## 2017-06-20 DIAGNOSIS — S0990XA Unspecified injury of head, initial encounter: Secondary | ICD-10-CM | POA: Diagnosis not present

## 2017-06-20 DIAGNOSIS — Y998 Other external cause status: Secondary | ICD-10-CM | POA: Insufficient documentation

## 2017-06-20 DIAGNOSIS — J449 Chronic obstructive pulmonary disease, unspecified: Secondary | ICD-10-CM | POA: Diagnosis not present

## 2017-06-20 LAB — URINE CULTURE

## 2017-06-20 NOTE — ED Provider Notes (Signed)
[facility] Emergency Department Provider Note   I have reviewed the triage vital signs and the nursing notes.   HISTORY  Chief Complaint Fall and Epistaxis    HPI Zachary Peterson is a 84 y.o. male with below list of chronic medical conditions including urinary retention Modena JanskyZentz to the emergency department via EMS after accidentally rolling out of bed per EMS staff.  Patient with apparent facial injury.  Patient admits to urge to urinate at this time.  EMS states that the family straight cath the patient at home.  Patient's daughter states that the patient had an indwelling catheter was removed and is requesting an indwelling catheter be replaced at this time.  Of note the patient is currently on Eliquis.  Patient denies any loss of consciousness.  Past Medical History:  Diagnosis Date  . Back pain   . COPD (chronic obstructive pulmonary disease) (HCC)   . Depression   . Fatigue    a. in setting of Afib, improved following DCCV.  [name]. Hearing loss   . History of GI bleed 1976  . Hyperlipemia   . Hypertension   . Migraine   . Palpitations   . Persistent atrial fibrillation (HCC)    a. 02/2016 Echo: EF 66-65%, mild MR;  b. CHA2DS2VASc = 3-->coumadin;  c. 05/2016 TEE/DCCV: EF 45-50%, triv AI, mild to mod MR, no LA/LAA/RA/RAA thrombus, mod to sev TR-->successful DCCV.  [name]. Pneumonia   . Seizure (HCC)   . Syncope    a. related to seizure disorder    Patient Active Problem List   Diagnosis Date Noted  . Mixed hyperlipidemia 06/05/2017  . Pseudomonal sepsis (HCC) 02/28/2017  . Palliative care encounter   . Cryptogenic organizing pneumonia (HCC) 02/25/2017  . Influenza A 02/25/2017  . Failure to thrive in adult 02/24/2017  . Abnormal chest x-ray   . Chronic systolic CHF (congestive heart failure) (HCC)   . Palliative care by specialist   . DNR (do not resuscitate)   . Weakness generalized   . Protein-calorie malnutrition, severe 02/18/2017  . HCAP  (healthcare-associated pneumonia) 02/15/2017  . Acute respiratory failure with hypoxia (HCC)   . Dysphagia   . Chronic obstructive pulmonary disease (HCC)   . PAF (paroxysmal atrial fibrillation) (HCC)   . Benign essential HTN   . Hypophosphatemia   . Leukocytosis   . Acute blood loss anemia   . Aspiration pneumonia (HCC) 01/27/2017  . Endotracheally intubated 01/27/2017  . Acute hypercapnic respiratory failure (HCC)   . At high risk for aspiration   . Persistent atrial fibrillation (HCC) 06/06/2016  . Difficulty in walking, not elsewhere classified   . Muscle weakness (generalized)   . Diarrhea   . Seizure (HCC) 02/21/2016  . Viral gastroenteritis 02/21/2016  . Hypokalemia 02/21/2016  . Demand ischemia of myocardium (HCC) 02/21/2016  . Essential hypertension 02/21/2016    Past Surgical History:  Procedure Laterality Date  . APPENDECTOMY    . BACK SURGERY    . CARDIOVERSION N/A 06/06/2016   Procedure: CARDIOVERSION;  Surgeon: Yvonne KendallEnd, Christopher, MD;  Location: [facility] ORS;  Service: Cardiovascular;  Laterality: N/A;  . CATARACT EXTRACTION    . hemmoridectomy    . HERNIA REPAIR    . TEE WITHOUT CARDIOVERSION N/A 06/06/2016   Procedure: TRANSESOPHAGEAL ECHOCARDIOGRAM (TEE);  Surgeon: Yvonne KendallEnd, Christopher, MD;  Location: [facility] ORS;  Service: Cardiovascular;  Laterality: N/A;    Prior to Admission medications   Medication Sig Start Date End Date Taking? Authorizing Provider  acetaminophen (TYLENOL) 500 MG tablet Take 1,000 mg by mouth every 6 (six) hours as needed for fever.    [provider]  bisacodyl (DULCOLAX) 5 MG EC tablet Take 1 tablet (5 mg total) by mouth daily as needed for moderate constipation. 02/28/17   Calvert Cantor, MD  bisoprolol (ZEBETA) 5 MG tablet TAKE 1/2 TABLET BY MOUTH TWICE DAILY 03/14/17   End, Cristal Deer, MD  budesonide-formoterol (SYMBICORT) 80-4.5 MCG/ACT inhaler Inhale 2 puffs into the lungs 2 (two) times daily. 02/28/17   Calvert Cantor, MD  calcium  carbonate (TUMS - DOSED IN MG ELEMENTAL CALCIUM) 500 MG chewable tablet Chew 1 tablet (200 mg of elemental calcium total) by mouth 3 (three) times daily with meals as needed for indigestion or heartburn. 02/24/16   Gouru, Deanna Artis, MD  cephALEXin (KEFLEX) 500 MG capsule Take 1 capsule (500 mg total) by mouth 2 (two) times daily. 06/03/17   Minna Antis, MD  diazepam (VALIUM) 5 MG tablet Take 5 mg by mouth 2 (two) times daily.    [provider]  doxycycline (VIBRAMYCIN) 100 MG capsule Take 1 capsule (100 mg total) by mouth 2 (two) times daily for 14 days. 06/17/17 07/01/17  Merrily Brittle, MD  DULoxetine (CYMBALTA) 30 MG capsule Take 30 mg by mouth daily.    [provider]  ezetimibe-simvastatin (VYTORIN) 10-80 MG tablet Take 1 tablet by mouth daily.    [provider]  fluticasone (FLONASE) 50 MCG/ACT nasal spray Place 2 sprays into both nostrils daily as needed for allergies.     [provider]  ipratropium-albuterol (DUONEB) 0.5-2.5 (3) MG/3ML SOLN Take 3 mLs by nebulization 4 (four) times daily. 02/28/17   Calvert Cantor, MD  lacosamide (VIMPAT) 200 MG TABS tablet Take 1 tablet (200 mg total) by mouth 2 (two) times daily. 02/22/17   Milagros Loll, MD  mirtazapine (REMERON) 15 MG tablet Take 15 mg by mouth at bedtime.    [provider]  ondansetron (ZOFRAN ODT) 8 MG disintegrating tablet Take 1 tablet (8 mg total) by mouth every 8 (eight) hours as needed for nausea or vomiting. 04/13/14   Kirichenko, Tatyana, PA-C  pantoprazole (PROTONIX) 20 MG tablet Take 20 mg by mouth daily.    [provider]  pregabalin (LYRICA) 75 MG capsule Take 75 mg by mouth 2 (two) times daily.    [provider]  senna-docusate (SENOKOT-S) 8.6-50 MG tablet Take 1 tablet by mouth at bedtime as needed for mild constipation. 02/28/17   Calvert Cantor, MD  tamsulosin (FLOMAX) 0.4 MG CAPS capsule Take 2 capsules (0.8 mg total) by mouth daily after breakfast. 02/10/17    Penny Pia, MD  warfarin (COUMADIN) 2.5 MG tablet TAKE 1 OR 2 TABLETS BY MOUTH EVERY DAY OR AS PHYSICIAN INSTRUCTED BY COUMADIN CLINIC 09/25/16   End, Cristal Deer, MD  warfarin (COUMADIN) 2.5 MG tablet Take 5 mg by mouth daily.    [provider]    Allergies Ativan [lorazepam]; Aspirin; Codeine; and Oxycodone  Family History  Problem Relation Age of Onset  . Heart disease Mother   . Hypertension Father     Social History Social History   Tobacco Use  . Smoking status: Former Games developer  . Smokeless tobacco: Current User  Substance Use Topics  . Alcohol use: No  . Drug use: No    Review of Systems Constitutional: No fever/chills Eyes: No visual changes. ENT: No sore throat.  Positive for nasal blunt injury Cardiovascular: Denies chest pain. Respiratory: Denies shortness of breath.  Gastrointestinal: No abdominal pain.  No nausea, no vomiting.  No diarrhea.  No constipation. Genitourinary: Negative for dysuria.  Positive for urinary urgency Musculoskeletal: Negative for neck pain.  Negative for back pain. Integumentary: Negative for rash. Neurological: Negative for headaches, focal weakness or numbness.   ____________________________________________   PHYSICAL EXAM:  VITAL SIGNS: ED Triage Vitals  Enc Vitals Group     BP 06/20/17 0109 (!) 215/97     Pulse Rate 06/20/17 0109 87     Resp 06/20/17 0109 16     Temp 06/20/17 0109 (!) 97.4 F (36.3 C)     Temp Source 06/20/17 0109 Oral     SpO2 06/20/17 0109 100 %     Weight 06/20/17 0110 62.1 kg (137 lb)     Height 06/20/17 0110 1.702 m (5\' 7" )     Head Circumference --      Peak Flow --      Pain Score 06/20/17 0110 0     Pain Loc --      Pain Edu? --      Excl. in GC? --     Constitutional: Alert and oriented. Well appearing and in no acute distress. Eyes: Conjunctivae are normal. PERRL. EOMI. Head: Patient is swelling noted to the nasal bridge Mouth/Throat: Mucous membranes are moist.  Oropharynx  non-erythematous. Neck: No stridor.  No cervical spine tenderness to palpation. Cardiovascular: Normal rate, regular rhythm. Good peripheral circulation. Grossly normal heart sounds. Respiratory: Normal respiratory effort.  No retractions. Lungs CTAB. Gastrointestinal: Soft and nontender. No distention.  Musculoskeletal: No lower extremity tenderness nor edema. No gross deformities of extremities. Neurologic:  Normal speech and language. No gross focal neurologic deficits are appreciated.  Skin:  Skin is warm, dry and intact. No rash noted. Psychiatric: Mood and affect are normal. Speech and behavior are normal.    RADIOLOGY I, [place] Chandria Rookstool, personally viewed and evaluated these images (plain radiographs) as part of my medical decision making, as well as reviewing the written report by the radiologist.  ED MD interpretation: CT head maxillofacial and cervical spine revealed no fracture or dislocation.  Official radiology report(s): Ct Head Wo Contrast  Result Date: 06/20/2017 CLINICAL DATA:  Fall, head trauma.  On anticoagulation. EXAM: CT HEAD WITHOUT CONTRAST CT MAXILLOFACIAL WITHOUT CONTRAST CT CERVICAL SPINE WITHOUT CONTRAST TECHNIQUE: Multidetector CT imaging of the head, cervical spine, and maxillofacial structures were performed using the standard protocol without intravenous contrast. Multiplanar CT image reconstructions of the cervical spine and maxillofacial structures were also generated. COMPARISON:  Head CT 06/03/2017 FINDINGS: CT HEAD FINDINGS Brain: Stable degree of atrophy and chronic small vessel ischemia. No intracranial hemorrhage, mass effect, or midline shift. No hydrocephalus. The basilar cisterns are patent. No evidence of territorial infarct or acute ischemia. No extra-axial or intracranial fluid collection. Vascular: Atherosclerosis of skullbase vasculature without hyperdense vessel or abnormal calcification. Skull: No fracture or focal lesion. Other: None. CT  MAXILLOFACIAL FINDINGS Osseous: Zygomatic arches, nasal bone, and mandibles are intact. Temporomandibular joints are congruent. Patient is edentulous. Orbits: Both orbits and globes are intact. No orbital fracture. Bilateral cataract resection. Sinuses: Clear.  No sinus fracture or fluid level. Soft tissues: Negative. CT CERVICAL SPINE FINDINGS Alignment: Normal. Skull base and vertebrae: No acute fracture. Vertebral body heights are maintained. The dens and skull base are intact. Soft tissues and spinal canal: No prevertebral fluid or swelling. No visible canal hematoma. Disc levels: Multilevel endplate spurring, disc space narrowing throughout, most prominent at C6-C7. Multilevel facet arthropathy.  Upper chest: Biapical pleuroparenchymal scarring, right greater than left. Other: Carotid calcifications. IMPRESSION: 1.  No acute intracranial abnormality.  No skull fracture. 2. Stable degree of atrophy and chronic small vessel ischemia. 3. No facial bone fracture. 4. Degenerative change throughout the cervical spine without acute fracture or subluxation. Electronically Signed   By: Rubye Oaks M.D.   On: 06/20/2017 02:23   Ct Cervical Spine Wo Contrast  Result Date: 06/20/2017 CLINICAL DATA:  Fall, head trauma.  On anticoagulation. EXAM: CT HEAD WITHOUT CONTRAST CT MAXILLOFACIAL WITHOUT CONTRAST CT CERVICAL SPINE WITHOUT CONTRAST TECHNIQUE: Multidetector CT imaging of the head, cervical spine, and maxillofacial structures were performed using the standard protocol without intravenous contrast. Multiplanar CT image reconstructions of the cervical spine and maxillofacial structures were also generated. COMPARISON:  Head CT 06/03/2017 FINDINGS: CT HEAD FINDINGS Brain: Stable degree of atrophy and chronic small vessel ischemia. No intracranial hemorrhage, mass effect, or midline shift. No hydrocephalus. The basilar cisterns are patent. No evidence of territorial infarct or acute ischemia. No extra-axial or  intracranial fluid collection. Vascular: Atherosclerosis of skullbase vasculature without hyperdense vessel or abnormal calcification. Skull: No fracture or focal lesion. Other: None. CT MAXILLOFACIAL FINDINGS Osseous: Zygomatic arches, nasal bone, and mandibles are intact. Temporomandibular joints are congruent. Patient is edentulous. Orbits: Both orbits and globes are intact. No orbital fracture. Bilateral cataract resection. Sinuses: Clear.  No sinus fracture or fluid level. Soft tissues: Negative. CT CERVICAL SPINE FINDINGS Alignment: Normal. Skull base and vertebrae: No acute fracture. Vertebral body heights are maintained. The dens and skull base are intact. Soft tissues and spinal canal: No prevertebral fluid or swelling. No visible canal hematoma. Disc levels: Multilevel endplate spurring, disc space narrowing throughout, most prominent at C6-C7. Multilevel facet arthropathy. Upper chest: Biapical pleuroparenchymal scarring, right greater than left. Other: Carotid calcifications. IMPRESSION: 1.  No acute intracranial abnormality.  No skull fracture. 2. Stable degree of atrophy and chronic small vessel ischemia. 3. No facial bone fracture. 4. Degenerative change throughout the cervical spine without acute fracture or subluxation. Electronically Signed   By: Rubye Oaks M.D.   On: 06/20/2017 02:23   Ct Maxillofacial Wo Contrast  Result Date: 06/20/2017 CLINICAL DATA:  Fall, head trauma.  On anticoagulation. EXAM: CT HEAD WITHOUT CONTRAST CT MAXILLOFACIAL WITHOUT CONTRAST CT CERVICAL SPINE WITHOUT CONTRAST TECHNIQUE: Multidetector CT imaging of the head, cervical spine, and maxillofacial structures were performed using the standard protocol without intravenous contrast. Multiplanar CT image reconstructions of the cervical spine and maxillofacial structures were also generated. COMPARISON:  Head CT 06/03/2017 FINDINGS: CT HEAD FINDINGS Brain: Stable degree of atrophy and chronic small vessel ischemia.  No intracranial hemorrhage, mass effect, or midline shift. No hydrocephalus. The basilar cisterns are patent. No evidence of territorial infarct or acute ischemia. No extra-axial or intracranial fluid collection. Vascular: Atherosclerosis of skullbase vasculature without hyperdense vessel or abnormal calcification. Skull: No fracture or focal lesion. Other: None. CT MAXILLOFACIAL FINDINGS Osseous: Zygomatic arches, nasal bone, and mandibles are intact. Temporomandibular joints are congruent. Patient is edentulous. Orbits: Both orbits and globes are intact. No orbital fracture. Bilateral cataract resection. Sinuses: Clear.  No sinus fracture or fluid level. Soft tissues: Negative. CT CERVICAL SPINE FINDINGS Alignment: Normal. Skull base and vertebrae: No acute fracture. Vertebral body heights are maintained. The dens and skull base are intact. Soft tissues and spinal canal: No prevertebral fluid or swelling. No visible canal hematoma. Disc levels: Multilevel endplate spurring, disc space narrowing throughout, most prominent at C6-C7. Multilevel facet arthropathy. Upper  chest: Biapical pleuroparenchymal scarring, right greater than left. Other: Carotid calcifications. IMPRESSION: 1.  No acute intracranial abnormality.  No skull fracture. 2. Stable degree of atrophy and chronic small vessel ischemia. 3. No facial bone fracture. 4. Degenerative change throughout the cervical spine without acute fracture or subluxation. Electronically Signed   By: Rubye Oaks M.D.   On: 06/20/2017 02:23     Procedures   ____________________________________________   INITIAL IMPRESSION / ASSESSMENT AND PLAN / ED COURSE  As part of my medical decision making, I reviewed the following data within the electronic MEDICAL RECORD NUMBER  84 year old male presented with above-stated history and physical exam following accidental fall with facial injury.  Given anticoagulation and apparent facial injury CT head cervical spine and  maxillofacial was performed which were negative per the radiologist.  Regarding the patient's urinary retention Foley catheter was inserted as per family and patient request with 11 mL's of urine obtained. ____________________________________________  FINAL CLINICAL IMPRESSION(S) / ED DIAGNOSES  Final diagnoses:  Contusion of face, initial encounter  Acute urinary retention     MEDICATIONS GIVEN DURING THIS VISIT:  Medications - No data to display   ED Discharge Orders    None       Note:  This document was prepared using Dragon voice recognition software and may include unintentional dictation errors.    Darci Current, MD 06/20/17 [phone]

## 2017-06-20 NOTE — ED Notes (Signed)
Foley catheter placed due to pt chronic urinary retention. Pt tolerated well. Pt and his family educated on how to prevent UTI. Verbalized understanding.

## 2017-06-20 NOTE — ED Triage Notes (Signed)
[facility] after [facility] at [facility] just prior to arriva. Pt has obvious swelling to nose and was said to have a nose bleed prior to arrival. FSBS 104. Denies dizziness or pain. Answering questions appropriately.

## 2017-06-21 NOTE — Progress Notes (Addendum)
ED Antimicrobial Stewardship Positive Culture Follow Up   Zachary RavelSamuel D Paolucci is an 84 y.o. male who presented to [facility] on 06[facility]17[facility]2017 with a chief complaint of weakness and cloudy urine for the past several weeks. Patient self-cath's at home and urine culture growing serratia marcescens.   [x]  Treated with doxycycline 100 mg twice daily for 14 days, organism resistant to prescribed antimicrobial  New antibiotic prescription: Ciprofloxacin 500 mg PO twice daily for 10 days  Spoke with patients wife over the phone and discussed stopping the doxycycline and starting ciprofloxacin and the importance of finishing the entire course. Called new antibiotic in to [facility] in [place], [place].   ED Provider: Scotty CourtStafford   Chief Complaint  Patient presents with  . Weakness    Recent Results (from the past 720 hour(s))  Urine Culture     Status: None   Collection Time: 06/03/17  9:39 PM  Result Value Ref Range Status   Specimen Description   Final    URINE, RANDOM Performed at [facility], [address] [place], [place] [address]    Special Requests   Final    NONE Performed at [facility], [address] [place], [place] [address]    Culture   Final    NO GROWTH Performed at [facility], [address] [place], [place] [address]    Report Status 06/05/2017 FINAL  Final  Urine culture     Status: Abnormal   Collection Time: 06/17/17  8:57 PM  Result Value Ref Range Status   Specimen Description   Final    URINE, CLEAN CATCH Performed at [facility], [address] [place], [place] [address]    Special Requests   Final    NONE Performed at [facility], [address] [place], [place] [address]    Culture >=100,000 COLONIES/mL SERRATIA MARCESCENS (A)  Final   Report Status 06/20/2017 FINAL  Final   Organism ID, Bacteria SERRATIA MARCESCENS (A)  Final      Susceptibility   Serratia marcescens - MIC*    CEFAZOLIN  >=64 RESISTANT Resistant     CEFTRIAXONE <=1 SENSITIVE Sensitive     CIPROFLOXACIN <=0.25 SENSITIVE Sensitive     GENTAMICIN <=1 SENSITIVE Sensitive     NITROFURANTOIN 256 RESISTANT Resistant     TRIMETH/SULFA <=20 SENSITIVE Sensitive     * >=100,000 COLONIES/mL SERRATIA MARCESCENS  Chlamydia/NGC rt PCR ([facility] only)     Status: None   Collection Time: 06/17/17  8:59 PM  Result Value Ref Range Status   Specimen source GC/Chlam PCR  Final   Chlamydia Tr NOT DETECTED NOT DETECTED Final   N gonorrhoeae NOT DETECTED NOT DETECTED Final    Comment: (NOTE) 100  This methodology has not been evaluated in pregnant women or in 200  patients with a history of hysterectomy. 300 400  This methodology will not be performed on patients less than 5014  years of age. Performed at [facility], [address] [place], [place] [address]     Cleopatra CedarStephanie Lavaun [place], PharmD Pharmacy Resident  06/21/2017, 9:19 AM

## 2017-06-27 ENCOUNTER — Emergency Department: Payer: Medicare HMO

## 2017-06-27 ENCOUNTER — Emergency Department
Admission: EM | Admit: 2017-06-27 | Discharge: 2017-06-27 | Disposition: A | Discharge status: Home / Self Care | Payer: Medicare HMO | Attending: Emergency Medicine | Admitting: Emergency Medicine

## 2017-06-27 ENCOUNTER — Encounter: Payer: Self-pay | Admitting: Emergency Medicine

## 2017-06-27 DIAGNOSIS — Z79899 Other long term (current) drug therapy: Secondary | ICD-10-CM | POA: Diagnosis not present

## 2017-06-27 DIAGNOSIS — Z66 Do not resuscitate: Secondary | ICD-10-CM | POA: Diagnosis not present

## 2017-06-27 DIAGNOSIS — R4182 Altered mental status, unspecified: Secondary | ICD-10-CM | POA: Diagnosis not present

## 2017-06-27 DIAGNOSIS — Z7901 Long term (current) use of anticoagulants: Secondary | ICD-10-CM | POA: Diagnosis not present

## 2017-06-27 DIAGNOSIS — G4700 Insomnia, unspecified: Secondary | ICD-10-CM | POA: Insufficient documentation

## 2017-06-27 DIAGNOSIS — Z87891 Personal history of nicotine dependence: Secondary | ICD-10-CM | POA: Diagnosis not present

## 2017-06-27 DIAGNOSIS — I5022 Chronic systolic (congestive) heart failure: Secondary | ICD-10-CM | POA: Diagnosis not present

## 2017-06-27 DIAGNOSIS — J449 Chronic obstructive pulmonary disease, unspecified: Secondary | ICD-10-CM | POA: Insufficient documentation

## 2017-06-27 DIAGNOSIS — I110 Hypertensive heart disease with heart failure: Secondary | ICD-10-CM | POA: Insufficient documentation

## 2017-06-27 DIAGNOSIS — R040 Epistaxis: Secondary | ICD-10-CM | POA: Diagnosis present

## 2017-06-27 LAB — APTT: aPTT: 40 seconds — ABNORMAL HIGH (ref 24–36)

## 2017-06-27 LAB — URINALYSIS, COMPLETE (UACMP) WITH MICROSCOPIC
Bilirubin Urine: NEGATIVE
Glucose, UA: NEGATIVE mg/dL
KETONES UR: NEGATIVE mg/dL
LEUKOCYTES UA: NEGATIVE
NITRITE: NEGATIVE
PROTEIN: NEGATIVE mg/dL
RBC / HPF: >50 RBC/hpf — ABNORMAL HIGH (ref 0–5)
SQUAMOUS EPITHELIAL / LPF: NONE SEEN (ref 0–5)
Specific Gravity, Urine: 1.009 (ref 1.005–1.030)
pH: 8 (ref 5.0–8.0)

## 2017-06-27 LAB — CBC WITH DIFFERENTIAL/PLATELET
Basophils Absolute: 0.1 10*3/uL (ref 0–0.1)
Basophils Relative: 1 %
EOS ABS: 0.8 10*3/uL — AB (ref 0–0.7)
Eosinophils Relative: 8 %
HCT: 40.6 % (ref 40.0–52.0)
HEMOGLOBIN: 13.3 g/dL (ref 13.0–18.0)
LYMPHS ABS: 2.1 10*3/uL (ref 1.0–3.6)
LYMPHS PCT: 22 %
MCH: 27.9 pg (ref 26.0–34.0)
MCHC: 32.7 g/dL (ref 32.0–36.0)
MCV: 85.3 fL (ref 80.0–100.0)
MONOS PCT: 9 %
Monocytes Absolute: 0.9 10*3/uL (ref 0.2–1.0)
NEUTROS PCT: 60 %
Neutro Abs: 5.9 10*3/uL (ref 1.4–6.5)
Platelets: 314 10*3/uL (ref 150–440)
RBC: 4.76 MIL/uL (ref 4.40–5.90)
RDW: 16.3 % — ABNORMAL HIGH (ref 11.5–14.5)
WBC: 9.8 10*3/uL (ref 3.8–10.6)

## 2017-06-27 LAB — LACTIC ACID, PLASMA: LACTIC ACID, VENOUS: 1.1 mmol/L (ref 0.5–1.9)

## 2017-06-27 LAB — BASIC METABOLIC PANEL
Anion gap: 9 (ref 5–15)
BUN: 16 mg/dL (ref 6–20)
CO2: 27 mmol/L (ref 22–32)
CREATININE: 1.17 mg/dL (ref 0.61–1.24)
Calcium: 9 mg/dL (ref 8.9–10.3)
Chloride: 107 mmol/L (ref 101–111)
GFR calc Af Amer: >60 mL/min (ref >60)
GFR calc non Af Amer: 56 mL/min — ABNORMAL LOW (ref >60)
GLUCOSE: 85 mg/dL (ref 65–99)
POTASSIUM: 3.5 mmol/L (ref 3.5–5.1)
Sodium: 143 mmol/L (ref 135–145)

## 2017-06-27 LAB — PROTIME-INR
INR: 1.1
Prothrombin Time: 14.1 seconds (ref 11.4–15.2)

## 2017-06-27 LAB — HEPATIC FUNCTION PANEL
ALK PHOS: 80 U/L (ref 38–126)
ALT: 11 U/L — AB (ref 17–63)
AST: 23 U/L (ref 15–41)
Albumin: 3.6 g/dL (ref 3.5–5.0)
Total Bilirubin: 0.7 mg/dL (ref 0.3–1.2)
Total Protein: 7.7 g/dL (ref 6.5–8.1)

## 2017-06-27 LAB — LIPASE, BLOOD: Lipase: 35 U/L (ref 11–51)

## 2017-06-27 LAB — TROPONIN I: Troponin I: <0.03 ng/mL (ref <0.03)

## 2017-06-27 MED ORDER — TRAZODONE HCL 50 MG PO TABS: 50.0000 mg | ORAL_TABLET | Freq: Every day | ORAL | 1 refills | Status: AC

## 2017-06-27 MED ORDER — OXYMETAZOLINE HCL 0.05 % NA SOLN
NASAL | Status: AC
  Filled 2017-06-27: qty 15

## 2017-06-27 MED ORDER — SILVER NITRATE-POT NITRATE 75-25 % EX MISC
CUTANEOUS | Status: AC
  Filled 2017-06-27: qty 1

## 2017-06-27 MED ORDER — TRANEXAMIC ACID 1000 MG/10ML IV SOLN
500.0000 mg | Freq: Once | INTRAVENOUS | Status: AC
  Administered 2017-06-27: 500 mg via TOPICAL
  Filled 2017-06-27: qty 10

## 2017-06-27 NOTE — Progress Notes (Signed)
Please note patient is currently followed by outpatient Palliative. CSW KingsburyJeneya aware.   Thank you. Dayna BarkerKaren Robertson RN, BSN, [facility] [place], [place] Liaison [phone]

## 2017-06-27 NOTE — Progress Notes (Signed)
ED visit made. Patient is currently followed by outpatient Palliative at home. Patient to the [facility]  ED today for treatment of a epistaxis. He has had 7 ED visits and 3 hospital admissions in the last 6 months. Family expressed interest in patient returning home with the support of hospice services. Writer met in the ED exam room with patient's wife Barnett Applebaum and daughter [name] to intiate eduction regarding hospice services,. Philosophy and team approach to care with good understanding voiced. Patient appeared to be sleeping and did not participate in the conversation. Family related that he had not slept at all overnight. He nose had stopped bleeding and head CT was clear for any acute process/injuries. Plan is for discharge home with the support of hospice services. No DME needed at discharge. Hospital care team updated. ED MD note faxed to referral. Thank you. Flo Shanks RN, BSN, [facility] [place], hospital liaison [phone]

## 2017-06-27 NOTE — Care Management (Signed)
After family deliberation, decision was collectively made to go home with hospice. After providing choice and a list of facilities, [facility] has been selected. Referral placed with Horace PorteousKaren Roberston whom has assessed [patient and situation at this point. Family to provide transportation. RNCM to sign off. Buddy DutyJosh Rushawn Capshaw RN BSN RNCM [phone]

## 2017-06-27 NOTE — ED Notes (Signed)
Nose is bleeding again at this time, afrin sprayed and nose clamp applied again per MD. Will continue to monitor

## 2017-06-27 NOTE — ED Provider Notes (Signed)
-----------------------------------------   12:04 PM on 06/27/2017 -----------------------------------------   Blood pressure (!) 172/90, pulse 79, temperature 97.8 F (36.6 C), temperature source Oral, resp. rate 18, weight 62.1 kg (137 lb), SpO2 94 %.  Assuming care from Dr. York CeriseForbach of Zach[facility] RavelSamuel D Cart Sr. is a 84 y.o. male with a chief complaint of Epistaxis .    Please refer to H&P by previous MD for further details.  The current plan of care is to CT head for acute medical clearance and social work evaluation for home health.   I have personally reviewed the images performed during this visit and I agree with the Radiologist's read.   Interpretation by Radiologist:  Ct Head Wo Contrast  Result Date: 06/27/2017 CLINICAL DATA:  Altered mental status.  Recent fall.  Epistaxis. EXAM: CT HEAD WITHOUT CONTRAST TECHNIQUE: Contiguous axial images were obtained from the base of the skull through the vertex without intravenous contrast. COMPARISON:  Head CT June 20, 2017; January 26, 2017 FINDINGS: Brain: Moderate diffuse atrophy is stable. There is no intracranial mass, hemorrhage, extra-axial fluid collection, or midline shift. There is patchy small vessel disease in the centra semiovale bilaterally, stable. There is evidence of a prior tiny lacunar infarct in the posterior limb of the right lentiform nucleus. There is no new gray-white compartment lesion. No acute infarct evident. Vascular: No hyperdense vessels evident. There is calcification in each carotid siphon region. Skull: Bony calvarium appears intact. Sinuses/Orbits: There is mucosal thickening in several ethmoid air cells. Other visualized paranasal sinuses are clear. Orbits appear symmetric bilaterally. Other: Mastoid air cells are clear. IMPRESSION: Stable atrophy with supratentorial small vessel disease. No acute infarct. No mass or hemorrhage. There are foci of arterial vascular calcification. There is mucosal thickening in several  ethmoid air cells. Electronically Signed   By: Bretta BangWilliam  Woodruff III M.D.   On: 06/27/2017 08:10   Dg Chest Portable 1 View  Result Date: 06/27/2017 CLINICAL DATA:  Recent pneumonia. Altered mental status and weakness. EXAM: PORTABLE CHEST 1 VIEW COMPARISON:  06/03/2017 FINDINGS: Heart size and pulmonary vascularity are normal. Emphysematous changes in the lungs with scarring in the lung apices. No focal consolidation or edema. No blunting of costophrenic angles. No pneumothorax. Calcification of the aorta. IMPRESSION: Emphysematous changes in the lungs with scarring in the apices. No evidence of active pulmonary disease. Aortic atherosclerosis. Electronically Signed   By: Burman NievesWilliam  Stevens M.D.   On: 06/27/2017 06:57      CT head negative for acute findings.  Labs and urine with no acute etiology to explain patient's abnormal behavior which is most likely due to dementia.  Patient remains alert and oriented x2, recognize family members, is currently at baseline.  His epistaxis recurred and I was able to stop that with silver nitrate cauterization.  Patient was seen by social work and is going to go home on hospice care.   Epistaxis Management Date/Time: 06/27/2017 at 12:05 PM Performed by: Nita Sicklearolina Kennya Schwenn Authorized by: Nita Sicklearolina Venia Riveron Consent: Verbal consent obtained. Written consent not obtained. Risks and benefits: risks, benefits and alternatives were discussed Consent given by: patient Required items: required blood products, implants, devices, and special equipment available Patient sedated: no Repair method: silver nitrate cautery Post-procedure assessment: bleeding stopped after 15 minutes with gauze in place Treatment complexity: simple Patient tolerance: Patient tolerated the procedure well with no immediate complications         Nita SickleVeronese, Marianna, MD 06/27/17 1205

## 2017-06-27 NOTE — ED Notes (Signed)
Nose clamp removed at this time, no bleeding noted. Will continue to monitor

## 2017-06-27 NOTE — ED Triage Notes (Signed)
Pt arrived via EMS from home where report is; pt has had epistaxis x12 hours. Pt is bleeding currently out of right nares. Clamp applied. Pt on Eliquis and recently taken off of Warfarin.

## 2017-06-27 NOTE — Clinical Social Work Note (Signed)
CSW received consult for "No admittable diagnoses, but severely diminished capacity, confusion, difficult to manage at home. Family has been considering hospice but is at the end of their ropes."   CSW staffed with EDP Dr. Alfred Levins and EDRN [place]. CSW staffed with Nurse Case Managers ([place]) Levada Dy and [place]. CSW and RNCMs to see patient together. CSW also spoke with hospital liaison Flo Shanks for [facility], who stated patient is being followed by Palliative team and is eligible to receive Hospice services in the home.   CSW met with patient and family at bedside, along with RNCMs Levada Dy and [place]. Patient from home with wife and assistance from daughters. RNCM Levada Dy provided hospice choice. Resources also provided for private duty nursing and PACE. CSW to make referral [facility], as patient is a [facility] resident.   RNCM Angela following for discharge needs. Family to transport patient home when discharged. CSW signing off as no further Social Work intervention identified.   Oretha Ellis, Latanya Presser, [place] Worker-Emergency Department [phone]

## 2017-06-27 NOTE — ED Notes (Addendum)
CSW and case management at bedside at this time

## 2017-06-27 NOTE — Care Management Note (Signed)
Case Management Note  Patient Details  Name: Zachary KIRCHGESSNER Sr. MRN: [id number] Date of Birth: Mar 10, 1931  Subjective/Objective:                  RNCM met with patient, wife, daughter [name] and briefly daughter [name] was in the ED room.  RNCM presented Hospice agency list, Home health list, Private duty agency list under private pay, and [place] information.  Patient is from home with urinary Foley and his wife.  Per [name] he is maximum assist with walker however she states that he can ride in a car. He is oozing bright red blood from his right nare. MD is going to attempt to stop bleeding by cauterization and if that doesn't work by placing tampon in nare and patient follow up with ENT.  Patient has home visiting PCP  [name] which was set up by current home health provider [facility]. He is also followed by Palliative in the home.   Action/Plan: RNCM will follow for disposition. CSW will make referral to [facility]ACE.  Santiago Glad with [facility] requested by [name] to meet with family to answer questions. Patient disposition currently undecided.   Expected Discharge Date:                  Expected Discharge Plan:     In-House Referral:     Discharge planning Services  CM Consult  Post Acute Care Choice:  Home Health, Hospice Choice offered to:  Patient, Spouse, Adult Children  DME Arranged:    DME Agency:     HH Arranged:    [place] Agency:     Status of Service:  In process, will continue to follow  If discussed at Long Length of Stay Meetings, dates discussed:    Additional Comments:  [name], RN 06/27/2017, 11:16 AM

## 2017-06-27 NOTE — ED Provider Notes (Signed)
[facility] Provider Note  ____________________________________________   First MD Initiated Contact with Patient 06/27/17 [phone]     (approximate)  I have reviewed the triage vital signs and the nursing notes.   HISTORY  Chief Complaint Epistaxis  Level 5 caveat:  history/ROS limited by altered mental status/confusion   HPI Zachary RavelSamuel D Masley Sr. is a 84 y.o. male with extensive chronic medical history as listed below and who takes Eliquis for at least paroxysmal if not persistent atrial fibrillation.  He presents by EMS for a variety of complaints.  The most acute complaint is acute onset nosebleed from the right naris that started within the last 1 to 2 hours.  However he also complained to the paramedics that he was having chest pain although he no longer remembers that.  His wife and his daughter are in the exam room when I evaluated him and report that for the last 4 days he has been "out of his mind".  Although he has some age-related memory issues, his family reports that for at least 4 days he has acted like somebody with severe dementia, argumentative and almost combative at times, not remembering his wife's name, not knowing where he is or what he is doing, etc.  He has not been able to care for himself recently.  His symptoms are severe and have been acute in onset over the last 4 days.  He had a recent fall out of the bed onto his face but managed to avoid any severe injury although that may have been what caused the initial nosebleed.  His daughter reports that he picks at it and that is likely what started the nosebleed again this morning.  Overall they describe his symptoms as severe and nothing is making them better and they seem to be getting worse over time.  He does have an indwelling Foley catheter due to some persistent retention issues and he has been on 2 different types of antibiotics recently for a persistent urinary tract  infection.  His family has been discussing the possibility of hospice placement.  He had been in rehabilitation previously but they were not happy with the care he was receiving so they took him home.  The patient is awake but not really following the conversation.  He will respond to questions and is relatively cheerful, occasionally chuckling and responding appropriately to some questions.  He denies any pain.  He denies fever/chills, chest pain, shortness of breath, nausea, vomiting, and abdominal pain.  He does say his nose is bothering a little bit and he has a nose clip in place at this time.  Past Medical History:  Diagnosis Date  . Back pain   . COPD (chronic obstructive pulmonary disease) (HCC)   . Depression   . Fatigue    a. in setting of Afib, improved following DCCV.  [name]. Hearing loss   . History of GI bleed 1976  . Hyperlipemia   . Hypertension   . Migraine   . Palpitations   . Persistent atrial fibrillation (HCC)    a. 02/2016 Echo: EF 66-65%, mild MR;  b. CHA2DS2VASc = 3-->coumadin;  c. 05/2016 TEE/DCCV: EF 45-50%, triv AI, mild to mod MR, no LA/LAA/RA/RAA thrombus, mod to sev TR-->successful DCCV.  [name]. Pneumonia   . Seizure (HCC)   . Syncope    a. related to seizure disorder    Patient Active Problem List   Diagnosis Date Noted  . Mixed hyperlipidemia 06/05/2017  .  Pseudomonal sepsis (HCC) 02/28/2017  . Palliative care encounter   . Cryptogenic organizing pneumonia (HCC) 02/25/2017  . Influenza A 02/25/2017  . Failure to thrive in adult 02/24/2017  . Abnormal chest x-ray   . Chronic systolic CHF (congestive heart failure) (HCC)   . Palliative care by specialist   . DNR (do not resuscitate)   . Weakness generalized   . Protein-calorie malnutrition, severe 02/18/2017  . HCAP (healthcare-associated pneumonia) 02/15/2017  . Acute respiratory failure with hypoxia (HCC)   . Dysphagia   . Chronic obstructive pulmonary disease (HCC)   . PAF (paroxysmal atrial  fibrillation) (HCC)   . Benign essential HTN   . Hypophosphatemia   . Leukocytosis   . Acute blood loss anemia   . Aspiration pneumonia (HCC) 01/27/2017  . Endotracheally intubated 01/27/2017  . Acute hypercapnic respiratory failure (HCC)   . At high risk for aspiration   . Persistent atrial fibrillation (HCC) 06/06/2016  . Difficulty in walking, not elsewhere classified   . Muscle weakness (generalized)   . Diarrhea   . Seizure (HCC) 02/21/2016  . Viral gastroenteritis 02/21/2016  . Hypokalemia 02/21/2016  . Demand ischemia of myocardium (HCC) 02/21/2016  . Essential hypertension 02/21/2016    Past Surgical History:  Procedure Laterality Date  . APPENDECTOMY    . BACK SURGERY    . CARDIOVERSION N/A 06/06/2016   Procedure: CARDIOVERSION;  Surgeon: Yvonne Kendall, MD;  Location: [facility] ORS;  Service: Cardiovascular;  Laterality: N/A;  . CATARACT EXTRACTION    . hemmoridectomy    . HERNIA REPAIR    . TEE WITHOUT CARDIOVERSION N/A 06/06/2016   Procedure: TRANSESOPHAGEAL ECHOCARDIOGRAM (TEE);  Surgeon: Yvonne Kendall, MD;  Location: [facility] ORS;  Service: Cardiovascular;  Laterality: N/A;    Prior to Admission medications   Medication Sig Start Date End Date Taking? Authorizing Provider  acetaminophen (TYLENOL) 500 MG tablet Take 1,000 mg by mouth every 6 (six) hours as needed for fever.    [provider]  bisacodyl (DULCOLAX) 5 MG EC tablet Take 1 tablet (5 mg total) by mouth daily as needed for moderate constipation. 02/28/17   Calvert Cantor, MD  bisoprolol (ZEBETA) 5 MG tablet TAKE 1/2 TABLET BY MOUTH TWICE DAILY 03/14/17   End, Cristal Deer, MD  budesonide-formoterol (SYMBICORT) 80-4.5 MCG/ACT inhaler Inhale 2 puffs into the lungs 2 (two) times daily. 02/28/17   Calvert Cantor, MD  calcium carbonate (TUMS - DOSED IN MG ELEMENTAL CALCIUM) 500 MG chewable tablet Chew 1 tablet (200 mg of elemental calcium total) by mouth 3 (three) times daily with meals as needed for indigestion  or heartburn. 02/24/16   Gouru, Deanna Artis, MD  cephALEXin (KEFLEX) 500 MG capsule Take 1 capsule (500 mg total) by mouth 2 (two) times daily. 06/03/17   Minna Antis, MD  diazepam (VALIUM) 5 MG tablet Take 5 mg by mouth 2 (two) times daily.    [provider]  doxycycline (VIBRAMYCIN) 100 MG capsule Take 1 capsule (100 mg total) by mouth 2 (two) times daily for 14 days. 06/17/17 07/01/17  Merrily Brittle, MD  DULoxetine (CYMBALTA) 30 MG capsule Take 30 mg by mouth daily.    [provider]  ezetimibe-simvastatin (VYTORIN) 10-80 MG tablet Take 1 tablet by mouth daily.    [provider]  fluticasone (FLONASE) 50 MCG/ACT nasal spray Place 2 sprays into both nostrils daily as needed for allergies.     [provider]  ipratropium-albuterol (DUONEB) 0.5-2.5 (3) MG/3ML SOLN Take 3 mLs by nebulization 4 (four)  times daily. 02/28/17   Calvert Cantor, MD  lacosamide (VIMPAT) 200 MG TABS tablet Take 1 tablet (200 mg total) by mouth 2 (two) times daily. 02/22/17   Milagros Loll, MD  mirtazapine (REMERON) 15 MG tablet Take 15 mg by mouth at bedtime.    [provider]  ondansetron (ZOFRAN ODT) 8 MG disintegrating tablet Take 1 tablet (8 mg total) by mouth every 8 (eight) hours as needed for nausea or vomiting. 04/13/14   Kirichenko, Tatyana, PA-C  pantoprazole (PROTONIX) 20 MG tablet Take 20 mg by mouth daily.    [provider]  pregabalin (LYRICA) 75 MG capsule Take 75 mg by mouth 2 (two) times daily.    [provider]  senna-docusate (SENOKOT-S) 8.6-50 MG tablet Take 1 tablet by mouth at bedtime as needed for mild constipation. 02/28/17   Calvert Cantor, MD  tamsulosin (FLOMAX) 0.4 MG CAPS capsule Take 2 capsules (0.8 mg total) by mouth daily after breakfast. 02/10/17   Penny Pia, MD  traZODone (DESYREL) 50 MG tablet Take 1 tablet (50 mg total) by mouth at bedtime. 06/27/17   Loleta Rose, MD  warfarin (COUMADIN) 2.5 MG tablet TAKE 1 OR 2 TABLETS BY  MOUTH EVERY DAY OR AS PHYSICIAN INSTRUCTED BY COUMADIN CLINIC 09/25/16   End, Cristal Deer, MD  warfarin (COUMADIN) 2.5 MG tablet Take 5 mg by mouth daily.    [provider]    Allergies Ativan [lorazepam]; Aspirin; Codeine; and Oxycodone  Family History  Problem Relation Age of Onset  . Heart disease Mother   . Hypertension Father     Social History Social History   Tobacco Use  . Smoking status: Former Games developer  . Smokeless tobacco: Current User  Substance Use Topics  . Alcohol use: No  . Drug use: No    Review of Systems Level 5 caveat:  history/ROS limited by altered mental status/confusion  ____________________________________________   PHYSICAL EXAM:  VITAL SIGNS: ED Triage Vitals  Enc Vitals Group     BP 06/27/17 0529 (!) 147/94     Pulse Rate 06/27/17 0529 81     Resp 06/27/17 0529 17     Temp 06/27/17 0529 97.8 F (36.6 C)     Temp Source 06/27/17 0529 Oral     SpO2 06/27/17 0529 98 %     Weight 06/27/17 0524 62.1 kg (137 lb)     Height --      Head Circumference --      Peak Flow --      Pain Score --      Pain Loc --      Pain Edu? --      Excl. in GC? --     Constitutional: Awake and alert but not oriented.  He currently knows his family members and his own name but does not seem to be fully aware of what is going on. Eyes: Conjunctivae are normal.  Head: Atraumatic. Nose: Slow ooze of fresh blood from his right naris without any evidence of septal hematoma or other acute issue.  No obvious lesion that could be cauterized. Mouth/Throat: Mucous membranes are moist.  Dried blood is present in the oropharynx from his epistaxis Neck: No stridor.  No meningeal signs.   Cardiovascular: Normal rate, regular rhythm. Good peripheral circulation. Grossly normal heart sounds. Respiratory: Normal respiratory effort.  No retractions. Lungs CTAB. gastrointestinal: Soft and nontender. No distention.  Musculoskeletal: No lower extremity tenderness nor  edema. No gross deformities of extremities. Neurologic:  Normal speech  and language. No gross focal neurologic deficits are appreciated.  Skin:  Skin is warm, dry and intact. No rash noted.   ____________________________________________   LABS (all labs ordered are listed, but only abnormal results are displayed)  Labs Reviewed  CBC WITH DIFFERENTIAL/PLATELET - Abnormal; Notable for the following components:      Result Value   RDW 16.3 (*)    Eosinophils Absolute 0.8 (*)    All other components within normal limits  BASIC METABOLIC PANEL - Abnormal; Notable for the following components:   GFR calc non Af Amer 56 (*)    All other components within normal limits  APTT - Abnormal; Notable for the following components:   aPTT 40 (*)    All other components within normal limits  HEPATIC FUNCTION PANEL - Abnormal; Notable for the following components:   ALT 11 (*)    Bilirubin, Direct <0.1 (*)    All other components within normal limits  URINALYSIS, COMPLETE (UACMP) WITH MICROSCOPIC - Abnormal; Notable for the following components:   Color, Urine YELLOW (*)    APPearance CLOUDY (*)    Hgb urine dipstick MODERATE (*)    RBC / HPF >50 (*)    Bacteria, UA RARE (*)    All other components within normal limits  URINE CULTURE  TROPONIN I  PROTIME-INR  LACTIC ACID, PLASMA  LIPASE, BLOOD  LACTIC ACID, PLASMA   ____________________________________________  EKG  ED ECG REPORT I, Loleta Rose, the attending physician, personally viewed and interpreted this ECG.  Date: 06/27/2017 EKG Time: 06:07 Rate: 77 Rhythm: normal sinus rhythm QRS Axis: normal Intervals: normal ST/T Wave abnormalities: Non-specific ST segment / T-wave changes, but no evidence of acute ischemia. Narrative Interpretation: no evidence of acute ischemia   ____________________________________________  RADIOLOGY I, Loleta Rose, personally viewed and evaluated these images (plain radiographs) as part of my  medical decision making, as well as reviewing the written report by the radiologist.  ED MD interpretation:  No acute abnormalities identified  Official radiology report(s): Dg Chest Portable 1 View  Result Date: 06/27/2017 CLINICAL DATA:  Recent pneumonia. Altered mental status and weakness. EXAM: PORTABLE CHEST 1 VIEW COMPARISON:  06/03/2017 FINDINGS: Heart size and pulmonary vascularity are normal. Emphysematous changes in the lungs with scarring in the lung apices. No focal consolidation or edema. No blunting of costophrenic angles. No pneumothorax. Calcification of the aorta. IMPRESSION: Emphysematous changes in the lungs with scarring in the apices. No evidence of active pulmonary disease. Aortic atherosclerosis. Electronically Signed   By: Burman Nieves M.D.   On: 06/27/2017 06:57    ____________________________________________   PROCEDURES  Critical Care performed: No   Procedure(s) performed:   .Epistaxis Management Date/Time: 06/27/2017 7:52 AM Performed by: Loleta Rose, MD Authorized by: Loleta Rose, MD   Consent:    Consent obtained:  Verbal   Consent given by:  Spouse Procedure details:    Treatment site:  R anterior   Treatment method:  Anterior pack (2x2 gauze with TXA)   Treatment complexity:  Limited   Treatment episode: initial   Post-procedure details:    Assessment:  Bleeding stopped   Patient tolerance of procedure:  Tolerated well, no immediate complications     ____________________________________________   INITIAL IMPRESSION / ASSESSMENT AND PLAN / ED COURSE  As part of my medical decision making, I reviewed the following data within the electronic MEDICAL RECORD NUMBER History obtained from family, Nursing notes reviewed and incorporated, Labs reviewed , EKG interpreted , Old chart  reviewed, Patient signed out to , Radiograph reviewed  and Notes from prior ED visits    Differential diagnosis includes, but is not limited to, age-related  dementia worsened by all his recent medical issues, delirium, acute infection, ACS, subacute subdural hematoma or other head bleed, pneumonia, urinary tract infection.  I treated the epistaxis with some Afrin nasal spray and a nose clip, then TXA soaked 2 x 2 gauze.  This seemed to solve the bleeding issue.  I do not think he would tolerate a sponge or Rapid Rhino very well due to his delirium/dementia.  I evaluated him very broadly for any sort of metabolic or infectious abnormality and I have not identified any acute issues and no issues that would justify inpatient admission.  Lactic acid is normal, basic metabolic panel is reassuring, lipase is normal, CBC is normal with no leukocytosis.  In spite of having a urinary catheter, he has no evidence of infection even though he does have some hematuria which is likely secondary to the Foley catheter itself.  Even though his blood pressure is elevated here, they report that his blood pressure is always elevated in the emergency department but at home sometimes it dropped so low that that becomes a problem.  I do not believe this is the cause of his altered mental status at this time and treatment of it could actually worsen other issues such as causing him to become hypotensive.  His family is understandably frustrated by explained at this time I do not have anything that would be treatable in the hospital.  Given his recent trauma I am obtaining a CT scan of his head to rule out any sign of a subacute bleed.  I have also ordered a social work consult to see if they may have some additional outpatient, at home health but they could be of assistance, or if the patient could be placed directly in hospice care at this point.  He did come with DNR/DNI paperwork.  The family agrees with this plan.  They are begging for something to give him at night to help him sleep.  If he ends up going home, I have prescribed trazodone 50 mg at bedtime along with his regular  medications.  I have signed care over to Dr. Don Perking to follow-up on the CT scan and social work consult.     ____________________________________________  FINAL CLINICAL IMPRESSION(S) / ED DIAGNOSES  Final diagnoses:  Altered mental status, unspecified altered mental status type  Right-sided epistaxis  Insomnia, unspecified type     MEDICATIONS GIVEN DURING THIS VISIT:  Medications  oxymetazoline (AFRIN) 0.05 % nasal spray (has no administration in time range)  tranexamic acid (CYKLOKAPRON) injection 500 mg (500 mg Topical Given 06/27/17 0700)     ED Discharge Orders        Ordered    traZODone (DESYREL) 50 MG tablet  Daily at bedtime     06/27/17 0757       Note:  This document was prepared using Dragon voice recognition software and may include unintentional dictation errors.    Loleta Rose, MD 06/27/17 [phone]

## 2017-06-27 NOTE — Discharge Instructions (Addendum)
As we discussed, unfortunately I was not able to find a specific diagnosis that would allow for inpatient treatment.  His medical work-up was generally reassuring.  I have ordered an additional medication to give him at bedtime that may help with his restlessness and insomnia.  Please continue using the rest of his medications as well.  Call his primary care doctor for the next available appointment to follow-up.  Return to the emergency department if you develop new or worsening symptoms that concern you.

## 2017-06-28 LAB — URINE CULTURE
CULTURE: NO GROWTH
Special Requests: NORMAL

## 2017-09-23 ENCOUNTER — Telehealth: Payer: Self-pay | Admitting: Internal Medicine

## 2017-09-23 NOTE — Telephone Encounter (Signed)
Pt daughter states pt is now under hospice care. They have taken him off his blood thinner and asks should pt still come to his appt on Wednesday. Please call and advise.

## 2017-09-24 NOTE — Telephone Encounter (Signed)
S/w patient's daughter, ok per DPR. Patient is on Hospice at home. It is too difficult to get him to go places. He had a small stroke yesterday morning. It has interfered with his speech and face.  Hospice took him off blood thinners because he had gotten up in the night and had fallen. Felt like risk of bleed was greater than risk for stroke. She feels like it would be too difficult to get him to appointment tomorrow. Advised her that I will make Dr End aware and if any further recommendations, I will call her back. Otherwise, I will cancel appointment and she verbalized understanding to call us if any new questions or concerns arise.

## 2017-09-24 NOTE — Telephone Encounter (Signed)
I am sorry to hear if [name]'s stroke and clinical decline.  Since he is now on home hospice and no longer on anticoagulation, I think it is reasonable to cancel his upcoming appointment in order to minimize discomfort and stress for [name] and his family.

## 2017-09-24 NOTE — Telephone Encounter (Signed)
Pt daughter is returing your call.

## 2017-09-25 ENCOUNTER — Ambulatory Visit: Payer: Medicare HMO | Admitting: Internal Medicine

## 2018-03-09 DEATH — deceased

## 2019-03-18 IMAGING — CT CT HEAD W/O CM
4 series · 16 of 47 positions shown, 18 images · non-contrast
Comparison: 02/23/2016

CLINICAL DATA: Pt came to [facility] via EMS from home. Pt had unwitnessed
seizure. Found in bathroom by wife. Postictal upon EMS arrival. Pt
has history of seizures and afib.

EXAM:
CT HEAD WITHOUT CONTRAST
TECHNIQUE: Contiguous axial images were obtained from the base of the skull
through the vertex without intravenous contrast.

[Series 2: head wo · axial · 0.47mm/px · z∈[-140,-35]mm · 7 of 29 slices shown, 9 images]
[im 4/29  brain]
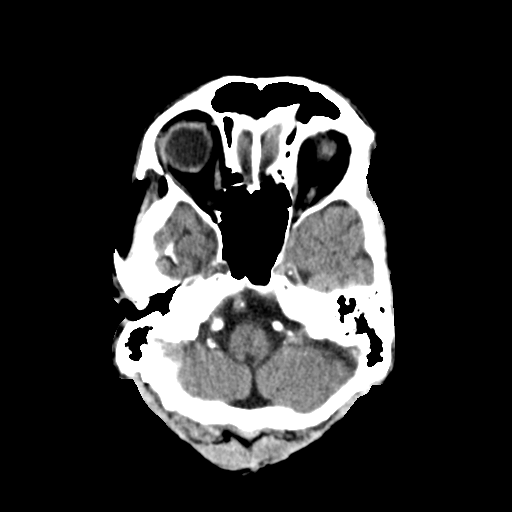
[im 4/29  bone]
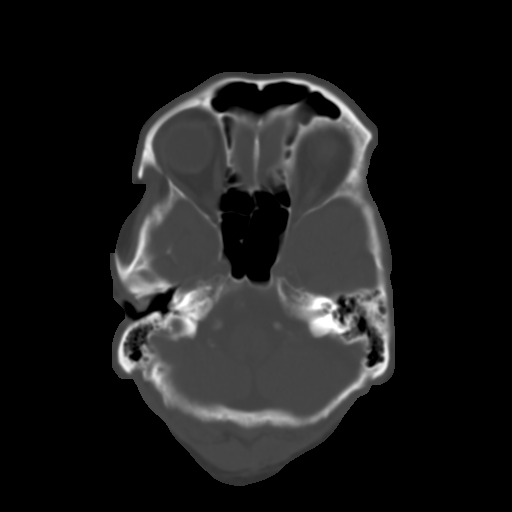
[im 8/29  brain]
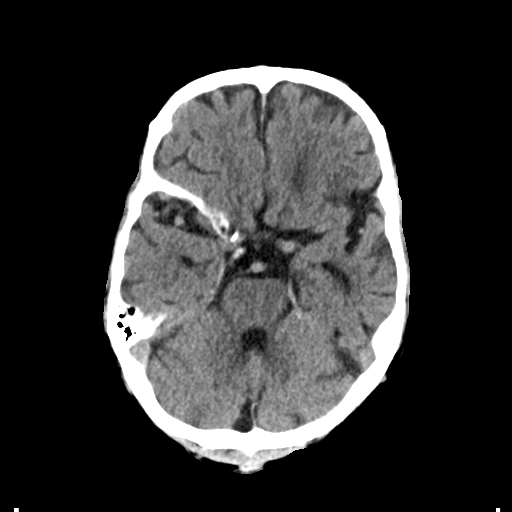
[im 11/29  brain]
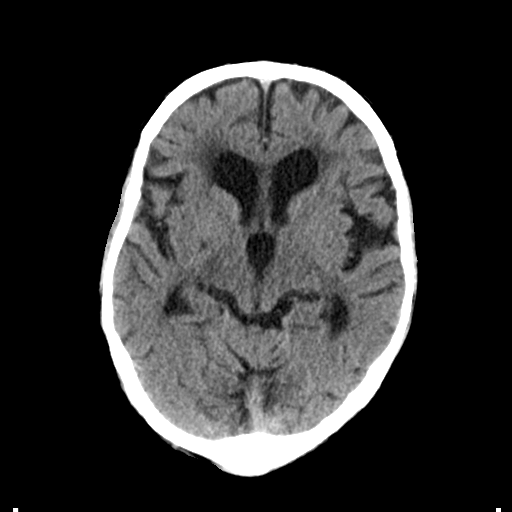
[im 15/29  brain]
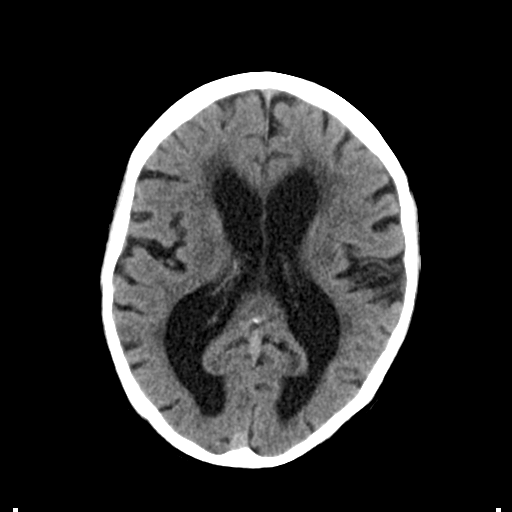
[im 18/29  brain]
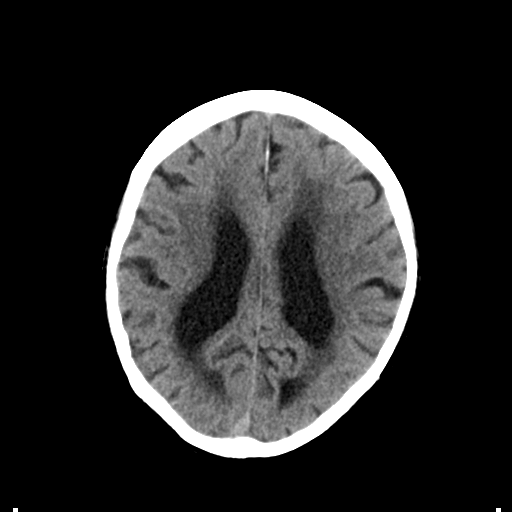
[im 18/29  bone]
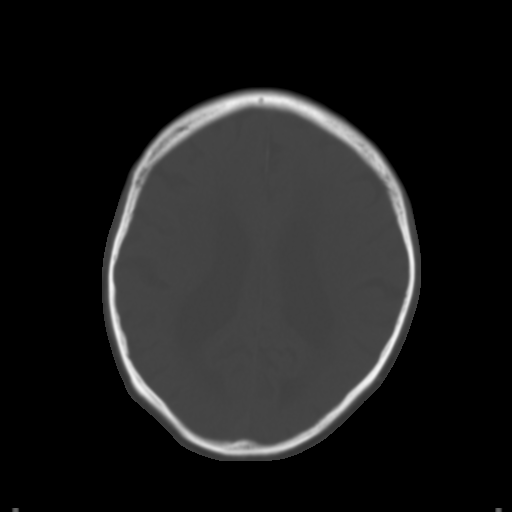
[im 22/29  brain]
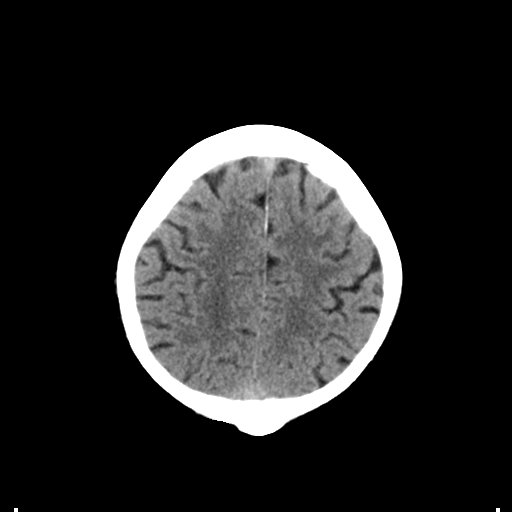
[im 25/29  brain]
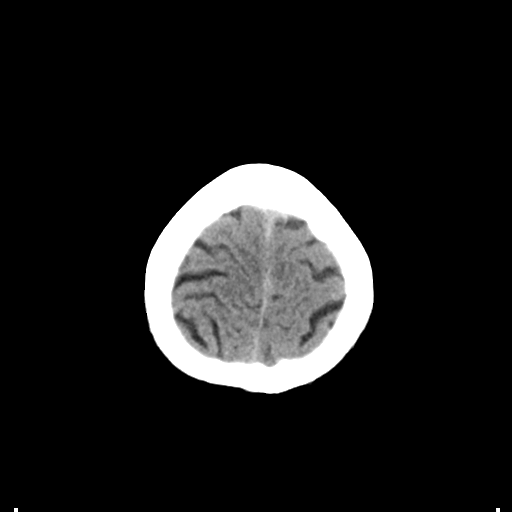

[Series 4: coronal soft tissue · coronal · 0.29mm/px · 3 of 66 slices shown]
[im 22/66  brain]
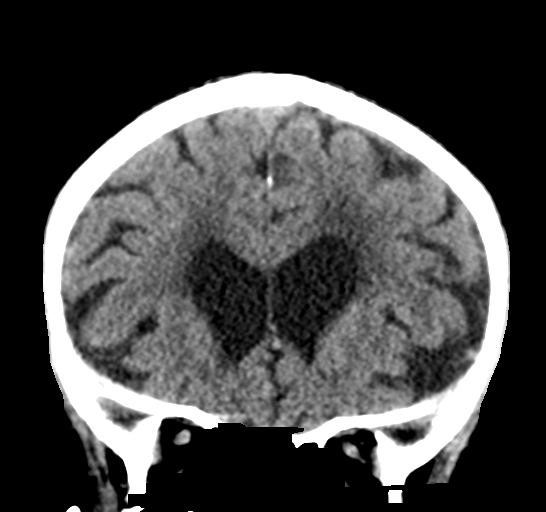
[im 29/66  brain]
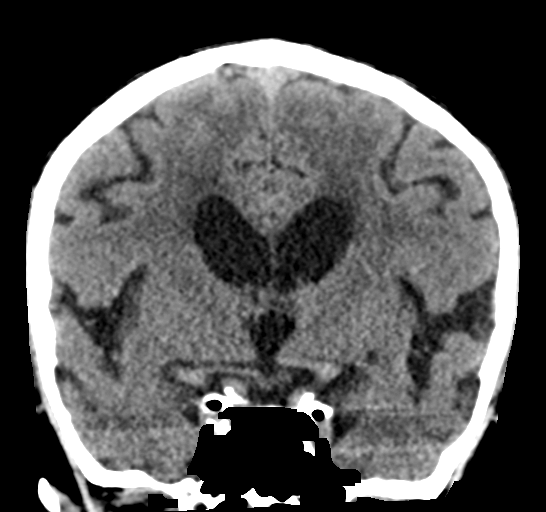
[im 37/66  brain]
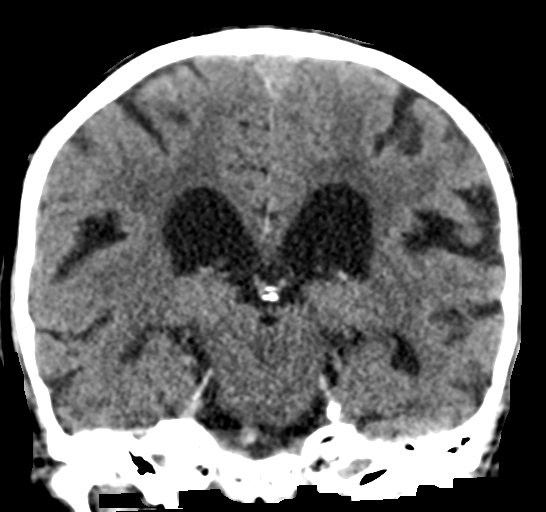

[Series 5: sagittal soft tissue · sagittal · 0.28mm/px · 3 of 52 slices shown]
[im 18/52  brain]
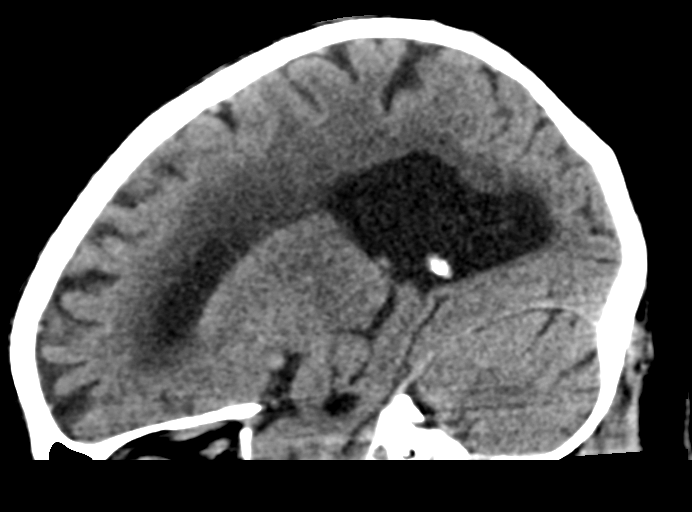
[im 26/52  brain]
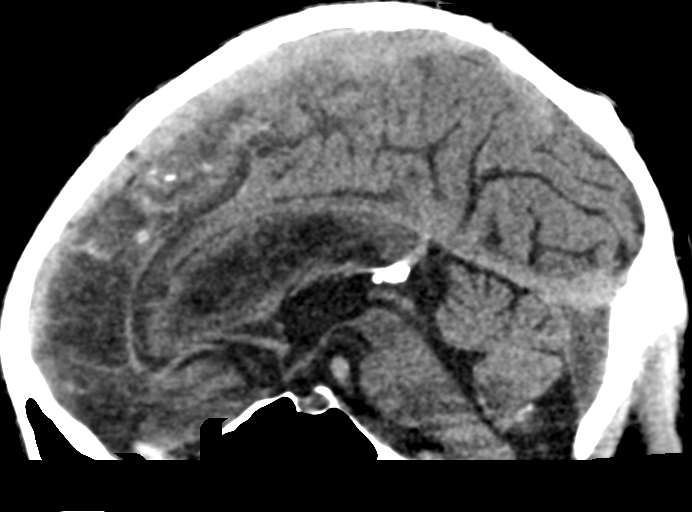
[im 35/52  brain]
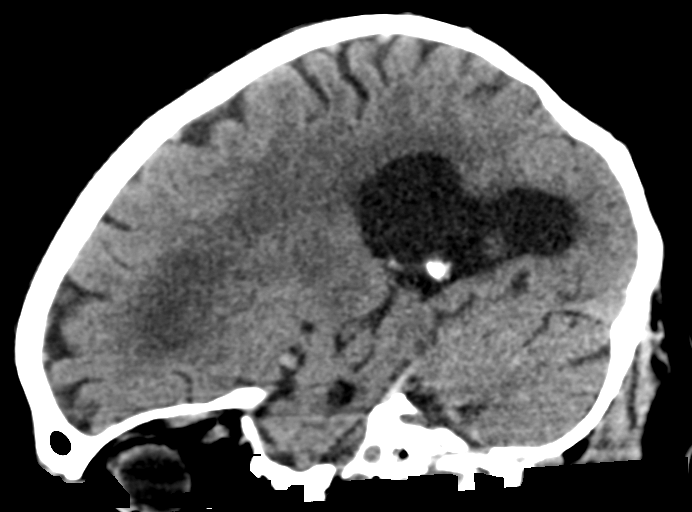

[Series 6: head bone trauma · axial · 0.47mm/px · z∈[-141,-113]mm · 3 of 72 slices shown]
[im 8/72  bone]
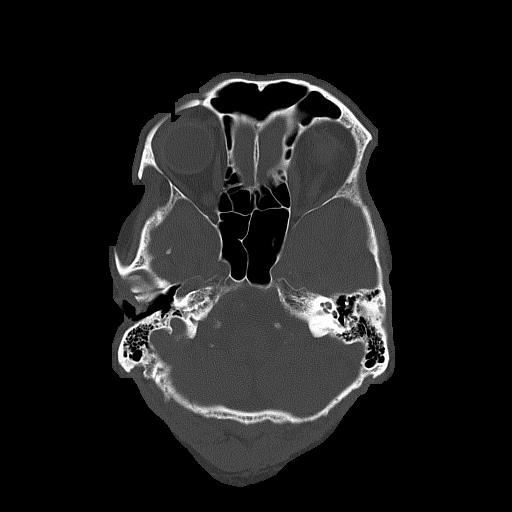
[im 15/72  bone]
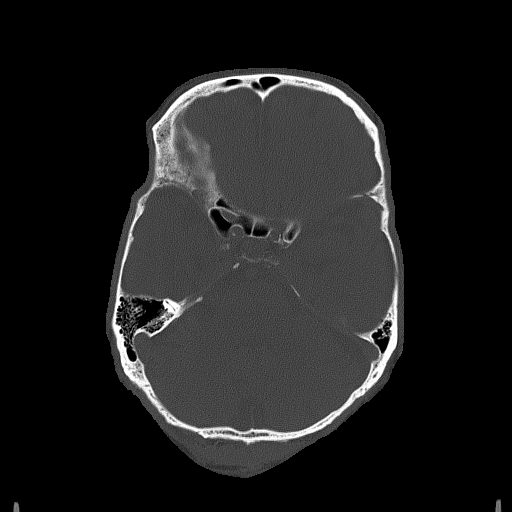
[im 22/72  bone]
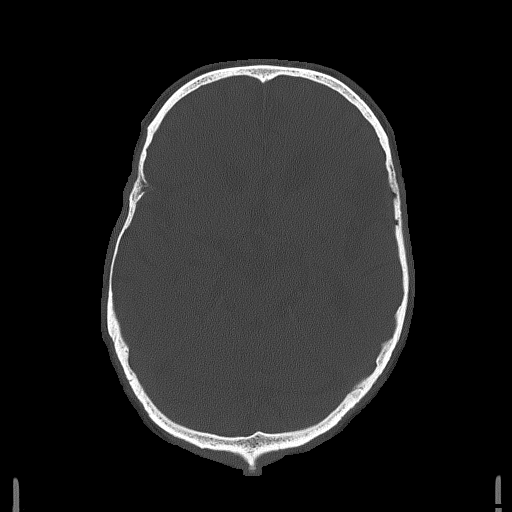

[16 of 47 positions shown; findings below may reference images not displayed]

FINDINGS: Brain: No acute intracranial hemorrhage. No focal mass lesion. No CT
evidence of acute infarction. No midline shift or mass effect. No
hydrocephalus. Basilar cisterns are patent.

There are periventricular and subcortical white matter
hypodensities. Generalized cortical atrophy.

Vascular: No hyperdense vessel or unexpected calcification.

Skull: Normal. Negative for fracture or focal lesion.

Sinuses/Orbits: Paranasal sinuses and mastoid air cells are clear.
Orbits are clear.

Other: None.
IMPRESSION: 1. No acute intracranial findings.  No change from prior.
2. Atrophy, mild ventricular dilatation and mild white matter
microvascular disease again noted.

## 2019-03-18 IMAGING — DX DG CHEST 1V PORT
1 series · 1 of 1 positions shown · non-contrast
Comparison: 01/26/2017 at [DATE] p.m.

CLINICAL DATA: Endotracheal intubation

EXAM:
PORTABLE CHEST 1 VIEW

[chest]
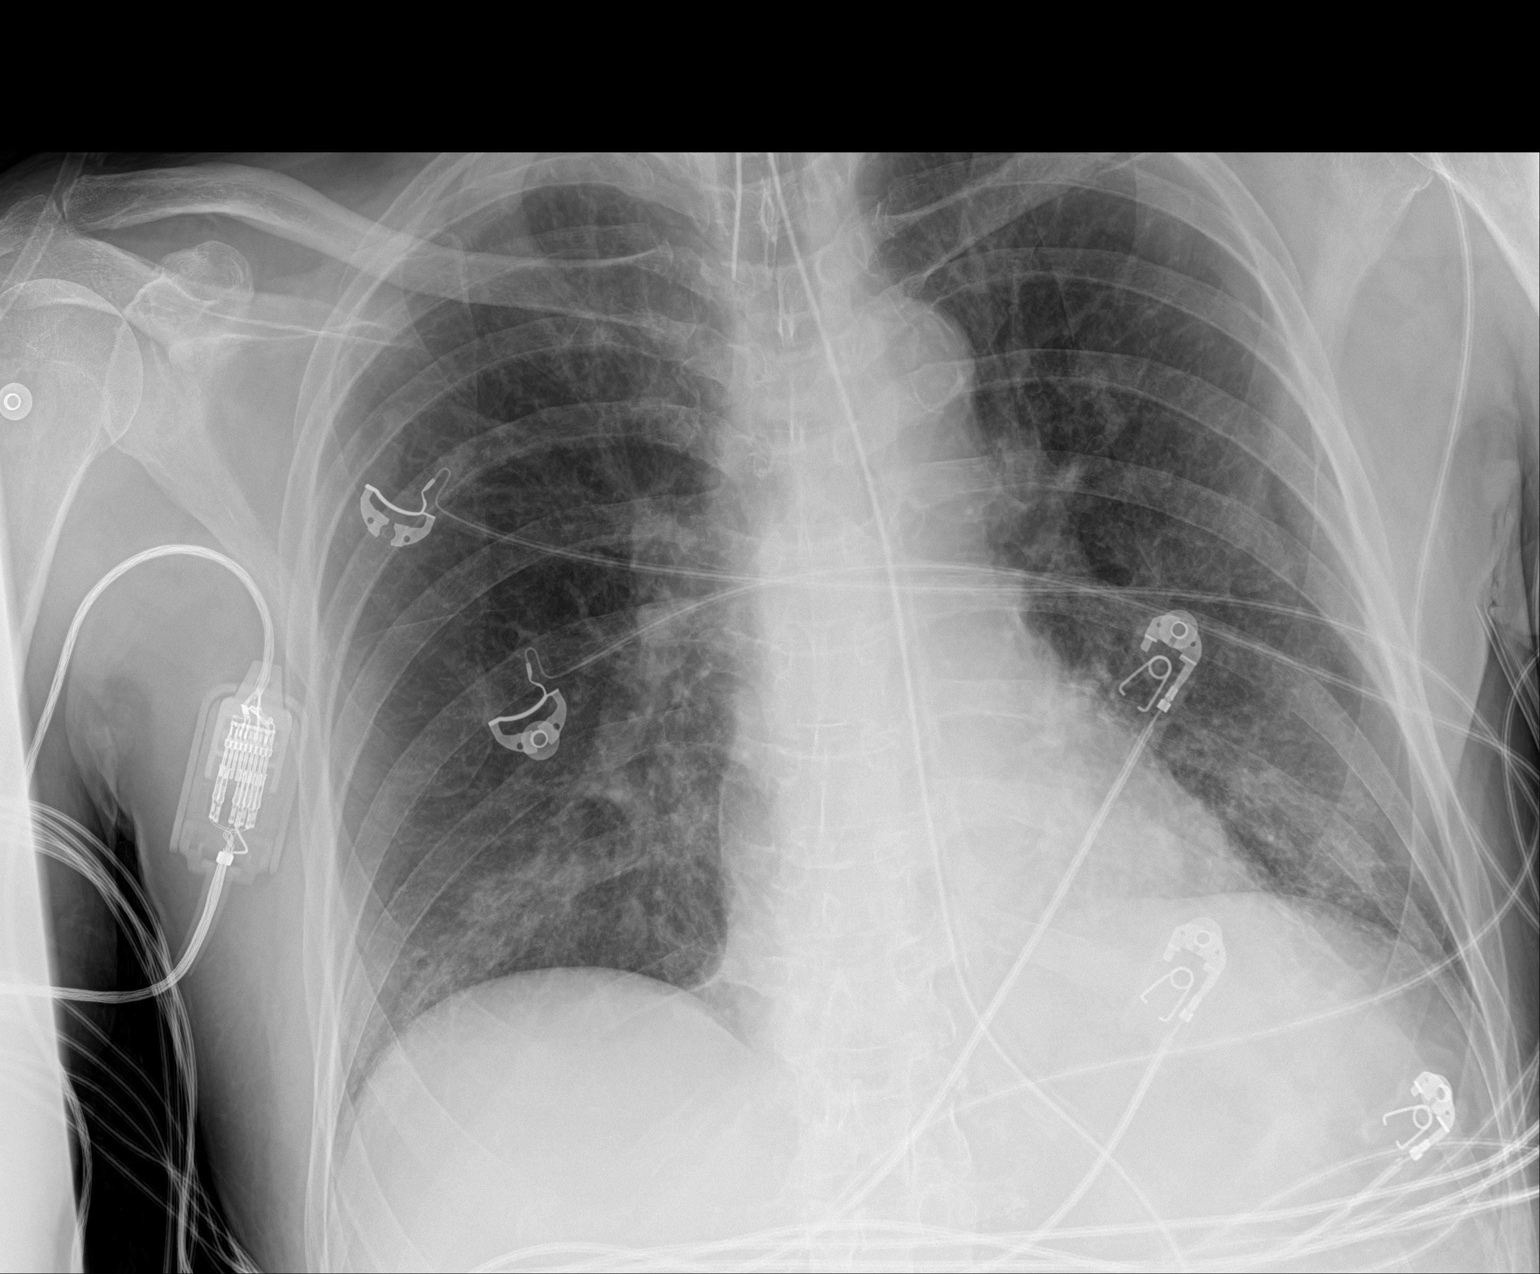

[1 of 1 positions shown; findings below may reference images not displayed]

FINDINGS: Endotracheal tube tip is at the level of the clavicular heads.
Orogastric tube tip is below the diaphragm. Bibasilar atelectasis.
Unchanged cardiomediastinal contours.
IMPRESSION: Endotracheal tube tip at the level of the clavicular heads.
